# Patient Record
Sex: Female | Born: 1935 | Race: White | Hispanic: No | Smoking: Never smoker
Health system: Southern US, Community
[De-identification: ages and names within clinical notes are randomized; demographics above are authoritative.]

## PROBLEM LIST (undated history)

## (undated) DIAGNOSIS — H3530 Unspecified macular degeneration: Secondary | ICD-10-CM

## (undated) DIAGNOSIS — C4492 Squamous cell carcinoma of skin, unspecified: Secondary | ICD-10-CM

## (undated) DIAGNOSIS — M810 Age-related osteoporosis without current pathological fracture: Secondary | ICD-10-CM

## (undated) DIAGNOSIS — E785 Hyperlipidemia, unspecified: Secondary | ICD-10-CM

## (undated) DIAGNOSIS — Z9849 Cataract extraction status, unspecified eye: Secondary | ICD-10-CM

## (undated) DIAGNOSIS — G4733 Obstructive sleep apnea (adult) (pediatric): Secondary | ICD-10-CM

## (undated) DIAGNOSIS — K219 Gastro-esophageal reflux disease without esophagitis: Secondary | ICD-10-CM

## (undated) DIAGNOSIS — I10 Essential (primary) hypertension: Secondary | ICD-10-CM

## (undated) DIAGNOSIS — M545 Low back pain: Secondary | ICD-10-CM

## (undated) DIAGNOSIS — R739 Hyperglycemia, unspecified: Secondary | ICD-10-CM

## (undated) DIAGNOSIS — N3281 Overactive bladder: Secondary | ICD-10-CM

## (undated) HISTORY — PX: TONSILLECTOMY: SHX5217

## (undated) HISTORY — DX: Gastro-esophageal reflux disease without esophagitis: K21.9

## (undated) HISTORY — DX: Age-related osteoporosis without current pathological fracture: M81.0

## (undated) HISTORY — DX: Essential (primary) hypertension: I10

## (undated) HISTORY — DX: Obstructive sleep apnea (adult) (pediatric): G47.33

## (undated) HISTORY — DX: Squamous cell carcinoma of skin, unspecified: C44.92

## (undated) HISTORY — DX: Hyperlipidemia, unspecified: E78.5

## (undated) HISTORY — DX: Overactive bladder: N32.81

## (undated) HISTORY — DX: Unspecified macular degeneration: H35.30

## (undated) HISTORY — DX: Low back pain: M54.5

## (undated) HISTORY — DX: Cataract extraction status, unspecified eye: Z98.49

## (undated) HISTORY — DX: Hyperglycemia, unspecified: R73.9

---

## 1959-08-30 HISTORY — PX: APPENDECTOMY: SHX54

## 1959-08-30 HISTORY — PX: ABDOMINAL HYSTERECTOMY: SHX81

## 1998-12-29 HISTORY — PX: MOLE REMOVAL: SHX2046

## 2015-01-29 DIAGNOSIS — C4492 Squamous cell carcinoma of skin, unspecified: Secondary | ICD-10-CM

## 2015-01-29 HISTORY — DX: Squamous cell carcinoma of skin, unspecified: C44.92

## 2015-11-01 DIAGNOSIS — N3941 Urge incontinence: Secondary | ICD-10-CM | POA: Diagnosis not present

## 2015-11-01 DIAGNOSIS — R1013 Epigastric pain: Secondary | ICD-10-CM | POA: Diagnosis not present

## 2015-11-01 DIAGNOSIS — N8111 Cystocele, midline: Secondary | ICD-10-CM | POA: Diagnosis not present

## 2015-11-29 ENCOUNTER — Encounter: Payer: Self-pay | Admitting: Nurse Practitioner

## 2015-11-29 ENCOUNTER — Non-Acute Institutional Stay: Payer: Medicare Other | Admitting: Nurse Practitioner

## 2015-11-29 VITALS — BP 138/68 | HR 79 | Temp 98.1°F | Ht 65.0 in | Wt 173.0 lb

## 2015-11-29 DIAGNOSIS — F5105 Insomnia due to other mental disorder: Secondary | ICD-10-CM | POA: Diagnosis not present

## 2015-11-29 DIAGNOSIS — K219 Gastro-esophageal reflux disease without esophagitis: Secondary | ICD-10-CM | POA: Diagnosis not present

## 2015-11-29 DIAGNOSIS — N3281 Overactive bladder: Secondary | ICD-10-CM

## 2015-11-29 DIAGNOSIS — R269 Unspecified abnormalities of gait and mobility: Secondary | ICD-10-CM

## 2015-11-29 DIAGNOSIS — F419 Anxiety disorder, unspecified: Secondary | ICD-10-CM

## 2015-11-29 DIAGNOSIS — I10 Essential (primary) hypertension: Secondary | ICD-10-CM | POA: Diagnosis not present

## 2015-12-04 DIAGNOSIS — F5105 Insomnia due to other mental disorder: Secondary | ICD-10-CM

## 2015-12-04 DIAGNOSIS — F419 Anxiety disorder, unspecified: Secondary | ICD-10-CM | POA: Insufficient documentation

## 2015-12-04 DIAGNOSIS — I10 Essential (primary) hypertension: Secondary | ICD-10-CM | POA: Insufficient documentation

## 2015-12-04 DIAGNOSIS — K219 Gastro-esophageal reflux disease without esophagitis: Secondary | ICD-10-CM | POA: Insufficient documentation

## 2015-12-04 DIAGNOSIS — N3281 Overactive bladder: Secondary | ICD-10-CM | POA: Insufficient documentation

## 2015-12-04 DIAGNOSIS — R269 Unspecified abnormalities of gait and mobility: Secondary | ICD-10-CM | POA: Insufficient documentation

## 2015-12-04 NOTE — Assessment & Plan Note (Signed)
Blood pressure is controlled, taking Valsartan HCT 160/12.5mg  daily, taking Mega 3 and ASA 325mg  for CV risk reduction, update lipid panel, CMP, Hgb a1c, TSH

## 2015-12-04 NOTE — Assessment & Plan Note (Signed)
Discourage Tylenol pm use, PT to eval and tx

## 2015-12-04 NOTE — Assessment & Plan Note (Signed)
Occasionally difficulty of falling and maintaining asleep, past Tylenol PM use associated with dizziness and wobbling gait/balance issue, recommend Lorazepam prn instead of Tylenol PM, observe the patient.

## 2015-12-04 NOTE — Assessment & Plan Note (Signed)
Urinary frequency, wearing pessary, ? Cystocele, Urology referral, continue Oxybutynin.

## 2015-12-04 NOTE — Progress Notes (Signed)
Patient ID: Jean [name], female   DOB: 1924/05/23, 81 y.o.   MRN: [id number]  Location:  clinic [place] Provider:  Marlana Latus NP  Code Status:  DNR Goals of care: Advanced Directive information Does patient have an advance directive?: Yes, Type of Advance Directive: [place];Living will  Chief Complaint  Patient presents with  . Medical Management of Chronic Issues    New Patient to get est with [facility]     HPI: Patient is a 81 y.o. female seen in the clinic at [facility] today for evaluation of gait, stated she feels off balance, wobbly, unsteady in her feet at times. Moved into [facility] [place] due to lack of self sufficiency managing living in her house independently. Hx of hysterectomy, pessary for ? Cystocele, the patient needs referral for Urology.   Review of Systems  Constitutional: Negative for fever, weight loss, malaise/fatigue and diaphoresis.  HENT: Positive for hearing loss. Negative for congestion, ear discharge, ear pain, nosebleeds, sore throat and tinnitus.        Mild  Eyes: Negative for blurred vision, double vision, photophobia, pain, discharge and redness.  Respiratory: Negative for cough, hemoptysis, sputum production, shortness of breath, wheezing and stridor.   Cardiovascular: Negative for chest pain, palpitations, orthopnea, claudication, leg swelling and PND.  Gastrointestinal: Positive for heartburn and constipation. Negative for nausea, vomiting, abdominal pain, diarrhea, blood in stool and melena.  Genitourinary: Positive for frequency. Negative for dysuria, urgency, hematuria and flank pain.       Urinary frequency, taking Oxybutynin, using pessary.   Musculoskeletal: Positive for back pain. Negative for myalgias, joint pain, falls and neck pain.  Skin: Negative for itching and rash.  Neurological: Positive for dizziness. Negative for tingling, tremors, sensory change, speech change, focal weakness, seizures, loss of consciousness, weakness  and headaches.       May associated with Tylenol PM use  Endo/Heme/Allergies: Negative for environmental allergies and polydipsia. Bruises/bleeds easily.       Taking ASA 325mg  daily.   Psychiatric/Behavioral: Negative for depression, suicidal ideas, hallucinations, memory loss and substance abuse. The patient is nervous/anxious and has insomnia.        Recommended to take prn Lorazepam instead of Tylenol PM in setting of c/o dizziness and only occasionally needed sleeping aid.     Past Medical History  Diagnosis Date  . Hypertension   . GERD (gastroesophageal reflux disease)   . Hyperlipidemia   . Overactive bladder   . Obstructive sleep apnea   . Macular degeneration     ? which eye(s)  . Osteoporosis, senile   . Squamous cell skin cancer 2/16    left lower leg  . Hx of cataract surgery     both eyes  . Hyperglycemia     Patient Active Problem List   Diagnosis Date Noted  . HTN (hypertension) 12/04/2015  . 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 GERD (gastroesophageal reflux disease) 12/04/2015  . Overactive bladder 12/04/2015  . Abnormality of gait 12/04/2015    Allergies  Allergen Reactions  . Nitrofuran Derivatives     Medications: Patient's Medications  New Prescriptions   No medications on file  Previous Medications   ASPIRIN EC 325 MG TABLET    Take 325 mg by mouth daily.   CALCIUM CARBONATE (TUMS - DOSED IN MG ELEMENTAL CALCIUM) 500 MG CHEWABLE TABLET    Chew 1 tablet by mouth. 2 tablets as needed for acid indigestion   CARBOXYMETHYLCELL-HYPROMELLOSE (GENTEAL) 0.25-0.3 % GEL  Apply to eye. Apply to eyes at bedtime for dry eyes   LORAZEPAM (ATIVAN) 0.5 MG TABLET    Take 0.5 mg by mouth. Take one tablet daily as needed for anxiety   MULTIPLE VITAMINS-MINERALS (ICAPS LUTEIN & ZEAXANTHIN PO)    Take by mouth. Take one tablet daily   NON FORMULARY    Biospec specific biological formulas supplement take 2 tablets once daily with meal   OMEGA-3 1400 MG CAPS     Take by mouth. Take three tablets daily   OMEPRAZOLE (PRILOSEC) 40 MG CAPSULE    Take 40 mg by mouth. Take one capsule 1/2 hour before breakfast for stomach acid   OXYBUTYNIN (DITROPAN) 5 MG TABLET    Take 5 mg by mouth. Take one tablet at bedtime for bladder   POLYETHYL GLYCOL-PROPYL GLYCOL (SYSTANE) 0.4-0.3 % SOLN    Apply to eye. One drop in each eye for dry eyes   RANITIDINE (ZANTAC) 150 MG TABLET    Take 150 mg by mouth. Take one tablet every 12 hours as needed for stomach   VALSARTAN-HYDROCHLOROTHIAZIDE (DIOVAN-HCT) 160-12.5 MG TABLET    Take 1 tablet by mouth daily. For blood pressure  Modified Medications   No medications on file  Discontinued Medications   No medications on file    Physical Exam: Filed Vitals:   11/29/15 1609  BP: 138/68  Pulse: 79  Temp: 98.1 F (36.7 C)  TempSrc: Oral  Height: 5\' 5"  (1.651 m)  Weight: 173 lb (78.472 kg)  SpO2: 96%   Body mass index is 28.79 kg/(m^2).  Physical Exam  Constitutional: She is oriented to person, place, and time. She appears well-developed and well-nourished. No distress.  HENT:  Head: Normocephalic and atraumatic.  Right Ear: External ear normal.  Left Ear: External ear normal.  Nose: Nose normal.  Mouth/Throat: Oropharynx is clear and moist.  Eyes: EOM are normal. Pupils are equal, round, and reactive to light. Right eye exhibits no discharge. Left eye exhibits no discharge. No scleral icterus.  Neck: Normal range of motion. Neck supple. No JVD present. No tracheal deviation present. No thyromegaly present.  Cardiovascular: Normal rate, regular rhythm, normal heart sounds and intact distal pulses.  Exam reveals no gallop and no friction rub.   No murmur heard. Pulmonary/Chest: No stridor. No respiratory distress. She has no wheezes. She has no rales. She exhibits no tenderness.  Abdominal: She exhibits no distension and no mass. There is no tenderness. There is no rebound and no guarding. No hernia.  Genitourinary:    Hx of hysterectomy Chronic pessary use  Musculoskeletal: She exhibits no edema or tenderness.  Lymphadenopathy:    She has no cervical adenopathy.  Neurological: She is alert and oriented to person, place, and time. She has normal reflexes. She displays normal reflexes. No cranial nerve deficit. She exhibits normal muscle tone. Coordination normal.  Skin: Skin is warm and dry. No rash noted. She is not diaphoretic. No erythema. No pallor.  Psychiatric: She has a normal mood and affect. Her behavior is normal. Judgment and thought content normal.  Occasional difficulty falling and maintaining asleep    Labs reviewed: Basic Metabolic Panel: No results for input(s): NA, K, CL, CO2, GLUCOSE, BUN, CREATININE, CALCIUM, MG, PHOS in the last 8760 hours.  Liver Function Tests: No results for input(s): AST, ALT, ALKPHOS, BILITOT, PROT, ALBUMIN in the last 8760 hours.  CBC: No results for input(s): WBC, NEUTROABS, HGB, HCT, MCV, PLT in the last 8760 hours.  No results  found for: TSH No results found for: HGBA1C No results found for: CHOL, HDL, LDLCALC, LDLDIRECT, TRIG, CHOLHDL  Significant Diagnostic Results since last visit: none  Patient Care Team: Estill Dooms, MD as PCP - General (Internal Medicine) Karelyn Brisby X, NP as Nurse Practitioner (Internal Medicine)  Assessment/Plan Problem List Items Addressed This Visit    HTN (hypertension) - Primary    Blood pressure is controlled, taking Valsartan HCT 160/12.5mg  daily, taking Mega 3 and ASA 325mg  for CV risk reduction, update lipid panel, CMP, Hgb a1c, TSH      Relevant Medications   valsartan-hydrochlorothiazide (DIOVAN-HCT) 160-12.5 MG tablet   aspirin EC 325 MG tablet   Insomnia secondary to anxiety    Occasionally difficulty of falling and maintaining asleep, past Tylenol PM use associated with dizziness and wobbling gait/balance issue, recommend Lorazepam prn instead of Tylenol PM, observe the patient.       Relevant  Medications   LORazepam (ATIVAN) 0.5 MG tablet   GERD (gastroesophageal reflux disease)    Asymptomatic, continue Omeprazole and prn Ranitidine.       Relevant Medications   omeprazole (PRILOSEC) 40 MG capsule   ranitidine (ZANTAC) 150 MG tablet   calcium carbonate (TUMS - DOSED IN MG ELEMENTAL CALCIUM) 500 MG chewable tablet   Overactive bladder    Urinary frequency, wearing pessary, ? Cystocele, Urology referral, continue Oxybutynin.       Abnormality of gait    Discourage Tylenol pm use, PT to eval and tx           Family/ staff Communication: PT to eval and tx for balance, gait, strengthening, therapeutic exercise.  Urology referral.   Labs/tests ordered: CBC, CMP, lipid panel, Hgb A1c, TSH  ManXie Jashua Knaak NP [facility] [address], [place] [address] On Call:  [phone] & follow prompts after 5pm & weekends Office Phone:  [phone] Office Fax:  [phone]

## 2015-12-04 NOTE — Assessment & Plan Note (Signed)
Asymptomatic, continue Omeprazole and prn Ranitidine.

## 2015-12-10 ENCOUNTER — Other Ambulatory Visit: Payer: Self-pay | Admitting: *Deleted

## 2015-12-27 ENCOUNTER — Other Ambulatory Visit: Payer: Self-pay | Admitting: *Deleted

## 2015-12-27 MED ORDER — OMEPRAZOLE 40 MG PO CPDR: DELAYED_RELEASE_CAPSULE | ORAL | Status: DC

## 2015-12-27 NOTE — Telephone Encounter (Signed)
Patient Requested to be faxed to pharmacy. 

## 2016-01-07 DIAGNOSIS — M545 Low back pain: Secondary | ICD-10-CM | POA: Diagnosis not present

## 2016-01-07 DIAGNOSIS — R2681 Unsteadiness on feet: Secondary | ICD-10-CM | POA: Diagnosis not present

## 2016-01-07 DIAGNOSIS — M6281 Muscle weakness (generalized): Secondary | ICD-10-CM | POA: Diagnosis not present

## 2016-01-15 ENCOUNTER — Telehealth: Payer: Self-pay | Admitting: *Deleted

## 2016-01-15 MED ORDER — OMEPRAZOLE 40 MG PO CPDR: DELAYED_RELEASE_CAPSULE | ORAL | Status: DC

## 2016-01-15 NOTE — Telephone Encounter (Signed)
I have not seen this patient. Schedule appointment for her to see me. Forward these questions to [facility].

## 2016-01-15 NOTE — Telephone Encounter (Signed)
Patient called requesting medication refills. Returned patient's call and she stated that she will have to call me back. Awaiting call.

## 2016-01-15 NOTE — Telephone Encounter (Signed)
Appointment scheduled with Dr Nyoka Cowden 03/27/16. Forwarded to [facility]

## 2016-01-15 NOTE — Telephone Encounter (Signed)
Patient called and wanted Omeprazole faxed to [facility]. Faxed. Also, Patient is out of the OTC vitamins that another Dr. Recommended with CoQ10 in it and the Bio Calcium and she had purchased these at his office. Since she is out of them and no longer goes to that doctor she wants to know what you would recommend OTC. Please Advise.

## 2016-01-30 DIAGNOSIS — M6281 Muscle weakness (generalized): Secondary | ICD-10-CM | POA: Diagnosis not present

## 2016-01-30 DIAGNOSIS — M545 Low back pain: Secondary | ICD-10-CM | POA: Diagnosis not present

## 2016-01-30 DIAGNOSIS — R2681 Unsteadiness on feet: Secondary | ICD-10-CM | POA: Diagnosis not present

## 2016-02-01 DIAGNOSIS — G4733 Obstructive sleep apnea (adult) (pediatric): Secondary | ICD-10-CM | POA: Diagnosis not present

## 2016-02-21 ENCOUNTER — Telehealth: Payer: Self-pay

## 2016-02-21 DIAGNOSIS — R350 Frequency of micturition: Secondary | ICD-10-CM | POA: Diagnosis not present

## 2016-02-21 DIAGNOSIS — N8110 Cystocele, unspecified: Secondary | ICD-10-CM | POA: Diagnosis not present

## 2016-02-21 MED ORDER — OXYBUTYNIN CHLORIDE 5 MG PO TABS: ORAL_TABLET | ORAL | Status: DC

## 2016-02-21 MED ORDER — VALSARTAN-HYDROCHLOROTHIAZIDE 160-12.5 MG PO TABS: 1.0000 | ORAL_TABLET | Freq: Every day | ORAL | Status: DC

## 2016-02-21 NOTE — Telephone Encounter (Signed)
Received phone call from [facility] clinic nurse, patient needs refills on Oxybutynin and Valsartan to [facility].

## 2016-02-28 DIAGNOSIS — M6281 Muscle weakness (generalized): Secondary | ICD-10-CM | POA: Diagnosis not present

## 2016-02-28 DIAGNOSIS — M545 Low back pain: Secondary | ICD-10-CM | POA: Diagnosis not present

## 2016-02-28 DIAGNOSIS — R2681 Unsteadiness on feet: Secondary | ICD-10-CM | POA: Diagnosis not present

## 2016-03-18 DIAGNOSIS — E785 Hyperlipidemia, unspecified: Secondary | ICD-10-CM | POA: Diagnosis not present

## 2016-03-18 DIAGNOSIS — R739 Hyperglycemia, unspecified: Secondary | ICD-10-CM | POA: Diagnosis not present

## 2016-03-18 DIAGNOSIS — I10 Essential (primary) hypertension: Secondary | ICD-10-CM | POA: Diagnosis not present

## 2016-03-18 LAB — BASIC METABOLIC PANEL
BUN: 28 mg/dL — AB (ref 4–21)
CREATININE: 0.8 mg/dL (ref 0.5–1.1)
Glucose: 98 mg/dL
Potassium: 4 mmol/L (ref 3.4–5.3)
SODIUM: 141 mmol/L (ref 137–147)

## 2016-03-18 LAB — HEPATIC FUNCTION PANEL
ALK PHOS: 65 U/L (ref 25–125)
ALT: 19 U/L (ref 7–35)
AST: 26 U/L (ref 13–35)
Bilirubin, Total: 0.6 mg/dL

## 2016-03-18 LAB — CBC AND DIFFERENTIAL
HEMATOCRIT: 35 % — AB (ref 36–46)
Hemoglobin: 12.1 g/dL (ref 12.0–16.0)
PLATELETS: 202 10*3/uL (ref 150–399)
WBC: 6.7 10^3/mL

## 2016-03-18 LAB — LIPID PANEL
Cholesterol: 187 mg/dL (ref 0–200)
HDL: 53 mg/dL (ref 35–70)
LDL CALC: 121 mg/dL
TRIGLYCERIDES: 66 mg/dL (ref 40–160)

## 2016-03-18 LAB — HEMOGLOBIN A1C: HEMOGLOBIN A1C: 5.7

## 2016-03-27 ENCOUNTER — Encounter: Payer: Self-pay | Admitting: Internal Medicine

## 2016-03-27 ENCOUNTER — Non-Acute Institutional Stay: Payer: PPO | Admitting: Internal Medicine

## 2016-03-27 VITALS — BP 152/74 | Ht 65.0 in | Wt 178.0 lb

## 2016-03-27 DIAGNOSIS — N3281 Overactive bladder: Secondary | ICD-10-CM | POA: Diagnosis not present

## 2016-03-27 DIAGNOSIS — I10 Essential (primary) hypertension: Secondary | ICD-10-CM

## 2016-03-27 DIAGNOSIS — E785 Hyperlipidemia, unspecified: Secondary | ICD-10-CM | POA: Diagnosis not present

## 2016-03-27 DIAGNOSIS — M545 Low back pain: Secondary | ICD-10-CM

## 2016-03-27 DIAGNOSIS — G4733 Obstructive sleep apnea (adult) (pediatric): Secondary | ICD-10-CM | POA: Diagnosis not present

## 2016-03-27 DIAGNOSIS — F419 Anxiety disorder, unspecified: Secondary | ICD-10-CM

## 2016-03-27 DIAGNOSIS — G8929 Other chronic pain: Secondary | ICD-10-CM

## 2016-03-27 DIAGNOSIS — F5105 Insomnia due to other mental disorder: Secondary | ICD-10-CM | POA: Diagnosis not present

## 2016-03-27 NOTE — Progress Notes (Signed)
Patient ID: Jean [name], female   DOB: 15-Jun-1924, 82 y.o.   MRN: [id number]  Provider:  Jeanmarie Hubert, M.D. Location:  [place] of Service:  Clinic (12)  PCP: Estill Dooms, MD Patient Care Team: Estill Dooms, MD as PCP - General (Internal Medicine) Man Mast Rhunette Croft, NP as Nurse Practitioner (Internal Medicine)  Extended Emergency Contact Information Primary Emergency Contact: [facility] Address: [address]          [place], [place] [address] Johnnette Litter of [place] Phone: [phone] Mobile Phone: [phone] Relation: Son  Code Status: full Goals of Care: Advanced Directive information Advanced Directives 03/27/2016  Does patient have an advance directive? Yes  Type of Advance Directive Healthcare Power of Attorney  Copy of advanced directive(s) in chart? Yes      Chief Complaint  Patient presents with  . Medical Management of Chronic Issues    medication  management, blood pressure, insomnia, anxiety, gait. New patient follow-up, saw [facility] 11/29/15  . Nasal Congestion    allergies, what should she take  . Back Problem    should she continue with PT or do exercise class?    HPI: Patient is a 82 y.o. female seen today for first visit with me.Patient was seen by Marlana Latus, NP on 11/29/2015. Problems considered at that time included hypertension, insomnia, GERD, overactive bladder, and abnormal gait.  Lower back pains. Had PT sessions. Would like to be considered for more PT. She is not doing any exercises on her own. Was taught Nustep. Has been trying to get her life in order otherwise. Pains come and go. Needs cart to lean on when shopping. Using 4 wheel walker since at lest Jan 2016. Had xrays from a chiropracter.   Moved to [place] from [place]. Has a son in [place].  Hx of skin cancer of cheek and left leg in the past. Got cellulitis requiring hospital stay after the removal from the leg.  Has OSA and uses CPAP since July  2015.  Past Medical History  Diagnosis Date  . Hypertension   . GERD (gastroesophageal reflux disease)   . Hyperlipidemia   . Overactive bladder   . Obstructive sleep apnea   . Macular degeneration     ? which eye(s)  . Osteoporosis, senile   . Squamous cell skin cancer 2/16    left lower leg  . Hx of cataract surgery     both eyes  . Hyperglycemia    Past Surgical History  Procedure Laterality Date  . Abdominal hysterectomy  1960's  . Appendectomy  1960's  . Mole removal  2000    facial  . Tonsillectomy      reports that she has never smoked. She has never used smokeless tobacco. She reports that she drinks alcohol. She reports that she does not use illicit drugs. Social History   Social History  . Marital Status: Widowed    Spouse Name: N/A  . Number of Children: N/A  . Years of Education: N/A   Occupational History  . 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 Smoking status: Never Smoker   . Smokeless tobacco: Never Used  . Alcohol Use: Yes     Comment: occassionally 1/2 glass of wine 1-2 x month  . Drug Use: No  . Sexual Activity: No   Other Topics Concern  . Not on file   Social History Narrative   Lives at [facility] since 11/12/15  Widow   Never smoked   Alcohol occasionally 1/2 glass of wine   Exercise none   POA, Living Will   Walks with walker        Family History  Problem Relation Age of Onset  . Heart disease Mother   . Heart disease Father     Health Maintenance  Topic Date Due  . ZOSTAVAX  01/23/1986  . DEXA SCAN  01/23/1991  . INFLUENZA VACCINE  07/29/2016  . TETANUS/TDAP  12/14/2022  . PNA vac Low Risk Adult  Completed    Allergies  Allergen Reactions  . Adhesive [Tape]   . Nitrofuran Derivatives       Medication List       This list is accurate as of: 03/27/16  3:38 PM.  Always use your most recent med list.               aspirin 325 MG tablet  Take 325 mg by mouth daily.      CALCIUM 600+D3 600-800 MG-UNIT Tabs  Generic drug:  Calcium Carb-Cholecalciferol  Take by mouth. Take one daily     CoQ10 100 MG Caps  Take by mouth. Take one tablet daily     Fish Oil 1200 MG Caps  Take by mouth. Take one daily     ICAPS Caps  Take by mouth. Take one I-Cap daily for eyes     LORazepam 0.5 MG tablet  Commonly known as:  ATIVAN  Take 0.5 mg by mouth. Take one tablet daily as needed for anxiety     multivitamin tablet  Take 1 tablet by mouth daily.     omeprazole 40 MG capsule  Commonly known as:  PRILOSEC  Take one capsule 1/2 hour before breakfast for stomach acid     oxybutynin 5 MG tablet  Commonly known as:  DITROPAN  Take one tablet at bedtime for bladder     SYSTANE 0.4-0.3 % Soln  Generic drug:  Polyethyl Glycol-Propyl Glycol  Apply to eye. One drop both eyes three times daily     SYSTANE 0.4-0.3 % Gel ophthalmic gel  Generic drug:  Polyethyl Glycol-Propyl Glycol  Place 1 application into both eyes. Apply to eye lids at bedtime     valsartan-hydrochlorothiazide 160-12.5 MG tablet  Commonly known as:  DIOVAN-HCT  Take 1 tablet by mouth daily. For blood pressure        Review of Systems  Constitutional: Negative for fever, chills, diaphoresis, activity change, appetite change, fatigue and unexpected weight change.  HENT: Negative for congestion, ear discharge, ear pain, hearing loss, postnasal drip, rhinorrhea, sore throat, tinnitus, trouble swallowing and voice change.   Eyes: Negative for pain, redness, itching and visual disturbance.       Macular degeneration  Respiratory: Negative for cough, choking, shortness of breath and wheezing.        History of obstructive sleep apnea  Cardiovascular: Negative for chest pain, palpitations and leg swelling.  Gastrointestinal: Negative for nausea, abdominal pain, diarrhea, constipation and abdominal distention.       History GERD and reflux  Endocrine: Negative for cold intolerance, heat intolerance,  polydipsia, polyphagia and polyuria.  Genitourinary: Negative for dysuria, urgency, frequency, hematuria, flank pain, vaginal discharge, difficulty urinating and pelvic pain.       Incontinent of urine. Has pessary.  Musculoskeletal: Positive for gait problem. Negative for myalgias, back pain, arthralgias, neck pain and neck stiffness.  Skin: Negative for color change, pallor and rash.  Allergic/Immunologic: Negative.  Neurological: Negative for dizziness, tremors, seizures, syncope, weakness, numbness and headaches.  Hematological: Negative for adenopathy. Does not bruise/bleed easily.  Psychiatric/Behavioral: Positive for sleep disturbance (insomnia). Negative for suicidal ideas, hallucinations, behavioral problems, confusion, dysphoric mood and agitation. The patient is nervous/anxious. The patient is not hyperactive.     Filed Vitals:   03/27/16 1527  BP: 152/74  Height: 5\' 5"  (1.651 m)  Weight: 178 lb (80.74 kg)   Body mass index is 29.62 kg/(m^2). Physical Exam  Constitutional: She is oriented to person, place, and time. She appears well-developed and well-nourished. No distress.  HENT:  Right Ear: External ear normal.  Left Ear: External ear normal.  Nose: Nose normal.  Mouth/Throat: Oropharynx is clear and moist. No oropharyngeal exudate.  Eyes: Conjunctivae and EOM are normal. Pupils are equal, round, and reactive to light. No scleral icterus.  Neck: No JVD present. No tracheal deviation present. No thyromegaly present.  Cardiovascular: Normal rate, regular rhythm, normal heart sounds and intact distal pulses.  Exam reveals no gallop and no friction rub.   No murmur heard. Pulmonary/Chest: Effort normal. No respiratory distress. She has no wheezes. She has no rales. She exhibits no tenderness.  Abdominal: She exhibits no distension and no mass. There is no tenderness.  Musculoskeletal: Normal range of motion. She exhibits no edema or tenderness.  Tender in lower  back. Unstable gait  Lymphadenopathy:    She has no cervical adenopathy.  Neurological: She is alert and oriented to person, place, and time. No cranial nerve deficit. Coordination normal.  Skin: No rash noted. She is not diaphoretic. No erythema. No pallor.  Psychiatric: She has a normal mood and affect. Her behavior is normal. Judgment and thought content normal.    Labs reviewed: Basic Metabolic Panel:  Recent Labs  03/18/16  NA 141  K 4.0  BUN 28*  CREATININE 0.8   Liver Function Tests:  Recent Labs  03/18/16  AST 26  ALT 19  ALKPHOS 65   No results for input(s): LIPASE, AMYLASE in the last 8760 hours. No results for input(s): AMMONIA in the last 8760 hours. CBC:  Recent Labs  03/18/16  WBC 6.7  HGB 12.1  HCT 35*  PLT 202   Cardiac Enzymes: No results for input(s): CKTOTAL, CKMB, CKMBINDEX, TROPONINI in the last 8760 hours. BNP: Invalid input(s): POCBNP Lab Results  Component Value Date   HGBA1C 5.7 03/18/2016     Assessment/Plan 1. Chronic midline low back pain without sciatica I recommended continuation of physical therapy.  2. Obstructive sleep apnea Continue CPAP  3. Hyperlipidemia -Lipid panel, future  4. Overactive bladder Has appointment with [facility]  5. Insomnia secondary to anxiety Try melatonin 3 mg at bedtime  6. Essential hypertension Although there is a modest elevation in systolic blood pressure today, I elected not to change medications. This may be attributable to see me on the first time basis today re-evaluate next visit.

## 2016-04-02 DIAGNOSIS — M545 Low back pain: Secondary | ICD-10-CM | POA: Diagnosis not present

## 2016-04-02 DIAGNOSIS — M6281 Muscle weakness (generalized): Secondary | ICD-10-CM | POA: Diagnosis not present

## 2016-04-02 DIAGNOSIS — R2681 Unsteadiness on feet: Secondary | ICD-10-CM | POA: Diagnosis not present

## 2016-04-02 DIAGNOSIS — N8110 Cystocele, unspecified: Secondary | ICD-10-CM | POA: Diagnosis not present

## 2016-04-05 ENCOUNTER — Encounter: Payer: Self-pay | Admitting: Internal Medicine

## 2016-04-05 DIAGNOSIS — M545 Low back pain, unspecified: Secondary | ICD-10-CM | POA: Insufficient documentation

## 2016-04-05 DIAGNOSIS — E785 Hyperlipidemia, unspecified: Secondary | ICD-10-CM | POA: Insufficient documentation

## 2016-04-05 DIAGNOSIS — G4733 Obstructive sleep apnea (adult) (pediatric): Secondary | ICD-10-CM | POA: Insufficient documentation

## 2016-04-05 HISTORY — DX: Low back pain, unspecified: M54.50

## 2016-04-09 NOTE — Addendum Note (Signed)
Addended by: Estill Dooms on: 04/09/2016 05:06 PM   Modules accepted: Level of Service

## 2016-04-17 ENCOUNTER — Encounter: Payer: Self-pay | Admitting: Nurse Practitioner

## 2016-04-17 ENCOUNTER — Non-Acute Institutional Stay (SKILLED_NURSING_FACILITY): Payer: PPO | Admitting: Nurse Practitioner

## 2016-04-17 VITALS — BP 124/72 | HR 94 | Temp 98.0°F | Ht 65.0 in | Wt 176.8 lb

## 2016-04-17 DIAGNOSIS — F419 Anxiety disorder, unspecified: Secondary | ICD-10-CM

## 2016-04-17 DIAGNOSIS — N3281 Overactive bladder: Secondary | ICD-10-CM | POA: Diagnosis not present

## 2016-04-17 DIAGNOSIS — I10 Essential (primary) hypertension: Secondary | ICD-10-CM

## 2016-04-17 DIAGNOSIS — F5105 Insomnia due to other mental disorder: Secondary | ICD-10-CM

## 2016-04-17 DIAGNOSIS — R531 Weakness: Secondary | ICD-10-CM | POA: Insufficient documentation

## 2016-04-17 DIAGNOSIS — R5383 Other fatigue: Secondary | ICD-10-CM | POA: Diagnosis not present

## 2016-04-17 DIAGNOSIS — K219 Gastro-esophageal reflux disease without esophagitis: Secondary | ICD-10-CM | POA: Diagnosis not present

## 2016-04-17 NOTE — Assessment & Plan Note (Signed)
Stable, continue Omeprazole 40mg daily.  

## 2016-04-17 NOTE — Progress Notes (Signed)
Patient ID: Jean [name], female   DOB: Feb 19, 1924, 82 y.o.   MRN: [id number]   Location:  [facility] (12) Provider: Marlana Latus NP  Code Status: DNR Goals of Care:  Advanced Directives 04/17/2016  Does patient have an advance directive? Yes  Type of Advance Directive Healthcare Power of Attorney  Copy of advanced directive(s) in chart? Yes     Chief Complaint  Patient presents with  . Acute Visit    patient states she feel sick and weak  also sweating alot    HPI: Patient is a 82 y.o. female seen today for an acute visit for quick onset fatigue, nausea, weakness, sweating, anxiety, recent changed pessary at Urology, afebrile, denied abd pain or dysuria, admitted urinary frequency. Hx of overactive bladder, taking Ditropan 5mg  daily. Blood pressure is controlled on Diovan-Hct 160/12.5mg . GERD stable on Omeprazole 40mg  daily.   Past Medical History  Diagnosis Date  . Hypertension   . GERD (gastroesophageal reflux disease)   . Hyperlipidemia   . Overactive bladder   . Obstructive sleep apnea   . Macular degeneration     ? which eye(s)  . Osteoporosis, senile   . Squamous cell skin cancer 2/16    left lower leg  . Hx of cataract surgery     both eyes  . Hyperglycemia   . Lumbago 04/05/2016    Past Surgical History  Procedure Laterality Date  . Abdominal hysterectomy  1960's  . Appendectomy  1960's  . Mole removal  2000    facial  . Tonsillectomy      Allergies  Allergen Reactions  . Adhesive [Tape]   . Nitrofuran Derivatives       Medication List       This list is accurate as of: 04/17/16  3:27 PM.  Always use your most recent med list.               aspirin 325 MG tablet  Take 325 mg by mouth daily.     CALCIUM 600+D3 600-800 MG-UNIT Tabs  Generic drug:  Calcium Carb-Cholecalciferol  Take by mouth. Take one daily     CoQ10 100 MG Caps  Take by mouth. Take one tablet daily     Fish Oil 1200 MG Caps  Take by  mouth. Take one daily     ICAPS Caps  Take by mouth. Take one I-Cap daily for eyes     LORazepam 0.5 MG tablet  Commonly known as:  ATIVAN  Take 0.5 mg by mouth. Take one tablet daily as needed for anxiety     multivitamin tablet  Take 1 tablet by mouth daily.     omeprazole 40 MG capsule  Commonly known as:  PRILOSEC  Take one capsule 1/2 hour before breakfast for stomach acid     oxybutynin 5 MG tablet  Commonly known as:  DITROPAN  Take one tablet at bedtime for bladder     SYSTANE 0.4-0.3 % Soln  Generic drug:  Polyethyl Glycol-Propyl Glycol  Apply to eye. One drop both eyes three times daily     SYSTANE 0.4-0.3 % Gel ophthalmic gel  Generic drug:  Polyethyl Glycol-Propyl Glycol  Place 1 application into both eyes. Apply to eye lids at bedtime     valsartan-hydrochlorothiazide 160-12.5 MG tablet  Commonly known as:  DIOVAN-HCT  Take 1 tablet by mouth daily. For blood pressure        Review of Systems:  Review  of Systems  Constitutional: Negative for fever, chills, diaphoresis, activity change, appetite change, fatigue and unexpected weight change.       Fatigue, nausea, weakness, sweating, anxiety  HENT: Negative for congestion, ear discharge, ear pain, hearing loss, postnasal drip, rhinorrhea, sore throat, tinnitus, trouble swallowing and voice change.   Eyes: Negative for pain, redness, itching and visual disturbance.       Macular degeneration  Respiratory: Negative for cough, choking, shortness of breath and wheezing.        History of obstructive sleep apnea  Cardiovascular: Negative for chest pain, palpitations and leg swelling.  Gastrointestinal: Negative for nausea, abdominal pain, diarrhea, constipation and abdominal distention.       History GERD and reflux  Endocrine: Negative for cold intolerance, heat intolerance, polydipsia, polyphagia and polyuria.  Genitourinary: Negative for dysuria, urgency, frequency, hematuria, flank pain, vaginal discharge,  difficulty urinating and pelvic pain.       Incontinent of urine. Has pessary.  Musculoskeletal: Positive for gait problem. Negative for myalgias, back pain, arthralgias, neck pain and neck stiffness.  Skin: Negative for color change, pallor and rash.  Allergic/Immunologic: Negative.   Neurological: Negative for dizziness, tremors, seizures, syncope, weakness, numbness and headaches.  Hematological: Negative for adenopathy. Does not bruise/bleed easily.  Psychiatric/Behavioral: Positive for sleep disturbance (insomnia). Negative for suicidal ideas, hallucinations, behavioral problems, confusion, dysphoric mood and agitation. The patient is nervous/anxious. The patient is not hyperactive.     Health Maintenance  Topic Date Due  . ZOSTAVAX  01/23/1986  . DEXA SCAN  01/23/1991  . INFLUENZA VACCINE  07/29/2016  . TETANUS/TDAP  12/14/2022  . PNA vac Low Risk Adult  Completed    Physical Exam: Filed Vitals:   04/17/16 1458  BP: 124/72  Pulse: 94  Temp: 98 F (36.7 C)  TempSrc: Oral  Height: 5\' 5"  (1.651 m)  Weight: 176 lb 12.8 oz (80.196 kg)  SpO2: 96%   Body mass index is 29.42 kg/(m^2). Physical Exam  Constitutional: She is oriented to person, place, and time. She appears well-developed and well-nourished. No distress.  HENT:  Right Ear: External ear normal.  Left Ear: External ear normal.  Nose: Nose normal.  Mouth/Throat: Oropharynx is clear and moist. No oropharyngeal exudate.  Eyes: Conjunctivae and EOM are normal. Pupils are equal, round, and reactive to light. No scleral icterus.  Neck: No JVD present. No tracheal deviation present. No thyromegaly present.  Cardiovascular: Normal rate, regular rhythm, normal heart sounds and intact distal pulses.  Exam reveals no gallop and no friction rub.   No murmur heard. Pulmonary/Chest: Effort normal. No respiratory distress. She has no wheezes. She has no rales. She exhibits no tenderness.  Abdominal: She exhibits no distension  and no mass. There is no tenderness.  Musculoskeletal: Normal range of motion. She exhibits no edema or tenderness.  Tender in lower back. Unstable gait  Lymphadenopathy:    She has no cervical adenopathy.  Neurological: She is alert and oriented to person, place, and time. No cranial nerve deficit. Coordination normal.  Skin: No rash noted. She is not diaphoretic. No erythema. No pallor.  Psychiatric: She has a normal mood and affect. Her behavior is normal. Judgment and thought content normal.    Labs reviewed: Basic Metabolic Panel:  Recent Labs  03/18/16  NA 141  K 4.0  BUN 28*  CREATININE 0.8   Liver Function Tests:  Recent Labs  03/18/16  AST 26  ALT 19  ALKPHOS 65   No results for  input(s): LIPASE, AMYLASE in the last 8760 hours. No results for input(s): AMMONIA in the last 8760 hours. CBC:  Recent Labs  03/18/16  WBC 6.7  HGB 12.1  HCT 35*  PLT 202   Lipid Panel:  Recent Labs  03/18/16  CHOL 187  HDL 53  LDLCALC 121  TRIG 66   Lab Results  Component Value Date   HGBA1C 5.7 03/18/2016    Procedures since last visit: No results found.  Assessment/Plan GERD (gastroesophageal reflux disease) Stable, continue Omeprazole 40mg  daily  Overactive bladder Urinary frequency, continue Pessary and Ditropan, obtain UA C/S in setting of sudden onset of fatigue, sweating, nausea, anxiety.   Insomnia secondary to anxiety Worsened since onset of fatigue, sweating, nausea. Update CBC, CMP, TSH, continue Lorazepam 0.5mg  prn daily   HTN (hypertension) Blood pressure is controlled, taking Valsartan HCT 160/12.5mg  daily, taking Mega 3 and ASA 325mg  for CV risk reduction, CMP, TSH   Fatigue Sudden onset of fatigue, nausea, sweating, worsened anxiety in setting of recent pessary change and chronic urinary frequency, obtain UA c/s to r/o UTI. Update CBC, CMP, TSH     Labs/tests ordered:  @ORDERS @ CBC, CMP, TSH, UA C/S Next appt:  06/19/2016

## 2016-04-17 NOTE — Assessment & Plan Note (Signed)
Sudden onset of fatigue, nausea, sweating, worsened anxiety in setting of recent pessary change and chronic urinary frequency, obtain UA c/s to r/o UTI. Update CBC, CMP, TSH

## 2016-04-17 NOTE — Assessment & Plan Note (Signed)
Blood pressure is controlled, taking Valsartan HCT 160/12.5mg  daily, taking Mega 3 and ASA 325mg  for CV risk reduction, CMP, TSH

## 2016-04-17 NOTE — Assessment & Plan Note (Signed)
Worsened since onset of fatigue, sweating, nausea. Update CBC, CMP, TSH, continue Lorazepam 0.5mg  prn daily

## 2016-04-17 NOTE — Assessment & Plan Note (Signed)
Urinary frequency, continue Pessary and Ditropan, obtain UA C/S in setting of sudden onset of fatigue, sweating, nausea, anxiety.

## 2016-04-18 DIAGNOSIS — R509 Fever, unspecified: Secondary | ICD-10-CM | POA: Diagnosis not present

## 2016-04-18 DIAGNOSIS — I10 Essential (primary) hypertension: Secondary | ICD-10-CM | POA: Diagnosis not present

## 2016-04-22 ENCOUNTER — Telehealth: Payer: Self-pay

## 2016-04-22 ENCOUNTER — Telehealth: Payer: Self-pay | Admitting: *Deleted

## 2016-04-22 DIAGNOSIS — G4733 Obstructive sleep apnea (adult) (pediatric): Secondary | ICD-10-CM | POA: Diagnosis not present

## 2016-04-22 MED ORDER — CIPROFLOXACIN HCL 500 MG PO TABS: ORAL_TABLET | ORAL | Status: DC

## 2016-04-22 NOTE — Telephone Encounter (Signed)
Received fax from [facility] of patient's Urine Culture Results. Given to Dr. Nyoka Cowden to review and sign.

## 2016-04-22 NOTE — Telephone Encounter (Signed)
Urine culture 100,00 colonies Klebsiella pneumoniae, called home and cell phone, no answer, called [facility] spoke with AL nurse, she will go to patient's apartment and let her know we are faxing Rx Cipro 500mg  twice daily #20 for infection and will leave Apartment Nurse message. Fax to [place] and fax urine culture to [facility] to clinic nurse

## 2016-04-25 DIAGNOSIS — L814 Other melanin hyperpigmentation: Secondary | ICD-10-CM | POA: Diagnosis not present

## 2016-04-25 DIAGNOSIS — L570 Actinic keratosis: Secondary | ICD-10-CM | POA: Diagnosis not present

## 2016-04-25 DIAGNOSIS — D225 Melanocytic nevi of trunk: Secondary | ICD-10-CM | POA: Diagnosis not present

## 2016-04-25 DIAGNOSIS — L918 Other hypertrophic disorders of the skin: Secondary | ICD-10-CM | POA: Diagnosis not present

## 2016-04-25 DIAGNOSIS — L821 Other seborrheic keratosis: Secondary | ICD-10-CM | POA: Diagnosis not present

## 2016-04-28 ENCOUNTER — Telehealth: Payer: Self-pay | Admitting: *Deleted

## 2016-04-28 DIAGNOSIS — M6281 Muscle weakness (generalized): Secondary | ICD-10-CM | POA: Diagnosis not present

## 2016-04-28 DIAGNOSIS — M545 Low back pain: Secondary | ICD-10-CM | POA: Diagnosis not present

## 2016-04-28 DIAGNOSIS — R2681 Unsteadiness on feet: Secondary | ICD-10-CM | POA: Diagnosis not present

## 2016-04-28 NOTE — Telephone Encounter (Signed)
Patient had a positive urine for Klebsiella pneumonia. Cipro was ordered to treat this. 3 days maximum and left to treat it. If she is symptom-free that I think she should not be taking another antibiotic at this time. I'm unable to find the report on the urine in the [facility] flank record. Can we obtain a copy of this?

## 2016-04-28 NOTE — Telephone Encounter (Signed)
Called [facility] and LM on the [place] Nurse's voicemail [phone] that we needed a copy of the urine culture for patient.  Patient notified and awaiting results.

## 2016-04-28 NOTE — Telephone Encounter (Signed)
Patient called and stated that she is having a reaction to Cipro. Stated that she has taken it for 3 days and it is causing her to feel Nausea and a heavy feeling on her chest. The nurses at [facility] instructed her to stop the medication and call the office. Please Advise.  (can leave message on voicemail)

## 2016-04-29 NOTE — Telephone Encounter (Signed)
Fax received with report that we faxed to [facility]. Faxed placed in urgent/time sensitive folder for review

## 2016-04-30 ENCOUNTER — Encounter: Payer: Self-pay | Admitting: Internal Medicine

## 2016-05-01 ENCOUNTER — Encounter: Payer: Self-pay | Admitting: Internal Medicine

## 2016-05-20 DIAGNOSIS — R5383 Other fatigue: Secondary | ICD-10-CM | POA: Diagnosis not present

## 2016-05-20 DIAGNOSIS — I10 Essential (primary) hypertension: Secondary | ICD-10-CM | POA: Diagnosis not present

## 2016-05-20 LAB — BASIC METABOLIC PANEL
BUN: 21 mg/dL (ref 4–21)
Creatinine: 0.8 mg/dL (ref 0.5–1.1)
Glucose: 87 mg/dL
Potassium: 4.2 mmol/L (ref 3.4–5.3)
Sodium: 139 mmol/L (ref 137–147)

## 2016-05-20 LAB — CBC AND DIFFERENTIAL
HEMATOCRIT: 38 % (ref 36–46)
Hemoglobin: 12.2 g/dL (ref 12.0–16.0)
PLATELETS: 194 10*3/uL (ref 150–399)
WBC: 5.7 10*3/mL

## 2016-05-20 LAB — HEPATIC FUNCTION PANEL
ALT: 19 U/L (ref 7–35)
AST: 24 U/L (ref 13–35)
Alkaline Phosphatase: 60 U/L (ref 25–125)
BILIRUBIN, TOTAL: 0.5 mg/dL

## 2016-05-20 LAB — TSH: TSH: 4.3 u[IU]/mL (ref 0.41–5.90)

## 2016-05-21 ENCOUNTER — Encounter: Payer: Self-pay | Admitting: *Deleted

## 2016-05-29 ENCOUNTER — Encounter: Payer: Self-pay | Admitting: Nurse Practitioner

## 2016-05-29 ENCOUNTER — Non-Acute Institutional Stay: Payer: PPO | Admitting: Nurse Practitioner

## 2016-05-29 VITALS — BP 142/72 | HR 83 | Temp 98.7°F | Resp 20 | Ht 65.0 in | Wt 182.4 lb

## 2016-05-29 DIAGNOSIS — F419 Anxiety disorder, unspecified: Secondary | ICD-10-CM

## 2016-05-29 DIAGNOSIS — K219 Gastro-esophageal reflux disease without esophagitis: Secondary | ICD-10-CM

## 2016-05-29 DIAGNOSIS — R5382 Chronic fatigue, unspecified: Secondary | ICD-10-CM | POA: Diagnosis not present

## 2016-05-29 DIAGNOSIS — F5105 Insomnia due to other mental disorder: Secondary | ICD-10-CM

## 2016-05-29 DIAGNOSIS — N3281 Overactive bladder: Secondary | ICD-10-CM | POA: Diagnosis not present

## 2016-05-29 DIAGNOSIS — R319 Hematuria, unspecified: Secondary | ICD-10-CM | POA: Diagnosis not present

## 2016-05-29 DIAGNOSIS — N390 Urinary tract infection, site not specified: Secondary | ICD-10-CM | POA: Diagnosis not present

## 2016-05-29 DIAGNOSIS — I10 Essential (primary) hypertension: Secondary | ICD-10-CM

## 2016-05-29 NOTE — Progress Notes (Signed)
Patient ID: Jean [name], female   DOB: Mar 15, 1924, 82 y.o.   MRN: [id number]   Location:   clinic [place]   Place of Service:   clinic [place]  Provider: Marlana Latus NP  Code Status: DNR Goals of Care:  Advanced Directives 05/29/2016  Does patient have an advance directive? Yes  Type of Advance Directive [place]  Does patient want to make changes to advanced directive? No - Patient declined  Copy of advanced directive(s) in chart? Yes     Chief Complaint  Patient presents with  . Medical Management of Chronic Issues    Discuss lab results.    HPI: Patient is a 82 y.o. female seen today for an acute visit for quick onset fatigue, nausea, weakness, sweating, anxiety, recent changed pessary at Urology, afebrile, denied abd pain or dysuria, admitted urinary frequency. Hx of overactive bladder, taking Ditropan 5mg  daily. Blood pressure is controlled on Diovan-Hct 160/12.5mg . GERD stable on Omeprazole 40mg  daily.   04/21/16 urine culture K. Pneumoniae, completed 3/10 day course of Cipro, asymptomatic today.   Past Medical History  Diagnosis Date  . Hypertension   . GERD (gastroesophageal reflux disease)   . Hyperlipidemia   . Overactive bladder   . Obstructive sleep apnea   . Macular degeneration     ? which eye(s)  . Osteoporosis, senile   . Squamous cell skin cancer 2/16    left lower leg  . Hx of cataract surgery     both eyes  . Hyperglycemia   . Lumbago 04/05/2016    Past Surgical History  Procedure Laterality Date  . Abdominal hysterectomy  1960's  . Appendectomy  1960's  . Mole removal  2000    facial  . Tonsillectomy      Allergies  Allergen Reactions  . Adhesive [Tape]   . Ciprofloxacin Other (See Comments)  . Nitrofuran Derivatives       Medication List       This list is accurate as of: 05/29/16  2:00 PM.  Always use your most recent med list.               aspirin 325 MG tablet  Take 325 mg by mouth daily.     CALCIUM 600+D3  600-800 MG-UNIT Tabs  Generic drug:  Calcium Carb-Cholecalciferol  Take by mouth. Take one daily     CoQ10 100 MG Caps  Take by mouth. Take one tablet daily     Fish Oil 1200 MG Caps  Take by mouth. Take one daily     ICAPS Caps  Take by mouth. Take one I-Cap daily for eyes     LORazepam 0.5 MG tablet  Commonly known as:  ATIVAN  Take 0.5 mg by mouth. Take one tablet daily as needed for anxiety     multivitamin tablet  Take 1 tablet by mouth daily.     omeprazole 40 MG capsule  Commonly known as:  PRILOSEC  Take one capsule 1/2 hour before breakfast for stomach acid     oxybutynin 5 MG tablet  Commonly known as:  DITROPAN  Take one tablet at bedtime for bladder     SYSTANE 0.4-0.3 % Soln  Generic drug:  Polyethyl Glycol-Propyl Glycol  Apply to eye. One drop both eyes three times daily     SYSTANE 0.4-0.3 % Gel ophthalmic gel  Generic drug:  Polyethyl Glycol-Propyl Glycol  Place 1 application into both eyes. Apply to eye lids at bedtime  valsartan-hydrochlorothiazide 160-12.5 MG tablet  Commonly known as:  DIOVAN-HCT  Take 1 tablet by mouth daily. For blood pressure        Review of Systems:  Review of Systems  Constitutional: Negative for fever, chills, diaphoresis, activity change, appetite change, fatigue and unexpected weight change.       Fatigue, nausea, weakness, sweating, anxiety  HENT: Negative for congestion, ear discharge, ear pain, hearing loss, postnasal drip, rhinorrhea, sore throat, tinnitus, trouble swallowing and voice change.   Eyes: Negative for pain, redness, itching and visual disturbance.       Macular degeneration  Respiratory: Negative for cough, choking, shortness of breath and wheezing.        History of obstructive sleep apnea  Cardiovascular: Negative for chest pain, palpitations and leg swelling.  Gastrointestinal: Negative for nausea, abdominal pain, diarrhea, constipation and abdominal distention.       History GERD and reflux    Endocrine: Negative for cold intolerance, heat intolerance, polydipsia, polyphagia and polyuria.  Genitourinary: Negative for dysuria, urgency, frequency, hematuria, flank pain, vaginal discharge, difficulty urinating and pelvic pain.       Incontinent of urine. Has pessary.  Musculoskeletal: Positive for gait problem. Negative for myalgias, back pain, arthralgias, neck pain and neck stiffness.  Skin: Negative for color change, pallor and rash.  Allergic/Immunologic: Negative.   Neurological: Negative for dizziness, tremors, seizures, syncope, weakness, numbness and headaches.  Hematological: Negative for adenopathy. Does not bruise/bleed easily.  Psychiatric/Behavioral: Positive for sleep disturbance (insomnia). Negative for suicidal ideas, hallucinations, behavioral problems, confusion, dysphoric mood and agitation. The patient is nervous/anxious. The patient is not hyperactive.     Health Maintenance  Topic Date Due  . ZOSTAVAX  01/23/1986  . DEXA SCAN  01/23/1991  . INFLUENZA VACCINE  07/29/2016  . TETANUS/TDAP  12/14/2022  . PNA vac Low Risk Adult  Completed    Physical Exam: Filed Vitals:   05/29/16 1329  BP: 142/72  Pulse: 83  Temp: 98.7 F (37.1 C)  TempSrc: Oral  Resp: 20  Height: 5\' 5"  (1.651 m)  Weight: 182 lb 6.4 oz (82.736 kg)  SpO2: 98%   Body mass index is 30.35 kg/(m^2). Physical Exam  Constitutional: She is oriented to person, place, and time. She appears well-developed and well-nourished. No distress.  HENT:  Right Ear: External ear normal.  Left Ear: External ear normal.  Nose: Nose normal.  Mouth/Throat: Oropharynx is clear and moist. No oropharyngeal exudate.  Eyes: Conjunctivae and EOM are normal. Pupils are equal, round, and reactive to light. No scleral icterus.  Neck: No JVD present. No tracheal deviation present. No thyromegaly present.  Cardiovascular: Normal rate, regular rhythm, normal heart sounds and intact distal pulses.  Exam reveals no  gallop and no friction rub.   No murmur heard. Pulmonary/Chest: Effort normal. No respiratory distress. She has no wheezes. She has no rales. She exhibits no tenderness.  Abdominal: She exhibits no distension and no mass. There is no tenderness.  Musculoskeletal: Normal range of motion. She exhibits no edema or tenderness.  Tender in lower back. Unstable gait  Lymphadenopathy:    She has no cervical adenopathy.  Neurological: She is alert and oriented to person, place, and time. No cranial nerve deficit. Coordination normal.  Skin: No rash noted. She is not diaphoretic. No erythema. No pallor.  Psychiatric: She has a normal mood and affect. Her behavior is normal. Judgment and thought content normal.    Labs reviewed: Basic Metabolic Panel:  Recent Labs  03/18/16  05/20/16  NA 141 139  K 4.0 4.2  BUN 28* 21  CREATININE 0.8 0.8  TSH  --  4.30   Liver Function Tests:  Recent Labs  03/18/16 05/20/16  AST 26 24  ALT 19 19  ALKPHOS 65 60   No results for input(s): LIPASE, AMYLASE in the last 8760 hours. No results for input(s): AMMONIA in the last 8760 hours. CBC:  Recent Labs  03/18/16 05/20/16  WBC 6.7 5.7  HGB 12.1 12.2  HCT 35* 38  PLT 202 194   Lipid Panel:  Recent Labs  03/18/16  CHOL 187  HDL 53  LDLCALC 121  TRIG 66   Lab Results  Component Value Date   HGBA1C 5.7 03/18/2016    Procedures since last visit: No results found.  Assessment/Plan HTN (hypertension) Blood pressure is controlled, taking Valsartan HCT 160/12.5mg  daily, taking Mega 3 and ASA 325mg  for CV risk reduction. 05/20/16 Na 139, K 4.2, Bun 21, creat 0/78  GERD (gastroesophageal reflux disease) Stable, continue Omeprazole 40mg  daily, prn Mylanta  Overactive bladder 1-2/night, continue Ditropan 5mg  qhs  Insomnia secondary to anxiety  continue Lorazepam 0.5mg  prn daily   Fatigue Chronic, TSH 4.3 05/20/16     Labs/tests ordered:  @ORDERS @ CBC, CMP, TSH done 05/20/16  Next  appt:  06/19/2016

## 2016-05-29 NOTE — Assessment & Plan Note (Signed)
Blood pressure is controlled, taking Valsartan HCT 160/12.5mg  daily, taking Mega 3 and ASA 325mg  for CV risk reduction. 05/20/16 Na 139, K 4.2, Bun 21, creat 0/78

## 2016-05-29 NOTE — Assessment & Plan Note (Signed)
continue Lorazepam 0.5mg  prn daily

## 2016-05-29 NOTE — Assessment & Plan Note (Signed)
Chronic, TSH 4.3 05/20/16

## 2016-05-29 NOTE — Assessment & Plan Note (Signed)
Stable, continue Omeprazole 40mg daily, prn Mylanta 

## 2016-05-29 NOTE — Assessment & Plan Note (Signed)
1-2/night, continue Ditropan 5mg qhs 

## 2016-05-30 DIAGNOSIS — M6281 Muscle weakness (generalized): Secondary | ICD-10-CM | POA: Diagnosis not present

## 2016-05-30 DIAGNOSIS — G4733 Obstructive sleep apnea (adult) (pediatric): Secondary | ICD-10-CM | POA: Diagnosis not present

## 2016-05-30 DIAGNOSIS — R2681 Unsteadiness on feet: Secondary | ICD-10-CM | POA: Diagnosis not present

## 2016-05-30 DIAGNOSIS — M545 Low back pain: Secondary | ICD-10-CM | POA: Diagnosis not present

## 2016-06-17 DIAGNOSIS — N390 Urinary tract infection, site not specified: Secondary | ICD-10-CM | POA: Diagnosis not present

## 2016-06-19 ENCOUNTER — Encounter: Payer: Self-pay | Admitting: Internal Medicine

## 2016-07-03 ENCOUNTER — Non-Acute Institutional Stay: Payer: PPO | Admitting: Internal Medicine

## 2016-07-03 ENCOUNTER — Encounter: Payer: Self-pay | Admitting: Internal Medicine

## 2016-07-03 VITALS — BP 136/66 | HR 76 | Temp 98.0°F | Ht 65.0 in | Wt 180.0 lb

## 2016-07-03 DIAGNOSIS — F5105 Insomnia due to other mental disorder: Secondary | ICD-10-CM

## 2016-07-03 DIAGNOSIS — G8929 Other chronic pain: Secondary | ICD-10-CM

## 2016-07-03 DIAGNOSIS — I10 Essential (primary) hypertension: Secondary | ICD-10-CM | POA: Diagnosis not present

## 2016-07-03 DIAGNOSIS — H9193 Unspecified hearing loss, bilateral: Secondary | ICD-10-CM

## 2016-07-03 DIAGNOSIS — M545 Low back pain: Secondary | ICD-10-CM

## 2016-07-03 DIAGNOSIS — F419 Anxiety disorder, unspecified: Secondary | ICD-10-CM | POA: Diagnosis not present

## 2016-07-03 DIAGNOSIS — H9190 Unspecified hearing loss, unspecified ear: Secondary | ICD-10-CM | POA: Insufficient documentation

## 2016-07-03 DIAGNOSIS — R5382 Chronic fatigue, unspecified: Secondary | ICD-10-CM | POA: Diagnosis not present

## 2016-07-03 DIAGNOSIS — E785 Hyperlipidemia, unspecified: Secondary | ICD-10-CM

## 2016-07-03 DIAGNOSIS — N3281 Overactive bladder: Secondary | ICD-10-CM | POA: Diagnosis not present

## 2016-07-03 NOTE — Progress Notes (Signed)
Patient ID: Jean [name], female   DOB: May 17, 1924, 82 y.o.   MRN: [id number]    Facility      Place of Service: Clinic (12)     Allergies  Allergen Reactions  . Adhesive [Tape]   . Ampicillin   . Nitrofuran Derivatives   . Sulfur     Chief Complaint  Patient presents with  . Medical Management of Chronic Issues    4 month medication management blood pressure, hyperglycemia, anxiety.  Marland Kitchen MMSE    29/30 passed clock drawing    HPI:  Edmunds and her family suggested referral to audiologist due t episode of laryngitis that occurred recently and was accompanied by a loss of hearing. Ears feel stuffy. [place] nurse told her the TM was red. She says she really does not want to see another specialist at this time.  Essential hypertension - controlled  Hyperlipidemia - follow up next visit.  Chronic midline low back pain without sciatica - started acupuncture today.  Using walker.  Insomnia secondary to anxiety - improved  Bladder is doing Ok.  Fatigue is about the same.  Medications: Patient's Medications  New Prescriptions   No medications on file  Previous Medications   ASPIRIN 325 MG TABLET    Take 325 mg by mouth daily.   CALCIUM CARB-CHOLECALCIFEROL (CALCIUM 600+D3) 600-800 MG-UNIT TABS    Take by mouth. Take one daily   COENZYME Q10 (COQ10) 100 MG CAPS    Take by mouth. Take one tablet daily   LORAZEPAM (ATIVAN) 0.5 MG TABLET    Take 0.5 mg by mouth. Take one tablet daily as needed for anxiety   MULTIPLE VITAMIN (MULTIVITAMIN) TABLET    Take 1 tablet by mouth daily.   MULTIPLE VITAMINS-MINERALS (ICAPS) CAPS    Take by mouth. Take one I-Cap daily for eyes   OMEGA-3 FATTY ACIDS (FISH OIL) 1200 MG CAPS    Take by mouth. Take one daily   OMEPRAZOLE (PRILOSEC) 40 MG CAPSULE    Take one capsule 1/2 hour before breakfast for stomach acid   OXYBUTYNIN (DITROPAN) 5 MG TABLET    Take one tablet at bedtime for bladder   POLYETHYL GLYCOL-PROPYL GLYCOL (SYSTANE) 0.4-0.3 % GEL  OPHTHALMIC GEL    Place 1 application into both eyes. Apply to eye lids at bedtime   POLYETHYL GLYCOL-PROPYL GLYCOL (SYSTANE) 0.4-0.3 % SOLN    Apply to eye. One drop both eyes three times daily   VALSARTAN-HYDROCHLOROTHIAZIDE (DIOVAN-HCT) 160-12.5 MG TABLET    Take 1 tablet by mouth daily. For blood pressure  Modified Medications   No medications on file  Discontinued Medications   No medications on file     Review of Systems  Constitutional: Negative for fever, chills, diaphoresis, activity change, appetite change, fatigue and unexpected weight change.       Fatigue, nausea, weakness, sweating, anxiety  HENT: Negative for congestion, ear discharge, ear pain, hearing loss, postnasal drip, rhinorrhea, sore throat, tinnitus, trouble swallowing and voice change.   Eyes: Negative for pain, redness, itching and visual disturbance.       Macular degeneration  Respiratory: Negative for cough, choking, shortness of breath and wheezing.        History of obstructive sleep apnea  Cardiovascular: Negative for chest pain, palpitations and leg swelling.  Gastrointestinal: Negative for nausea, abdominal pain, diarrhea, constipation and abdominal distention.       History GERD and reflux  Endocrine: Negative for cold intolerance, heat intolerance, polydipsia, polyphagia and polyuria.  Genitourinary: Negative for dysuria, urgency, frequency, hematuria, flank pain, vaginal discharge, difficulty urinating and pelvic pain.       Incontinent of urine. Has pessary.  Musculoskeletal: Positive for gait problem. Negative for myalgias, back pain, arthralgias, neck pain and neck stiffness.  Skin: Negative for color change, pallor and rash.  Allergic/Immunologic: Negative.   Neurological: Negative for dizziness, tremors, seizures, syncope, weakness, numbness and headaches.  Hematological: Negative for adenopathy. Does not bruise/bleed easily.  Psychiatric/Behavioral: Positive for sleep disturbance (insomnia).  Negative for suicidal ideas, hallucinations, behavioral problems, confusion, dysphoric mood and agitation. The patient is nervous/anxious. The patient is not hyperactive.     Filed Vitals:   07/03/16 1516  BP: 136/66  Pulse: 76  Temp: 98 F (36.7 C)  TempSrc: Oral  Height: '5\' 5"'  (1.651 m)  Weight: 180 lb (81.647 kg)  SpO2: 97%   Wt Readings from Last 3 Encounters:  07/03/16 180 lb (81.647 kg)  05/29/16 182 lb 6.4 oz (82.736 kg)  04/17/16 176 lb 12.8 oz (80.196 kg)    Body mass index is 29.95 kg/(m^2).  Physical Exam  Constitutional: She is oriented to person, place, and time. She appears well-developed and well-nourished. No distress.  HENT:  Right Ear: External ear normal.  Left Ear: External ear normal.  Nose: Nose normal.  Mouth/Throat: Oropharynx is clear and moist. No oropharyngeal exudate.  Partial hearing loss. External auditory canal and TM bilaterally seem normal.  Eyes: Conjunctivae and EOM are normal. Pupils are equal, round, and reactive to light. No scleral icterus.  Neck: No JVD present. No tracheal deviation present. No thyromegaly present.  Cardiovascular: Normal rate, regular rhythm, normal heart sounds and intact distal pulses.  Exam reveals no gallop and no friction rub.   No murmur heard. Pulmonary/Chest: Effort normal. No respiratory distress. She has no wheezes. She has no rales. She exhibits no tenderness.  Abdominal: She exhibits no distension and no mass. There is no tenderness.  Musculoskeletal: Normal range of motion. She exhibits no edema or tenderness.  Tender in lower back. Unstable gait  Lymphadenopathy:    She has no cervical adenopathy.  Neurological: She is alert and oriented to person, place, and time. No cranial nerve deficit. Coordination normal.  Skin: No rash noted. She is not diaphoretic. No erythema. No pallor.  Psychiatric: She has a normal mood and affect. Her behavior is normal. Judgment and thought content normal.     Labs  reviewed: Lab Summary Latest Ref Rng 05/20/2016 03/18/2016  Hemoglobin 12.0 - 16.0 g/dL 12.2 12.1  Hematocrit 36 - 46 % 38 35(A)  White count - 5.7 6.7  Platelet count 150 - 399 K/L 194 202  Sodium 137 - 147 mmol/L 139 141  Potassium 3.4 - 5.3 mmol/L 4.2 4.0  Calcium - (None) (None)  Phosphorus - (None) (None)  Creatinine 0.5 - 1.1 mg/dL 0.8 0.8  AST 13 - 35 U/L 24 26  Alk Phos 25 - 125 U/L 60 65  Bilirubin - (None) (None)  Glucose - 87 98  Cholesterol 0 - 200 mg/dL (None) 187  HDL cholesterol 35 - 70 mg/dL (None) 53  Triglycerides 40 - 160 mg/dL (None) 66  LDL Direct - (None) (None)  LDL Calc - (None) 121  Total protein - (None) (None)  Albumin - (None) (None)   Lab Results  Component Value Date   TSH 4.30 05/20/2016   Lab Results  Component Value Date   BUN 21 05/20/2016   BUN 28* 03/18/2016   Lab  Results  Component Value Date   CREATININE 0.8 05/20/2016   CREATININE 0.8 03/18/2016   Lab Results  Component Value Date   HGBA1C 5.7 03/18/2016    Assessment/Plan  1. Essential hypertension Controlled lipid panel, future  2. Hyperlipidemia -Lipid panel, future  3. Chronic midline low back pain Continue current medication  4. Insomnia secondary to anxiety improved  5. Overactive bladder improved  6. Chronic fatigue improved  7. Hearing loss, bilateral Follow for now. She may decide to get audiology consultation later.

## 2016-07-15 ENCOUNTER — Encounter: Payer: Self-pay | Admitting: Nurse Practitioner

## 2016-07-16 DIAGNOSIS — M6281 Muscle weakness (generalized): Secondary | ICD-10-CM | POA: Diagnosis not present

## 2016-07-16 DIAGNOSIS — M545 Low back pain: Secondary | ICD-10-CM | POA: Diagnosis not present

## 2016-07-16 DIAGNOSIS — R2681 Unsteadiness on feet: Secondary | ICD-10-CM | POA: Diagnosis not present

## 2016-07-21 DIAGNOSIS — M25559 Pain in unspecified hip: Secondary | ICD-10-CM | POA: Diagnosis not present

## 2016-07-21 DIAGNOSIS — M533 Sacrococcygeal disorders, not elsewhere classified: Secondary | ICD-10-CM | POA: Diagnosis not present

## 2016-07-21 DIAGNOSIS — M545 Low back pain: Secondary | ICD-10-CM | POA: Diagnosis not present

## 2016-07-22 DIAGNOSIS — N390 Urinary tract infection, site not specified: Secondary | ICD-10-CM | POA: Diagnosis not present

## 2016-07-23 DIAGNOSIS — M4306 Spondylolysis, lumbar region: Secondary | ICD-10-CM | POA: Diagnosis not present

## 2016-07-23 DIAGNOSIS — M4180 Other forms of scoliosis, site unspecified: Secondary | ICD-10-CM | POA: Diagnosis not present

## 2016-07-23 DIAGNOSIS — M1288 Other specific arthropathies, not elsewhere classified, other specified site: Secondary | ICD-10-CM | POA: Diagnosis not present

## 2016-07-23 DIAGNOSIS — M5442 Lumbago with sciatica, left side: Secondary | ICD-10-CM | POA: Diagnosis not present

## 2016-07-24 ENCOUNTER — Encounter: Payer: Self-pay | Admitting: Nurse Practitioner

## 2016-07-24 ENCOUNTER — Non-Acute Institutional Stay: Payer: PPO | Admitting: Nurse Practitioner

## 2016-07-24 DIAGNOSIS — K2100 Gastro-esophageal reflux disease with esophagitis, without bleeding: Secondary | ICD-10-CM

## 2016-07-24 DIAGNOSIS — M5440 Lumbago with sciatica, unspecified side: Secondary | ICD-10-CM

## 2016-07-24 DIAGNOSIS — I10 Essential (primary) hypertension: Secondary | ICD-10-CM

## 2016-07-24 DIAGNOSIS — F5105 Insomnia due to other mental disorder: Secondary | ICD-10-CM | POA: Diagnosis not present

## 2016-07-24 DIAGNOSIS — F419 Anxiety disorder, unspecified: Secondary | ICD-10-CM

## 2016-07-24 DIAGNOSIS — N3281 Overactive bladder: Secondary | ICD-10-CM | POA: Diagnosis not present

## 2016-07-24 NOTE — Assessment & Plan Note (Signed)
continue Lorazepam 0.5mg  prn daily

## 2016-07-24 NOTE — Assessment & Plan Note (Signed)
Blood pressure is controlled, taking Valsartan HCT 160/12.5mg daily, taking Mega 3 and ASA 325mg for CV risk reduction.   

## 2016-07-24 NOTE — Progress Notes (Signed)
Patient ID: Jean [name], female   DOB: 02-15-1924, 82 y.o.   MRN: [id number]   Location:   clinic [facility]   Place of Service:  Clinic (12[facility]  Provider: Marlana Latus NP  Code Status: DNR Goals of Care:  Advanced Directives 07/24/2016  Does patient have an advance directive? Yes  Type of Advance Directive [place]  Does patient want to make changes to advanced directive? No - Patient declined  Copy of advanced directive(s) in chart? Yes     Chief Complaint  Patient presents with  . Acute Visit    Left leg and thigh pain since accuputure therapy    HPI: Patient is a 82 y.o. female seen today for an acute visit for back pain, denied injury, Ortho, prn Tramadol available to her, pain is not well controlled, pain in the left sacrum region travels to the posterior left thigh and knee,  X-ray 07/21/16 lumbar spine, hip, SIJs unremarkable. Hx of overactive bladder, taking Ditropan 5mg  daily. Blood pressure is controlled on Diovan-Hct 160/12.5mg . GERD stable on Omeprazole 40mg  daily.   04/21/16 urine culture K. Pneumoniae, completed 3/10 day course of Cipro, asymptomatic today.   Pending UA C/S Past Medical History:  Diagnosis Date  . GERD (gastroesophageal reflux disease)   . Hx of cataract surgery    both eyes  . Hyperglycemia   . Hyperlipidemia   . Hypertension   . Lumbago 04/05/2016  . Macular degeneration    ? which eye(s)  . Obstructive sleep apnea   . Osteoporosis, senile   . Overactive bladder   . Squamous cell skin cancer 2/16   left lower leg    Past Surgical History:  Procedure Laterality Date  . ABDOMINAL HYSTERECTOMY  1960's  . APPENDECTOMY  1960's  . MOLE REMOVAL  2000   facial  . TONSILLECTOMY      Allergies  Allergen Reactions  . Adhesive [Tape]   . Ampicillin   . Nitrofuran Derivatives   . Sulfur       Medication List       Accurate as of 07/24/16  3:24 PM. Always use your most recent med list.          aspirin 325 MG  tablet Take 325 mg by mouth daily.   CALCIUM 600+D3 600-800 MG-UNIT Tabs Generic drug:  Calcium Carb-Cholecalciferol Take by mouth. Take one daily   CoQ10 100 MG Caps Take by mouth. Take one tablet daily   Fish Oil 1200 MG Caps Take by mouth. Take one daily   ICAPS Caps Take by mouth. Take one I-Cap daily for eyes   LORazepam 0.5 MG tablet Commonly known as:  ATIVAN Take 0.5 mg by mouth. Take one tablet daily as needed for anxiety   multivitamin tablet Take 1 tablet by mouth daily.   omeprazole 40 MG capsule Commonly known as:  PRILOSEC Take one capsule 1/2 hour before breakfast for stomach acid   oxybutynin 5 MG tablet Commonly known as:  DITROPAN Take one tablet at bedtime for bladder   SYSTANE 0.4-0.3 % Soln Generic drug:  Polyethyl Glycol-Propyl Glycol Apply to eye. One drop both eyes three times daily   SYSTANE 0.4-0.3 % Gel ophthalmic gel Generic drug:  Polyethyl Glycol-Propyl Glycol Place 1 application into both eyes. Apply to eye lids at bedtime   valsartan-hydrochlorothiazide 160-12.5 MG tablet Commonly known as:  DIOVAN-HCT Take 1 tablet by mouth daily. For blood pressure       Review of Systems:  Review  of Systems  Constitutional: Negative for activity change, appetite change, chills, diaphoresis, fatigue, fever and unexpected weight change.       Fatigue, nausea, weakness, sweating, anxiety  HENT: Negative for congestion, ear discharge, ear pain, hearing loss, postnasal drip, rhinorrhea, sore throat, tinnitus, trouble swallowing and voice change.   Eyes: Negative for pain, redness, itching and visual disturbance.       Macular degeneration  Respiratory: Negative for cough, choking, shortness of breath and wheezing.        History of obstructive sleep apnea  Cardiovascular: Negative for chest pain, palpitations and leg swelling.  Gastrointestinal: Negative for abdominal distention, abdominal pain, constipation, diarrhea and nausea.       History  GERD and reflux  Endocrine: Negative for cold intolerance, heat intolerance, polydipsia, polyphagia and polyuria.  Genitourinary: Negative for difficulty urinating, dysuria, flank pain, frequency, hematuria, pelvic pain, urgency and vaginal discharge.       Incontinent of urine. Has pessary.  Musculoskeletal: Positive for arthralgias and gait problem. Negative for back pain, myalgias, neck pain and neck stiffness.  Skin: Negative for color change, pallor and rash.  Allergic/Immunologic: Negative.   Neurological: Negative for dizziness, tremors, seizures, syncope, weakness, numbness and headaches.  Hematological: Negative for adenopathy. Does not bruise/bleed easily.  Psychiatric/Behavioral: Positive for sleep disturbance (insomnia). Negative for agitation, behavioral problems, confusion, dysphoric mood, hallucinations and suicidal ideas. The patient is nervous/anxious. The patient is not hyperactive.     Health Maintenance  Topic Date Due  . ZOSTAVAX  01/23/1986  . DEXA SCAN  01/23/1991  . INFLUENZA VACCINE  07/29/2016  . TETANUS/TDAP  12/14/2022  . PNA vac Low Risk Adult  Completed    Physical Exam: Vitals:   07/24/16 1445  BP: 136/74  Pulse: 73  Resp: 20  Temp: 98.4 F (36.9 C)  TempSrc: Oral  SpO2: 95%  Weight: 179 lb 9.6 oz (81.5 kg)  Height: 5\' 5"  (1.651 m)   Body mass index is 29.89 kg/m. Physical Exam  Constitutional: She is oriented to person, place, and time. She appears well-developed and well-nourished. No distress.  HENT:  Right Ear: External ear normal.  Left Ear: External ear normal.  Nose: Nose normal.  Mouth/Throat: Oropharynx is clear and moist. No oropharyngeal exudate.  Eyes: Conjunctivae and EOM are normal. Pupils are equal, round, and reactive to light. No scleral icterus.  Neck: No JVD present. No tracheal deviation present. No thyromegaly present.  Cardiovascular: Normal rate, regular rhythm, normal heart sounds and intact distal pulses.  Exam  reveals no gallop and no friction rub.   No murmur heard. Pulmonary/Chest: Effort normal. No respiratory distress. She has no wheezes. She has no rales. She exhibits no tenderness.  Abdominal: She exhibits no distension and no mass. There is no tenderness.  Musculoskeletal: Normal range of motion. She exhibits no edema or tenderness.  Tender in lower back. Unstable gait  Lymphadenopathy:    She has no cervical adenopathy.  Neurological: She is alert and oriented to person, place, and time. No cranial nerve deficit. Coordination normal.  Skin: No rash noted. She is not diaphoretic. No erythema. No pallor.  Psychiatric: She has a normal mood and affect. Her behavior is normal. Judgment and thought content normal.    Labs reviewed: Basic Metabolic Panel:  Recent Labs  03/18/16 05/20/16  NA 141 139  K 4.0 4.2  BUN 28* 21  CREATININE 0.8 0.8  TSH  --  4.30   Liver Function Tests:  Recent Labs  03/18/16  05/20/16  AST 26 24  ALT 19 19  ALKPHOS 65 60   No results for input(s): LIPASE, AMYLASE in the last 8760 hours. No results for input(s): AMMONIA in the last 8760 hours. CBC:  Recent Labs  03/18/16 05/20/16  WBC 6.7 5.7  HGB 12.1 12.2  HCT 35* 38  PLT 202 194   Lipid Panel:  Recent Labs  03/18/16  CHOL 187  HDL 53  LDLCALC 121  TRIG 66   Lab Results  Component Value Date   HGBA1C 5.7 03/18/2016    Procedures since last visit: No results found.  Assessment/Plan HTN (hypertension) Blood pressure is controlled, taking Valsartan HCT 160/12.5mg  daily, taking Mega 3 and ASA 325mg  for CV risk reduction.   GERD (gastroesophageal reflux disease) Stable, continue Omeprazole 40mg  daily, prn Mylanta   Overactive bladder 1-2/night, continue Ditropan 5mg  qhs Pending UA C/S  Lumbago 07/21/16 UA C/S pending. X-ray L hip, sacrum, coccyx, lumbar spine: no acute osseous abnormality evident.  07/22/16 Ortho referral.  Tramadol prn 07/24/16 not adequate pain control,  left sacral region, travels to the posterior left thigh and knee. Norco tid, PT to eval and tx. Update CBC, CMP, TSH  Insomnia secondary to anxiety continue Lorazepam 0.5mg  prn daily      Labs/tests ordered: X-ray lumbar spine, hip, SIJ done. Update CBC, CMP, TSH  Next appt:  prn

## 2016-07-24 NOTE — Assessment & Plan Note (Signed)
1-2/night, continue Ditropan 5mg  qhs Pending UA C/S

## 2016-07-24 NOTE — Assessment & Plan Note (Signed)
Stable, continue Omeprazole 40mg daily, prn Mylanta 

## 2016-07-24 NOTE — Assessment & Plan Note (Addendum)
07/21/16 UA C/S pending. X-ray L hip, sacrum, coccyx, lumbar spine: no acute osseous abnormality evident.  07/22/16 Ortho referral.  Tramadol prn 07/24/16 not adequate pain control, left sacral region, travels to the posterior left thigh and knee. Norco tid, PT to eval and tx. Update CBC, CMP, TSH

## 2016-07-29 DIAGNOSIS — I10 Essential (primary) hypertension: Secondary | ICD-10-CM | POA: Diagnosis not present

## 2016-07-29 DIAGNOSIS — E039 Hypothyroidism, unspecified: Secondary | ICD-10-CM | POA: Diagnosis not present

## 2016-07-29 LAB — HEPATIC FUNCTION PANEL
ALK PHOS: 59 U/L (ref 25–125)
ALT: 12 U/L (ref 7–35)
AST: 19 U/L (ref 13–35)
BILIRUBIN, TOTAL: 0.4 mg/dL

## 2016-07-29 LAB — CBC AND DIFFERENTIAL
HCT: 37 % (ref 36–46)
Hemoglobin: 12.2 g/dL (ref 12.0–16.0)
PLATELETS: 172 10*3/uL (ref 150–399)
WBC: 6.6 10*3/mL

## 2016-07-29 LAB — BASIC METABOLIC PANEL
BUN: 22 mg/dL — AB (ref 4–21)
CREATININE: 1 mg/dL (ref <=1.1)
GLUCOSE: 97 mg/dL
Potassium: 4.8 mmol/L (ref 3.4–5.3)
Sodium: 142 mmol/L (ref 137–147)

## 2016-07-29 LAB — TSH: TSH: 3.08 u[IU]/mL (ref <=5.90)

## 2016-07-30 ENCOUNTER — Non-Acute Institutional Stay: Payer: PPO | Admitting: Nurse Practitioner

## 2016-07-30 ENCOUNTER — Encounter: Payer: Self-pay | Admitting: Nurse Practitioner

## 2016-07-30 DIAGNOSIS — K5900 Constipation, unspecified: Secondary | ICD-10-CM | POA: Diagnosis not present

## 2016-07-30 DIAGNOSIS — I10 Essential (primary) hypertension: Secondary | ICD-10-CM | POA: Diagnosis not present

## 2016-07-30 DIAGNOSIS — M5440 Lumbago with sciatica, unspecified side: Secondary | ICD-10-CM | POA: Diagnosis not present

## 2016-07-30 DIAGNOSIS — R269 Unspecified abnormalities of gait and mobility: Secondary | ICD-10-CM

## 2016-07-30 DIAGNOSIS — N3281 Overactive bladder: Secondary | ICD-10-CM

## 2016-07-30 DIAGNOSIS — K219 Gastro-esophageal reflux disease without esophagitis: Secondary | ICD-10-CM | POA: Diagnosis not present

## 2016-07-30 NOTE — Assessment & Plan Note (Signed)
Pain in the left SIJ, hip, leg, walking with walker, increase Norco qid, pending spinal inj, s/p ortho.

## 2016-07-30 NOTE — Assessment & Plan Note (Signed)
Stable, continue Omeprazole 40mg daily, prn Mylanta 

## 2016-07-30 NOTE — Assessment & Plan Note (Signed)
Senna S II qhs  

## 2016-07-30 NOTE — Assessment & Plan Note (Signed)
07/21/16 X-ray L hip, sacrum, coccyx, lumbar spine: no acute osseous abnormality evident.  Tramadol prn 07/24/16 pain left sacral region, travels to the posterior left thigh and knee. Norco tid 07/30/16 better pain control during day, worse at night, will increase Norco to with meals and hs. Pending spinal inj.

## 2016-07-30 NOTE — Assessment & Plan Note (Signed)
1-2/night, continue Ditropan 5mg qhs 

## 2016-07-30 NOTE — Progress Notes (Signed)
Location:   [place] Room Number: [place] of Service:  ALF (13) Provider:  Sylvio Weatherall  NP   Patient Care Team: Estill Dooms, MD as PCP - General (Internal Medicine) Nayzeth Altman Otho Darner, NP as Nurse Practitioner (Internal Medicine)  Extended Emergency Contact Information Primary Emergency Contact: [facility] Address: [address]          [place], [place] [address] Johnnette Litter of [place] Phone: [phone] Mobile Phone: [phone] Relation: Son  Code Status:   Goals of care: Advanced Directive information Advanced Directives 07/30/2016  Does patient have an advance directive? Yes  Type of Advance Directive [place]  Does patient want to make changes to advanced directive? No - Patient declined  Copy of advanced directive(s) in chart? Yes     Chief Complaint  Patient presents with  . Acute Visit    Increased pain to spine area.    HPI:  Pt is a 82 y.o. female seen today for an acute visit for persisted back pain, s/p Ortho consult, pending spinal inj. Pain is controlled during day with Norco tid, but c/o pain at night worse. Hx of overactive bladder, taking Ditropan 5mg  daily. Blood pressure is controlled on Diovan-Hct 160/12.5mg . GERD stable on Omeprazole 40mg  daily.    Past Medical History:  Diagnosis Date  . GERD (gastroesophageal reflux disease)   . Hx of cataract surgery    both eyes  . Hyperglycemia   . Hyperlipidemia   . Hypertension   . Lumbago 04/05/2016  . Macular degeneration    ? which eye(s)  . Obstructive sleep apnea   . Osteoporosis, senile   . Overactive bladder   . Squamous cell skin cancer 2/16   left lower leg   Past Surgical History:  Procedure Laterality Date  . ABDOMINAL HYSTERECTOMY  1960's  . APPENDECTOMY  1960's  . MOLE REMOVAL  2000   facial  . TONSILLECTOMY      Allergies  Allergen Reactions  . Adhesive [Tape]   . Ampicillin   . Nitrofuran Derivatives   . Sulfur         Medication List       Accurate as of 07/30/16  3:17 PM. Always use your most recent med list.          aspirin 325 MG tablet Take 325 mg by mouth daily.   CALCIUM 600+D3 600-800 MG-UNIT Tabs Generic drug:  Calcium Carb-Cholecalciferol Take by mouth. Take one daily   CoQ10 100 MG Caps Take by mouth. Take one tablet daily   Fish Oil 1200 MG Caps Take by mouth. Take one daily   HYDROcodone-acetaminophen 5-325 MG tablet Commonly known as:  NORCO/VICODIN Take 1 tablet by mouth 3 (three) times daily.   ICAPS Caps Take by mouth. Take one I-Cap daily for eyes   LORazepam 0.5 MG tablet Commonly known as:  ATIVAN Take 0.5 mg by mouth. Take one tablet daily as needed for anxiety   omeprazole 40 MG capsule Commonly known as:  PRILOSEC Take one capsule 1/2 hour before breakfast for stomach acid   oxybutynin 5 MG tablet Commonly known as:  DITROPAN Take one tablet at bedtime for bladder   SYSTANE 0.4-0.3 % Soln Generic drug:  Polyethyl Glycol-Propyl Glycol Apply to eye. One drop both eyes three times daily   SYSTANE 0.4-0.3 % Gel ophthalmic gel Generic drug:  Polyethyl Glycol-Propyl Glycol Place 1 application into both eyes. Apply to eye lids at bedtime  traMADol 50 MG tablet Commonly known as:  ULTRAM Take 1-2 tablets by mouth 4 (four) times daily as needed.   valsartan-hydrochlorothiazide 160-12.5 MG tablet Commonly known as:  DIOVAN-HCT Take 1 tablet by mouth daily. For blood pressure       Review of Systems  Constitutional: Negative for activity change, appetite change, chills, diaphoresis, fatigue, fever and unexpected weight change.       Fatigue, nausea, weakness, sweating, anxiety  HENT: Negative for congestion, ear discharge, ear pain, hearing loss, postnasal drip, rhinorrhea, sore throat, tinnitus, trouble swallowing and voice change.   Eyes: Negative for pain, redness, itching and visual disturbance.       Macular degeneration  Respiratory: Negative for  cough, choking, shortness of breath and wheezing.        History of obstructive sleep apnea  Cardiovascular: Negative for chest pain, palpitations and leg swelling.  Gastrointestinal: Positive for constipation. Negative for abdominal distention, abdominal pain, diarrhea and nausea.       History GERD and reflux  Endocrine: Negative for cold intolerance, heat intolerance, polydipsia, polyphagia and polyuria.  Genitourinary: Negative for difficulty urinating, dysuria, flank pain, frequency, hematuria, pelvic pain, urgency and vaginal discharge.       Incontinent of urine. Has pessary.  Musculoskeletal: Positive for arthralgias and gait problem. Negative for back pain, myalgias, neck pain and neck stiffness.  Skin: Negative for color change, pallor and rash.  Allergic/Immunologic: Negative.   Neurological: Negative for dizziness, tremors, seizures, syncope, weakness, numbness and headaches.  Hematological: Negative for adenopathy. Does not bruise/bleed easily.  Psychiatric/Behavioral: Positive for sleep disturbance (insomnia). Negative for agitation, behavioral problems, confusion, dysphoric mood, hallucinations and suicidal ideas. The patient is nervous/anxious. The patient is not hyperactive.     Immunization History  Administered Date(s) Administered  . Influenza-Unspecified 09/27/2015  . Pneumococcal Conjugate-13 01/23/2015  . Pneumococcal Polysaccharide-23 10/20/2007  . Td 12/14/2012   Pertinent  Health Maintenance Due  Topic Date Due  . DEXA SCAN  01/23/1991  . INFLUENZA VACCINE  07/29/2016  . PNA vac Low Risk Adult  Completed   Fall Risk  05/29/2016 04/17/2016 03/27/2016 11/29/2015  Falls in the past year? No No No No   Functional Status Survey:    Vitals:   07/30/16 0954  BP: (!) 150/70  Pulse: 62  Resp: 17  Temp: 97.8 F (36.6 C)  Weight: 179 lb 10.8 oz (81.5 kg)  Height: 5\' 5"  (1.651 m)   Body mass index is 29.9 kg/m. Physical Exam  Constitutional: She is oriented  to person, place, and time. She appears well-developed and well-nourished. No distress.  HENT:  Right Ear: External ear normal.  Left Ear: External ear normal.  Nose: Nose normal.  Mouth/Throat: Oropharynx is clear and moist. No oropharyngeal exudate.  Eyes: Conjunctivae and EOM are normal. Pupils are equal, round, and reactive to light. No scleral icterus.  Neck: No JVD present. No tracheal deviation present. No thyromegaly present.  Cardiovascular: Normal rate, regular rhythm, normal heart sounds and intact distal pulses.  Exam reveals no gallop and no friction rub.   No murmur heard. Pulmonary/Chest: Effort normal. No respiratory distress. She has no wheezes. She has no rales. She exhibits no tenderness.  Abdominal: She exhibits no distension and no mass. There is no tenderness.  Musculoskeletal: Normal range of motion. She exhibits no edema or tenderness.  Tender in lower back. Unstable gait  Lymphadenopathy:    She has no cervical adenopathy.  Neurological: She is alert and oriented to person, place,  and time. No cranial nerve deficit. Coordination normal.  Skin: No rash noted. She is not diaphoretic. No erythema. No pallor.  Psychiatric: She has a normal mood and affect. Her behavior is normal. Judgment and thought content normal.    Labs reviewed:  Recent Labs  03/18/16 05/20/16 07/29/16  NA 141 139 142  K 4.0 4.2 4.8  BUN 28* 21 22*  CREATININE 0.8 0.8 1.0    Recent Labs  03/18/16 05/20/16 07/29/16  AST 26 24 19   ALT 19 19 12   ALKPHOS 65 60 59    Recent Labs  03/18/16 05/20/16 07/29/16  WBC 6.7 5.7 6.6  HGB 12.1 12.2 12.2  HCT 35* 38 37  PLT 202 194 172   Lab Results  Component Value Date   TSH 3.08 07/29/2016   Lab Results  Component Value Date   HGBA1C 5.7 03/18/2016   Lab Results  Component Value Date   CHOL 187 03/18/2016   HDL 53 03/18/2016   LDLCALC 121 03/18/2016   TRIG 66 03/18/2016    Significant Diagnostic Results in last 30 days:  No  results found.  Assessment/Plan There are no diagnoses linked to this encounter.GERD (gastroesophageal reflux disease) Stable, continue Omeprazole 40mg  daily, prn Mylanta    Overactive bladder 1-2/night, continue Ditropan 5mg  qhs   Lumbago 07/21/16 X-ray L hip, sacrum, coccyx, lumbar spine: no acute osseous abnormality evident.  Tramadol prn 07/24/16 pain left sacral region, travels to the posterior left thigh and knee. Norco tid 07/30/16 better pain control during day, worse at night, will increase Norco to with meals and hs. Pending spinal inj.   Abnormality of gait Pain in the left SIJ, hip, leg, walking with walker, increase Norco qid, pending spinal inj, s/p ortho.   Constipation Senna S II qhs     Family/ staff Communication: pending spinal inj.   Labs/tests ordered:  07/29/16 CBC, CMP, TSH done

## 2016-08-08 DIAGNOSIS — M545 Low back pain: Secondary | ICD-10-CM | POA: Diagnosis not present

## 2016-08-08 DIAGNOSIS — M5137 Other intervertebral disc degeneration, lumbosacral region: Secondary | ICD-10-CM | POA: Diagnosis not present

## 2016-08-08 DIAGNOSIS — M5136 Other intervertebral disc degeneration, lumbar region: Secondary | ICD-10-CM | POA: Diagnosis not present

## 2016-08-19 DIAGNOSIS — M545 Low back pain: Secondary | ICD-10-CM | POA: Diagnosis not present

## 2016-08-19 DIAGNOSIS — M6281 Muscle weakness (generalized): Secondary | ICD-10-CM | POA: Diagnosis not present

## 2016-08-19 DIAGNOSIS — R2681 Unsteadiness on feet: Secondary | ICD-10-CM | POA: Diagnosis not present

## 2016-08-28 DIAGNOSIS — M1288 Other specific arthropathies, not elsewhere classified, other specified site: Secondary | ICD-10-CM | POA: Diagnosis not present

## 2016-08-28 DIAGNOSIS — M5442 Lumbago with sciatica, left side: Secondary | ICD-10-CM | POA: Diagnosis not present

## 2016-08-28 DIAGNOSIS — M4306 Spondylolysis, lumbar region: Secondary | ICD-10-CM | POA: Diagnosis not present

## 2016-08-29 DIAGNOSIS — M6281 Muscle weakness (generalized): Secondary | ICD-10-CM | POA: Diagnosis not present

## 2016-08-29 DIAGNOSIS — M545 Low back pain: Secondary | ICD-10-CM | POA: Diagnosis not present

## 2016-08-29 DIAGNOSIS — R2681 Unsteadiness on feet: Secondary | ICD-10-CM | POA: Diagnosis not present

## 2016-09-04 ENCOUNTER — Encounter: Payer: Self-pay | Admitting: Nurse Practitioner

## 2016-09-04 ENCOUNTER — Non-Acute Institutional Stay: Payer: PPO | Admitting: Nurse Practitioner

## 2016-09-04 DIAGNOSIS — F5105 Insomnia due to other mental disorder: Secondary | ICD-10-CM | POA: Diagnosis not present

## 2016-09-04 DIAGNOSIS — K5900 Constipation, unspecified: Secondary | ICD-10-CM | POA: Diagnosis not present

## 2016-09-04 DIAGNOSIS — F419 Anxiety disorder, unspecified: Secondary | ICD-10-CM | POA: Diagnosis not present

## 2016-09-04 DIAGNOSIS — N3281 Overactive bladder: Secondary | ICD-10-CM

## 2016-09-04 DIAGNOSIS — K219 Gastro-esophageal reflux disease without esophagitis: Secondary | ICD-10-CM

## 2016-09-04 DIAGNOSIS — M5440 Lumbago with sciatica, unspecified side: Secondary | ICD-10-CM | POA: Diagnosis not present

## 2016-09-04 DIAGNOSIS — I10 Essential (primary) hypertension: Secondary | ICD-10-CM | POA: Diagnosis not present

## 2016-09-04 MED ORDER — DULOXETINE HCL 30 MG PO CPEP: 30.0000 mg | ORAL_CAPSULE | Freq: Every day | ORAL | 1 refills | Status: DC

## 2016-09-04 NOTE — Assessment & Plan Note (Signed)
07/21/16 X-ray L hip, sacrum, coccyx, lumbar spine: no acute osseous abnormality evident.  Tramadol prn 07/24/16 pain left sacral region, travels to the posterior left thigh and knee. Norco tid 07/30/16 better pain control during day, worse at night, will increase Norco to with meals and hs. Pending spinal inj.  09/04/16 better, will taper off Norco, continue Tramadol for pain

## 2016-09-04 NOTE — Assessment & Plan Note (Addendum)
continue Lorazepam 0.5mg  prn daily, adding Cymbalta 30mg  po daily. Observe the patient. BMP 2 weeks.

## 2016-09-04 NOTE — Assessment & Plan Note (Signed)
Stable, continue Senna S II qhs.    

## 2016-09-04 NOTE — Assessment & Plan Note (Signed)
1-2/night, continue Ditropan 5mg qhs 

## 2016-09-04 NOTE — Progress Notes (Signed)
Location:   [facility] (12) Provider:  Luisfelipe Engelstad  NP   Patient Care Team: Estill Dooms, MD as PCP - General (Internal Medicine) Aryka Coonradt Otho Darner, NP as Nurse Practitioner (Internal Medicine)  Extended Emergency Contact Information Primary Emergency Contact: [facility] Address: [address]          [place], [place] [address] Johnnette Litter of [place] Phone: [phone] Mobile Phone: [phone] Relation: Son  Code Status:   Goals of care: Advanced Directive information Advanced Directives 09/04/2016  Does patient have an advance directive? Yes  Type of Advance Directive [place]  Does patient want to make changes to advanced directive? No - Patient declined  Copy of advanced directive(s) in chart? Yes     Chief Complaint  Patient presents with  . Acute Visit    Anxiety    HPI:  Pt is a 82 y.o. female seen today for an acute visit for anxiety,  f/u  persisted back pain, s/p Ortho consult, s/p spinal inj. Pain is controlled with Norco tid, will taper off Norco, continue with Tramadol.   Hx of overactive bladder, taking Ditropan 5mg  daily. Blood pressure is controlled on Diovan-Hct 160/12.5mg . GERD stable on Omeprazole 40mg  daily.   Past Medical History:  Diagnosis Date  . GERD (gastroesophageal reflux disease)   . Hx of cataract surgery    both eyes  . Hyperglycemia   . Hyperlipidemia   . Hypertension   . Lumbago 04/05/2016  . Macular degeneration    ? which eye(s)  . Obstructive sleep apnea   . Osteoporosis, senile   . Overactive bladder   . Squamous cell skin cancer 2/16   left lower leg   Past Surgical History:  Procedure Laterality Date  . ABDOMINAL HYSTERECTOMY  1960's  . APPENDECTOMY  1960's  . MOLE REMOVAL  2000   facial  . TONSILLECTOMY      Allergies  Allergen Reactions  . Adhesive [Tape]   . Ampicillin   . Nitrofuran Derivatives   . Sulfur       Medication List       Accurate  as of 09/04/16  1:59 PM. Always use your most recent med list.          aspirin 325 MG tablet Take 325 mg by mouth daily.   CALCIUM 600+D3 600-800 MG-UNIT Tabs Generic drug:  Calcium Carb-Cholecalciferol Take by mouth. Take one daily   CoQ10 100 MG Caps Take by mouth. Take one tablet daily   Fish Oil 1200 MG Caps Take by mouth. Take one daily   HYDROcodone-acetaminophen 5-325 MG tablet Commonly known as:  NORCO/VICODIN Take 1 tablet by mouth 3 (three) times daily.   ICAPS Caps Take by mouth. Take one I-Cap daily for eyes   LORazepam 0.5 MG tablet Commonly known as:  ATIVAN Take 0.5 mg by mouth. Take one tablet daily as needed for anxiety   omeprazole 40 MG capsule Commonly known as:  PRILOSEC Take one capsule 1/2 hour before breakfast for stomach acid   oxybutynin 5 MG tablet Commonly known as:  DITROPAN Take one tablet at bedtime for bladder   SYSTANE 0.4-0.3 % Soln Generic drug:  Polyethyl Glycol-Propyl Glycol Apply to eye. One drop both eyes three times daily   SYSTANE 0.4-0.3 % Gel ophthalmic gel Generic drug:  Polyethyl Glycol-Propyl Glycol Place 1 application into both eyes. Apply to eye lids at bedtime   traMADol 50 MG tablet  Commonly known as:  ULTRAM Take 1-2 tablets by mouth 4 (four) times daily as needed.   valsartan-hydrochlorothiazide 160-12.5 MG tablet Commonly known as:  DIOVAN-HCT Take 1 tablet by mouth daily. For blood pressure       Review of Systems  Constitutional: Negative for activity change, appetite change, chills, diaphoresis, fatigue, fever and unexpected weight change.       Fatigue, nausea, weakness, sweating, anxiety  HENT: Negative for congestion, ear discharge, ear pain, hearing loss, postnasal drip, rhinorrhea, sore throat, tinnitus, trouble swallowing and voice change.   Eyes: Negative for pain, redness, itching and visual disturbance.       Macular degeneration  Respiratory: Negative for cough, choking, shortness of breath  and wheezing.        History of obstructive sleep apnea  Cardiovascular: Negative for chest pain, palpitations and leg swelling.  Gastrointestinal: Positive for constipation. Negative for abdominal distention, abdominal pain, diarrhea and nausea.       History GERD and reflux  Endocrine: Negative for cold intolerance, heat intolerance, polydipsia, polyphagia and polyuria.  Genitourinary: Negative for difficulty urinating, dysuria, flank pain, frequency, hematuria, pelvic pain, urgency and vaginal discharge.       Incontinent of urine. Has pessary.  Musculoskeletal: Positive for arthralgias and gait problem. Negative for back pain, myalgias, neck pain and neck stiffness.  Skin: Negative for color change, pallor and rash.  Allergic/Immunologic: Negative.   Neurological: Negative for dizziness, tremors, seizures, syncope, weakness, numbness and headaches.  Hematological: Negative for adenopathy. Does not bruise/bleed easily.  Psychiatric/Behavioral: Positive for sleep disturbance (insomnia). Negative for agitation, behavioral problems, confusion, dysphoric mood, hallucinations and suicidal ideas. The patient is nervous/anxious. The patient is not hyperactive.     Immunization History  Administered Date(s) Administered  . Influenza-Unspecified 09/27/2015  . Pneumococcal Conjugate-13 01/23/2015  . Pneumococcal Polysaccharide-23 10/20/2007  . Td 12/14/2012   Pertinent  Health Maintenance Due  Topic Date Due  . DEXA SCAN  01/23/1991  . INFLUENZA VACCINE  07/29/2016  . PNA vac Low Risk Adult  Completed   Fall Risk  05/29/2016 04/17/2016 03/27/2016 11/29/2015  Falls in the past year? No No No No   Functional Status Survey:    Vitals:   09/04/16 1334  BP: 120/78  Pulse: 98  Resp: 20  Temp: 98.6 F (37 C)  TempSrc: Oral  Weight: 173 lb 3.2 oz (78.6 kg)  Height: 5\' 5"  (1.651 m)   Body mass index is 28.82 kg/m. Physical Exam  Constitutional: She is oriented to person, place, and time.  She appears well-developed and well-nourished. No distress.  HENT:  Right Ear: External ear normal.  Left Ear: External ear normal.  Nose: Nose normal.  Mouth/Throat: Oropharynx is clear and moist. No oropharyngeal exudate.  Eyes: Conjunctivae and EOM are normal. Pupils are equal, round, and reactive to light. No scleral icterus.  Neck: No JVD present. No tracheal deviation present. No thyromegaly present.  Cardiovascular: Normal rate, regular rhythm, normal heart sounds and intact distal pulses.  Exam reveals no gallop and no friction rub.   No murmur heard. Pulmonary/Chest: Effort normal. No respiratory distress. She has no wheezes. She has no rales. She exhibits no tenderness.  Abdominal: She exhibits no distension and no mass. There is no tenderness.  Musculoskeletal: Normal range of motion. She exhibits no edema or tenderness.  Tender in lower back. Unstable gait  Lymphadenopathy:    She has no cervical adenopathy.  Neurological: She is alert and oriented to person, place, and time.  No cranial nerve deficit. Coordination normal.  Skin: No rash noted. She is not diaphoretic. No erythema. No pallor.  Psychiatric: She has a normal mood and affect. Her behavior is normal. Judgment and thought content normal.    Labs reviewed:  Recent Labs  03/18/16 05/20/16 07/29/16  NA 141 139 142  K 4.0 4.2 4.8  BUN 28* 21 22*  CREATININE 0.8 0.8 1.0    Recent Labs  03/18/16 05/20/16 07/29/16  AST 26 24 19   ALT 19 19 12   ALKPHOS 65 60 59    Recent Labs  03/18/16 05/20/16 07/29/16  WBC 6.7 5.7 6.6  HGB 12.1 12.2 12.2  HCT 35* 38 37  PLT 202 194 172   Lab Results  Component Value Date   TSH 3.08 07/29/2016   Lab Results  Component Value Date   HGBA1C 5.7 03/18/2016   Lab Results  Component Value Date   CHOL 187 03/18/2016   HDL 53 03/18/2016   LDLCALC 121 03/18/2016   TRIG 66 03/18/2016    Significant Diagnostic Results in last 30 days:  No results  found.  Assessment/Plan There are no diagnoses linked to this encounter.HTN (hypertension) Blood pressure is controlled, taking Valsartan HCT 160/12.5mg  daily, taking Mega 3 and ASA 325mg  for CV risk reduction.    GERD (gastroesophageal reflux disease) Stable, continue Omeprazole 40mg  daily, prn Mylanta  Constipation Stable, continue Senna S II qhs   Overactive bladder 1-2/night, continue Ditropan 5mg  qhs    Lumbago 07/21/16 X-ray L hip, sacrum, coccyx, lumbar spine: no acute osseous abnormality evident.  Tramadol prn 07/24/16 pain left sacral region, travels to the posterior left thigh and knee. Norco tid 07/30/16 better pain control during day, worse at night, will increase Norco to with meals and hs. Pending spinal inj.  09/04/16 better, will taper off Norco, continue Tramadol for pain   Insomnia secondary to anxiety continue Lorazepam 0.5mg  prn daily, adding Cymbalta 30mg  po daily. Observe the patient. BMP 2 weeks.        Family/ staff Communication: continue IL   Labs/tests ordered: BMP 2 weeks

## 2016-09-04 NOTE — Assessment & Plan Note (Signed)
Stable, continue Omeprazole 40mg daily, prn Mylanta 

## 2016-09-04 NOTE — Assessment & Plan Note (Signed)
Blood pressure is controlled, taking Valsartan HCT 160/12.5mg daily, taking Mega 3 and ASA 325mg for CV risk reduction.   

## 2016-09-08 ENCOUNTER — Telehealth: Payer: Self-pay | Admitting: *Deleted

## 2016-09-08 NOTE — Telephone Encounter (Signed)
Return call attempted as requested. No answer. Plan discussion with patient should they return call to Population Health general line of [phone], option 1.

## 2016-09-08 NOTE — Telephone Encounter (Signed)
Patient called and stated that [facility] prescribed Cymbalta and she had a reaction to it. Caused Dizziness. Nurse at [facility] told her to stop it. Patient did and wants to know if something else needs to be prescribed or should she take it at a different time of day.  Patient also stated that she is phasing out the [place] also. Please Advise.   (can LMOM)

## 2016-09-08 NOTE — Telephone Encounter (Signed)
Just stop the Cymbalta. I would not use another medication at this time.

## 2016-09-09 NOTE — Telephone Encounter (Signed)
Patient Notified

## 2016-09-18 ENCOUNTER — Encounter: Payer: Self-pay | Admitting: Internal Medicine

## 2016-09-18 DIAGNOSIS — F419 Anxiety disorder, unspecified: Secondary | ICD-10-CM | POA: Diagnosis not present

## 2016-09-18 LAB — BASIC METABOLIC PANEL
BUN: 16 mg/dL (ref 4–21)
Creatinine: 0.7 mg/dL (ref 0.5–1.1)
GLUCOSE: 94 mg/dL
Potassium: 3.8 mmol/L (ref 3.4–5.3)
Sodium: 141 mmol/L (ref 137–147)

## 2016-09-22 ENCOUNTER — Other Ambulatory Visit: Payer: Self-pay | Admitting: *Deleted

## 2016-09-23 ENCOUNTER — Ambulatory Visit (INDEPENDENT_AMBULATORY_CARE_PROVIDER_SITE_OTHER): Payer: PPO | Admitting: Family Medicine

## 2016-09-23 VITALS — BP 150/81 | HR 74 | Temp 98.5°F | Resp 16 | Wt 164.0 lb

## 2016-09-23 DIAGNOSIS — R682 Dry mouth, unspecified: Secondary | ICD-10-CM | POA: Diagnosis not present

## 2016-09-23 DIAGNOSIS — R42 Dizziness and giddiness: Secondary | ICD-10-CM

## 2016-09-23 DIAGNOSIS — G4733 Obstructive sleep apnea (adult) (pediatric): Secondary | ICD-10-CM | POA: Diagnosis not present

## 2016-09-23 DIAGNOSIS — K143 Hypertrophy of tongue papillae: Secondary | ICD-10-CM | POA: Diagnosis not present

## 2016-09-23 LAB — POCT CBC
Granulocyte percent: 67.2 %G (ref 37–80)
HCT, POC: 36.7 % — AB (ref 37.7–47.9)
Hemoglobin: 12.7 g/dL (ref 12.2–16.2)
Lymph, poc: 2 (ref 0.6–3.4)
MCH: 31.1 pg (ref 27–31.2)
MCHC: 34.7 g/dL (ref 31.8–35.4)
MCV: 89.8 fL (ref 80–97)
MID (CBC): 0.7 (ref 0–0.9)
MPV: 6.6 fL (ref 0–99.8)
PLATELET COUNT, POC: 190 10*3/uL (ref 142–424)
POC Granulocyte: 5.5 (ref 2–6.9)
POC LYMPH %: 24.5 % (ref 10–50)
POC MID %: 8.3 %M (ref 0–12)
RBC: 4.09 M/uL (ref 4.04–5.48)
RDW, POC: 16.7 %
WBC: 8.2 10*3/uL (ref 4.6–10.2)

## 2016-09-23 LAB — GLUCOSE, POCT (MANUAL RESULT ENTRY): POC Glucose: 115 mg/dl — AB (ref 70–99)

## 2016-09-23 LAB — POCT SKIN KOH: Skin KOH, POC: POSITIVE

## 2016-09-23 MED ORDER — CLOTRIMAZOLE 10 MG MT TROC: 10.0000 mg | Freq: Every day | OROMUCOSAL | 0 refills | Status: DC

## 2016-09-23 NOTE — Patient Instructions (Signed)
You appear to have thrush or a yeast infection coating the tongue. It does not appear to be significantly swollen to me here today. Start the Mycelex medication 5 times per day, but if any increased swelling of your tongue, difficulty swallowing, or any worsening of your mouth symptoms, return here or emergency room.  Based on your evaluation today, you may need to drink more fluids. That may help your dry mouth, dizziness, and possibly the headache symptoms. If the headache and dizziness symptoms are not improving within the next day or 2 with increasing your fluid intake, follow-up with your current primary care provider, return here, or emergency room if acute worsening of your symptoms. Please take a copy of your EKG to your next visit with Dr. Nyoka Cowden.   Return to the clinic or go to the nearest emergency room if any of your symptoms worsen or new symptoms occur.  Thrush, Adult Ritta Slot, also called oral candidiasis, is a fungal infection that develops in the mouth and throat and on the tongue. It causes white patches to form on the mouth and tongue. Ritta Slot is most common in older adults, but it can occur at any age.  Many cases of thrush are mild, but this infection can also be more serious. Ritta Slot can be a recurring problem for people who have chronic illnesses or who take medicines that limit the body's ability to fight infection. Because these people have difficulty fighting infections, the fungus that causes thrush can spread throughout the body. This can cause life-threatening blood or organ infections. CAUSES  Ritta Slot is usually caused by a yeast called Candida albicans. This fungus is normally present in small amounts in the mouth and on other mucous membranes. It usually causes no harm. However, when conditions are present that allow the fungus to grow uncontrolled, it invades surrounding tissues and becomes an infection. Less often, other Candida species can also lead to thrush.  RISK  FACTORS Ritta Slot is more likely to develop in the following people:  People with an impaired ability to fight infection (weakened immune system).   Older adults.   People with HIV.   People with diabetes.   People with dry mouth (xerostomia).   Pregnant women.   People with poor dental care, especially those who have false teeth.   People who use antibiotic medicines.  SIGNS AND SYMPTOMS  Ritta Slot can be a mild infection that causes no symptoms. If symptoms develop, they may include:   A burning feeling in the mouth and throat. This can occur at the start of a thrush infection.   White patches that adhere to the mouth and tongue. The tissue around the patches may be red, raw, and painful. If rubbed (during tooth brushing, for example), the patches and the tissue of the mouth may bleed easily.   A bad taste in the mouth or difficulty tasting foods.   Cottony feeling in the mouth.   Pain during eating and swallowing. DIAGNOSIS  Your health care provider can usually diagnose thrush by looking in your mouth and asking you questions about your health.  TREATMENT  Medicines that help prevent the growth of fungi (antifungals) are the standard treatment for thrush. These medicines are either applied directly to the affected area (topical) or swallowed (oral). The treatment will depend on the severity of the condition.  Mild Thrush Mild cases of thrush may clear up with the use of an antifungal mouth rinse or lozenges. Treatment usually lasts about 14 days.  Moderate to  Severe Thrush  More severe thrush infections that have spread to the esophagus are treated with an oral antifungal medicine. A topical antifungal medicine may also be used.   For some severe infections, a treatment period longer than 14 days may be needed.   Oral antifungal medicines are almost never used during pregnancy because the fetus may be harmed. However, if a pregnant woman has a rare, severe thrush  infection that has spread to her blood, oral antifungal medicines may be used. In this case, the risk of harm to the mother and fetus from the severe thrush infection may be greater than the risk posed by the use of antifungal medicines.  Persistent or Recurrent Thrush For cases of thrush that do not go away or keep coming back, treatment may involve the following:   Treatment may be needed twice as long as the symptoms last.   Treatment will include both oral and topical antifungal medicines.   People with weakened immune systems can take an antifungal medicine on a continuous basis to prevent thrush infections.  It is important to treat conditions that make you more likely to get thrush, such as diabetes or HIV.  HOME CARE INSTRUCTIONS   Only take over-the-counter or prescription medicine as directed by your health care provider. Talk to your health care provider about an over-the-counter medicine called gentian violet, which kills bacteria and fungi.   Eat plain, unflavored yogurt as directed by your health care provider. Check the label to make sure the yogurt contains live cultures. This yogurt can help healthy bacteria grow in the mouth that can stop the growth of the fungus that causes thrush.   Try these measures to help reduce the discomfort of thrush:   Drink cold liquids such as water or iced tea.   Try flavored ice treats or frozen juices.   Eat foods that are easy to swallow, such as gelatin, ice cream, or custard.   If the patches in your mouth are painful, try drinking from a straw.   Rinse your mouth several times a day with a warm saltwater rinse. You can make the saltwater mixture with 1 tsp (6 g) of salt in 8 fl oz (0.2 L) of warm water.   If you wear dentures, remove the dentures before going to bed, brush them vigorously, and soak them in a cleaning solution as directed by your health care provider.   Women who are breastfeeding should clean their  nipples with an antifungal medicine as directed by their health care provider. Dry the nipples after breastfeeding. Applying lanolin-containing body lotion may help relieve nipple soreness.  SEEK MEDICAL CARE IF:  Your symptoms are getting worse or are not improving within 7 days of starting treatment.   You have symptoms of spreading infection, such as white patches on the skin outside of the mouth.   You are nursing and you have redness, burning, or pain in the nipples that is not relieved with treatment.  MAKE SURE YOU:  Understand these instructions.  Will watch your condition.  Will get help right away if you are not doing well or get worse.   This information is not intended to replace advice given to you by your health care provider. Make sure you discuss any questions you have with your health care provider.   Document Released: 09/09/2004 Document Revised: 01/05/2015 Document Reviewed: 07/18/2013 Elsevier Interactive Patient Education 2016 [employer]   Dizziness Dizziness is a common problem. It is a feeling of  unsteadiness or light-headedness. You may feel like you are about to faint. Dizziness can lead to injury if you stumble or fall. Anyone can become dizzy, but dizziness is more common in older adults. This condition can be caused by a number of things, including medicines, dehydration, or illness. HOME CARE INSTRUCTIONS Taking these steps may help with your condition: Eating and Drinking  Drink enough fluid to keep your urine clear or pale yellow. This helps to keep you from becoming dehydrated. Try to drink more clear fluids, such as water.  Do not drink alcohol.  Limit your caffeine intake if directed by your health care provider.  Limit your salt intake if directed by your health care provider. Activity  Avoid making quick movements.  Rise slowly from chairs and steady yourself until you feel okay.  In the morning, first sit up on the side of the  bed. When you feel okay, stand slowly while you hold onto something until you know that your balance is fine.  Move your legs often if you need to stand in one place for a long time. Tighten and relax your muscles in your legs while you are standing.  Do not drive or operate heavy machinery if you feel dizzy.  Avoid bending down if you feel dizzy. Place items in your home so that they are easy for you to reach without leaning over. Lifestyle  Do not use any tobacco products, including cigarettes, chewing tobacco, or electronic cigarettes. If you need help quitting, ask your health care provider.  Try to reduce your stress level, such as with yoga or meditation. Talk with your health care provider if you need help. General Instructions  Watch your dizziness for any changes.  Take medicines only as directed by your health care provider. Talk with your health care provider if you think that your dizziness is caused by a medicine that you are taking.  Tell a friend or a family member that you are feeling dizzy. If he or she notices any changes in your behavior, have this person call your health care provider.  Keep all follow-up visits as directed by your health care provider. This is important. SEEK MEDICAL CARE IF:  Your dizziness does not go away.  Your dizziness or light-headedness gets worse.  You feel nauseous.  You have reduced hearing.  You have new symptoms.  You are unsteady on your feet or you feel like the room is spinning. SEEK IMMEDIATE MEDICAL CARE IF:  You vomit or have diarrhea and are unable to eat or drink anything.  You have problems talking, walking, swallowing, or using your arms, hands, or legs.  You feel generally weak.  You are not thinking clearly or you have trouble forming sentences. It may take a friend or family member to notice this.  You have chest pain, abdominal pain, shortness of breath, or sweating.  Your vision changes.  You notice any  bleeding.  You have a headache.  You have neck pain or a stiff neck.  You have a fever.   This information is not intended to replace advice given to you by your health care provider. Make sure you discuss any questions you have with your health care provider.   Document Released: 06/10/2001 Document Revised: 05/01/2015 Document Reviewed: 12/11/2014 Elsevier Interactive Patient Education 2016 [employer].   IF you received an x-ray today, you will receive an invoice from [facility]. Please contact [facility] at [phone] with questions or concerns regarding your invoice.  IF you received labwork today, you will receive an invoice from [employer]. Please contact [facility] at [phone] with questions or concerns regarding your invoice.   Our billing staff will not be able to assist you with questions regarding bills from these companies.  You will be contacted with the lab results as soon as they are available. The fastest way to get your results is to activate your My Chart account. Instructions are located on the last page of this paperwork. If you have not heard from [place] regarding the results in 2 weeks, please contact this office.

## 2016-09-23 NOTE — Progress Notes (Signed)
Subjective:  By signing my name below, I, Moises Blood, attest that this documentation has been prepared under the direction and in the presence of Merri Ray, MD. Electronically Signed: Moises Blood, [place]. 09/23/2016 , 11:41 AM .  Patient was seen in Room 7 .   Patient ID: Jean [name], female    DOB: [birth year], 82 y.o.   MRN: [id number] Chief Complaint  Patient presents with  . Dizziness     x a few days "feels like tongue is coated and thick" able to eat and drink   HPI Jean [name] is a 82 y.o. female Here with tongue coating and complains of dizziness. H/o HTN, HLD, OSA, and GERD; PCP is Jeanmarie Hubert, MD; normal BMP 5 days ago. Normal TSH in August and normal hemoglobin in August. Phone note reviewed from sept 11th regarding cymbalta causing dizziness. She was advised to stop medication based on phone note that day.   Patient saw and felt coating on her tongue this morning. She feels like it's swollen, and sometimes causing her to have slurred speech. She's been having similar problems over the past few days. She is able to eat and drink. She denies facial droop, weakness, or other stroke-like symptoms. She denies any recent antibiotics.   She's been having continued sporadic "sloshy" dizziness. She stopped taking her cymbalta and her dizziness has improved. She had a headache this morning located in the back of her head. She denies heart problems in the past, or history of a stroke. She denies palpitations, or chest pain. She urinated 2~3 times today, but did not check the urine color.   Patient Active Problem List   Diagnosis Date Noted  . Constipation 07/30/2016  . Hearing loss 07/03/2016  . UTI (urinary tract infection) 05/29/2016  . Fatigue 04/17/2016  . Lumbago 04/05/2016  . Obstructive sleep apnea 04/05/2016  . Hyperlipidemia   . HTN (hypertension) 12/04/2015  . 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 GERD (gastroesophageal reflux disease) 12/04/2015  .  Overactive bladder 12/04/2015  . Abnormality of gait 12/04/2015   Past Medical History:  Diagnosis Date  . GERD (gastroesophageal reflux disease)   . Hx of cataract surgery    both eyes  . Hyperglycemia   . Hyperlipidemia   . Hypertension   . Lumbago 04/05/2016  . Macular degeneration    ? which eye(s)  . Obstructive sleep apnea   . Osteoporosis, senile   . Overactive bladder   . Squamous cell skin cancer 2/16   left lower leg   Past Surgical History:  Procedure Laterality Date  . ABDOMINAL HYSTERECTOMY  1960's  . APPENDECTOMY  1960's  . MOLE REMOVAL  2000   facial  . TONSILLECTOMY     Allergies  Allergen Reactions  . Adhesive [Tape]   . Ampicillin   . Nitrofuran Derivatives   . Sulfur    Prior to Admission medications   Medication Sig Start Date End Date Taking? Authorizing Provider  aspirin 325 MG tablet Take 325 mg by mouth daily.   Yes Historical Provider, MD  Calcium Carb-Cholecalciferol (CALCIUM 600+D3) 600-800 MG-UNIT TABS Take by mouth. Take one daily   Yes Historical Provider, MD  Coenzyme Q10 (COQ10) 100 MG CAPS Take by mouth. Take one tablet daily   Yes Historical Provider, MD  HYDROcodone-acetaminophen (NORCO/VICODIN) 5-325 MG tablet Take 1 tablet by mouth 3 (three) times daily.   Yes Historical Provider, MD  LORazepam (ATIVAN) 0.5 MG tablet Take 0.5 mg by mouth. Take one tablet  daily as needed for anxiety   Yes Historical Provider, MD  Multiple Vitamins-Minerals (ICAPS) CAPS Take by mouth. Take one I-Cap daily for eyes   Yes Historical Provider, MD  Omega-3 Fatty Acids (FISH OIL) 1200 MG CAPS Take by mouth. Take one daily   Yes Historical Provider, MD  omeprazole (PRILOSEC) 40 MG capsule Take one capsule 1/2 hour before breakfast for stomach acid 01/15/16  Yes Estill Dooms, MD  oxybutynin (DITROPAN) 5 MG tablet Take one tablet at bedtime for bladder 02/21/16  Yes Estill Dooms, MD  Polyethyl Glycol-Propyl Glycol (SYSTANE) 0.4-0.3 % GEL ophthalmic gel Place 1  application into both eyes. Apply to eye lids at bedtime   Yes Historical Provider, MD  Polyethyl Glycol-Propyl Glycol (SYSTANE) 0.4-0.3 % SOLN Apply to eye. One drop both eyes three times daily   Yes Historical Provider, MD  traMADol (ULTRAM) 50 MG tablet Take 1-2 tablets by mouth 4 (four) times daily as needed. 07/21/16  Yes Historical Provider, MD  valsartan-hydrochlorothiazide (DIOVAN-HCT) 160-12.5 MG tablet Take 1 tablet by mouth daily. For blood pressure 02/21/16  Yes Estill Dooms, MD  DULoxetine (CYMBALTA) 30 MG capsule Take 1 capsule (30 mg total) by mouth daily. 09/04/16   Man X Mast, NP   Social History   Social History  . Marital status: Widowed    Spouse name: N/A  . Number of children: N/A  . 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 Smoking status: Never Smoker  . Smokeless tobacco: Never Used  . Alcohol use Yes     Comment: occassionally 1/2 glass of wine 1-2 x month  . Drug use: No  . Sexual activity: No   Other Topics Concern  . Not on file   Social History Narrative   L[facility] at [facility] since 11/12/15   Widow   Never smoked   Alcohol occasionally 1/2 glass of wine   Exercise none   POA, Living Will   Walks with walker      Review of Systems  Constitutional: Negative for chills, fatigue and fever.  HENT: Negative for trouble swallowing.        Coating over tongue  Cardiovascular: Negative for chest pain and palpitations.  Gastrointestinal: Negative for diarrhea, nausea and vomiting.  Neurological: Positive for dizziness. Negative for syncope, facial asymmetry, speech difficulty, weakness, light-headedness and numbness.       Objective:   Physical Exam  Constitutional: She is oriented to person, place, and time. She appears well-developed and well-nourished. No distress.  HENT:  Head: Normocephalic and atraumatic.  Mouth/Throat: Mucous membranes are dry.  Slight white  coating across dorsum of her tongue; no coating over her oropharynx or buccal mucosa, no focal swelling in her mouth; tongue is somewhat prominent but equal on left and right side  Eyes: Pupils are equal, round, and reactive to light.  1 beat of horizontal nystagmus, did reproduce some dizziness with EOM testing  Neck: Neck supple. No thyromegaly present.  Cardiovascular: Normal rate.   Pulmonary/Chest: Effort normal. No stridor. No respiratory distress.  Musculoskeletal: Normal range of motion.  Lymphadenopathy:    She has no cervical adenopathy.  Neurological: She is alert and oriented to person, place, and time.  Non focal exam, no pronator drift  Skin: Skin is warm and dry.  Psychiatric: She has a normal mood and affect. Her behavior is normal.  Nursing note and vitals reviewed.  Vitals:   09/23/16 1043  BP: (!) 150/81  Pulse: 74  Resp: 16  Temp: 98.5 F (36.9 C)  TempSrc: Oral  SpO2: 97%  Weight: 164 lb (74.4 kg)   Orthostatic VS for the past 24 hrs (Last 3 readings):  BP- Lying Pulse- Lying BP- Sitting Pulse- Sitting BP- Standing at 0 minutes Pulse- Standing at 0 minutes  09/23/16 1146 163/81 69 151/79 72 150/70 89   EKG: incomplete RBBB and LAFB, no previous EKG for comparison  Results for orders placed or performed in visit on 09/23/16  POCT CBC  Result Value Ref Range   WBC 8.2 4.6 - 10.2 K/uL   Lymph, poc 2.0 0.6 - 3.4   POC LYMPH PERCENT 24.5 10 - 50 %L   MID (cbc) 0.7 0 - 0.9   POC MID % 8.3 0 - 12 %M   POC Granulocyte 5.5 2 - 6.9   Granulocyte percent 67.2 37 - 80 %G   RBC 4.09 4.04 - 5.48 M/uL   Hemoglobin 12.7 12.2 - 16.2 g/dL   HCT, POC 36.7 (A) 37.7 - 47.9 %   MCV 89.8 80 - 97 fL   MCH, POC 31.1 27 - 31.2 pg   MCHC 34.7 31.8 - 35.4 g/dL   RDW, POC 16.7 %   Platelet Count, POC 190 142 - 424 K/uL   MPV 6.6 0 - 99.8 fL  POCT glucose (manual entry)  Result Value Ref Range   POC Glucose 115 (A) 70 - 99 mg/dl  POCT Skin KOH  Result Value Ref Range     Skin KOH, POC Positive       Assessment & Plan:    Jean [name] is a 82 y.o. female Dizziness - Plan: POCT CBC, POCT glucose (manual entry), POCT Skin KOH, EKG 12-Lead, Orthostatic vital signs  - Borderline orthostatic, otherwise reassuring CBC, glucose, EKG with out any acute findings. Did advise to follow-up with PCP regarding right bundle branch block, but no concerning findings in office. Recommended increasing fluids, then if persistent symptoms, return here or PCP for further eval. ER precautions if worse.  Tongue coating - Plan: POCT Skin KOH Dry mouth - Plan: POCT glucose (manual entry)  -Dry mouth likely due to above symptoms. Does appear to have thrush, start Mycelex troches, RTC precautions if persistent or recurrent.  Meds ordered this encounter  Medications  . clotrimazole (MYCELEX) 10 MG troche    Sig: Take 1 tablet (10 mg total) by mouth 5 (five) times daily.    Dispense:  70 tablet    Refill:  0   Patient Instructions    You appear to have thrush or a yeast infection coating the tongue. It does not appear to be significantly swollen to me here today. Start the Mycelex medication 5 times per day, but if any increased swelling of your tongue, difficulty swallowing, or any worsening of your mouth symptoms, return here or emergency room.  Based on your evaluation today, you may need to drink more fluids. That may help your dry mouth, dizziness, and possibly the headache symptoms. If the headache and dizziness symptoms are not improving within the next day or 2 with increasing your fluid intake, follow-up with your current primary care provider, return here, or emergency room if acute worsening of your symptoms. Please take a copy of your EKG to your next visit with Dr. Nyoka Cowden.   Return to the clinic or go to the nearest emergency room if any of your symptoms worsen  or new symptoms occur.  Thrush, Adult Ritta Slot, also called oral candidiasis, is a fungal infection that  develops in the mouth and throat and on the tongue. It causes white patches to form on the mouth and tongue. Ritta Slot is most common in older adults, but it can occur at any age.  Many cases of thrush are mild, but this infection can also be more serious. Ritta Slot can be a recurring problem for people who have chronic illnesses or who take medicines that limit the body's ability to fight infection. Because these people have difficulty fighting infections, the fungus that causes thrush can spread throughout the body. This can cause life-threatening blood or organ infections. CAUSES  Ritta Slot is usually caused by a yeast called Candida albicans. This fungus is normally present in small amounts in the mouth and on other mucous membranes. It usually causes no harm. However, when conditions are present that allow the fungus to grow uncontrolled, it invades surrounding tissues and becomes an infection. Less often, other Candida species can also lead to thrush.  RISK FACTORS Ritta Slot is more likely to develop in the following people:  People with an impaired ability to fight infection (weakened immune system).   Older adults.   People with HIV.   People with diabetes.   People with dry mouth (xerostomia).   Pregnant women.   People with poor dental care, especially those who have false teeth.   People who use antibiotic medicines.  SIGNS AND SYMPTOMS  Ritta Slot can be a mild infection that causes no symptoms. If symptoms develop, they may include:   A burning feeling in the mouth and throat. This can occur at the start of a thrush infection.   White patches that adhere to the mouth and tongue. The tissue around the patches may be red, raw, and painful. If rubbed (during tooth brushing, for example), the patches and the tissue of the mouth may bleed easily.   A bad taste in the mouth or difficulty tasting foods.   Cottony feeling in the mouth.   Pain during eating and swallowing. DIAGNOSIS   Your health care provider can usually diagnose thrush by looking in your mouth and asking you questions about your health.  TREATMENT  Medicines that help prevent the growth of fungi (antifungals) are the standard treatment for thrush. These medicines are either applied directly to the affected area (topical) or swallowed (oral). The treatment will depend on the severity of the condition.  Mild Thrush Mild cases of thrush may clear up with the use of an antifungal mouth rinse or lozenges. Treatment usually lasts about 14 days.  Moderate to Severe Thrush  More severe thrush infections that have spread to the esophagus are treated with an oral antifungal medicine. A topical antifungal medicine may also be used.   For some severe infections, a treatment period longer than 14 days may be needed.   Oral antifungal medicines are almost never used during pregnancy because the fetus may be harmed. However, if a pregnant woman has a rare, severe thrush infection that has spread to her blood, oral antifungal medicines may be used. In this case, the risk of harm to the mother and fetus from the severe thrush infection may be greater than the risk posed by the use of antifungal medicines.  Persistent or Recurrent Thrush For cases of thrush that do not go away or keep coming back, treatment may involve the following:   Treatment may be needed twice as long as the symptoms  last.   Treatment will include both oral and topical antifungal medicines.   People with weakened immune systems can take an antifungal medicine on a continuous basis to prevent thrush infections.  It is important to treat conditions that make you more likely to get thrush, such as diabetes or HIV.  HOME CARE INSTRUCTIONS   Only take over-the-counter or prescription medicine as directed by your health care provider. Talk to your health care provider about an over-the-counter medicine called gentian violet, which kills bacteria and  fungi.   Eat plain, unflavored yogurt as directed by your health care provider. Check the label to make sure the yogurt contains live cultures. This yogurt can help healthy bacteria grow in the mouth that can stop the growth of the fungus that causes thrush.   Try these measures to help reduce the discomfort of thrush:   Drink cold liquids such as water or iced tea.   Try flavored ice treats or frozen juices.   Eat foods that are easy to swallow, such as gelatin, ice cream, or custard.   If the patches in your mouth are painful, try drinking from a straw.   Rinse your mouth several times a day with a warm saltwater rinse. You can make the saltwater mixture with 1 tsp (6 g) of salt in 8 fl oz (0.2 L) of warm water.   If you wear dentures, remove the dentures before going to bed, brush them vigorously, and soak them in a cleaning solution as directed by your health care provider.   Women who are breastfeeding should clean their nipples with an antifungal medicine as directed by their health care provider. Dry the nipples after breastfeeding. Applying lanolin-containing body lotion may help relieve nipple soreness.  SEEK MEDICAL CARE IF:  Your symptoms are getting worse or are not improving within 7 days of starting treatment.   You have symptoms of spreading infection, such as white patches on the skin outside of the mouth.   You are nursing and you have redness, burning, or pain in the nipples that is not relieved with treatment.  MAKE SURE YOU:  Understand these instructions.  Will watch your condition.  Will get help right away if you are not doing well or get worse.   This information is not intended to replace advice given to you by your health care provider. Make sure you discuss any questions you have with your health care provider.   Document Released: 09/09/2004 Document Revised: 01/05/2015 Document Reviewed: 07/18/2013 Elsevier Interactive Patient Education  2016 Elsevier Inc.   Dizziness Dizziness is a common problem. It is a feeling of unsteadiness or light-headedness. You may feel like you are about to faint. Dizziness can lead to injury if you stumble or fall. Anyone can become dizzy, but dizziness is more common in older adults. This condition can be caused by a number of things, including medicines, dehydration, or illness. HOME CARE INSTRUCTIONS Taking these steps may help with your condition: Eating and Drinking  Drink enough fluid to keep your urine clear or pale yellow. This helps to keep you from becoming dehydrated. Try to drink more clear fluids, such as water.  Do not drink alcohol.  Limit your caffeine intake if directed by your health care provider.  Limit your salt intake if directed by your health care provider. Activity  Avoid making quick movements.  Rise slowly from chairs and steady yourself until you feel okay.  In the morning, first sit up on the side  of the bed. When you feel okay, stand slowly while you hold onto something until you know that your balance is fine.  Move your legs often if you need to stand in one place for a long time. Tighten and relax your muscles in your legs while you are standing.  Do not drive or operate heavy machinery if you feel dizzy.  Avoid bending down if you feel dizzy. Place items in your home so that they are easy for you to reach without leaning over. Lifestyle  Do not use any tobacco products, including cigarettes, chewing tobacco, or electronic cigarettes. If you need help quitting, ask your health care provider.  Try to reduce your stress level, such as with yoga or meditation. Talk with your health care provider if you need help. General Instructions  Watch your dizziness for any changes.  Take medicines only as directed by your health care provider. Talk with your health care provider if you think that your dizziness is caused by a medicine that you are taking.  Tell  a friend or a family member that you are feeling dizzy. If he or she notices any changes in your behavior, have this person call your health care provider.  Keep all follow-up visits as directed by your health care provider. This is important. SEEK MEDICAL CARE IF:  Your dizziness does not go away.  Your dizziness or light-headedness gets worse.  You feel nauseous.  You have reduced hearing.  You have new symptoms.  You are unsteady on your feet or you feel like the room is spinning. SEEK IMMEDIATE MEDICAL CARE IF:  You vomit or have diarrhea and are unable to eat or drink anything.  You have problems talking, walking, swallowing, or using your arms, hands, or legs.  You feel generally weak.  You are not thinking clearly or you have trouble forming sentences. It may take a friend or family member to notice this.  You have chest pain, abdominal pain, shortness of breath, or sweating.  Your vision changes.  You notice any bleeding.  You have a headache.  You have neck pain or a stiff neck.  You have a fever.   This information is not intended to replace advice given to you by your health care provider. Make sure you discuss any questions you have with your health care provider.   Document Released: 06/10/2001 Document Revised: 05/01/2015 Document Reviewed: 12/11/2014 Elsevier Interactive Patient Education 2016 [employer].   IF you received an x-ray today, you will receive an invoice from [facility]. Please contact [facility] at [phone] with questions or concerns regarding your invoice.   IF you received labwork today, you will receive an invoice from [employer]. Please contact [facility] at [phone] with questions or concerns regarding your invoice.   Our billing staff will not be able to assist you with questions regarding bills from these companies.  You will be contacted with the lab results as soon as they  are available. The fastest way to get your results is to activate your My Chart account. Instructions are located on the last page of this paperwork. If you have not heard from [place] regarding the results in 2 weeks, please contact this office.        I personally performed the services described in this documentation, which was scribed in my presence. The recorded information has been reviewed and considered, and addended by me as needed.   Signed,   Merri Ray, MD [facility].  09/24/16 3:19 PM

## 2016-10-08 DIAGNOSIS — N8110 Cystocele, unspecified: Secondary | ICD-10-CM | POA: Diagnosis not present

## 2016-10-16 DIAGNOSIS — L821 Other seborrheic keratosis: Secondary | ICD-10-CM | POA: Diagnosis not present

## 2016-10-16 DIAGNOSIS — L814 Other melanin hyperpigmentation: Secondary | ICD-10-CM | POA: Diagnosis not present

## 2016-10-16 DIAGNOSIS — D3611 Benign neoplasm of peripheral nerves and autonomic nervous system of face, head, and neck: Secondary | ICD-10-CM | POA: Diagnosis not present

## 2016-10-16 DIAGNOSIS — L820 Inflamed seborrheic keratosis: Secondary | ICD-10-CM | POA: Diagnosis not present

## 2016-11-12 ENCOUNTER — Other Ambulatory Visit: Payer: Self-pay

## 2016-11-12 DIAGNOSIS — R29898 Other symptoms and signs involving the musculoskeletal system: Secondary | ICD-10-CM | POA: Diagnosis not present

## 2016-11-12 DIAGNOSIS — M6281 Muscle weakness (generalized): Secondary | ICD-10-CM | POA: Diagnosis not present

## 2016-11-12 DIAGNOSIS — R32 Unspecified urinary incontinence: Secondary | ICD-10-CM | POA: Diagnosis not present

## 2016-11-12 MED ORDER — OXYBUTYNIN CHLORIDE 5 MG PO TABS: ORAL_TABLET | ORAL | 3 refills | Status: DC

## 2016-11-17 ENCOUNTER — Other Ambulatory Visit: Payer: Self-pay

## 2016-11-17 ENCOUNTER — Other Ambulatory Visit: Payer: Self-pay | Admitting: Internal Medicine

## 2016-11-17 MED ORDER — TRAMADOL HCL 50 MG PO TABS: ORAL_TABLET | ORAL | 0 refills | Status: DC

## 2016-11-17 NOTE — Telephone Encounter (Signed)
[facility]  

## 2016-11-19 ENCOUNTER — Other Ambulatory Visit: Payer: Self-pay | Admitting: *Deleted

## 2016-11-19 DIAGNOSIS — E785 Hyperlipidemia, unspecified: Secondary | ICD-10-CM

## 2016-11-19 DIAGNOSIS — I10 Essential (primary) hypertension: Secondary | ICD-10-CM

## 2016-12-01 DIAGNOSIS — R29898 Other symptoms and signs involving the musculoskeletal system: Secondary | ICD-10-CM | POA: Diagnosis not present

## 2016-12-01 DIAGNOSIS — M6281 Muscle weakness (generalized): Secondary | ICD-10-CM | POA: Diagnosis not present

## 2016-12-01 DIAGNOSIS — R32 Unspecified urinary incontinence: Secondary | ICD-10-CM | POA: Diagnosis not present

## 2016-12-03 ENCOUNTER — Other Ambulatory Visit: Payer: Self-pay | Admitting: *Deleted

## 2016-12-03 MED ORDER — VALSARTAN-HYDROCHLOROTHIAZIDE 160-12.5 MG PO TABS: 1.0000 | ORAL_TABLET | Freq: Every day | ORAL | 3 refills | Status: DC

## 2016-12-03 NOTE — Telephone Encounter (Signed)
[facility]  

## 2016-12-11 ENCOUNTER — Encounter: Payer: Self-pay | Admitting: Nurse Practitioner

## 2016-12-11 ENCOUNTER — Non-Acute Institutional Stay: Payer: PPO | Admitting: Nurse Practitioner

## 2016-12-11 DIAGNOSIS — N3281 Overactive bladder: Secondary | ICD-10-CM

## 2016-12-11 DIAGNOSIS — I10 Essential (primary) hypertension: Secondary | ICD-10-CM | POA: Diagnosis not present

## 2016-12-11 DIAGNOSIS — R609 Edema, unspecified: Secondary | ICD-10-CM

## 2016-12-11 DIAGNOSIS — K5900 Constipation, unspecified: Secondary | ICD-10-CM | POA: Diagnosis not present

## 2016-12-11 DIAGNOSIS — K219 Gastro-esophageal reflux disease without esophagitis: Secondary | ICD-10-CM | POA: Diagnosis not present

## 2016-12-11 DIAGNOSIS — M5440 Lumbago with sciatica, unspecified side: Secondary | ICD-10-CM

## 2016-12-11 DIAGNOSIS — F419 Anxiety disorder, unspecified: Secondary | ICD-10-CM | POA: Diagnosis not present

## 2016-12-11 DIAGNOSIS — F5105 Insomnia due to other mental disorder: Secondary | ICD-10-CM | POA: Diagnosis not present

## 2016-12-11 NOTE — Assessment & Plan Note (Signed)
Sleeps well at night, occasionally use Lorazepam.  

## 2016-12-11 NOTE — Assessment & Plan Note (Signed)
continue Tramadol for pain  

## 2016-12-11 NOTE — Progress Notes (Signed)
Location:   [facility] (12) Provider:  Ansar Skoda  NP   Patient Care Team: Estill Dooms, MD as PCP - General (Internal Medicine) Kashis Penley Otho Darner, NP as Nurse Practitioner (Internal Medicine)  Extended Emergency Contact Information Primary Emergency Contact: [facility] Address: [address]          [place], [place] [address] Johnnette Litter of [place] Phone: [phone] Mobile Phone: [phone] Relation: Son  Code Status:   Goals of care: Advanced Directive information Advanced Directives 12/11/2016  Does Patient Have a Medical Advance Directive? Yes  Type of Paramedic of [place];Living will  Does patient want to make changes to medical advance directive? No - Patient declined  Copy of [place] in Chart? Yes     Chief Complaint  Patient presents with  . Medical Management of Chronic Issues    3 mo f/u , right leg and foot swelling    HPI:  Pt is a 82 y.o. female seen today for an acute visit for right leg and foot swelling. Denied pain, still able to ambulates with walker.   Hx of  anxiety, Lorazepam prn, improved,  back pain, s/p Ortho consult, s/p spinal inj. Pain is controlled with Tramadol prn  Hx of overactive bladder, taking Ditropan 5mg  daily. Blood pressure is controlled on Diovan-Hct 160/12.5mg . GERD stable on Omeprazole 40mg  daily.   Past Medical History:  Diagnosis Date  . GERD (gastroesophageal reflux disease)   . Hx of cataract surgery    both eyes  . Hyperglycemia   . Hyperlipidemia   . Hypertension   . Lumbago 04/05/2016  . Macular degeneration    ? which eye(s)  . Obstructive sleep apnea   . Osteoporosis, senile   . Overactive bladder   . Squamous cell skin cancer 2/16   left lower leg   Past Surgical History:  Procedure Laterality Date  . ABDOMINAL HYSTERECTOMY  1960's  . APPENDECTOMY  1960's  . MOLE REMOVAL  2000   facial  . TONSILLECTOMY       Allergies  Allergen Reactions  . Adhesive [Tape]   . Ampicillin   . Nitrofuran Derivatives   . Sulfur     Allergies as of 12/11/2016      Reactions   Adhesive [tape]    Ampicillin    Nitrofuran Derivatives    Sulfur       Medication List       Accurate as of 12/11/16  3:36 PM. Always use your most recent med list.          aspirin 325 MG tablet Take 325 mg by mouth daily.   CALCIUM 600+D3 600-800 MG-UNIT Tabs Generic drug:  Calcium Carb-Cholecalciferol Take by mouth. Take one daily   clotrimazole 10 MG troche Commonly known as:  MYCELEX Take 1 tablet (10 mg total) by mouth 5 (five) times daily.   Fish Oil 1200 MG Caps Take by mouth. Take one daily   ICAPS Caps Take by mouth. Take one I-Cap daily for eyes   LORazepam 0.5 MG tablet Commonly known as:  ATIVAN Take 0.5 mg by mouth. Take one tablet daily as needed for anxiety   omeprazole 40 MG capsule Commonly known as:  PRILOSEC Take one capsule 1/2 hour before breakfast for stomach acid   oxybutynin 5 MG tablet Commonly known as:  DITROPAN Take one tablet at bedtime for bladder   SYSTANE 0.4-0.3 % Soln Generic  drug:  Polyethyl Glycol-Propyl Glycol Apply to eye. One drop both eyes three times daily   SYSTANE 0.4-0.3 % Gel ophthalmic gel Generic drug:  Polyethyl Glycol-Propyl Glycol Place 1 application into both eyes. Apply to eye lids at bedtime   traMADol 50 MG tablet Commonly known as:  ULTRAM TAKE 1 OR 2 TABLETS EVERY FOUR HOURS AS NEEDED.   valsartan-hydrochlorothiazide 160-12.5 MG tablet Commonly known as:  DIOVAN-HCT Take 1 tablet by mouth daily. For blood pressure       Review of Systems  Constitutional: Negative for activity change, appetite change, chills, diaphoresis, fatigue, fever and unexpected weight change.       Fatigue, nausea, weakness, sweating, anxiety  HENT: Negative for congestion, ear discharge, ear pain, hearing loss, postnasal drip, rhinorrhea, sore throat,  tinnitus, trouble swallowing and voice change.   Eyes: Negative for pain, redness, itching and visual disturbance.       Macular degeneration  Respiratory: Negative for cough, choking, shortness of breath and wheezing.        History of obstructive sleep apnea  Cardiovascular: Negative for chest pain, palpitations and leg swelling.  Gastrointestinal: Positive for constipation. Negative for abdominal distention, abdominal pain, diarrhea and nausea.       History GERD and reflux  Endocrine: Negative for cold intolerance, heat intolerance, polydipsia, polyphagia and polyuria.  Genitourinary: Negative for difficulty urinating, dysuria, flank pain, frequency, hematuria, pelvic pain, urgency and vaginal discharge.       Incontinent of urine. Has pessary.  Musculoskeletal: Positive for arthralgias and gait problem. Negative for back pain, myalgias, neck pain and neck stiffness.  Skin: Negative for color change, pallor and rash.  Allergic/Immunologic: Negative.   Neurological: Negative for dizziness, tremors, seizures, syncope, weakness, numbness and headaches.  Hematological: Negative for adenopathy. Does not bruise/bleed easily.  Psychiatric/Behavioral: Positive for sleep disturbance (insomnia). Negative for agitation, behavioral problems, confusion, dysphoric mood, hallucinations and suicidal ideas. The patient is nervous/anxious. The patient is not hyperactive.     Immunization History  Administered Date(s) Administered  . Influenza-Unspecified 09/27/2015  . Pneumococcal Conjugate-13 01/23/2015  . Pneumococcal Polysaccharide-23 10/20/2007  . Td 12/14/2012   Pertinent  Health Maintenance Due  Topic Date Due  . DEXA SCAN  01/23/1991  . INFLUENZA VACCINE  07/29/2016  . PNA vac Low Risk Adult  Completed   Fall Risk  09/23/2016 05/29/2016 04/17/2016 03/27/2016 11/29/2015  Falls in the past year? No No No No No   Functional Status Survey:    Vitals:   12/11/16 1453  BP: 102/66  Pulse: 85   Resp: 20  Temp: 98.4 F (36.9 C)  Weight: 177 lb (80.3 kg)  Height: 5\' 5"  (1.651 m)   Body mass index is 29.45 kg/m. Physical Exam  Constitutional: She is oriented to person, place, and time. She appears well-developed and well-nourished. No distress.  HENT:  Right Ear: External ear normal.  Left Ear: External ear normal.  Nose: Nose normal.  Mouth/Throat: Oropharynx is clear and moist. No oropharyngeal exudate.  Eyes: Conjunctivae and EOM are normal. Pupils are equal, round, and reactive to light. No scleral icterus.  Neck: No JVD present. No tracheal deviation present. No thyromegaly present.  Cardiovascular: Normal rate, regular rhythm, normal heart sounds and intact distal pulses.  Exam reveals no gallop and no friction rub.   No murmur heard. Pulmonary/Chest: Effort normal. No respiratory distress. She has no wheezes. She has no rales. She exhibits no tenderness.  Abdominal: She exhibits no distension and no mass. There is  no tenderness.  Musculoskeletal: Normal range of motion. She exhibits no edema or tenderness.  Tender in lower back. Unstable gait  Lymphadenopathy:    She has no cervical adenopathy.  Neurological: She is alert and oriented to person, place, and time. No cranial nerve deficit. Coordination normal.  Skin: No rash noted. She is not diaphoretic. No erythema. No pallor.  Psychiatric: She has a normal mood and affect. Her behavior is normal. Judgment and thought content normal.    Labs reviewed:  Recent Labs  05/20/16 07/29/16 09/18/16  NA 139 142 141  K 4.2 4.8 3.8  BUN 21 22* 16  CREATININE 0.8 1.0 0.7    Recent Labs  03/18/16 05/20/16 07/29/16  AST 26 24 19   ALT 19 19 12   ALKPHOS 65 60 59    Recent Labs  03/18/16 05/20/16 07/29/16 09/23/16 1206  WBC 6.7 5.7 6.6 8.2  HGB 12.1 12.2 12.2 12.7  HCT 35* 38 37 36.7*  MCV  --   --   --  89.8  PLT 202 194 172  --    Lab Results  Component Value Date   TSH 3.08 07/29/2016   Lab Results   Component Value Date   HGBA1C 5.7 03/18/2016   Lab Results  Component Value Date   CHOL 187 03/18/2016   HDL 53 03/18/2016   LDLCALC 121 03/18/2016   TRIG 66 03/18/2016    Significant Diagnostic Results in last 30 days:  No results found.  Assessment/Plan HTN (hypertension) Blood pressure is controlled, taking Valsartan HCT 160/12.5mg  daily, taking Mega 3 and ASA 325mg  for CV risk reduction.   GERD (gastroesophageal reflux disease) Stable, continue Omeprazole 40mg  daily, prn Mylanta   Constipation Stable, diet controlled.    Overactive bladder 1-2/night, continue Ditropan 5mg  qhs    Lumbago continue Tramadol for pain   Insomnia secondary to anxiety Sleeps well at night, occasionally use Lorazepam.   Edema Right leg, mild, new, denied SOB, nocturnal paroxysmal dyspnea, palpitation, chest pressure or discomfort. Denied change of bowel habit, weight loss about # 10Ibs in the past year, will update  CBC CMP TSH BNP, may consider abd CT to r/o intraabdominal process.  Family/ staff Communication: continue [place]   Labs/tests ordered: CBC CMP TSH BNP

## 2016-12-11 NOTE — Assessment & Plan Note (Signed)
Stable, continue Omeprazole 40mg daily, prn Mylanta 

## 2016-12-11 NOTE — Assessment & Plan Note (Addendum)
Right leg, mild, new, denied SOB, nocturnal paroxysmal dyspnea, palpitation, chest pressure or discomfort. Denied change of bowel habit, weight loss about # 10Ibs in the past year, will update  CBC CMP TSH BNP, may consider abd CT to r/o intraabdominal process.

## 2016-12-11 NOTE — Assessment & Plan Note (Signed)
Blood pressure is controlled, taking Valsartan HCT 160/12.5mg daily, taking Mega 3 and ASA 325mg for CV risk reduction.   

## 2016-12-11 NOTE — Assessment & Plan Note (Signed)
1-2/night, continue Ditropan 5mg qhs 

## 2016-12-11 NOTE — Assessment & Plan Note (Signed)
Stable, diet controlled  

## 2016-12-24 ENCOUNTER — Other Ambulatory Visit: Payer: Self-pay

## 2016-12-24 ENCOUNTER — Other Ambulatory Visit: Payer: Self-pay | Admitting: *Deleted

## 2016-12-24 DIAGNOSIS — E785 Hyperlipidemia, unspecified: Secondary | ICD-10-CM

## 2016-12-24 DIAGNOSIS — I10 Essential (primary) hypertension: Secondary | ICD-10-CM

## 2016-12-24 DIAGNOSIS — E038 Other specified hypothyroidism: Secondary | ICD-10-CM

## 2017-01-01 ENCOUNTER — Encounter: Payer: Self-pay | Admitting: Internal Medicine

## 2017-01-07 ENCOUNTER — Other Ambulatory Visit: Payer: Self-pay

## 2017-01-07 DIAGNOSIS — I10 Essential (primary) hypertension: Secondary | ICD-10-CM | POA: Diagnosis not present

## 2017-01-07 DIAGNOSIS — E038 Other specified hypothyroidism: Secondary | ICD-10-CM | POA: Diagnosis not present

## 2017-01-07 DIAGNOSIS — E785 Hyperlipidemia, unspecified: Secondary | ICD-10-CM

## 2017-01-08 DIAGNOSIS — R29898 Other symptoms and signs involving the musculoskeletal system: Secondary | ICD-10-CM | POA: Diagnosis not present

## 2017-01-08 DIAGNOSIS — M6281 Muscle weakness (generalized): Secondary | ICD-10-CM | POA: Diagnosis not present

## 2017-01-08 DIAGNOSIS — M545 Low back pain: Secondary | ICD-10-CM | POA: Diagnosis not present

## 2017-01-08 DIAGNOSIS — R32 Unspecified urinary incontinence: Secondary | ICD-10-CM | POA: Diagnosis not present

## 2017-01-08 LAB — COMPREHENSIVE METABOLIC PANEL
ALBUMIN: 3.7 g/dL (ref 3.6–5.1)
ALT: 15 U/L (ref 6–29)
AST: 23 U/L (ref 10–35)
Alkaline Phosphatase: 71 U/L (ref 33–130)
BILIRUBIN TOTAL: 0.4 mg/dL (ref 0.2–1.2)
BUN: 26 mg/dL — ABNORMAL HIGH (ref 7–25)
CHLORIDE: 104 mmol/L (ref 98–110)
CO2: 25 mmol/L (ref 20–31)
CREATININE: 0.81 mg/dL (ref 0.60–0.88)
Calcium: 9.4 mg/dL (ref 8.6–10.4)
Glucose, Bld: 99 mg/dL (ref 65–99)
Potassium: 3.9 mmol/L (ref 3.5–5.3)
SODIUM: 140 mmol/L (ref 135–146)
TOTAL PROTEIN: 6.1 g/dL (ref 6.1–8.1)

## 2017-01-08 LAB — LIPID PANEL
CHOL/HDL RATIO: 3.3 ratio (ref <5.0)
Cholesterol: 189 mg/dL (ref <200)
HDL: 57 mg/dL (ref >50)
LDL CALC: 120 mg/dL — AB (ref <100)
Triglycerides: 58 mg/dL (ref <150)
VLDL: 12 mg/dL (ref <30)

## 2017-01-08 LAB — CBC
HCT: 36.6 % (ref 35.0–45.0)
Hemoglobin: 11.9 g/dL (ref 11.7–15.5)
MCH: 29.8 pg (ref 27.0–33.0)
MCHC: 32.5 g/dL (ref 32.0–36.0)
MCV: 91.5 fL (ref 80.0–100.0)
MPV: 8.9 fL (ref 7.5–12.5)
PLATELETS: 229 10*3/uL (ref 140–400)
RBC: 4 MIL/uL (ref 3.80–5.10)
RDW: 14.6 % (ref 11.0–15.0)
WBC: 6.6 10*3/uL (ref 3.8–10.8)

## 2017-01-08 LAB — TSH: TSH: 3.4 m[IU]/L

## 2017-01-08 LAB — BRAIN NATRIURETIC PEPTIDE: BRAIN NATRIURETIC PEPTIDE: 25.6 pg/mL (ref <100)

## 2017-01-15 ENCOUNTER — Encounter: Payer: Self-pay | Admitting: Internal Medicine

## 2017-01-20 DIAGNOSIS — M6281 Muscle weakness (generalized): Secondary | ICD-10-CM | POA: Diagnosis not present

## 2017-01-20 DIAGNOSIS — G4733 Obstructive sleep apnea (adult) (pediatric): Secondary | ICD-10-CM | POA: Diagnosis not present

## 2017-01-20 DIAGNOSIS — R2681 Unsteadiness on feet: Secondary | ICD-10-CM | POA: Diagnosis not present

## 2017-01-20 DIAGNOSIS — M545 Low back pain: Secondary | ICD-10-CM | POA: Diagnosis not present

## 2017-01-20 DIAGNOSIS — M5106 Intervertebral disc disorders with myelopathy, lumbar region: Secondary | ICD-10-CM | POA: Diagnosis not present

## 2017-01-22 ENCOUNTER — Encounter: Payer: Self-pay | Admitting: Nurse Practitioner

## 2017-01-30 DIAGNOSIS — M545 Low back pain: Secondary | ICD-10-CM | POA: Diagnosis not present

## 2017-01-30 DIAGNOSIS — M6281 Muscle weakness (generalized): Secondary | ICD-10-CM | POA: Diagnosis not present

## 2017-01-30 DIAGNOSIS — R32 Unspecified urinary incontinence: Secondary | ICD-10-CM | POA: Diagnosis not present

## 2017-01-30 DIAGNOSIS — R29898 Other symptoms and signs involving the musculoskeletal system: Secondary | ICD-10-CM | POA: Diagnosis not present

## 2017-02-05 ENCOUNTER — Encounter: Payer: Self-pay | Admitting: Nurse Practitioner

## 2017-02-05 ENCOUNTER — Other Ambulatory Visit: Payer: Self-pay | Admitting: *Deleted

## 2017-02-05 ENCOUNTER — Non-Acute Institutional Stay: Payer: PPO | Admitting: Nurse Practitioner

## 2017-02-05 VITALS — BP 130/76 | HR 82 | Temp 98.6°F | Resp 18 | Ht 65.0 in | Wt 179.8 lb

## 2017-02-05 DIAGNOSIS — H9190 Unspecified hearing loss, unspecified ear: Secondary | ICD-10-CM

## 2017-02-05 DIAGNOSIS — R1030 Lower abdominal pain, unspecified: Secondary | ICD-10-CM

## 2017-02-05 NOTE — Assessment & Plan Note (Signed)
Stable, continue Omeprazole 40mg daily, prn Mylanta 

## 2017-02-05 NOTE — Assessment & Plan Note (Signed)
Right leg, mild, denied SOB, nocturnal paroxysmal dyspnea, palpitation, chest pressure or discomfort. Denied change of bowel habit, weight loss about # 10Ibs in the past year, , may consider abd CT to r/o intraabdominal process.                                                                                                                                                                    01/07/17 Hgb 11.9, Na 140, K 3.9, Bun 26, creat 0.81, TSH 3.4, BNP 25.6

## 2017-02-05 NOTE — Progress Notes (Signed)
Location:   [facility] (12)   Provider:  Kashten Gowin  NP   Patient Care Team: Estill Dooms, MD as PCP - General (Internal Medicine) Balthazar Dooly Otho Darner, NP as Nurse Practitioner (Internal Medicine)  Extended Emergency Contact Information Primary Emergency Contact: [facility] Address: [address]          [place], [place] [address] Johnnette Litter of [place] Phone: [phone] Mobile Phone: [phone] Relation: Son  Code Status:   Goals of care: Advanced Directive information Advanced Directives 02/05/2017  Does Patient Have a Medical Advance Directive? Yes  Type of Paramedic of [place];Living will  Does patient want to make changes to medical advance directive? No - Patient declined  Copy of [place] in Chart? Yes     Chief Complaint  Patient presents with  . Medical Management of Chronic Issues    Ear referral, right leg swelling, ? UTI    HPI:  Pt is a 83 y.o. female seen today for [facility], desires Audiology referral. C/o urinary frequency, denied dysuria, suprapubic or CVA tenderness, denied N/V/D, she is afebrile.   Hx of right leg and foot swelling. Denied pain, still able to ambulates with walker.   01/07/17 Hgb 11.9, Na 140, K 3.9, Bun 26, creat 0.81, TSH 3.4, BNP 25.6  Hx of  anxiety, Lorazepam prn, improved,  back pain, s/p Ortho consult, s/p spinal inj. Pain is controlled with Tramadol prn  Hx of overactive bladder, taking Ditropan 5mg  daily. Blood pressure is controlled on Diovan-Hct 160/12.5mg . GERD stable on Omeprazole 40mg  daily.   Past Medical History:  Diagnosis Date  . GERD (gastroesophageal reflux disease)   . Hx of cataract surgery    both eyes  . Hyperglycemia   . Hyperlipidemia   . Hypertension   . Lumbago 04/05/2016  . Macular degeneration    ? which eye(s)  . Obstructive sleep apnea   . Osteoporosis, senile   . Overactive bladder   . Squamous cell skin cancer 2/16   left lower leg   Past Surgical History:  Procedure Laterality Date  . ABDOMINAL HYSTERECTOMY  1960's  . APPENDECTOMY  1960's  . MOLE REMOVAL  2000   facial  . TONSILLECTOMY      Allergies  Allergen Reactions  . Adhesive [Tape]   . Ampicillin   . Nitrofuran Derivatives   . Sulfur     Allergies as of 02/05/2017      Reactions   Adhesive [tape]    Ampicillin    Nitrofuran Derivatives    Sulfur       Medication List       Accurate as of 02/05/17  1:56 PM. Always use your most recent med list.          aspirin 325 MG tablet Take 325 mg by mouth daily.   CALCIUM 600+D3 600-800 MG-UNIT Tabs Generic drug:  Calcium Carb-Cholecalciferol Take by mouth. Take one daily   Fish Oil 1200 MG Caps Take by mouth. Take one daily   ICAPS Caps Take by mouth. Take one I-Cap daily for eyes   LORazepam 0.5 MG tablet Commonly known as:  ATIVAN Take 0.5 mg by mouth. Take one tablet daily as needed for anxiety   omeprazole 40 MG capsule Commonly known as:  PRILOSEC Take one capsule 1/2 hour before breakfast for stomach acid   oxybutynin 5 MG tablet Commonly known as:  DITROPAN Take one tablet at  bedtime for bladder   SYSTANE 0.4-0.3 % Soln Generic drug:  Polyethyl Glycol-Propyl Glycol Apply to eye. One drop both eyes three times daily   SYSTANE 0.4-0.3 % Gel ophthalmic gel Generic drug:  Polyethyl Glycol-Propyl Glycol Place 1 application into both eyes. Apply to eye lids at bedtime   traMADol 50 MG tablet Commonly known as:  ULTRAM TAKE 1 OR 2 TABLETS EVERY FOUR HOURS AS NEEDED.   valsartan-hydrochlorothiazide 160-12.5 MG tablet Commonly known as:  DIOVAN-HCT Take 1 tablet by mouth daily. For blood pressure       Review of Systems  Constitutional: Negative for activity change, appetite change, chills, diaphoresis, fatigue, fever and unexpected weight change.       Fatigue, nausea, weakness, sweating, anxiety  HENT: Negative for congestion, ear discharge, ear pain,  hearing loss, postnasal drip, rhinorrhea, sore throat, tinnitus, trouble swallowing and voice change.   Eyes: Negative for pain, redness, itching and visual disturbance.       Macular degeneration  Respiratory: Negative for cough, choking, shortness of breath and wheezing.        History of obstructive sleep apnea  Cardiovascular: Negative for chest pain, palpitations and leg swelling.  Gastrointestinal: Positive for constipation. Negative for abdominal distention, abdominal pain, diarrhea and nausea.       History GERD and reflux  Endocrine: Negative for cold intolerance, heat intolerance, polydipsia, polyphagia and polyuria.  Genitourinary: Positive for frequency. Negative for difficulty urinating, dysuria, flank pain, hematuria, pelvic pain, urgency and vaginal discharge.       Incontinent of urine. Has pessary.  Musculoskeletal: Positive for arthralgias and gait problem. Negative for back pain, myalgias, neck pain and neck stiffness.  Skin: Negative for color change, pallor and rash.  Allergic/Immunologic: Negative.   Neurological: Negative for dizziness, tremors, seizures, syncope, weakness, numbness and headaches.  Hematological: Negative for adenopathy. Does not bruise/bleed easily.  Psychiatric/Behavioral: Positive for sleep disturbance (insomnia). Negative for agitation, behavioral problems, confusion, dysphoric mood, hallucinations and suicidal ideas. The patient is nervous/anxious. The patient is not hyperactive.     Immunization History  Administered Date(s) Administered  . Influenza-Unspecified 09/27/2015, 10/09/2016  . Pneumococcal Conjugate-13 01/23/2015  . Pneumococcal Polysaccharide-23 10/20/2007  . Td 12/14/2012   Pertinent  Health Maintenance Due  Topic Date Due  . DEXA SCAN  01/23/1991  . INFLUENZA VACCINE  Completed  . PNA vac Low Risk Adult  Completed   Fall Risk  09/23/2016 05/29/2016 04/17/2016 03/27/2016 11/29/2015  Falls in the past year? No No No No No    Functional Status Survey:    Vitals:   02/05/17 1329  BP: 130/76  Pulse: 82  Resp: 18  Temp: 98.6 F (37 C)  SpO2: 97%  Weight: 179 lb 12.8 oz (81.6 kg)  Height: 5\' 5"  (1.651 m)   Body mass index is 29.92 kg/m. Physical Exam  Constitutional: She is oriented to person, place, and time. She appears well-developed and well-nourished. No distress.  HENT:  Right Ear: External ear normal.  Left Ear: External ear normal.  Nose: Nose normal.  Mouth/Throat: Oropharynx is clear and moist. No oropharyngeal exudate.  Eyes: Conjunctivae and EOM are normal. Pupils are equal, round, and reactive to light. No scleral icterus.  Neck: No JVD present. No tracheal deviation present. No thyromegaly present.  Cardiovascular: Normal rate, regular rhythm, normal heart sounds and intact distal pulses.  Exam reveals no gallop and no friction rub.   No murmur heard. Pulmonary/Chest: Effort normal. No respiratory distress. She has no wheezes. She  has no rales. She exhibits no tenderness.  Abdominal: She exhibits no distension and no mass. There is no tenderness.  Musculoskeletal: Normal range of motion. She exhibits no edema or tenderness.  Tender in lower back. Unstable gait  Lymphadenopathy:    She has no cervical adenopathy.  Neurological: She is alert and oriented to person, place, and time. No cranial nerve deficit. Coordination normal.  Skin: No rash noted. She is not diaphoretic. No erythema. No pallor.  Psychiatric: She has a normal mood and affect. Her behavior is normal. Judgment and thought content normal.    Labs reviewed:  Recent Labs  07/29/16 09/18/16 01/07/17 0001  NA 142 141 140  K 4.8 3.8 3.9  CL  --   --  104  CO2  --   --  25  GLUCOSE  --   --  99  BUN 22* 16 26*  CREATININE 1.0 0.7 0.81  CALCIUM  --   --  9.4    Recent Labs  05/20/16 07/29/16 01/07/17 0001  AST 24 19 23   ALT 19 12 15   ALKPHOS 60 59 71  BILITOT  --   --  0.4  PROT  --   --  6.1  ALBUMIN  --    --  3.7    Recent Labs  05/20/16 07/29/16 09/23/16 1206 01/07/17 0001  WBC 5.7 6.6 8.2 6.6  HGB 12.2 12.2 12.7 11.9  HCT 38 37 36.7* 36.6  MCV  --   --  89.8 91.5  PLT 194 172  --  229   Lab Results  Component Value Date   TSH 3.40 01/07/2017   Lab Results  Component Value Date   HGBA1C 5.7 03/18/2016   Lab Results  Component Value Date   CHOL 189 01/07/2017   HDL 57 01/07/2017   LDLCALC 120 (H) 01/07/2017   TRIG 58 01/07/2017   CHOLHDL 3.3 01/07/2017    Significant Diagnostic Results in last 30 days:  No results found.  Assessment/Plan Edema Right leg, mild, denied SOB, nocturnal paroxysmal dyspnea, palpitation, chest pressure or discomfort. Denied change of bowel habit, weight loss about # 10Ibs in the past year, , may consider abd CT to r/o intraabdominal process.                                                                                                                                                                    01/07/17 Hgb 11.9, Na 140, K 3.9, Bun 26, creat 0.81, TSH 3.4, BNP 25.6  Overactive bladder 1-2/night, continue Ditropan 5mg  qhs  HOH (hard of hearing) Audiology referral.        Family/ staff Communication: continue [place]  Audiology referral.   Labs/tests ordered: UA C/S, CT abd w/o CMP

## 2017-02-05 NOTE — Assessment & Plan Note (Signed)
Audiology referral.

## 2017-02-05 NOTE — Assessment & Plan Note (Signed)
1-2/night, continue Ditropan 5mg qhs 

## 2017-02-05 NOTE — Assessment & Plan Note (Signed)
Stable, diet controlled  

## 2017-02-05 NOTE — Assessment & Plan Note (Signed)
Sleeps well at night, occasionally use Lorazepam.  

## 2017-02-05 NOTE — Assessment & Plan Note (Signed)
Blood pressure is controlled, taking Valsartan HCT 160/12.5mg daily, taking Mega 3 and ASA 325mg for CV risk reduction.   

## 2017-02-11 ENCOUNTER — Telehealth: Payer: Self-pay | Admitting: *Deleted

## 2017-02-11 NOTE — Telephone Encounter (Signed)
Patient request a referral for an audiologist, I spoke with Dr. Nyoka Cowden as she requested he gave me [facility]. [phone]. I left a message on the patient's house phone with this information. There has been a referral placed in the patient's chart.

## 2017-02-20 DIAGNOSIS — G4733 Obstructive sleep apnea (adult) (pediatric): Secondary | ICD-10-CM | POA: Diagnosis not present

## 2017-02-20 DIAGNOSIS — R2681 Unsteadiness on feet: Secondary | ICD-10-CM | POA: Diagnosis not present

## 2017-02-20 DIAGNOSIS — M6281 Muscle weakness (generalized): Secondary | ICD-10-CM | POA: Diagnosis not present

## 2017-02-20 DIAGNOSIS — M545 Low back pain: Secondary | ICD-10-CM | POA: Diagnosis not present

## 2017-02-20 DIAGNOSIS — M5106 Intervertebral disc disorders with myelopathy, lumbar region: Secondary | ICD-10-CM | POA: Diagnosis not present

## 2017-02-23 ENCOUNTER — Ambulatory Visit
Admission: RE | Admit: 2017-02-23 | Discharge: 2017-02-23 | Disposition: A | Discharge status: Home / Self Care | Payer: PPO | Source: Ambulatory Visit | Attending: Nurse Practitioner | Admitting: Nurse Practitioner

## 2017-02-23 DIAGNOSIS — K5730 Diverticulosis of large intestine without perforation or abscess without bleeding: Secondary | ICD-10-CM | POA: Diagnosis not present

## 2017-02-23 DIAGNOSIS — R1030 Lower abdominal pain, unspecified: Secondary | ICD-10-CM

## 2017-02-26 DIAGNOSIS — M545 Low back pain: Secondary | ICD-10-CM | POA: Diagnosis not present

## 2017-02-26 DIAGNOSIS — R32 Unspecified urinary incontinence: Secondary | ICD-10-CM | POA: Diagnosis not present

## 2017-02-26 DIAGNOSIS — M6281 Muscle weakness (generalized): Secondary | ICD-10-CM | POA: Diagnosis not present

## 2017-02-26 DIAGNOSIS — H903 Sensorineural hearing loss, bilateral: Secondary | ICD-10-CM | POA: Diagnosis not present

## 2017-02-26 DIAGNOSIS — R29898 Other symptoms and signs involving the musculoskeletal system: Secondary | ICD-10-CM | POA: Diagnosis not present

## 2017-03-02 ENCOUNTER — Telehealth: Payer: Self-pay | Admitting: *Deleted

## 2017-03-02 ENCOUNTER — Other Ambulatory Visit: Payer: Self-pay | Admitting: Internal Medicine

## 2017-03-02 NOTE — Telephone Encounter (Signed)
Called patient to inform her that her CT results were normal, she stated that she understood and that hopefully Manxie will go more into depth with her regarding this CT. She also informed me that her insurance did not take the audiologist that Dr. Nyoka Cowden referred her to, and she guessed that she would have to start all over again.

## 2017-03-05 ENCOUNTER — Encounter: Payer: Self-pay | Admitting: Nurse Practitioner

## 2017-03-05 ENCOUNTER — Non-Acute Institutional Stay: Payer: PPO | Admitting: Nurse Practitioner

## 2017-03-05 DIAGNOSIS — R319 Hematuria, unspecified: Secondary | ICD-10-CM

## 2017-03-05 DIAGNOSIS — R609 Edema, unspecified: Secondary | ICD-10-CM

## 2017-03-05 DIAGNOSIS — N3281 Overactive bladder: Secondary | ICD-10-CM | POA: Diagnosis not present

## 2017-03-05 DIAGNOSIS — F5105 Insomnia due to other mental disorder: Secondary | ICD-10-CM

## 2017-03-05 DIAGNOSIS — I10 Essential (primary) hypertension: Secondary | ICD-10-CM

## 2017-03-05 DIAGNOSIS — F419 Anxiety disorder, unspecified: Secondary | ICD-10-CM

## 2017-03-05 DIAGNOSIS — N390 Urinary tract infection, site not specified: Secondary | ICD-10-CM | POA: Diagnosis not present

## 2017-03-05 DIAGNOSIS — M5440 Lumbago with sciatica, unspecified side: Secondary | ICD-10-CM | POA: Diagnosis not present

## 2017-03-05 DIAGNOSIS — K5900 Constipation, unspecified: Secondary | ICD-10-CM | POA: Diagnosis not present

## 2017-03-05 DIAGNOSIS — K219 Gastro-esophageal reflux disease without esophagitis: Secondary | ICD-10-CM | POA: Diagnosis not present

## 2017-03-05 MED ORDER — LORAZEPAM 0.5 MG PO TABS: 0.5000 mg | ORAL_TABLET | ORAL | 0 refills | Status: DC | PRN

## 2017-03-05 NOTE — Assessment & Plan Note (Signed)
1-2/night, continue Ditropan 5mg  qhs

## 2017-03-05 NOTE — Assessment & Plan Note (Signed)
Stable, continue Omeprazole 40mg  daily, prn Mylanta

## 2017-03-05 NOTE — Progress Notes (Signed)
Location:   [facility] (12)   Provider:  Anglia Blakley  NP   Patient Care Team: Estill Dooms, MD as PCP - General (Internal Medicine) Tyce Delcid Otho Darner, NP as Nurse Practitioner (Internal Medicine)  Extended Emergency Contact Information Primary Emergency Contact: [facility] Address: [address]          [place], [place] [address] Johnnette Litter of [place] Phone: [phone] Mobile Phone: [phone] Relation: Son  Code Status:   Goals of care: Advanced Directive information Advanced Directives 03/05/2017  Does Patient Have a Medical Advance Directive? Yes  Type of Paramedic of [place];Living will  Does patient want to make changes to medical advance directive? No - Patient declined  Copy of [place] in Chart? Yes     Chief Complaint  Patient presents with  . Acute Visit    pressure in her chest, check for UTI    HPI:  Pt is a 83 y.o. female seen today for C/o urinary frequency, denied dysuria, suprapubic or CVA tenderness, denied N/V/D, she is afebrile. Also c/o bile taste in her mouth x1, denied nausea, vomiting, abd pain associated with the event.   Hx of right leg and foot swelling. Denied pain, still able to ambulates with walker.   Hx of  anxiety, Lorazepam prn, improved,  back pain, s/p Ortho consult, s/p spinal inj. Pain is controlled with Tramadol prn  Hx of overactive bladder, taking Ditropan 5mg  daily. Blood pressure is controlled on Diovan-Hct 160/12.5mg , ASA 325mg  daily.  GERD stable on Omeprazole 40mg  daily.   Past Medical History:  Diagnosis Date  . GERD (gastroesophageal reflux disease)   . Hx of cataract surgery    both eyes  . Hyperglycemia   . Hyperlipidemia   . Hypertension   . Lumbago 04/05/2016  . Macular degeneration    ? which eye(s)  . Obstructive sleep apnea   . Osteoporosis, senile   . Overactive bladder   . Squamous cell skin cancer 2/16   left lower leg    Past Surgical History:  Procedure Laterality Date  . ABDOMINAL HYSTERECTOMY  1960's  . APPENDECTOMY  1960's  . MOLE REMOVAL  2000   facial  . TONSILLECTOMY      Allergies  Allergen Reactions  . Adhesive [Tape]   . Ampicillin   . Nitrofuran Derivatives   . Sulfur     Allergies as of 03/05/2017      Reactions   Adhesive [tape]    Ampicillin    Nitrofuran Derivatives    Sulfur       Medication List       Accurate as of 03/05/17  3:28 PM. Always use your most recent med list.          aspirin 325 MG tablet Take 325 mg by mouth daily.   CALCIUM 600+D3 600-800 MG-UNIT Tabs Generic drug:  Calcium Carb-Cholecalciferol Take by mouth. Take one daily   Fish Oil 1200 MG Caps Take by mouth. Take one daily   ICAPS Caps Take by mouth. Take one I-Cap daily for eyes   LORazepam 0.5 MG tablet Commonly known as:  ATIVAN Take 0.5 mg by mouth. Take one tablet daily as needed for anxiety   omeprazole 40 MG capsule Commonly known as:  PRILOSEC TAKE 1 CAPSULE DAILY 1/2 HOUR BEFORE BREAKFAST FOR STOMACH ACID.   oxybutynin 5 MG tablet Commonly known as:  DITROPAN Take one tablet at  bedtime for bladder   SYSTANE 0.4-0.3 % Soln Generic drug:  Polyethyl Glycol-Propyl Glycol Apply to eye. One drop both eyes three times daily   SYSTANE 0.4-0.3 % Gel ophthalmic gel Generic drug:  Polyethyl Glycol-Propyl Glycol Place 1 application into both eyes. Apply to eye lids at bedtime   traMADol 50 MG tablet Commonly known as:  ULTRAM TAKE 1 OR 2 TABLETS EVERY FOUR HOURS AS NEEDED.   valsartan-hydrochlorothiazide 160-12.5 MG tablet Commonly known as:  DIOVAN-HCT Take 1 tablet by mouth daily. For blood pressure       Review of Systems  Constitutional: Negative for activity change, appetite change, chills, diaphoresis, fatigue, fever and unexpected weight change.       Fatigue, nausea, weakness, sweating, anxiety  HENT: Negative for congestion, ear discharge, ear pain, hearing  loss, postnasal drip, rhinorrhea, sore throat, tinnitus, trouble swallowing and voice change.   Eyes: Negative for pain, redness, itching and visual disturbance.       Macular degeneration  Respiratory: Negative for cough, choking, shortness of breath and wheezing.        History of obstructive sleep apnea  Cardiovascular: Negative for chest pain, palpitations and leg swelling.  Gastrointestinal: Positive for constipation. Negative for abdominal distention, abdominal pain, diarrhea and nausea.       History GERD and reflux  Endocrine: Negative for cold intolerance, heat intolerance, polydipsia, polyphagia and polyuria.  Genitourinary: Positive for frequency. Negative for difficulty urinating, dysuria, flank pain, hematuria, pelvic pain, urgency and vaginal discharge.       Incontinent of urine. Has pessary.  Musculoskeletal: Positive for arthralgias and gait problem. Negative for back pain, myalgias, neck pain and neck stiffness.  Skin: Negative for color change, pallor and rash.  Allergic/Immunologic: Negative.   Neurological: Negative for dizziness, tremors, seizures, syncope, weakness, numbness and headaches.  Hematological: Negative for adenopathy. Does not bruise/bleed easily.  Psychiatric/Behavioral: Positive for sleep disturbance (insomnia). Negative for agitation, behavioral problems, confusion, dysphoric mood, hallucinations and suicidal ideas. The patient is nervous/anxious. The patient is not hyperactive.     Immunization History  Administered Date(s) Administered  . Influenza-Unspecified 09/27/2015, 10/09/2016  . Pneumococcal Conjugate-13 01/23/2015  . Pneumococcal Polysaccharide-23 10/20/2007  . Td 12/14/2012   Pertinent  Health Maintenance Due  Topic Date Due  . DEXA SCAN  01/23/1991  . INFLUENZA VACCINE  Completed  . PNA vac Low Risk Adult  Completed   Fall Risk  09/23/2016 05/29/2016 04/17/2016 03/27/2016 11/29/2015  Falls in the past year? No No No No No   Functional  Status Survey:    Vitals:   03/05/17 1501  BP: 122/70  Pulse: 95  Resp: (!) 22  Temp: 98.6 F (37 C)  SpO2: 93%  Weight: 179 lb 12.8 oz (81.6 kg)  Height: 5\' 5"  (1.651 m)   Body mass index is 29.92 kg/m. Physical Exam  Constitutional: She is oriented to person, place, and time. She appears well-developed and well-nourished. No distress.  HENT:  Right Ear: External ear normal.  Left Ear: External ear normal.  Nose: Nose normal.  Mouth/Throat: Oropharynx is clear and moist. No oropharyngeal exudate.  Eyes: Conjunctivae and EOM are normal. Pupils are equal, round, and reactive to light. No scleral icterus.  Neck: No JVD present. No tracheal deviation present. No thyromegaly present.  Cardiovascular: Normal rate, regular rhythm, normal heart sounds and intact distal pulses.  Exam reveals no gallop and no friction rub.   No murmur heard. Pulmonary/Chest: Effort normal. No respiratory distress. She has no wheezes.  She has no rales. She exhibits no tenderness.  Abdominal: She exhibits no distension and no mass. There is no tenderness.  Musculoskeletal: Normal range of motion. She exhibits no edema or tenderness.  Tender in lower back. Unstable gait  Lymphadenopathy:    She has no cervical adenopathy.  Neurological: She is alert and oriented to person, place, and time. No cranial nerve deficit. Coordination normal.  Skin: No rash noted. She is not diaphoretic. No erythema. No pallor.  Psychiatric: She has a normal mood and affect. Her behavior is normal. Judgment and thought content normal.    Labs reviewed:  Recent Labs  07/29/16 09/18/16 01/07/17 0001  NA 142 141 140  K 4.8 3.8 3.9  CL  --   --  104  CO2  --   --  25  GLUCOSE  --   --  99  BUN 22* 16 26*  CREATININE 1.0 0.7 0.81  CALCIUM  --   --  9.4    Recent Labs  05/20/16 07/29/16 01/07/17 0001  AST 24 19 23   ALT 19 12 15   ALKPHOS 60 59 71  BILITOT  --   --  0.4  PROT  --   --  6.1  ALBUMIN  --   --  3.7     Recent Labs  05/20/16 07/29/16 09/23/16 1206 01/07/17 0001  WBC 5.7 6.6 8.2 6.6  HGB 12.2 12.2 12.7 11.9  HCT 38 37 36.7* 36.6  MCV  --   --  89.8 91.5  PLT 194 172  --  229   Lab Results  Component Value Date   TSH 3.40 01/07/2017   Lab Results  Component Value Date   HGBA1C 5.7 03/18/2016   Lab Results  Component Value Date   CHOL 189 01/07/2017   HDL 57 01/07/2017   LDLCALC 120 (H) 01/07/2017   TRIG 58 01/07/2017   CHOLHDL 3.3 01/07/2017    Significant Diagnostic Results in last 30 days:  Ct Abdomen Pelvis Wo Contrast  Result Date: 02/23/2017 CLINICAL DATA:  Low back pain, pelvic pain. EXAM: CT ABDOMEN AND PELVIS WITHOUT CONTRAST TECHNIQUE: Multidetector CT imaging of the abdomen and pelvis was performed following the standard protocol without IV contrast. COMPARISON:  None. FINDINGS: Lower chest: Large hiatal hernia. Heart is normal size. Dense mitral valve calcifications. Lung bases clear. No effusions. Hepatobiliary: No focal hepatic abnormality. Gallbladder unremarkable. Pancreas: Mild pancreatic atrophy. No focal abnormality or pancreatic duct dilatation. Spleen: No focal abnormality.  Normal size. Adrenals/Urinary Tract: Bilateral renal cysts. No hydronephrosis. Adrenal glands and urinary bladder are unremarkable. Stomach/Bowel: Sigmoid diverticulosis. No active diverticulitis. Moderate stool throughout the colon. Stomach and small bowel decompressed. Vascular/Lymphatic: Aortic and iliac calcifications. No aneurysm or adenopathy. Reproductive: Prior hysterectomy. No adnexal masses. Pessary noted in place. Other: No free fluid or free air. Musculoskeletal: Degenerative changes in the lumbar spine. No acute bony abnormality. IMPRESSION: No acute findings. Sigmoid diverticulosis.  No active diverticulitis. Large hiatal hernia. Aortoiliac atherosclerosis. Electronically Signed   By: Rolm Baptise M.D.   On: 02/23/2017 13:19    Assessment/Plan UTI (urinary tract  infection) Update UA C/S CBC CMP, obtain US liver, pancrease, gallbladder Amylase, Lipase in setting of bile taste in mouth. She  Is afebrile, denied nausea, vomiting, diarrhea, or abd pain.   HTN (hypertension) Blood pressure is controlled, taking Valsartan HCT 160/12.5mg  daily, taking Mega 3 and ASA 325mg  for CV risk reduction.    GERD (gastroesophageal reflux disease) Stable, continue Omeprazole 40mg  daily, prn  Mylanta  Constipation Stable, diet controlled.   Overactive bladder 1-2/night, continue Ditropan 5mg  qhs  Lumbago continue Tramadol for pain   Insomnia secondary to anxiety Sleeps well at night, occasionally use Lorazepam.   Edema 01/07/17 Hgb 11.9, Na 140, K 3.9, Bun 26, creat 0.81, TSH 3.4, BNP 25.6  02/23/17 CT abd pelvis w/o CM IMPRESSION: No acute findings. Sigmoid diverticulosis.  No active diverticulitis. Large hiatal hernia. Aortoiliac atherosclerosis. Mild R leg.        Family/ staff Communication: continue IL   Labs/tests ordered: UA C/S, CBC, CMP, Amylase, Lipase, US liver, gallbladder, pancrease.

## 2017-03-05 NOTE — Assessment & Plan Note (Signed)
01/07/17 Hgb 11.9, Na 140, K 3.9, Bun 26, creat 0.81, TSH 3.4, BNP 25.6  02/23/17 CT abd pelvis w/o CM IMPRESSION: No acute findings. Sigmoid diverticulosis.  No active diverticulitis. Large hiatal hernia. Aortoiliac atherosclerosis. Mild R leg.

## 2017-03-05 NOTE — Assessment & Plan Note (Signed)
continue Tramadol for pain

## 2017-03-05 NOTE — Assessment & Plan Note (Signed)
Stable, diet controlled  

## 2017-03-05 NOTE — Assessment & Plan Note (Signed)
Sleeps well at night, occasionally use Lorazepam.

## 2017-03-05 NOTE — Assessment & Plan Note (Signed)
Blood pressure is controlled, taking Valsartan HCT 160/12.5mg  daily, taking Mega 3 and ASA 325mg  for CV risk reduction.

## 2017-03-05 NOTE — Assessment & Plan Note (Signed)
Update UA C/S CBC CMP, obtain US liver, pancrease, gallbladder Amylase, Lipase in setting of bile taste in mouth. She  Is afebrile, denied nausea, vomiting, diarrhea, or abd pain.

## 2017-03-09 ENCOUNTER — Other Ambulatory Visit: Payer: Self-pay | Admitting: Nurse Practitioner

## 2017-03-09 ENCOUNTER — Other Ambulatory Visit: Payer: Self-pay

## 2017-03-09 DIAGNOSIS — R319 Hematuria, unspecified: Secondary | ICD-10-CM

## 2017-03-09 DIAGNOSIS — R1030 Lower abdominal pain, unspecified: Secondary | ICD-10-CM

## 2017-03-09 DIAGNOSIS — N3281 Overactive bladder: Secondary | ICD-10-CM

## 2017-03-09 DIAGNOSIS — R609 Edema, unspecified: Secondary | ICD-10-CM | POA: Diagnosis not present

## 2017-03-09 DIAGNOSIS — R109 Unspecified abdominal pain: Secondary | ICD-10-CM | POA: Diagnosis not present

## 2017-03-09 DIAGNOSIS — N390 Urinary tract infection, site not specified: Secondary | ICD-10-CM

## 2017-03-10 DIAGNOSIS — N390 Urinary tract infection, site not specified: Secondary | ICD-10-CM | POA: Diagnosis not present

## 2017-03-10 DIAGNOSIS — N3281 Overactive bladder: Secondary | ICD-10-CM | POA: Diagnosis not present

## 2017-03-10 DIAGNOSIS — R319 Hematuria, unspecified: Secondary | ICD-10-CM | POA: Diagnosis not present

## 2017-03-10 LAB — COMPREHENSIVE METABOLIC PANEL
ALT: 19 U/L (ref 6–29)
AST: 26 U/L (ref 10–35)
Albumin: 4.1 g/dL (ref 3.6–5.1)
Alkaline Phosphatase: 72 U/L (ref 33–130)
BILIRUBIN TOTAL: 0.4 mg/dL (ref 0.2–1.2)
BUN: 26 mg/dL — AB (ref 7–25)
CHLORIDE: 104 mmol/L (ref 98–110)
CO2: 26 mmol/L (ref 20–31)
CREATININE: 0.82 mg/dL (ref 0.60–0.88)
Calcium: 10 mg/dL (ref 8.6–10.4)
GLUCOSE: 105 mg/dL — AB (ref 65–99)
Potassium: 3.8 mmol/L (ref 3.5–5.3)
SODIUM: 140 mmol/L (ref 135–146)
Total Protein: 6.1 g/dL (ref 6.1–8.1)

## 2017-03-10 LAB — URINALYSIS
BILIRUBIN URINE: NEGATIVE
Glucose, UA: NEGATIVE
Hgb urine dipstick: NEGATIVE
KETONES UR: NEGATIVE
Leukocytes, UA: NEGATIVE
Nitrite: NEGATIVE
PROTEIN: NEGATIVE
Specific Gravity, Urine: 1.025 (ref 1.001–1.035)
pH: 5.5 (ref 5.0–8.0)

## 2017-03-10 LAB — BILIRUBIN, DIRECT: Bilirubin, Direct: 0.1 mg/dL (ref <=0.2)

## 2017-03-10 LAB — AMYLASE: AMYLASE: 41 U/L (ref 0–105)

## 2017-03-11 ENCOUNTER — Telehealth: Payer: Self-pay | Admitting: *Deleted

## 2017-03-11 DIAGNOSIS — N390 Urinary tract infection, site not specified: Secondary | ICD-10-CM

## 2017-03-11 LAB — URINE CULTURE

## 2017-03-11 MED ORDER — ERYTHROMYCIN BASE 500 MG PO TABS: 500.0000 mg | ORAL_TABLET | Freq: Four times a day (QID) | ORAL | 0 refills | Status: AC

## 2017-03-11 MED ORDER — SACCHAROMYCES BOULARDII 250 MG PO CAPS: 250.0000 mg | ORAL_CAPSULE | Freq: Two times a day (BID) | ORAL | 1 refills | Status: DC

## 2017-03-11 NOTE — Telephone Encounter (Signed)
Called patient regarding labs, informed her that she had a UTI and medication has been sent to the pharmacy. I explained to her that a probiotic was ordered and she should take the medication with food. She stated that she understood and would start the medication as soon as she receives it.

## 2017-03-13 LAB — LIPASE: LIPASE: 7 U/L (ref 7–60)

## 2017-03-24 ENCOUNTER — Other Ambulatory Visit: Payer: PPO

## 2017-03-26 ENCOUNTER — Encounter: Payer: Self-pay | Admitting: Nurse Practitioner

## 2017-03-26 ENCOUNTER — Ambulatory Visit (INDEPENDENT_AMBULATORY_CARE_PROVIDER_SITE_OTHER): Payer: PPO | Admitting: Nurse Practitioner

## 2017-03-26 ENCOUNTER — Ambulatory Visit
Admission: RE | Admit: 2017-03-26 | Discharge: 2017-03-26 | Disposition: A | Discharge status: Home / Self Care | Payer: PPO | Source: Ambulatory Visit | Attending: Nurse Practitioner | Admitting: Nurse Practitioner

## 2017-03-26 VITALS — BP 142/84 | HR 74 | Temp 98.2°F | Resp 17 | Ht 65.0 in | Wt 178.8 lb

## 2017-03-26 DIAGNOSIS — M5440 Lumbago with sciatica, unspecified side: Secondary | ICD-10-CM | POA: Diagnosis not present

## 2017-03-26 DIAGNOSIS — R432 Parageusia: Secondary | ICD-10-CM | POA: Diagnosis not present

## 2017-03-26 DIAGNOSIS — F5105 Insomnia due to other mental disorder: Secondary | ICD-10-CM | POA: Diagnosis not present

## 2017-03-26 DIAGNOSIS — F419 Anxiety disorder, unspecified: Secondary | ICD-10-CM

## 2017-03-26 DIAGNOSIS — N3281 Overactive bladder: Secondary | ICD-10-CM

## 2017-03-26 DIAGNOSIS — N390 Urinary tract infection, site not specified: Secondary | ICD-10-CM

## 2017-03-26 DIAGNOSIS — R319 Hematuria, unspecified: Principal | ICD-10-CM

## 2017-03-26 MED ORDER — LORAZEPAM 0.5 MG PO TABS: 0.2500 mg | ORAL_TABLET | Freq: Every day | ORAL | 0 refills | Status: DC | PRN

## 2017-03-26 MED ORDER — SERTRALINE HCL 25 MG PO TABS: 25.0000 mg | ORAL_TABLET | Freq: Every day | ORAL | 1 refills | Status: DC

## 2017-03-26 NOTE — Patient Instructions (Addendum)
CUT ativan tablet in 1/2 and take 1/2 tablet daily START ZOLOFT 25 mg by mouth daily for anxiety   Can use tylenol 325 mg tablets 2 tablets every 6 hours as needed for pain.

## 2017-03-26 NOTE — Progress Notes (Signed)
Careteam: Patient Care Team: Estill Dooms, MD as PCP - General (Internal Medicine) Man Otho Darner, NP as Nurse Practitioner (Internal Medicine)  Advanced Directive information Does Patient Have a Medical Advance Directive?: Yes, Type of Advance Directive: [place];Living will  Allergies  Allergen Reactions  . Adhesive [Tape]   . Ampicillin   . Nitrofuran Derivatives   . Sulfur     Chief Complaint  Patient presents with  . Acute Visit    Pt states that she became very anxious before driving yesterday. Pt states that this type of anxiety has happened before.      HPI: Patient is a 83 y.o. female seen in the office today due to having a nervous spell. Does not feel like she should drive her car until she figures out what is going on. Wonders if she just built it up in her head until she had an anxiety attack. Feels like it is unnecessary to expend energy this way. 2nd time this has happened since she has been here- moved in nov 2016.  [facility]. Still takes care of her expenses, runs errands, balances check book, etc.  Near one of her sons now. Her hopes would be that he would check in maybe once a week and see her once a month but this has not been a reality.  Feels like she is anxious because they never call or visit.  Told them once about this and how she has felt and things got better but now its back to not hearing from them. Does not have many friends. Does not wish to be a burden on her children. She calls them but they do not call her.  [facility] in [facility] to this.  Taking her ativan every day (thought she was supposed to take it daily)   Reports dry mouth and constipation.  Still bothering by frequent urination.   Review of Systems:  Review of Systems  Constitutional: Negative for activity change, appetite change, chills, fatigue, fever and unexpected weight change.  HENT:       Dry mouth   Eyes: Negative.   Respiratory: Negative for cough and shortness of breath.   Cardiovascular: Negative for chest pain, palpitations and leg swelling.  Gastrointestinal: Negative for abdominal pain, constipation and diarrhea.  Genitourinary: Positive for frequency. Negative for difficulty urinating, dysuria and urgency. Genital sores: OAB.  Musculoskeletal: Positive for back pain. Negative for arthralgias and myalgias.  Skin: Negative.  Negative for color change and wound.  Neurological: Negative for dizziness, weakness and headaches.  Psychiatric/Behavioral: Positive for dysphoric mood. Negative for agitation, behavioral problems and confusion. The patient is nervous/anxious.     Past Medical History:  Diagnosis Date  . GERD (gastroesophageal reflux disease)   . Hx of cataract surgery    both eyes  . Hyperglycemia   . Hyperlipidemia   . Hypertension   . Lumbago 04/05/2016  . Macular degeneration    ? which eye(s)  . Obstructive sleep apnea   . Osteoporosis, senile   . Overactive bladder   . Squamous cell skin cancer 2/16   left lower leg   Past Surgical History:  Procedure Laterality Date  . ABDOMINAL HYSTERECTOMY  1960's  . APPENDECTOMY  1960's  . MOLE REMOVAL  2000   facial  . TONSILLECTOMY     Social History:   reports that she has never smoked. She has never used smokeless tobacco. She  reports that she drinks alcohol. She reports that she does not use drugs.  Family History  Problem Relation Age of Onset  . Heart disease Mother   . Heart disease Father     Medications: Patient's Medications  New Prescriptions   No medications on file  Previous Medications   ASPIRIN 325 MG TABLET    Take 325 mg by mouth daily.   CALCIUM CARB-CHOLECALCIFEROL (CALCIUM 600+D3) 600-800 MG-UNIT TABS    Take by mouth. Take one daily   LORAZEPAM (ATIVAN) 0.5 MG TABLET    Take 1 tablet (0.5 mg total) by mouth as needed for anxiety. Take one tablet daily as needed for anxiety   MULTIPLE  VITAMINS-MINERALS (ICAPS) CAPS    Take by mouth. Take one I-Cap daily for eyes   OMEGA-3 FATTY ACIDS (FISH OIL) 1200 MG CAPS    Take by mouth. Take one daily   OMEPRAZOLE (PRILOSEC) 40 MG CAPSULE    TAKE 1 CAPSULE DAILY 1/2 HOUR BEFORE BREAKFAST FOR STOMACH ACID.   OXYBUTYNIN (DITROPAN) 5 MG TABLET    Take one tablet at bedtime for bladder   POLYETHYL GLYCOL-PROPYL GLYCOL (SYSTANE) 0.4-0.3 % GEL OPHTHALMIC GEL    Place 1 application into both eyes. Apply to eye lids at bedtime   POLYETHYL GLYCOL-PROPYL GLYCOL (SYSTANE) 0.4-0.3 % SOLN    Apply to eye. One drop both eyes three times daily   TRAMADOL (ULTRAM) 50 MG TABLET    TAKE 1 OR 2 TABLETS EVERY FOUR HOURS AS NEEDED.   VALSARTAN-HYDROCHLOROTHIAZIDE (DIOVAN-HCT) 160-12.5 MG TABLET    Take 1 tablet by mouth daily. For blood pressure  Modified Medications   No medications on file  Discontinued Medications   SACCHAROMYCES BOULARDII (FLORASTOR) 250 MG CAPSULE    Take 1 capsule (250 mg total) by mouth 2 (two) times daily.     Physical Exam:  Vitals:   03/26/17 0931  BP: (!) 142/84  Pulse: 74  Resp: 17  Temp: 98.2 F (36.8 C)  TempSrc: Oral  SpO2: 98%  Weight: 178 lb 12.8 oz (81.1 kg)  Height: 5\' 5"  (1.651 m)   Body mass index is 29.75 kg/m.  Physical Exam  Constitutional: She is oriented to person, place, and time. She appears well-developed and well-nourished. No distress.  HENT:  Right Ear: External ear normal.  Left Ear: External ear normal.  Cardiovascular: Normal rate, regular rhythm and normal heart sounds.   Pulmonary/Chest: Effort normal and breath sounds normal.  Abdominal: Soft. Bowel sounds are normal.  Musculoskeletal: She exhibits no edema.  Tender in lower back. Unstable gait  Neurological: She is alert and oriented to person, place, and time.  Skin: Skin is warm and dry. She is not diaphoretic.  Psychiatric: She has a normal mood and affect. Her behavior is normal. Judgment and thought content normal.     Labs reviewed: Basic Metabolic Panel:  Recent Labs  05/20/16 07/29/16 09/18/16 01/07/17 0001 03/09/17 0001  NA 139 142 141 140 140  K 4.2 4.8 3.8 3.9 3.8  CL  --   --   --  104 104  CO2  --   --   --  25 26  GLUCOSE  --   --   --  99 105*  BUN 21 22* 16 26* 26*  CREATININE 0.8 1.0 0.7 0.81 0.82  CALCIUM  --   --   --  9.4 10.0  TSH 4.30 3.08  --  3.40  --    Liver Function Tests:  Recent Labs  07/29/16 01/07/17 0001 03/09/17 0001  AST 19 23 26   ALT 12 15 19   ALKPHOS 59 71 72  BILITOT  --  0.4 0.4  PROT  --  6.1 6.1  ALBUMIN  --  3.7 4.1    Recent Labs  03/09/17 0001  LIPASE 7  AMYLASE 41   No results for input(s): AMMONIA in the last 8760 hours. CBC:  Recent Labs  05/20/16 07/29/16 09/23/16 1206 01/07/17 0001  WBC 5.7 6.6 8.2 6.6  HGB 12.2 12.2 12.7 11.9  HCT 38 37 36.7* 36.6  MCV  --   --  89.8 91.5  PLT 194 172  --  229   Lipid Panel:  Recent Labs  01/07/17 0001  CHOL 189  HDL 57  LDLCALC 120*  TRIG 58  CHOLHDL 3.3   TSH:  Recent Labs  05/20/16 07/29/16 01/07/17 0001  TSH 4.30 3.08 3.40   A1C: Lab Results  Component Value Date   HGBA1C 5.7 03/18/2016     Assessment/Plan 1. Anxiety -pt has been bothering by increase in anxiety, [facility]. Trying to make new friends, be involved in activities. Very bothered by family not visiting and calling.  -taking her ativan 0.5 mg daily in the morning not as needed and has been doing this for years. Will decrease to 1/2 tablet at this time in attempts to wean medication.  Will also start zoloft 25 mg daily for maintenance   - sertraline (ZOLOFT) 25 MG tablet; Take 1 tablet (25 mg total) by mouth daily.  Dispense: 30 tablet; Refill: 1  2. Overactive bladder Pt has been on oxybutynin for OAB but still is bothered by these symptoms. Has side effects noted with medication. Will consider stopping oxybutynin and start myrbetiq at follow up.   3. Bilateral low back  pain with sciatica, sciatica laterality unspecified, unspecified chronicity Worse after acupuncture, trying not to take tramadol but still needs something occasionally.  May use heat as needed or tylenol 325 mg 2 tablets every 6 hours as needed.   Follow up in 4 weeks to follow up anxiety/mood, OAB Blessed Girdner K. Harle Battiest  [facility] [phone] 8 am - 5 pm) [phone] (after hours)

## 2017-03-27 DIAGNOSIS — G4733 Obstructive sleep apnea (adult) (pediatric): Secondary | ICD-10-CM | POA: Diagnosis not present

## 2017-04-01 ENCOUNTER — Telehealth: Payer: Self-pay | Admitting: *Deleted

## 2017-04-01 MED ORDER — LORATADINE 10 MG PO TABS: 10.0000 mg | ORAL_TABLET | Freq: Every day | ORAL | 11 refills | Status: DC

## 2017-04-01 NOTE — Telephone Encounter (Signed)
May use OTC loratadine 10 mg daily

## 2017-04-01 NOTE — Telephone Encounter (Signed)
LM on patient's machine with Jessica's response. Medication list updated.

## 2017-04-01 NOTE — Telephone Encounter (Signed)
Patient called and stated that she was seen last week and forgot to ask for a medication for her allergies. Has a runny nose all the time now from Pollen. Patient would like an allergy medication called to [facility]. Please Advise.

## 2017-04-02 ENCOUNTER — Telehealth: Payer: Self-pay

## 2017-04-02 NOTE — Telephone Encounter (Signed)
Okay to stop medication but would prefer her to cont the 1/2 tablet of lorazepam There are other medications we can try if zoloft had too many side effects

## 2017-04-02 NOTE — Telephone Encounter (Signed)
Patient would like to d/c zoloft, patient started medication Saturday and presents with dizziness, faint feeling, nausea and nightmares. Patient did not take medication today, patient had to appointments and did not wish to have these symptoms while out and about.   Patient is asking to take a whole tablet of the lorazepam as needed instead.  Please advise

## 2017-04-02 NOTE — Addendum Note (Signed)
Addended by: Logan Bores on: 04/02/2017 03:54 PM   Modules accepted: Orders

## 2017-04-02 NOTE — Telephone Encounter (Signed)
Dicussed with patient, patient verbalized understanding. Patient will take lorazepam as directed and call if she feels the need for something additional

## 2017-04-22 ENCOUNTER — Telehealth: Payer: Self-pay | Admitting: *Deleted

## 2017-04-22 DIAGNOSIS — N8110 Cystocele, unspecified: Secondary | ICD-10-CM | POA: Diagnosis not present

## 2017-04-22 DIAGNOSIS — F5105 Insomnia due to other mental disorder: Principal | ICD-10-CM

## 2017-04-22 DIAGNOSIS — F419 Anxiety disorder, unspecified: Secondary | ICD-10-CM

## 2017-04-22 MED ORDER — LORAZEPAM 0.5 MG PO TABS: ORAL_TABLET | ORAL | 0 refills | Status: DC

## 2017-04-22 NOTE — Telephone Encounter (Signed)
Medication list updated. Patient notified. Rx faxed to pharmacy.

## 2017-04-22 NOTE — Telephone Encounter (Signed)
Change prescription to read: Take one tablet daily if needed for anxiety. Dispense 30 tablets of lorazepam 1 mg.

## 2017-04-22 NOTE — Telephone Encounter (Signed)
Patient called and stated that she needed a Rx faxed to pharmacy for her Lorazepam 0.5 One tablet daily.   Jessica's note dated 04/02/2017 states that patient is only suppose to be taking 1/2 tablet daily but patient is taking a whole tablet and about to run out of her medication and wants enough called in to her pharmacy to get her through to her appointment on 05/04/17 with Janett Billow. Please Advise.

## 2017-04-23 ENCOUNTER — Telehealth: Payer: Self-pay

## 2017-04-23 NOTE — Telephone Encounter (Signed)
Patient called requesting results of CT completed on 02/23/17 and U/S on 03/26/17  Please advise

## 2017-04-24 NOTE — Telephone Encounter (Signed)
-----   Message from Man Otho Darner, NP sent at 04/23/2017  5:14 PM EDT ----- Let the patient know her CT abd 02/23/17 and [place] RUQ 03/26/17 showed no acute changes. Thank you

## 2017-04-24 NOTE — Telephone Encounter (Signed)
Spoke with patient regarding her CT and [place] , I informed her that there was no acute changes. She stated that the tech that did the test informed her that she had a large hernia. She wants to know what should be done about this hernia (what will keep it from becoming bigger). She is still having episodes of burping up a bile tasting sputum. I reminded her that she has omeprazole that she should be taking every morning at breakfast and Mylanta that she can take as needed per [facility] NP.Marland Kitchen She stated that she did not have any Mylanta in her home, but will get some to see if that will help.

## 2017-04-24 NOTE — Telephone Encounter (Signed)
Spoke with patient regarding her question about the hernia seen on the [place]. I informed her that [facility] stated the  things that would help it would be: lift her head when sleeping, avoid acidic foods(orange juice, tomato based), no eating or drinking before bed, reduce wt.,and avoid constipation. She stated that she understood and would work on these things.

## 2017-04-30 DIAGNOSIS — D0472 Carcinoma in situ of skin of left lower limb, including hip: Secondary | ICD-10-CM | POA: Diagnosis not present

## 2017-04-30 DIAGNOSIS — L814 Other melanin hyperpigmentation: Secondary | ICD-10-CM | POA: Diagnosis not present

## 2017-04-30 DIAGNOSIS — D485 Neoplasm of uncertain behavior of skin: Secondary | ICD-10-CM | POA: Diagnosis not present

## 2017-04-30 DIAGNOSIS — L821 Other seborrheic keratosis: Secondary | ICD-10-CM | POA: Diagnosis not present

## 2017-04-30 DIAGNOSIS — D225 Melanocytic nevi of trunk: Secondary | ICD-10-CM | POA: Diagnosis not present

## 2017-04-30 DIAGNOSIS — D2272 Melanocytic nevi of left lower limb, including hip: Secondary | ICD-10-CM | POA: Diagnosis not present

## 2017-05-04 ENCOUNTER — Encounter: Payer: Self-pay | Admitting: Nurse Practitioner

## 2017-05-04 ENCOUNTER — Ambulatory Visit (INDEPENDENT_AMBULATORY_CARE_PROVIDER_SITE_OTHER): Payer: PPO | Admitting: Nurse Practitioner

## 2017-05-04 VITALS — BP 136/78 | HR 94 | Temp 98.2°F | Resp 18 | Ht 65.0 in | Wt 183.6 lb

## 2017-05-04 DIAGNOSIS — N3281 Overactive bladder: Secondary | ICD-10-CM | POA: Diagnosis not present

## 2017-05-04 DIAGNOSIS — M5440 Lumbago with sciatica, unspecified side: Secondary | ICD-10-CM

## 2017-05-04 DIAGNOSIS — F419 Anxiety disorder, unspecified: Secondary | ICD-10-CM | POA: Diagnosis not present

## 2017-05-04 MED ORDER — DULOXETINE HCL 20 MG PO CPEP: 20.0000 mg | ORAL_CAPSULE | Freq: Every day | ORAL | 0 refills | Status: DC

## 2017-05-04 MED ORDER — LORATADINE 10 MG PO TABS: 10.0000 mg | ORAL_TABLET | Freq: Every day | ORAL | 3 refills | Status: DC

## 2017-05-04 NOTE — Patient Instructions (Signed)
Will start cymbalta 20 mg daily for mood  Follow up in 4 weeks on anxiety

## 2017-05-04 NOTE — Progress Notes (Signed)
Careteam: Patient Care Team: Estill Dooms, MD as PCP - General (Internal Medicine) Mast, Man X, NP as Nurse Practitioner (Internal Medicine)  Advanced Directive information Does Patient Have a Medical Advance Directive?: Yes, Type of Advance Directive: [place];Living will  Allergies  Allergen Reactions  . Adhesive [Tape]   . Ampicillin   . Nitrofuran Derivatives   . Sulfur   . Zoloft [Sertraline Hcl] Other (See Comments)    Dizziness, nausea, faint feeling, and nightmares      Chief Complaint  Patient presents with  . Medical Management of Chronic Issues    Pt is being seen for a 4 week follow up on mood, pain, and overactive bladder.      HPI: Patient is a 83 y.o. female seen in the office today due to anxiety follow up.  Pt was started on zoloft but due to sides effects were unable to tolerate medication. Sides effects included dizziness, nausea, nightmares.  Pt also taking lorazepam 1 tablet daily, was unable to decrease to 1/2 tablet.  conts to have occasional anxiety attacks around once weekly. Uses relaxation techniques which helps her get through it.   Pt with overactive bladder- last visit she reported she felt like symptoms were not controlled on oxybutynin however today she states medication controls this very well. Does not get up to the bathroom at night.   No constipation at this time.    conts to have back pain- taking tylenol and exercises with vibration.  Review of Systems:  Review of Systems  Constitutional: Negative for activity change, appetite change, chills, fatigue, fever and unexpected weight change.  HENT:       Dry mouth  Respiratory: Negative for cough and shortness of breath.   Cardiovascular: Negative for chest pain, palpitations and leg swelling.  Gastrointestinal: Negative for abdominal pain, constipation and diarrhea.  Genitourinary: Negative for difficulty urinating, dysuria, frequency and urgency.    Musculoskeletal: Negative for arthralgias and myalgias.  Skin: Negative.  Negative for color change and wound.  Neurological: Negative for dizziness, weakness and headaches.  Psychiatric/Behavioral: Positive for dysphoric mood. Negative for agitation, behavioral problems and confusion. The patient is nervous/anxious.     Past Medical History:  Diagnosis Date  . GERD (gastroesophageal reflux disease)   . Hx of cataract surgery    both eyes  . Hyperglycemia   . Hyperlipidemia   . Hypertension   . Lumbago 04/05/2016  . Macular degeneration    ? which eye(s)  . Obstructive sleep apnea   . Osteoporosis, senile   . Overactive bladder   . Squamous cell skin cancer 2/16   left lower leg   Past Surgical History:  Procedure Laterality Date  . ABDOMINAL HYSTERECTOMY  1960's  . APPENDECTOMY  1960's  . MOLE REMOVAL  2000   facial  . TONSILLECTOMY     Social History:   reports that she has never smoked. She has never used smokeless tobacco. She reports that she drinks alcohol. She reports that she does not use drugs.  Family History  Problem Relation Age of Onset  . Heart disease Mother   . Heart disease Father     Medications: Patient's Medications  New Prescriptions   No medications on file  Previous Medications   ACETAMINOPHEN (TYLENOL) 500 MG TABLET    Take 1,000 mg by mouth every 6 (six) hours as needed for mild pain or moderate pain.   ASPIRIN 325 MG TABLET    Take 325  mg by mouth daily.   CALCIUM CARB-CHOLECALCIFEROL (CALCIUM 600+D3) 600-800 MG-UNIT TABS    Take by mouth. Take one daily   LORATADINE (CLARITIN) 10 MG TABLET    Take 1 tablet (10 mg total) by mouth daily.   LORAZEPAM (ATIVAN) 0.5 MG TABLET    Take one tablet daily as needed for anxiety   MULTIPLE VITAMINS-MINERALS (ICAPS) CAPS    Take by mouth. Take one I-Cap daily for eyes   OMEGA-3 FATTY ACIDS (FISH OIL) 1200 MG CAPS    Take by mouth. Take one daily   OMEPRAZOLE (PRILOSEC) 40 MG CAPSULE    TAKE 1 CAPSULE  DAILY 1/2 HOUR BEFORE BREAKFAST FOR STOMACH ACID.   OXYBUTYNIN (DITROPAN) 5 MG TABLET    Take one tablet at bedtime for bladder   POLYETHYL GLYCOL-PROPYL GLYCOL (SYSTANE) 0.4-0.3 % GEL OPHTHALMIC GEL    Place 1 application into both eyes. Apply to eye lids at bedtime   POLYETHYL GLYCOL-PROPYL GLYCOL (SYSTANE) 0.4-0.3 % SOLN    Apply to eye. One drop both eyes three times daily   TRAMADOL (ULTRAM) 50 MG TABLET    TAKE 1 OR 2 TABLETS EVERY FOUR HOURS AS NEEDED.   VALSARTAN-HYDROCHLOROTHIAZIDE (DIOVAN-HCT) 160-12.5 MG TABLET    Take 1 tablet by mouth daily. For blood pressure  Modified Medications   No medications on file  Discontinued Medications   No medications on file     Physical Exam:  Vitals:   05/04/17 1431  BP: 136/78  Pulse: 94  Resp: 18  Temp: 98.2 F (36.8 C)  TempSrc: Oral  SpO2: 96%  Weight: 183 lb 9.6 oz (83.3 kg)  Height: 5\' 5"  (1.651 m)   Body mass index is 30.55 kg/m.  Physical Exam  Constitutional: She is oriented to person, place, and time. She appears well-developed and well-nourished. No distress.  HENT:  Right Ear: External ear normal.  Left Ear: External ear normal.  Cardiovascular: Normal rate, regular rhythm and normal heart sounds.   Pulmonary/Chest: Effort normal and breath sounds normal.  Abdominal: Soft. Bowel sounds are normal.  Musculoskeletal: She exhibits no edema.  Neurological: She is alert and oriented to person, place, and time.  Skin: Skin is warm and dry. She is not diaphoretic.  Psychiatric: She has a normal mood and affect. Her behavior is normal. Judgment and thought content normal.    Labs reviewed: Basic Metabolic Panel:  Recent Labs  05/20/16 07/29/16 09/18/16 01/07/17 0001 03/09/17 0001  NA 139 142 141 140 140  K 4.2 4.8 3.8 3.9 3.8  CL  --   --   --  104 104  CO2  --   --   --  25 26  GLUCOSE  --   --   --  99 105*  BUN 21 22* 16 26* 26*  CREATININE 0.8 1.0 0.7 0.81 0.82  CALCIUM  --   --   --  9.4 10.0  TSH 4.30  3.08  --  3.40  --    Liver Function Tests:  Recent Labs  07/29/16 01/07/17 0001 03/09/17 0001  AST 19 23 26   ALT 12 15 19   ALKPHOS 59 71 72  BILITOT  --  0.4 0.4  PROT  --  6.1 6.1  ALBUMIN  --  3.7 4.1    Recent Labs  03/09/17 0001  LIPASE 7  AMYLASE 41   No results for input(s): AMMONIA in the last 8760 hours. CBC:  Recent Labs  05/20/16 07/29/16 09/23/16 1206 01/07/17 0001  WBC 5.7 6.6 8.2 6.6  HGB 12.2 12.2 12.7 11.9  HCT 38 37 36.7* 36.6  MCV  --   --  89.8 91.5  PLT 194 172  --  229   Lipid Panel:  Recent Labs  01/07/17 0001  CHOL 189  HDL 57  LDLCALC 120*  TRIG 58  CHOLHDL 3.3   TSH:  Recent Labs  05/20/16 07/29/16 01/07/17 0001  TSH 4.30 3.08 3.40   A1C: Lab Results  Component Value Date   HGBA1C 5.7 03/18/2016     Assessment/Plan .1. Anxiety Did not tolerate zoloft, still having anxiety with panic attacks occasionally - DULoxetine (CYMBALTA) 20 MG capsule; Take 1 capsule (20 mg total) by mouth daily.  Dispense: 30 capsule; Refill: 0  2. Overactive bladder -conts on oxybutynin, feels like OAB is well controlled. Will cont at this time.   3. Bilateral low back pain with sciatica, sciatica laterality unspecified, unspecified chronicity Stable, not using tramadol- removed from medication list.   Follow up in  4 weeks  River Ambrosio K. Harle Battiest  [facility] [phone] 8 am - 5 pm) [phone] (after hours)

## 2017-05-11 ENCOUNTER — Telehealth: Payer: Self-pay | Admitting: *Deleted

## 2017-05-11 NOTE — Telephone Encounter (Signed)
Patient called and left message on Clinical Intake line stated that she had concerns regarding the Duloxetine she is taking.   Tried calling and number just rang. No Voicemail. Will try again.

## 2017-05-12 NOTE — Telephone Encounter (Signed)
Did she abruptly stop the lorazepam? She was supposed to cont both and then we were going to wean lorazepam. The abrupt stopping of lorazepam could have caused dizziness

## 2017-05-12 NOTE — Telephone Encounter (Signed)
I spoke with patient and she has decided that she does not need to take the duloxetine because it makes her dizzy. She would like to continue taking the lorazepam daily as needed.   Jean [name] stated that patient could stop duloxetine and continue lorazepam.

## 2017-05-12 NOTE — Telephone Encounter (Signed)
Patient stated that Janett Billow placed patient on Duloxetine. She started taking it last Wednesday and she was good with it. Fine no problem at all. Friday morning she woke up and was a little dizzy. She got up slowly and gradually felt ok. Saturday she just couldn't take the dizziness anymore. She took a Lorazepam and did not take the Duloxetine. Sunday she took another Lorazepam and was fine. Monday through today she has taken nothing. Wonders if she can just go back to just taking the Lorazepam once daily as needed and discontinue the Duloxetine due to dizziness. Please Advise.

## 2017-05-13 NOTE — Telephone Encounter (Signed)
Patient's medication list has been updated.  

## 2017-05-14 NOTE — Telephone Encounter (Signed)
Jean [name] notified and verbalized agreement. Informed pt to call the office if she has any concerns before her upcoming appointment at [facility].

## 2017-05-14 NOTE — Telephone Encounter (Signed)
It is okay to make this appt with [facility], to cont to use lorazepam as needed

## 2017-05-14 NOTE — Telephone Encounter (Signed)
I spoke with patient to clarify a whether or not she wanted Janett Billow to become pcp. She stated that she planned to stay with the providers at [facility].   I also asked about her use of lorazepam to see how often she was needing to take it. Pt stated that her instructions for use were to take 1 tablet daily as needed. Pt states that she has not taken any anxiety medication since last Saturday (5 days ago).   Pt would like to know if she needs to come to office to see Janett Billow for a follow up on anxiety and lorazepam usage.   Please advise.

## 2017-05-28 ENCOUNTER — Encounter: Payer: Self-pay | Admitting: Nurse Practitioner

## 2017-05-28 ENCOUNTER — Non-Acute Institutional Stay: Payer: PPO | Admitting: Nurse Practitioner

## 2017-05-28 DIAGNOSIS — K219 Gastro-esophageal reflux disease without esophagitis: Secondary | ICD-10-CM

## 2017-05-28 DIAGNOSIS — F5105 Insomnia due to other mental disorder: Secondary | ICD-10-CM

## 2017-05-28 DIAGNOSIS — F419 Anxiety disorder, unspecified: Secondary | ICD-10-CM | POA: Diagnosis not present

## 2017-05-28 DIAGNOSIS — N3281 Overactive bladder: Secondary | ICD-10-CM | POA: Diagnosis not present

## 2017-05-28 DIAGNOSIS — I10 Essential (primary) hypertension: Secondary | ICD-10-CM

## 2017-05-28 DIAGNOSIS — R609 Edema, unspecified: Secondary | ICD-10-CM | POA: Diagnosis not present

## 2017-05-28 DIAGNOSIS — M5440 Lumbago with sciatica, unspecified side: Secondary | ICD-10-CM

## 2017-05-28 DIAGNOSIS — K5900 Constipation, unspecified: Secondary | ICD-10-CM | POA: Diagnosis not present

## 2017-05-28 NOTE — Assessment & Plan Note (Signed)
1-2/night, continue Ditropan 5mg  qhs

## 2017-05-28 NOTE — Assessment & Plan Note (Signed)
Sleeps well at night, occasionally use Lorazepam.

## 2017-05-28 NOTE — Assessment & Plan Note (Signed)
Pain is in leg, Tylenol 650mg  qam, qd prn

## 2017-05-28 NOTE — Assessment & Plan Note (Signed)
Stable, diet controlled  

## 2017-05-28 NOTE — Assessment & Plan Note (Signed)
Stable, continue Omeprazole 40mg  daily, prn Mylanta

## 2017-05-28 NOTE — Assessment & Plan Note (Addendum)
c/o dizziness, generalized weakness for a few days, Tums relieved "chest pain", elevated Bp at home even if 138/80 upon my examination today, denied palpitation, SOB, dysuria, change of vision, or focal weakness.   taking Valsartan HCT 160/12.5mg  daily, taking Mega 3 and ASA 325mg  for CV risk reduction.  Obtain CT head w/o CM, EKG, CBC, CMP UA C/S, carotid US R+L Observe the patient.

## 2017-05-28 NOTE — Progress Notes (Signed)
Location:   [facility] (12)   Provider:  Levonne Carreras  NP   Patient Care Team: Estill Dooms, MD as PCP - General (Internal Medicine) Victoriya Pol X, NP as Nurse Practitioner (Internal Medicine)  Extended Emergency Contact Information Primary Emergency Contact: [facility] Address: [address]          [place], [place] [address] Johnnette Litter of [place] Phone: [phone] Mobile Phone: [phone] Relation: Son  Code Status:   Goals of care: Advanced Directive information Advanced Directives 05/28/2017  Does Patient Have a Medical Advance Directive? Yes  Type of Paramedic of [place];Living will  Does patient want to make changes to medical advance directive? No - Patient declined  Copy of [place] in Chart? Yes     Chief Complaint  Patient presents with  . Acute Visit    Elevated BP, SOB, dizzy    HPI:  Pt is a 83 y.o. female seen today for c/o dizziiness, generalized weakness for a few days,  Tums relieved "chest pain", elevated Bp at hoc/o dime even if 138/80 upon my examination today, denied palpitation, SOB, dysuria, or focal weakness. She is afebrile today.     Hx of  anxiety, Lorazepam prn, improved,  back pain, s/p Ortho consult, s/p spinal inj. Pain is controlled with Tylenol 650mg  qam, qd prn.   Hx of overactive bladder, taking Ditropan 5mg  daily. Blood pressure, taking Diovan-Hct 160/12.5mg . GERD stable on Omeprazole 40mg  daily.   Past Medical History:  Diagnosis Date  . GERD (gastroesophageal reflux disease)   . Hx of cataract surgery    both eyes  . Hyperglycemia   . Hyperlipidemia   . Hypertension   . Lumbago 04/05/2016  . Macular degeneration    ? which eye(s)  . Obstructive sleep apnea   . Osteoporosis, senile   . Overactive bladder   . Squamous cell skin cancer 2/16   left lower leg   Past Surgical History:  Procedure Laterality Date  . ABDOMINAL HYSTERECTOMY   1960's  . APPENDECTOMY  1960's  . MOLE REMOVAL  2000   facial  . TONSILLECTOMY      Allergies  Allergen Reactions  . Adhesive [Tape]   . Ampicillin   . Nitrofuran Derivatives   . Sulfur   . Zoloft [Sertraline Hcl] Other (See Comments)    Dizziness, nausea, faint feeling, and nightmares      Allergies as of 05/28/2017      Reactions   Adhesive [tape]    Ampicillin    Nitrofuran Derivatives    Sulfur    Zoloft [sertraline Hcl] Other (See Comments)   Dizziness, nausea, faint feeling, and nightmares        Medication List       Accurate as of 05/28/17  3:11 PM. Always use your most recent med list.          acetaminophen 325 MG tablet Commonly known as:  TYLENOL Take 650 mg by mouth every 6 (six) hours as needed.   aspirin 325 MG tablet Take 325 mg by mouth daily.   CALCIUM 600+D3 600-800 MG-UNIT Tabs Generic drug:  Calcium Carb-Cholecalciferol Take by mouth. Take one daily   Fish Oil 1200 MG Caps Take by mouth. Take one daily   ICAPS Caps Take by mouth. Take one I-Cap daily for eyes   loratadine 10 MG tablet Commonly known as:  CLARITIN Take 1 tablet (10 mg total)  by mouth daily.   LORazepam 0.5 MG tablet Commonly known as:  ATIVAN Take one tablet daily as needed for anxiety   omeprazole 40 MG capsule Commonly known as:  PRILOSEC TAKE 1 CAPSULE DAILY 1/2 HOUR BEFORE BREAKFAST FOR STOMACH ACID.   oxybutynin 5 MG tablet Commonly known as:  DITROPAN Take one tablet at bedtime for bladder   SYSTANE 0.4-0.3 % Soln Generic drug:  Polyethyl Glycol-Propyl Glycol Apply to eye. One drop both eyes three times daily   SYSTANE 0.4-0.3 % Gel ophthalmic gel Generic drug:  Polyethyl Glycol-Propyl Glycol Place 1 application into both eyes. Apply to eye lids at bedtime   valsartan-hydrochlorothiazide 160-12.5 MG tablet Commonly known as:  DIOVAN-HCT Take 1 tablet by mouth daily. For blood pressure       Review of Systems  Constitutional: Positive for  fatigue. Negative for activity change, appetite change, chills, diaphoresis, fever and unexpected weight change.       Fatigue, nausea, weakness, sweating, anxiety  HENT: Negative for congestion, ear discharge, ear pain, hearing loss, postnasal drip, rhinorrhea, sore throat, tinnitus, trouble swallowing and voice change.   Eyes: Negative for pain, redness, itching and visual disturbance.       Macular degeneration  Respiratory: Negative for cough, choking, shortness of breath and wheezing.        History of obstructive sleep apnea  Cardiovascular: Positive for leg swelling. Negative for chest pain and palpitations.       R ankle.  Gastrointestinal: Positive for constipation. Negative for abdominal distention, abdominal pain, diarrhea and nausea.       History GERD and reflux  Endocrine: Negative for cold intolerance, heat intolerance, polydipsia, polyphagia and polyuria.  Genitourinary: Positive for frequency. Negative for difficulty urinating, dysuria, flank pain, hematuria, pelvic pain, urgency and vaginal discharge.       Incontinent of urine. Has pessary.  Musculoskeletal: Positive for arthralgias and gait problem. Negative for back pain, myalgias, neck pain and neck stiffness.       Leg pain  Skin: Negative for color change, pallor and rash.  Allergic/Immunologic: Negative.   Neurological: Positive for dizziness. Negative for tremors, seizures, syncope, weakness, numbness and headaches.  Hematological: Negative for adenopathy. Does not bruise/bleed easily.  Psychiatric/Behavioral: Positive for sleep disturbance (insomnia). Negative for agitation, behavioral problems, confusion, dysphoric mood, hallucinations and suicidal ideas. The patient is nervous/anxious. The patient is not hyperactive.     Immunization History  Administered Date(s) Administered  . Influenza-Unspecified 09/27/2015, 10/09/2016  . Pneumococcal Conjugate-13 01/23/2015  . Pneumococcal Polysaccharide-23 10/20/2007  .  Td 12/14/2012   Pertinent  Health Maintenance Due  Topic Date Due  . DEXA SCAN  01/23/1991  . INFLUENZA VACCINE  07/29/2017  . PNA vac Low Risk Adult  Completed   Fall Risk  05/04/2017 03/26/2017 09/23/2016 05/29/2016 04/17/2016  Falls in the past year? No No No No No   Functional Status Survey:    Vitals:   05/28/17 1359  BP: 138/80  Pulse: 97  Resp: 20  Temp: 98.6 F (37 C)  SpO2: 94%  Weight: 185 lb 12.8 oz (84.3 kg)  Height: 5\' 5"  (1.651 m)   Body mass index is 30.92 kg/m. Physical Exam  Constitutional: She is oriented to person, place, and time. She appears well-developed and well-nourished. No distress.  HENT:  Right Ear: External ear normal.  Left Ear: External ear normal.  Nose: Nose normal.  Mouth/Throat: Oropharynx is clear and moist. No oropharyngeal exudate.  Eyes: Conjunctivae and EOM are normal. Pupils  are equal, round, and reactive to light. No scleral icterus.  Neck: No JVD present. No tracheal deviation present. No thyromegaly present.  Cardiovascular: Normal rate, regular rhythm, normal heart sounds and intact distal pulses.  Exam reveals no gallop and no friction rub.   No murmur heard. Pulmonary/Chest: Effort normal. No respiratory distress. She has no wheezes. She has no rales. She exhibits no tenderness.  Abdominal: She exhibits no distension and no mass. There is no tenderness.  Musculoskeletal: Normal range of motion. She exhibits edema. She exhibits no tenderness.  Tender in lower back. Unstable gait Trace edema R ankle.  Lymphadenopathy:    She has no cervical adenopathy.  Neurological: She is alert and oriented to person, place, and time. No cranial nerve deficit. Coordination normal.  Skin: No rash noted. She is not diaphoretic. No erythema. No pallor.  Psychiatric: She has a normal mood and affect. Her behavior is normal. Judgment and thought content normal.    Labs reviewed:  Recent Labs  09/18/16 01/07/17 0001 03/09/17 0001  NA 141 140  140  K 3.8 3.9 3.8  CL  --  104 104  CO2  --  25 26  GLUCOSE  --  99 105*  BUN 16 26* 26*  CREATININE 0.7 0.81 0.82  CALCIUM  --  9.4 10.0    Recent Labs  07/29/16 01/07/17 0001 03/09/17 0001  AST 19 23 26   ALT 12 15 19   ALKPHOS 59 71 72  BILITOT  --  0.4 0.4  PROT  --  6.1 6.1  ALBUMIN  --  3.7 4.1    Recent Labs  07/29/16 09/23/16 1206 01/07/17 0001  WBC 6.6 8.2 6.6  HGB 12.2 12.7 11.9  HCT 37 36.7* 36.6  MCV  --  89.8 91.5  PLT 172  --  229   Lab Results  Component Value Date   TSH 3.40 01/07/2017   Lab Results  Component Value Date   HGBA1C 5.7 03/18/2016   Lab Results  Component Value Date   CHOL 189 01/07/2017   HDL 57 01/07/2017   LDLCALC 120 (H) 01/07/2017   TRIG 58 01/07/2017   CHOLHDL 3.3 01/07/2017    Significant Diagnostic Results in last 30 days:  No results found.  Assessment/Plan HTN (hypertension) c/o dizziness, generalized weakness for a few days, Tums relieved "chest pain", elevated Bp at home even if 138/80 upon my examination today, denied palpitation, SOB, dysuria, change of vision, or focal weakness.   taking Valsartan HCT 160/12.5mg  daily, taking Mega 3 and ASA 325mg  for CV risk reduction.  Obtain CT head w/o CM, EKG, CBC, CMP UA C/S, carotid US R+L Observe the patient.   GERD (gastroesophageal reflux disease) Stable, continue Omeprazole 40mg  daily, prn Mylanta  Constipation Stable, diet controlled.   Overactive bladder 1-2/night, continue Ditropan 5mg  qhs  Lumbago Pain is in leg, Tylenol 650mg  qam, qd prn  Insomnia secondary to anxiety Sleeps well at night, occasionally use Lorazepam.   Edema 02/23/17 CT abd pelvis w/o CM IMPRESSION: No acute findings. Sigmoid diverticulosis.  No active diverticulitis. Large hiatal hernia. Aortoiliac atherosclerosis. Mild R leg.        Family/ staff Communication: continue IL  Labs/tests ordered: CT head w/o CM, CBC CMP UA C/S, Carotid artery [place] R+L

## 2017-05-28 NOTE — Assessment & Plan Note (Signed)
02/23/17 CT abd pelvis w/o CM IMPRESSION: No acute findings. Sigmoid diverticulosis.  No active diverticulitis. Large hiatal hernia. Aortoiliac atherosclerosis. Mild R leg.

## 2017-06-01 ENCOUNTER — Ambulatory Visit: Payer: PPO | Admitting: Nurse Practitioner

## 2017-06-09 ENCOUNTER — Telehealth: Payer: Self-pay | Admitting: *Deleted

## 2017-06-09 NOTE — Telephone Encounter (Signed)
Spoke with patient regarding her CT/US appointments, asked her if she had been informed of these dates and if she was happy with them. She stated that she was happy with them and she would be there at 10:15 on Friday.

## 2017-06-12 ENCOUNTER — Telehealth: Payer: Self-pay | Admitting: *Deleted

## 2017-06-12 ENCOUNTER — Other Ambulatory Visit: Payer: PPO

## 2017-06-12 NOTE — Telephone Encounter (Signed)
Patient called and stated that she was having chest pains x 3 days and when she would take her "acid" pill it would go away but today she is having them with no relief with medication. The apartment nurse took blood pressure and it was 160/40. Nurse told patient to call her PCP.   I called and spoke with Jean [name] that is covering for [facility] and she stated that she will call and speak with patient. Phone number given.

## 2017-06-12 NOTE — Telephone Encounter (Signed)
Monina called back and stated that she spoke with nurse at [facility] and she will be faxing [place] a form for Monina to fill out and sign to change patient's medication.  Once received in office please call Monina and let her know received so it can be faxed to her to sign and change medication.  Monina#:[phone]

## 2017-06-15 ENCOUNTER — Ambulatory Visit
Admission: RE | Admit: 2017-06-15 | Discharge: 2017-06-15 | Disposition: A | Discharge status: Home / Self Care | Payer: PPO | Source: Ambulatory Visit | Attending: Nurse Practitioner | Admitting: Nurse Practitioner

## 2017-06-15 DIAGNOSIS — I10 Essential (primary) hypertension: Secondary | ICD-10-CM

## 2017-06-15 DIAGNOSIS — I6523 Occlusion and stenosis of bilateral carotid arteries: Secondary | ICD-10-CM | POA: Diagnosis not present

## 2017-06-15 DIAGNOSIS — R51 Headache: Secondary | ICD-10-CM | POA: Diagnosis not present

## 2017-06-18 ENCOUNTER — Non-Acute Institutional Stay: Payer: PPO | Admitting: Nurse Practitioner

## 2017-06-18 ENCOUNTER — Encounter: Payer: Self-pay | Admitting: Nurse Practitioner

## 2017-06-18 DIAGNOSIS — I10 Essential (primary) hypertension: Secondary | ICD-10-CM

## 2017-06-18 DIAGNOSIS — R609 Edema, unspecified: Secondary | ICD-10-CM | POA: Diagnosis not present

## 2017-06-18 DIAGNOSIS — R5382 Chronic fatigue, unspecified: Secondary | ICD-10-CM

## 2017-06-18 DIAGNOSIS — K219 Gastro-esophageal reflux disease without esophagitis: Secondary | ICD-10-CM

## 2017-06-18 DIAGNOSIS — M5440 Lumbago with sciatica, unspecified side: Secondary | ICD-10-CM

## 2017-06-18 DIAGNOSIS — F5105 Insomnia due to other mental disorder: Secondary | ICD-10-CM | POA: Diagnosis not present

## 2017-06-18 DIAGNOSIS — F419 Anxiety disorder, unspecified: Secondary | ICD-10-CM

## 2017-06-18 DIAGNOSIS — N3281 Overactive bladder: Secondary | ICD-10-CM

## 2017-06-18 NOTE — Assessment & Plan Note (Addendum)
At her baseline. Update CBC BMP UA, TSH 3.4 01/07/17

## 2017-06-18 NOTE — Assessment & Plan Note (Signed)
Sleeps well at night, occasionally use Lorazepam.

## 2017-06-18 NOTE — Assessment & Plan Note (Addendum)
On and off, c/o "chest pain" which acid reflux in nature, continue protonix 40mg  daily, prn Mylanta. Change ASA from 325mg  to 81mg  daily.

## 2017-06-18 NOTE — Assessment & Plan Note (Signed)
1-2/night, continue Ditropan 5mg  qhs, Pessary, Urology q12m

## 2017-06-18 NOTE — Assessment & Plan Note (Signed)
taking Valsartan HCT 160/12.5mg  daily, taking Mega 3 and ASA 81mg  for CV risk reduction.

## 2017-06-18 NOTE — Progress Notes (Signed)
Location:   [facility] (12)   Provider:  Delta Pichon  NP   Patient Care Team: Estill Dooms, MD as PCP - General (Internal Medicine) Sadat Sliwa X, NP as Nurse Practitioner (Internal Medicine)  Extended Emergency Contact Information Primary Emergency Contact: [employer] Address: [address]          [place], [place] [address] Johnnette Litter of [place] Phone: [phone] Mobile Phone: [phone] Relation: Son  Code Status:   Goals of care: Advanced Directive information Advanced Directives 06/18/2017  Does Patient Have a Medical Advance Directive? Yes  Type of Paramedic of [place];Living will  Does patient want to make changes to medical advance directive? No - Patient declined  Copy of [place] in Chart? Yes     Chief Complaint  Patient presents with  . Medical Management of Chronic Issues    HPI:  Pt is a 83 y.o. female seen today for chronic medical conditions.     Hx of  anxiety, Lorazepam prn, improved,  back pain, s/p Ortho consult, s/p spinal inj. Pain is controlled with Tylenol 650mg  qam, qd prn.   Hx of overactive bladder, taking Ditropan 5mg  daily. Blood pressure, taking Diovan-Hct 160/12.5mg . GERD stable on Protonix 40mg  daily.   Past Medical History:  Diagnosis Date  . GERD (gastroesophageal reflux disease)   . Hx of cataract surgery    both eyes  . Hyperglycemia   . Hyperlipidemia   . Hypertension   . Lumbago 04/05/2016  . Macular degeneration    ? which eye(s)  . Obstructive sleep apnea   . Osteoporosis, senile   . Overactive bladder   . Squamous cell skin cancer 2/16   left lower leg   Past Surgical History:  Procedure Laterality Date  . ABDOMINAL HYSTERECTOMY  1960's  . APPENDECTOMY  1960's  . MOLE REMOVAL  2000   facial  . TONSILLECTOMY      Allergies  Allergen Reactions  . Adhesive [Tape]   . Ampicillin   . Nitrofuran Derivatives   . Sulfur     . Zoloft [Sertraline Hcl] Other (See Comments)    Dizziness, nausea, faint feeling, and nightmares      Allergies as of 06/18/2017      Reactions   Adhesive [tape]    Ampicillin    Nitrofuran Derivatives    Sulfur    Zoloft [sertraline Hcl] Other (See Comments)   Dizziness, nausea, faint feeling, and nightmares        Medication List       Accurate as of 06/18/17  3:13 PM. Always use your most recent med list.          acetaminophen 325 MG tablet Commonly known as:  TYLENOL Take 650 mg by mouth every 6 (six) hours as needed.   aspirin 325 MG tablet Take 325 mg by mouth daily.   CALCIUM 600+D3 600-800 MG-UNIT Tabs Generic drug:  Calcium Carb-Cholecalciferol Take by mouth. Take one daily   Fish Oil 1200 MG Caps Take by mouth. Take one daily   ICAPS Caps Take by mouth. Take one I-Cap daily for eyes   loratadine 10 MG tablet Commonly known as:  CLARITIN Take 1 tablet (10 mg total) by mouth daily.   LORazepam 0.5 MG tablet Commonly known as:  ATIVAN Take one tablet daily as needed for anxiety   omeprazole 40 MG capsule Commonly known as:  PRILOSEC TAKE 1 CAPSULE  DAILY 1/2 HOUR BEFORE BREAKFAST FOR STOMACH ACID.   oxybutynin 5 MG tablet Commonly known as:  DITROPAN Take one tablet at bedtime for bladder   pantoprazole 40 MG tablet Commonly known as:  PROTONIX Take 40 mg by mouth daily.   SYSTANE 0.4-0.3 % Soln Generic drug:  Polyethyl Glycol-Propyl Glycol Apply to eye. One drop both eyes three times daily   SYSTANE 0.4-0.3 % Gel ophthalmic gel Generic drug:  Polyethyl Glycol-Propyl Glycol Place 1 application into both eyes. Apply to eye lids at bedtime   valsartan-hydrochlorothiazide 160-12.5 MG tablet Commonly known as:  DIOVAN-HCT Take 1 tablet by mouth daily. For blood pressure       Review of Systems  Constitutional: Positive for fatigue. Negative for activity change, appetite change, chills, diaphoresis, fever and unexpected weight change.        Fatigue, nausea, weakness, sweating, anxiety  HENT: Negative for congestion, ear discharge, ear pain, hearing loss, postnasal drip, rhinorrhea, sore throat, tinnitus, trouble swallowing and voice change.   Eyes: Negative for pain, redness, itching and visual disturbance.       Macular degeneration  Respiratory: Negative for cough, choking, shortness of breath and wheezing.        History of obstructive sleep apnea  Cardiovascular: Positive for leg swelling. Negative for chest pain and palpitations.       R ankle.  Gastrointestinal: Positive for constipation. Negative for abdominal distention, abdominal pain, diarrhea and nausea.       History GERD and reflux  Endocrine: Negative for cold intolerance, heat intolerance, polydipsia, polyphagia and polyuria.  Genitourinary: Positive for frequency. Negative for difficulty urinating, dysuria, flank pain, hematuria, pelvic pain, urgency and vaginal discharge.       Incontinent of urine. Has pessary.  Musculoskeletal: Positive for arthralgias and gait problem. Negative for back pain, myalgias, neck pain and neck stiffness.       Leg pain  Skin: Negative for color change, pallor and rash.       Lots of moles, AKs, SKs.   Allergic/Immunologic: Negative.   Neurological: Negative for dizziness, tremors, seizures, syncope, weakness, numbness and headaches.  Hematological: Negative for adenopathy. Does not bruise/bleed easily.  Psychiatric/Behavioral: Positive for sleep disturbance (insomnia). Negative for agitation, behavioral problems, confusion, dysphoric mood, hallucinations and suicidal ideas. The patient is nervous/anxious. The patient is not hyperactive.     Immunization History  Administered Date(s) Administered  . Influenza-Unspecified 09/27/2015, 10/09/2016  . Pneumococcal Conjugate-13 01/23/2015  . Pneumococcal Polysaccharide-23 10/20/2007  . Td 12/14/2012   Pertinent  Health Maintenance Due  Topic Date Due  . DEXA SCAN  01/23/1991   . INFLUENZA VACCINE  07/29/2017  . PNA vac Low Risk Adult  Completed   Fall Risk  05/04/2017 03/26/2017 09/23/2016 05/29/2016 04/17/2016  Falls in the past year? No No No No No   Functional Status Survey:    Vitals:   06/18/17 1404  BP: 130/72  Pulse: 88  Resp: (!) 22  Temp: 99 F (37.2 C)  Weight: 184 lb 12.8 oz (83.8 kg)  Height: 5\' 5"  (1.651 m)   Body mass index is 30.75 kg/m. Physical Exam  Constitutional: She is oriented to person, place, and time. She appears well-developed and well-nourished. No distress.  HENT:  Right Ear: External ear normal.  Left Ear: External ear normal.  Nose: Nose normal.  Mouth/Throat: Oropharynx is clear and moist. No oropharyngeal exudate.  Eyes: Conjunctivae and EOM are normal. Pupils are equal, round, and reactive to light. No scleral icterus.  Neck: No JVD present. No tracheal deviation present. No thyromegaly present.  Cardiovascular: Normal rate, regular rhythm, normal heart sounds and intact distal pulses.  Exam reveals no gallop and no friction rub.   No murmur heard. Pulmonary/Chest: Effort normal. No respiratory distress. She has no wheezes. She has no rales. She exhibits no tenderness.  Abdominal: She exhibits no distension and no mass. There is no tenderness.  Musculoskeletal: Normal range of motion. She exhibits edema. She exhibits no tenderness.  Tender in lower back. Unstable gait Trace edema R ankle.  Lymphadenopathy:    She has no cervical adenopathy.  Neurological: She is alert and oriented to person, place, and time. No cranial nerve deficit. Coordination normal.  Skin: No rash noted. She is not diaphoretic. No erythema. No pallor.  Lots of moles, AKs, SKs.   Psychiatric: She has a normal mood and affect. Her behavior is normal. Judgment and thought content normal.    Labs reviewed:  Recent Labs  09/18/16 01/07/17 0001 03/09/17 0001  NA 141 140 140  K 3.8 3.9 3.8  CL  --  104 104  CO2  --  25 26  GLUCOSE  --  99  105*  BUN 16 26* 26*  CREATININE 0.7 0.81 0.82  CALCIUM  --  9.4 10.0    Recent Labs  07/29/16 01/07/17 0001 03/09/17 0001  AST 19 23 26   ALT 12 15 19   ALKPHOS 59 71 72  BILITOT  --  0.4 0.4  PROT  --  6.1 6.1  ALBUMIN  --  3.7 4.1    Recent Labs  07/29/16 09/23/16 1206 01/07/17 0001  WBC 6.6 8.2 6.6  HGB 12.2 12.7 11.9  HCT 37 36.7* 36.6  MCV  --  89.8 91.5  PLT 172  --  229   Lab Results  Component Value Date   TSH 3.40 01/07/2017   Lab Results  Component Value Date   HGBA1C 5.7 03/18/2016   Lab Results  Component Value Date   CHOL 189 01/07/2017   HDL 57 01/07/2017   LDLCALC 120 (H) 01/07/2017   TRIG 58 01/07/2017   CHOLHDL 3.3 01/07/2017    Significant Diagnostic Results in last 30 days:  Ct Head Wo Contrast  Result Date: 06/15/2017 CLINICAL DATA:  Headaches for months EXAM: CT HEAD WITHOUT CONTRAST TECHNIQUE: Contiguous axial images were obtained from the base of the skull through the vertex without intravenous contrast. COMPARISON:  None. FINDINGS: Brain: Scattered decreased attenuation is noted throughout the deep white matter consistent with chronic ischemic change. No significant atrophic changes are noted. No findings to suggest acute hemorrhage, acute infarction or space-occupying mass lesion are noted. Old lacunar infarct is noted the basal ganglia on the right. Vascular: No hyperdense vessel or unexpected calcification. Skull: Normal. Negative for fracture or focal lesion. Sinuses/Orbits: No acute abnormality noted. Other: None. IMPRESSION: Chronic changes without acute abnormality. Electronically Signed   By: Inez Catalina M.D.   On: 06/15/2017 11:12   US Carotid Duplex Bilateral  Result Date: 06/15/2017 CLINICAL DATA:  83 year old female with essential hypertension and presyncope/dizziness EXAM: BILATERAL CAROTID DUPLEX ULTRASOUND TECHNIQUE: Pearline Cables scale imaging, color Doppler and duplex ultrasound were performed of bilateral carotid and vertebral  arteries in the neck. COMPARISON:  None FINDINGS: Criteria: Quantification of carotid stenosis is based on velocity parameters that correlate the residual internal carotid diameter with NASCET-based stenosis levels, using the diameter of the distal internal carotid lumen as the denominator for stenosis measurement. The following velocity measurements  were obtained: RIGHT ICA:  69/21 cm/sec CCA:  185/63 cm/sec SYSTOLIC ICA/CCA RATIO:  0.6 DIASTOLIC ICA/CCA RATIO:  1.4 ECA:  109 cm/sec LEFT ICA:  109/28 cm/sec CCA:  149/70 cm/sec SYSTOLIC ICA/CCA RATIO:  0.9 DIASTOLIC ICA/CCA RATIO:  2.3 ECA:  75 cm/sec RIGHT CAROTID ARTERY: Minimal atherosclerotic plaque in the common carotid artery. No significant atherosclerotic plaque or evidence of stenosis in the internal carotid artery. RIGHT VERTEBRAL ARTERY:  Patent with normal antegrade flow. LEFT CAROTID ARTERY: Minimal atherosclerotic plaque in the common carotid artery. No significant atherosclerotic plaque or evidence of stenosis in the internal carotid artery. LEFT VERTEBRAL ARTERY:  Patent with normal antegrade flow. IMPRESSION: 1. Minimal atherosclerotic plaque in the bilateral common carotid arteries without evidence of stenosis. 2. No significant atherosclerotic plaque or evidence of stenosis in the internal carotid arteries. 3. Vertebral arteries are patent with normal antegrade flow. Signed, Criselda Peaches, MD [facility] Radiology Electronically Signed   By: Jacqulynn Cadet M.D.   On: 06/15/2017 11:57    Assessment/Plan Lumbago Pain is in the right leg, some numbness, Tylenol 650mg  qam, qd prn. Delayed Ortho or narcotic, PT evaluate and treat as indicated.   Overactive bladder 1-2/night, continue Ditropan 5mg  qhs, Pessary, Urology q68m  Edema 02/23/17 CT abd pelvis w/o CM IMPRESSION: No acute findings. Sigmoid diverticulosis.  No active diverticulitis. Large hiatal hernia. Aortoiliac  atherosclerosis. Trace R leg. Continue HCT. Denied DOE, paroxysmal nocturnal orthopnea, cough, or sputum production. observe  GERD (gastroesophageal reflux disease) On and off, c/o "chest pain" which acid reflux in nature, continue protonix 40mg  daily, prn Mylanta. Change ASA from 325mg  to 81mg  daily.   HTN (hypertension) taking Valsartan HCT 160/12.5mg  daily, taking Mega 3 and ASA 81mg  for CV risk reduction.   Insomnia secondary to anxiety Sleeps well at night, occasionally use Lorazepam.   Fatigue At her baseline. Update CBC BMP UA, TSH 3.4 01/07/17       Family/ staff Communication: continue IL  Labs/tests ordered: PT, scheduled CBC BMP UA

## 2017-06-18 NOTE — Assessment & Plan Note (Signed)
Pain is in the right leg, some numbness, Tylenol 650mg  qam, qd prn. Delayed Ortho or narcotic, PT evaluate and treat as indicated.

## 2017-06-18 NOTE — Assessment & Plan Note (Addendum)
02/23/17 CT abd pelvis w/o CM IMPRESSION: No acute findings. Sigmoid diverticulosis.  No active diverticulitis. Large hiatal hernia. Aortoiliac atherosclerosis. Trace R leg. Continue HCT. Denied DOE, paroxysmal nocturnal orthopnea, cough, or sputum production. observe

## 2017-06-26 DIAGNOSIS — G4733 Obstructive sleep apnea (adult) (pediatric): Secondary | ICD-10-CM | POA: Diagnosis not present

## 2017-07-02 DIAGNOSIS — G4733 Obstructive sleep apnea (adult) (pediatric): Secondary | ICD-10-CM | POA: Diagnosis not present

## 2017-07-06 DIAGNOSIS — N3281 Overactive bladder: Secondary | ICD-10-CM | POA: Diagnosis not present

## 2017-07-06 DIAGNOSIS — I10 Essential (primary) hypertension: Secondary | ICD-10-CM | POA: Diagnosis not present

## 2017-07-06 DIAGNOSIS — K219 Gastro-esophageal reflux disease without esophagitis: Secondary | ICD-10-CM | POA: Diagnosis not present

## 2017-07-07 ENCOUNTER — Non-Acute Institutional Stay: Payer: PPO | Admitting: Internal Medicine

## 2017-07-07 ENCOUNTER — Encounter: Payer: Self-pay | Admitting: Internal Medicine

## 2017-07-07 VITALS — BP 124/68 | HR 75 | Temp 98.5°F | Resp 18 | Ht 65.0 in | Wt 178.2 lb

## 2017-07-07 DIAGNOSIS — N3281 Overactive bladder: Secondary | ICD-10-CM | POA: Diagnosis not present

## 2017-07-07 DIAGNOSIS — I878 Other specified disorders of veins: Secondary | ICD-10-CM | POA: Diagnosis not present

## 2017-07-07 DIAGNOSIS — K219 Gastro-esophageal reflux disease without esophagitis: Secondary | ICD-10-CM | POA: Diagnosis not present

## 2017-07-07 DIAGNOSIS — G8929 Other chronic pain: Secondary | ICD-10-CM | POA: Diagnosis not present

## 2017-07-07 DIAGNOSIS — I10 Essential (primary) hypertension: Secondary | ICD-10-CM | POA: Diagnosis not present

## 2017-07-07 DIAGNOSIS — M5440 Lumbago with sciatica, unspecified side: Secondary | ICD-10-CM | POA: Diagnosis not present

## 2017-07-07 DIAGNOSIS — K5901 Slow transit constipation: Secondary | ICD-10-CM | POA: Diagnosis not present

## 2017-07-07 DIAGNOSIS — E782 Mixed hyperlipidemia: Secondary | ICD-10-CM | POA: Diagnosis not present

## 2017-07-07 DIAGNOSIS — D649 Anemia, unspecified: Secondary | ICD-10-CM

## 2017-07-07 DIAGNOSIS — J301 Allergic rhinitis due to pollen: Secondary | ICD-10-CM | POA: Diagnosis not present

## 2017-07-07 LAB — COMPREHENSIVE METABOLIC PANEL
ALK PHOS: 59 U/L (ref 33–130)
ALT: 13 U/L (ref 6–29)
AST: 20 U/L (ref 10–35)
Albumin: 3.8 g/dL (ref 3.6–5.1)
BUN: 23 mg/dL (ref 7–25)
CALCIUM: 9.4 mg/dL (ref 8.6–10.4)
CHLORIDE: 104 mmol/L (ref 98–110)
CO2: 27 mmol/L (ref 20–31)
Creat: 0.86 mg/dL (ref 0.60–0.88)
GLUCOSE: 99 mg/dL (ref 65–99)
POTASSIUM: 3.6 mmol/L (ref 3.5–5.3)
Sodium: 140 mmol/L (ref 135–146)
Total Bilirubin: 0.5 mg/dL (ref 0.2–1.2)
Total Protein: 5.7 g/dL — ABNORMAL LOW (ref 6.1–8.1)

## 2017-07-07 LAB — CBC
HEMATOCRIT: 31.8 % — AB (ref 35.0–45.0)
Hemoglobin: 10.9 g/dL — ABNORMAL LOW (ref 11.7–15.5)
MCH: 28.7 pg (ref 27.0–33.0)
MCHC: 34.3 g/dL (ref 32.0–36.0)
MCV: 83.7 fL (ref 80.0–100.0)
MPV: 8.9 fL (ref 7.5–12.5)
PLATELETS: 205 10*3/uL (ref 140–400)
RBC: 3.8 MIL/uL (ref 3.80–5.10)
RDW: 16.8 % — AB (ref 11.0–15.0)
WBC: 7.3 10*3/uL (ref 3.8–10.8)

## 2017-07-07 LAB — URINALYSIS

## 2017-07-07 MED ORDER — TRAMADOL HCL 50 MG PO TABS: 50.0000 mg | ORAL_TABLET | Freq: Four times a day (QID) | ORAL | 0 refills | Status: DC | PRN

## 2017-07-07 MED ORDER — LORATADINE 10 MG PO TABS: 10.0000 mg | ORAL_TABLET | Freq: Every day | ORAL | 3 refills | Status: DC | PRN

## 2017-07-07 MED ORDER — ASPIRIN EC 81 MG PO TBEC: 81.0000 mg | DELAYED_RELEASE_TABLET | Freq: Every day | ORAL | 3 refills | Status: DC

## 2017-07-07 NOTE — Progress Notes (Signed)
[facility]  Provider: Blanchie Serve [facility]   Location:  [place] of Service:  Clinic (12)  PCP: Blanchie [name], Jean [name], Jean [name], Jean [name], Jean [name], Jean [name] of [place] Phone: [phone] Mobile Phone: [phone] Relation: Son   Goals of Care: Advanced Directive information Advanced Directives 06/18/2017  Does Patient Have a Medical Advance Directive? Yes  Type of Paramedic of [place];Living will  Does patient want to make changes to medical advance directive? No - Patient declined  Copy of [place] in Chart? Yes      Chief Complaint  Patient presents with  . Medical Management of Chronic Issues    Patient wants to talk about switching from protonix back to prilosec due to the protonix making her vomit and have chest pains which has no resolved.   . Medication Management    has some questions about switching doses of aspirin     HPI: Patient is a 83 y.o. female seen today for routine visit.   Heartburn- 1 month ago she was switched from omeprazole to pantoprazole because of increased heartburn. Pantoprazole made her vomit and feel sick in her stomach. She thus stopped it 4 days back and is now taking omeprazole. She is avoiding eating to prevent vomiting. She denies any nausea and vomiting [name] 2 days. No known history of GI bleed or gastric ulcers. Never had EGD done in the past.   OAB- ongoing for several years. Denies any dysuria or hematuria. Wakes up once at night to urinate. Uses pads.   HTN- stable BP reading, taking her meds and tolerating it well  Constipation- prunes help, keeps herself hydrated  Recurrent UTI- currently symptom  free  Lumbago- on prn tramadol, has not required any recently, takes only tylenol if needed. Wants to keep her tramadol for now with upcoming trip to [place].   Anxiety- stable on current dose of benzodiazepine  Past Medical History:  Diagnosis Date  . GERD (gastroesophageal reflux disease)   . Hx of cataract surgery    both eyes  . Hyperglycemia   . Hyperlipidemia   . Hypertension   . Lumbago 04/05/2016  . Macular degeneration    ? which eye(s)  . Obstructive sleep apnea   . Osteoporosis, senile   . Overactive bladder   . Squamous cell skin cancer 2/16   left lower leg   Past Surgical History:  Procedure Laterality Date  . ABDOMINAL HYSTERECTOMY  1960's  . APPENDECTOMY  1960's  . MOLE REMOVAL  2000   facial  . TONSILLECTOMY      reports that she has never smoked. She has never used smokeless tobacco. She reports that she drinks alcohol. She reports that she does not use drugs. Social History   Social History  . Marital status: Widowed    Spouse name: N/A  . Number of children: N/A  . 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 Smoking status: Never Smoker  . Smokeless tobacco: Never Used  . Alcohol use Yes     Comment: occassionally 1/2 glass of wine 1-2 [name] month  . Drug use: No  .  Sexual activity: No   Other Topics Concern  . Not on file   Social History Narrative   Lives at [facility] since 11/12[facility]15   Widow   Never smoked   Alcohol occasionally 1/2 glass of wine   Exercise none   POA, Living Will   Walks with walker       Functional Status Survey:    Family History  Problem Relation Age of Onset  . Heart disease Mother   . Heart disease Father     Health Maintenance  Topic Date Due  . DEXA SCAN  01/23/1991  . INFLUENZA VACCINE  07/29/2017  . TETANUS/TDAP  12/14/2022  . PNA vac Low Risk Adult  Completed    Allergies  Allergen Reactions  . Adhesive [Tape]   .  Ampicillin   . Nitrofuran Derivatives   . Sulfur   . Zoloft [Sertraline Hcl] Other (See Comments)    Dizziness, nausea, faint feeling, and nightmares      Outpatient Encounter Prescriptions as of 07/07/2017  Medication Sig  . acetaminophen (TYLENOL) 325 MG tablet Take 650 mg by mouth daily. She also takes 650 mg by mouth as needed when she has pain  . aspirin EC 81 MG tablet Take 81 mg by mouth daily.  . Calcium Carb-Cholecalciferol (CALCIUM 600+D3) 600-800 MG-UNIT TABS Take by mouth. Take one daily  . loratadine (CLARITIN) 10 MG tablet Take 1 tablet (10 mg total) by mouth daily.  Marland Kitchen LORazepam (ATIVAN) 0.5 MG tablet Take 0.5 mg by mouth daily.  . Menthol, Topical Analgesic, (BIOFREEZE EX) Apply 1 application topically as needed.  . Multiple Vitamins-Minerals (ICAPS) CAPS Take by mouth. Take one I-Cap daily for eyes  . Omega-3 Fatty Acids (FISH OIL) 1200 MG CAPS Take by mouth. Take one daily  . omeprazole (PRILOSEC) 40 MG capsule TAKE 1 CAPSULE DAILY 1/2 HOUR BEFORE BREAKFAST FOR STOMACH ACID.  Marland Kitchen oxybutynin (DITROPAN) 5 MG tablet Take one tablet at bedtime for bladder  . Polyethyl Glycol-Propyl Glycol (SYSTANE) 0.4-0.3 % GEL ophthalmic gel Place 1 application into both eyes. Apply to eye lids at bedtime  . valsartan-hydrochlorothiazide (DIOVAN-HCT) 160-12.5 MG tablet Take 1 tablet by mouth daily. For blood pressure  . [DISCONTINUED] aspirin 325 MG tablet Take 325 mg by mouth daily.  . [DISCONTINUED] pantoprazole (PROTONIX) 40 MG tablet Take 40 mg by mouth daily.   . [DISCONTINUED] LORazepam (ATIVAN) 0.5 MG tablet Take one tablet daily as needed for anxiety  . [DISCONTINUED] Polyethyl Glycol-Propyl Glycol (SYSTANE) 0.4-0.3 % SOLN Apply to eye. One drop both eyes three times daily   No facility-administered encounter medications on file as of 07/07/2017.     Review of Systems  Constitutional: Negative for appetite change, chills, diaphoresis and fever.       Has occasional hot flashes. Has  intentional weight loss.   HENT: Negative for congestion, hearing loss, mouth sores, postnasal drip, sore throat and trouble swallowing.   Eyes: Positive for visual disturbance.       Corrective glasses. Has had cataracts removed. Has early stages of macular degeneration and follows with ophthalmology.   Respiratory: Positive for shortness of breath. Negative for cough.        With exertion.   Cardiovascular: Positive for leg swelling. Negative for chest pain and palpitations.       Increased swelling towards end of the day  Gastrointestinal: Positive for constipation. Negative for abdominal distention, abdominal pain, blood in stool, diarrhea, nausea and vomiting.  Prunes help  Genitourinary: Positive for frequency. Negative for dysuria, hematuria and pelvic pain.  Musculoskeletal: Positive for back pain.       Chronic back pain radiating to her legs, uses a rolling walker with seat  Skin: Negative for rash.  Neurological: Positive for weakness and numbness. Negative for dizziness and seizures.       Numbness and tingling to her legs has been chronic  Psychiatric/Behavioral: Negative for behavioral problems and dysphoric mood. The patient is not nervous/anxious.     Vitals:   07/07/17 0846  BP: 124/68  Pulse: 75  Resp: 18  Temp: 98.5 F (36.9 C)  TempSrc: Oral  SpO2: 95%  Weight: 178 lb 3.2 oz (80.8 kg)  Height: 5\' 5"  (1.651 m)   Body mass index is 29.65 kg/m.   Wt Readings from Last 3 Encounters:  07/07/17 178 lb 3.2 oz (80.8 kg)  06/18/17 184 lb 12.8 oz (83.8 kg)  05/28/17 185 lb 12.8 oz (84.3 kg)   Physical Exam  Constitutional: She is oriented to person, place, and time. No distress.  Overweight, elderly  HENT:  Head: Normocephalic and atraumatic.  Mouth/Throat: Oropharynx is clear and moist.  Eyes: Conjunctivae and EOM are normal. Pupils are equal, round, and reactive to light.  Has glasses  Neck: Neck supple.  Cardiovascular: Normal rate and regular rhythm.    Pulmonary/Chest: Effort normal.  Abdominal: Soft. Bowel sounds are normal.  Musculoskeletal: Normal range of motion. She exhibits edema.  Can move all 4 extremities, uses walker with seat, trace leg edema  Lymphadenopathy:    She has no cervical adenopathy.  Neurological: She is alert and oriented to person, place, and time.  Skin: Skin is warm and dry. She is not diaphoretic.  Psychiatric: She has a normal mood and affect. Her behavior is normal.    Labs reviewed: Basic Metabolic Panel:  Recent Labs  01/07/17 0001 03/09/17 0001 07/06/17 1542  NA 140 140 140  K 3.9 3.8 3.6  CL 104 104 104  CO2 25 26 27   GLUCOSE 99 105* 99  BUN 26* 26* 23  CREATININE 0.81 0.82 0.86  CALCIUM 9.4 10.0 9.4   Liver Function Tests:  Recent Labs  01/07/17 0001 03/09/17 0001 07/06/17 1542  AST 23 26 20   ALT 15 19 13   ALKPHOS 71 72 59  BILITOT 0.4 0.4 0.5  PROT 6.1 6.1 5.7*  ALBUMIN 3.7 4.1 3.8    Recent Labs  03/09/17 0001  LIPASE 7  AMYLASE 41   No results for input(s): AMMONIA in the last 8760 hours. CBC:  Recent Labs  07/29/16 09/23/16 1206 01/07/17 0001 07/06/17 1542  WBC 6.6 8.2 6.6 7.3  HGB 12.2 12.7 11.9 10.9*  HCT 37 36.7* 36.6 31.8*  MCV  --  89.8 91.5 83.7  PLT 172  --  229 205   Cardiac Enzymes: No results for input(s): CKTOTAL, CKMB, CKMBINDEX, TROPONINI in the last 8760 hours. BNP: Invalid input(s): POCBNP Lab Results  Component Value Date   HGBA1C 5.7 03/18/2016   Lab Results  Component Value Date   TSH 3.40 01/07/2017   No results found for: VITAMINB12 No results found for: FOLATE No results found for: IRON, TIBC, FERRITIN  Lipid Panel:  Recent Labs  01/07/17 0001  CHOL 189  HDL 57  LDLCALC 120*  TRIG 58  CHOLHDL 3.3   Lab Results  Component Value Date   HGBA1C 5.7 03/18/2016    Procedures since last visit: Ct Head Wo Contrast  Result Date: 06/15/2017 CLINICAL DATA:  Headaches for months EXAM: CT HEAD WITHOUT CONTRAST  TECHNIQUE: Contiguous axial images were obtained from the base of the skull through the vertex without intravenous contrast. COMPARISON:  None. FINDINGS: Brain: Scattered decreased attenuation is noted throughout the deep white matter consistent with chronic ischemic change. No significant atrophic changes are noted. No findings to suggest acute hemorrhage, acute infarction or space-occupying mass lesion are noted. Old lacunar infarct is noted the basal ganglia on the right. Vascular: No hyperdense vessel or unexpected calcification. Skull: Normal. Negative for fracture or focal lesion. Sinuses/Orbits: No acute abnormality noted. Other: None. IMPRESSION: Chronic changes without acute abnormality. Electronically Signed   By: Inez Catalina M.D.   On: 06/15/2017 11:12   US Carotid Duplex Bilateral  Result Date: 06/15/2017 CLINICAL DATA:  83 year old female with essential hypertension and presyncope/dizziness EXAM: BILATERAL CAROTID DUPLEX ULTRASOUND TECHNIQUE: Pearline Cables scale imaging, color Doppler and duplex ultrasound were performed of bilateral carotid and vertebral arteries in the neck. COMPARISON:  None FINDINGS: Criteria: Quantification of carotid stenosis is based on velocity parameters that correlate the residual internal carotid diameter with NASCET-based stenosis levels, using the diameter of the distal internal carotid lumen as the denominator for stenosis measurement. The following velocity measurements were obtained: RIGHT ICA:  69/21 cm/sec CCA:  397/67 cm/sec SYSTOLIC ICA/CCA RATIO:  0.6 DIASTOLIC ICA/CCA RATIO:  1.4 ECA:  109 cm/sec LEFT ICA:  109/28 cm/sec CCA:  341/93 cm/sec SYSTOLIC ICA/CCA RATIO:  0.9 DIASTOLIC ICA/CCA RATIO:  2.3 ECA:  75 cm/sec RIGHT CAROTID ARTERY: Minimal atherosclerotic plaque in the common carotid artery. No significant atherosclerotic plaque or evidence of stenosis in the internal carotid artery. RIGHT VERTEBRAL ARTERY:  Patent with normal antegrade flow. LEFT CAROTID ARTERY:  Minimal atherosclerotic plaque in the common carotid artery. No significant atherosclerotic plaque or evidence of stenosis in the internal carotid artery. LEFT VERTEBRAL ARTERY:  Patent with normal antegrade flow. IMPRESSION: 1. Minimal atherosclerotic plaque in the bilateral common carotid arteries without evidence of stenosis. 2. No significant atherosclerotic plaque or evidence of stenosis in the internal carotid arteries. 3. Vertebral arteries are patent with normal antegrade flow. Signed, Criselda Peaches, [facility] Electronically Signed   By: Jacqulynn Cadet M.D.   On: 06/15/2017 11:57    Assessment/Plan  1. Gastroesophageal reflux disease without esophagitis D/c pantoprazole. Pt tolerating omeprazole well and denies any symptom this visit. Monitor clinically. Decrease aspirin to 81 mg daily and place her on EC given her drop in Hb - CBC (no diff)  2. Essential hypertension Continue valsartan-hctz, monitor bmp - CMP  3. Overactive bladder Stable, on oxybutynin, perineal hygiene to be maintained - Urinalysis - Culture, Urine  4. Mixed hyperlipidemia Stable lipid panel in jan, check it once a year, not on statin. Continue fish oil  5. Venous stasis Keep legs elevated at rest, continue skin care and foot care, continue aspirin  6. Slow transit constipation Continue hydration and prune  7. Chronic bilateral low back pain with sciatica, sciatica laterality unspecified Overall stable, continue tylenol as needed for now and tramadol only for severe pain. Continue ca-vit d  8. Anemia, unspecified type Drop in Hb noted, has complaints of nausea and vomiting with increased heartburn last month that has now resolved. Concern for gastritis with drop in Hb. aslo on aspirin 325 mg daily that can increase risk for gi bleed. Denies any melena, blood in stool and her nausea/ vomiting has resolved. Check cbc with  ferritin, Q42  and folic acid level. Decrease aspirin to 81 mg daily EC for now. Reassess in 4 weeks  9. Allergic rhinitis Stable, change claritin to daily as needed only and monitor   Labs/tests ordered:  Cbc, ferritin, J03, folic acid in 2 weeks  Next appointment: 4 weeks or earlier if needed  Communication: reviewed care plan with pt    Blanchie Serve, [facility] [place], [place] [address] Cell Phone (Monday-Friday 8 am - 5 pm): [phone] On Call: [phone] and follow prompts after 5 pm and on weekends Office Phone: [phone] Office Fax: [phone]

## 2017-07-10 ENCOUNTER — Other Ambulatory Visit: Payer: Self-pay

## 2017-07-10 DIAGNOSIS — D649 Anemia, unspecified: Secondary | ICD-10-CM

## 2017-07-10 DIAGNOSIS — D619 Aplastic anemia, unspecified: Secondary | ICD-10-CM

## 2017-07-10 DIAGNOSIS — K219 Gastro-esophageal reflux disease without esophagitis: Secondary | ICD-10-CM

## 2017-07-17 ENCOUNTER — Telehealth: Payer: Self-pay | Admitting: *Deleted

## 2017-07-17 NOTE — Telephone Encounter (Signed)
Discontinue valsartan-HCTZ order. Start losartan-HCTZ 100-12.5 mg po daily.

## 2017-07-17 NOTE — Telephone Encounter (Signed)
Received fax from [facility] [phone] stating need replacement medication for Valsartan due to it being recalled. Please Advise.

## 2017-07-20 ENCOUNTER — Other Ambulatory Visit: Payer: Self-pay

## 2017-07-20 DIAGNOSIS — D649 Anemia, unspecified: Secondary | ICD-10-CM | POA: Diagnosis not present

## 2017-07-20 DIAGNOSIS — K219 Gastro-esophageal reflux disease without esophagitis: Secondary | ICD-10-CM | POA: Diagnosis not present

## 2017-07-20 MED ORDER — LOSARTAN POTASSIUM-HCTZ 100-12.5 MG PO TABS: 1.0000 | ORAL_TABLET | Freq: Every day | ORAL | 3 refills | Status: DC

## 2017-07-20 NOTE — Telephone Encounter (Signed)
New script was sent to [facility]

## 2017-07-21 LAB — CBC
HEMATOCRIT: 33.7 % — AB (ref 35.0–45.0)
Hemoglobin: 10.9 g/dL — ABNORMAL LOW (ref 11.7–15.5)
MCH: 28.5 pg (ref 27.0–33.0)
MCHC: 32.3 g/dL (ref 32.0–36.0)
MCV: 88 fL (ref 80.0–100.0)
MPV: 8.5 fL (ref 7.5–12.5)
Platelets: 219 10*3/uL (ref 140–400)
RBC: 3.83 MIL/uL (ref 3.80–5.10)
RDW: 16.7 % — AB (ref 11.0–15.0)
WBC: 6.5 10*3/uL (ref 3.8–10.8)

## 2017-07-22 ENCOUNTER — Other Ambulatory Visit: Payer: Self-pay

## 2017-07-22 DIAGNOSIS — K219 Gastro-esophageal reflux disease without esophagitis: Secondary | ICD-10-CM

## 2017-07-22 LAB — FOLATE: FOLATE: 13.5 ng/mL (ref >5.4)

## 2017-07-22 LAB — VITAMIN B12: Vitamin B-12: 420 pg/mL (ref 200–1100)

## 2017-07-22 LAB — FERRITIN: Ferritin: 10 ng/mL — ABNORMAL LOW (ref 20–288)

## 2017-07-22 MED ORDER — FERROUS SULFATE 325 (65 FE) MG PO TABS: 325.0000 mg | ORAL_TABLET | Freq: Two times a day (BID) | ORAL | 3 refills | Status: DC

## 2017-07-24 ENCOUNTER — Telehealth: Payer: Self-pay

## 2017-07-24 NOTE — Telephone Encounter (Signed)
A fax was received from [facility] stating that the diagnosis codes used for the folate and vitamin B12 tests done on 07/20/17 are not covered.   The Dx codes used were: D64.9 and K21.9.   Please advise on any additional codes that can be used for these tests.

## 2017-07-27 NOTE — Telephone Encounter (Signed)
D64.9 is the code.

## 2017-07-27 NOTE — Telephone Encounter (Signed)
That code is not covered by insurance for future reference. This office will have to cover the cost for this test for the patient

## 2017-08-04 ENCOUNTER — Non-Acute Institutional Stay: Payer: PPO | Admitting: Internal Medicine

## 2017-08-04 ENCOUNTER — Encounter: Payer: Self-pay | Admitting: Internal Medicine

## 2017-08-04 VITALS — BP 124/68 | HR 74 | Temp 98.9°F | Resp 18 | Ht 65.0 in

## 2017-08-04 DIAGNOSIS — K219 Gastro-esophageal reflux disease without esophagitis: Secondary | ICD-10-CM | POA: Diagnosis not present

## 2017-08-04 DIAGNOSIS — F418 Other specified anxiety disorders: Secondary | ICD-10-CM

## 2017-08-04 DIAGNOSIS — D509 Iron deficiency anemia, unspecified: Secondary | ICD-10-CM

## 2017-08-04 MED ORDER — LORAZEPAM 0.5 MG PO TABS: 0.5000 mg | ORAL_TABLET | Freq: Every day | ORAL | 0 refills | Status: DC | PRN

## 2017-08-04 NOTE — Progress Notes (Signed)
[facility]  Provider: Blanchie Serve MD   Location:  [place] of Service:  Clinic (12)  PCP: Blanchie Serve, MD Patient Care Team: Blanchie Serve, MD as PCP - General (Internal Medicine) Mast, Man X, NP as Nurse Practitioner (Internal Medicine)  Extended Emergency Contact Information Primary Emergency Contact: [facility] Address: [address]          [place], [place] [address] Johnnette Litter of [place] Phone: [phone] Mobile Phone: [phone] Relation: Son   Goals of Care: Advanced Directive information Advanced Directives 06/18/2017  Does Patient Have a Medical Advance Directive? Yes  Type of Paramedic of [place];Living will  Does patient want to make changes to medical advance directive? No - Patient declined  Copy of [place] in Chart? Yes      Chief Complaint  Patient presents with  . Medical Management of Chronic Issues    1 month follow up.   . Medication Management    Patient stated that she's flying to [place] in a couple of weeks and wants to know if you would be ok to for her to take some anxiety meds if needed  . Labs Only    Discuss labs  . Medication Refill    Lorazepam refill (pending orders)    HPI: Patient is a 83 y.o. female seen today for follow up of anemia.   Anemia- has low iron store on lab work and now has been started on iron supplement. Initially has some nausea but no more. Tolerating the medication well. Denies constipation. Has black colored stool but no frank blood.   Anxiety- controlled. Currently on lorazepam 0.5 mg daily as needed for anxiety. She has upcoming trip to [place] and is concerned about needing to use it if she gets anxious.   Heartburn-  Denies any, tolerating omeprazole at present   Past Medical History:  Diagnosis Date  . GERD (gastroesophageal reflux disease)   . Hx of cataract surgery    both eyes  . Hyperglycemia   .  Hyperlipidemia   . Hypertension   . Lumbago 04/05/2016  . Macular degeneration    ? which eye(s)  . Obstructive sleep apnea   . Osteoporosis, senile   . Overactive bladder   . Squamous cell skin cancer 2/16   left lower leg   Past Surgical History:  Procedure Laterality Date  . ABDOMINAL HYSTERECTOMY  1960's  . APPENDECTOMY  1960's  . MOLE REMOVAL  2000   facial  . TONSILLECTOMY      reports that she has never smoked. She has never used smokeless tobacco. She reports that she drinks alcohol. She reports that she does not use drugs. Social History   Social History  . Marital status: Widowed    Spouse name: N/A  . Number of children: N/A  . 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 Smoking status: Never Smoker  . Smokeless tobacco: Never Used  . Alcohol use Yes     Comment: occassionally 1/2 glass of wine 1-2 x month  . Drug use: No  . Sexual activity: No   Other Topics Concern  . Not on file   Social History Narrative   Lives at [facility] since 11/12/15   Widow   Never smoked   Alcohol occasionally 1/2 glass of wine   Exercise none  POA, Living Will   Walks with walker       Functional Status Survey:    Family History  Problem Relation Age of Onset  . Heart disease Mother   . Heart disease Father     Health Maintenance  Topic Date Due  . INFLUENZA VACCINE  09/28/2017 (Originally 07/29/2017)  . DEXA SCAN  08/04/2018 (Originally 01/23/1991)  . TETANUS/TDAP  12/14/2022  . PNA vac Low Risk Adult  Completed    Allergies  Allergen Reactions  . Adhesive [Tape]   . Ampicillin   . Nitrofuran Derivatives   . Sulfur   . Zoloft [Sertraline Hcl] Other (See Comments)    Dizziness, nausea, faint feeling, and nightmares      Outpatient Encounter Prescriptions as of 08/04/2017  Medication Sig  . acetaminophen (TYLENOL) 325 MG tablet Take 650 mg by mouth daily. She also takes 650 mg  by mouth as needed when she has pain  . aspirin EC 81 MG tablet Take 1 tablet (81 mg total) by mouth daily.  . Calcium Carb-Cholecalciferol (CALCIUM 600+D3) 600-800 MG-UNIT TABS Take by mouth. Take one daily  . ferrous sulfate 325 (65 FE) MG tablet Take 1 tablet (325 mg total) by mouth 2 (two) times daily.  Marland Kitchen loratadine (CLARITIN) 10 MG tablet Take 1 tablet (10 mg total) by mouth daily as needed for allergies or rhinitis.  Marland Kitchen LORazepam (ATIVAN) 0.5 MG tablet Take 1 tablet (0.5 mg total) by mouth daily as needed for anxiety.  Marland Kitchen losartan-hydrochlorothiazide (HYZAAR) 100-12.5 MG tablet Take 1 tablet by mouth daily.  . Menthol, Topical Analgesic, (BIOFREEZE EX) Apply 1 application topically as needed.  . Multiple Vitamins-Minerals (ICAPS) CAPS Take by mouth. Take one I-Cap daily for eyes  . Omega-3 Fatty Acids (FISH OIL) 1200 MG CAPS Take by mouth. Take one daily  . omeprazole (PRILOSEC) 40 MG capsule TAKE 1 CAPSULE DAILY 1/2 HOUR BEFORE BREAKFAST FOR STOMACH ACID.  Marland Kitchen oxybutynin (DITROPAN) 5 MG tablet Take one tablet at bedtime for bladder  . Polyethyl Glycol-Propyl Glycol (SYSTANE) 0.4-0.3 % GEL ophthalmic gel Place 1 application into both eyes 3 (three) times daily.   . traMADol (ULTRAM) 50 MG tablet Take 1 tablet (50 mg total) by mouth every 6 (six) hours as needed for severe pain. Pt has this medication at home- no new supply provided.  . [DISCONTINUED] LORazepam (ATIVAN) 0.5 MG tablet Take 0.5 mg by mouth daily as needed for anxiety.   . [DISCONTINUED] aspirin EC 81 MG tablet Take 81 mg by mouth daily.   No facility-administered encounter medications on file as of 08/04/2017.     Review of Systems  Constitutional: Negative for appetite change, chills and fever.       Has occasional hot flashes. Has intentional weight loss.   HENT: Negative for congestion, mouth sores, postnasal drip, sore throat and trouble swallowing.   Eyes: Positive for visual disturbance.       Corrective glasses. Has had  cataracts removed. Has early stages of macular degeneration and follows with ophthalmology.   Respiratory: Positive for shortness of breath. Negative for cough.        With exertion.   Cardiovascular: Positive for leg swelling. Negative for chest pain and palpitations.       Increased swelling towards end of the day  Gastrointestinal: Positive for constipation. Negative for abdominal pain, blood in stool, diarrhea, nausea, rectal pain and vomiting.       Prunes help, not exacerbated by iron  Genitourinary: Positive for  frequency. Negative for dysuria, hematuria and pelvic pain.  Musculoskeletal: Positive for back pain.       Chronic back pain radiating to her legs, uses a rolling walker with seat  Skin: Negative for rash.  Neurological: Positive for numbness. Negative for dizziness and headaches.       Numbness and tingling to her legs has been chronic  Psychiatric/Behavioral: Negative for behavioral problems and sleep disturbance. The patient is nervous/anxious.     Vitals:   08/04/17 0826  BP: 124/68  Pulse: 74  Resp: 18  Temp: 98.9 F (37.2 C)  TempSrc: Oral  SpO2: 94%  Height: 5\' 5"  (1.651 m)   There is no height or weight on file to calculate BMI.   Wt Readings from Last 3 Encounters:  07/07/17 178 lb 3.2 oz (80.8 kg)  06/18/17 184 lb 12.8 oz (83.8 kg)  05/28/17 185 lb 12.8 oz (84.3 kg)   Has not been weighed today as no weighing scale available  Physical Exam  Constitutional: She is oriented to person, place, and time. No distress.  Overweight, elderly  HENT:  Head: Normocephalic and atraumatic.  Mouth/Throat: Oropharynx is clear and moist.  Eyes: Pupils are equal, round, and reactive to light. Conjunctivae and EOM are normal.  Has glasses  Neck: Neck supple.  Cardiovascular: Normal rate and regular rhythm.   Pulmonary/Chest: Effort normal.  Abdominal: Soft. Bowel sounds are normal.  Musculoskeletal: Normal range of motion. She exhibits edema.  Can move all 4  extremities, uses walker with seat, trace leg edema  Lymphadenopathy:    She has no cervical adenopathy.  Neurological: She is alert and oriented to person, place, and time.  Skin: Skin is warm and dry. She is not diaphoretic.  Psychiatric: She has a normal mood and affect. Her behavior is normal.    Labs reviewed: Basic Metabolic Panel:  Recent Labs  01/07/17 0001 03/09/17 0001 07/06/17 1542  NA 140 140 140  K 3.9 3.8 3.6  CL 104 104 104  CO2 25 26 27   GLUCOSE 99 105* 99  BUN 26* 26* 23  CREATININE 0.81 0.82 0.86  CALCIUM 9.4 10.0 9.4   Liver Function Tests:  Recent Labs  01/07/17 0001 03/09/17 0001 07/06/17 1542  AST 23 26 20   ALT 15 19 13   ALKPHOS 71 72 59  BILITOT 0.4 0.4 0.5  PROT 6.1 6.1 5.7*  ALBUMIN 3.7 4.1 3.8    Recent Labs  03/09/17 0001  LIPASE 7  AMYLASE 41   No results for input(s): AMMONIA in the last 8760 hours. CBC:  Recent Labs  01/07/17 0001 07/06/17 1542 07/20/17 0819  WBC 6.6 7.3 6.5  HGB 11.9 10.9* 10.9*  HCT 36.6 31.8* 33.7*  MCV 91.5 83.7 88.0  PLT 229 205 219   Cardiac Enzymes: No results for input(s): CKTOTAL, CKMB, CKMBINDEX, TROPONINI in the last 8760 hours. BNP: Invalid input(s): POCBNP Lab Results  Component Value Date   HGBA1C 5.7 03/18/2016   Lab Results  Component Value Date   TSH 3.40 01/07/2017   Lab Results  Component Value Date   VITAMINB12 420 07/20/2017   Lab Results  Component Value Date   FOLATE 13.5 07/20/2017   Lab Results  Component Value Date   FERRITIN 10 (L) 07/20/2017    Lipid Panel:  Recent Labs  01/07/17 0001  CHOL 189  HDL 57  LDLCALC 120*  TRIG 58  CHOLHDL 3.3   Lab Results  Component Value Date   HGBA1C 5.7 03/18/2016  Procedures since last visit: No results found.  Assessment/Plan  1. Iron deficiency anemia, unspecified iron deficiency anemia type With low ferritin level, started on ferrous sulfate 325 mg bid for now. Continue this. Check cbc and ferritin  level in 10 weeks and assess further.   2. Gastroesophageal reflux disease without esophagitis Continue omeprazole, tolerating it well  3. Other specified anxiety disorders Provide script for lorazepam 0.5 mg daily as needed to help with anxiety during her trip, common side effects explained.   Labs/tests ordered:  Cbc, ferritin in 10 weeks  Next appointment: 3 months  Communication: reviewed care plan with pt    I spent 30 minutes in total face-to-face time with the patient, more than 50% of which was spent in counseling and coordination of care, reviewing test results, reviewing medication and discussing or reviewing the diagnosis with patient.      Blanchie Serve, MD Internal Medicine [facility] [address] [place], [place] [address] Cell Phone (Monday-Friday 8 am - 5 pm): [phone] On Call: [phone] and follow prompts after 5 pm and on weekends Office Phone: [phone] Office Fax: [phone]

## 2017-08-11 ENCOUNTER — Telehealth: Payer: Self-pay

## 2017-08-11 ENCOUNTER — Non-Acute Institutional Stay: Payer: PPO | Admitting: Internal Medicine

## 2017-08-11 VITALS — BP 126/62 | HR 85 | Temp 98.7°F | Resp 18 | Ht 65.5 in | Wt 180.6 lb

## 2017-08-11 DIAGNOSIS — F418 Other specified anxiety disorders: Secondary | ICD-10-CM | POA: Diagnosis not present

## 2017-08-11 DIAGNOSIS — F419 Anxiety disorder, unspecified: Secondary | ICD-10-CM | POA: Insufficient documentation

## 2017-08-11 MED ORDER — LORAZEPAM 0.5 MG PO TABS: 0.5000 mg | ORAL_TABLET | Freq: Every day | ORAL | 0 refills | Status: DC | PRN

## 2017-08-11 NOTE — Telephone Encounter (Signed)
Patient also called the office and stated that she saw Dr. Bubba Camp and [place] in the [place] room and explained that she needs an extra #8 pills of Lorazepam because she is staying alittle longer than expected. Would like this sent to [facility] for Delivery.

## 2017-08-11 NOTE — Telephone Encounter (Signed)
Per [facility] the patient saw her and Ivin Booty in the dining room. Me and Dr. Bubba Camp [facility] at [facility] [place] yesterday. But we are seeing her today in clinic

## 2017-08-11 NOTE — Telephone Encounter (Signed)
Addressed.

## 2017-08-11 NOTE — Telephone Encounter (Signed)
Holly with [facility] called indicating patient seen Dr.Pandey yesterday in the hallway at [facility] and they discussed patient getting a higher dispense number for Lorazepam.   RX was never called in, please follow-up with [facility].

## 2017-08-11 NOTE — Progress Notes (Signed)
[facility]  Provider: Blanchie Serve MD   Location:  [place] of Service:  Clinic (12)  PCP: Blanchie Serve, MD Patient Care Team: Blanchie Serve, MD as PCP - General (Internal Medicine) Mast, Man X, NP as Nurse Practitioner (Internal Medicine)  Extended Emergency Contact Information Primary Emergency Contact: [facility] Address: [address]          [place], [place] [address] Johnnette Litter of [place] Phone: [phone] Mobile Phone: [phone] Relation: Son   Goals of Care: Advanced Directive information Advanced Directives 06/18/2017  Does Patient Have a Medical Advance Directive? Yes  Type of Paramedic of [place];Living will  Does patient want to make changes to medical advance directive? No - Patient declined  Copy of [place] in Chart? Yes      Chief Complaint  Patient presents with  . Acute Visit    Lorazepam refill     HPI: Patient is a 83 y.o. female seen today for her anxiety. She is having an upcoming trip. She feels she will need lorazepam to help with her anxiety during the trip. She has extended her trip from 2 weeks to 3 weeks now and last visit I had provided her with a script for lorazepam 15 pills only. She has not used any of it.    Past Medical History:  Diagnosis Date  . GERD (gastroesophageal reflux disease)   . Hx of cataract surgery    both eyes  . Hyperglycemia   . Hyperlipidemia   . Hypertension   . Lumbago 04/05/2016  . Macular degeneration    ? which eye(s)  . Obstructive sleep apnea   . Osteoporosis, senile   . Overactive bladder   . Squamous cell skin cancer 2/16   left lower leg   Past Surgical History:  Procedure Laterality Date  . ABDOMINAL HYSTERECTOMY  1960's  . APPENDECTOMY  1960's  . MOLE REMOVAL  2000   facial  . TONSILLECTOMY      reports that she has never smoked. She has never used smokeless tobacco. She reports that she  drinks alcohol. She reports that she does not use drugs. Social History   Social History  . Marital status: Widowed    Spouse name: N/A  . Number of children: N/A  . 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 Smoking status: Never Smoker  . Smokeless tobacco: Never Used  . Alcohol use Yes     Comment: occassionally 1/2 glass of wine 1-2 x month  . Drug use: No  . Sexual activity: No   Other Topics Concern  . Not on file   Social History Narrative   Lives at [facility] since 11/12/15   Widow   Never smoked   Alcohol occasionally 1/2 glass of wine   Exercise none   POA, Living Will   Walks with walker       Functional Status Survey:    Family History  Problem Relation Age of Onset  . Heart disease Mother   . Heart disease Father     Health Maintenance  Topic Date Due  . INFLUENZA VACCINE  09/28/2017 (Originally 07/29/2017)  . DEXA SCAN  08/04/2018 (Originally 01/23/1991)  . TETANUS/TDAP  12/14/2022  . PNA vac Low Risk Adult  Completed    Allergies  Allergen Reactions  . Adhesive [Tape]   .  Ampicillin   . Nitrofuran Derivatives   . Sulfur   . Zoloft [Sertraline Hcl] Other (See Comments)    Dizziness, nausea, faint feeling, and nightmares      Outpatient Encounter Prescriptions as of 08/11/2017  Medication Sig  . acetaminophen (TYLENOL) 325 MG tablet Take 650 mg by mouth daily. She also takes 650 mg by mouth as needed when she has pain  . aspirin EC 81 MG tablet Take 1 tablet (81 mg total) by mouth daily.  . Calcium Carb-Cholecalciferol (CALCIUM 600+D3) 600-800 MG-UNIT TABS Take by mouth. Take one daily  . ferrous sulfate 325 (65 FE) MG tablet Take 1 tablet (325 mg total) by mouth 2 (two) times daily.  Marland Kitchen loratadine (CLARITIN) 10 MG tablet Take 1 tablet (10 mg total) by mouth daily as needed for allergies or rhinitis.  Marland Kitchen LORazepam (ATIVAN) 0.5 MG tablet Take 1 tablet (0.5 mg total)  by mouth daily as needed for anxiety.  Marland Kitchen losartan-hydrochlorothiazide (HYZAAR) 100-12.5 MG tablet Take 1 tablet by mouth daily.  . Menthol, Topical Analgesic, (BIOFREEZE EX) Apply 1 application topically as needed.  . Multiple Vitamins-Minerals (ICAPS) CAPS Take by mouth. Take one I-Cap daily for eyes  . Omega-3 Fatty Acids (FISH OIL) 1200 MG CAPS Take by mouth. Take one daily  . omeprazole (PRILOSEC) 40 MG capsule TAKE 1 CAPSULE DAILY 1/2 HOUR BEFORE BREAKFAST FOR STOMACH ACID.  Marland Kitchen oxybutynin (DITROPAN) 5 MG tablet Take one tablet at bedtime for bladder  . Polyethyl Glycol-Propyl Glycol (SYSTANE) 0.4-0.3 % GEL ophthalmic gel Place 1 application into both eyes 3 (three) times daily.   . traMADol (ULTRAM) 50 MG tablet Take 1 tablet (50 mg total) by mouth every 6 (six) hours as needed for severe pain. Pt has this medication at home- no new supply provided.   No facility-administered encounter medications on file as of 08/11/2017.     Review of Systems  Constitutional: Negative for appetite change and fever.  HENT: Negative for congestion, sore throat and trouble swallowing.   Eyes: Positive for visual disturbance.       Corrective glasses. Has had cataracts removed. Has early stages of macular degeneration and follows with ophthalmology.   Respiratory: Positive for shortness of breath. Negative for cough.        With exertion.   Cardiovascular: Positive for leg swelling. Negative for chest pain.       Ted hose has been helping.    Gastrointestinal: Negative for abdominal pain and vomiting.       Prunes help, not exacerbated by iron  Genitourinary: Negative for dysuria.  Musculoskeletal: Positive for back pain.       Chronic back pain radiating to her legs, uses a rolling walker with seat  Neurological: Positive for numbness. Negative for dizziness and headaches.       Numbness and tingling to her legs has been chronic  Psychiatric/Behavioral: Negative for sleep disturbance. The patient is  nervous/anxious.     Vitals:   08/11/17 1054  BP: 126/62  Pulse: 85  Resp: 18  Temp: 98.7 F (37.1 C)  TempSrc: Oral  SpO2: 93%  Weight: 180 lb 9.6 oz (81.9 kg)  Height: 5' 5.5" (1.664 m)   Body mass index is 29.6 kg/m.   Wt Readings from Last 3 Encounters:  08/11/17 180 lb 9.6 oz (81.9 kg)  07/07/17 178 lb 3.2 oz (80.8 kg)  06/18/17 184 lb 12.8 oz (83.8 kg)    Physical Exam  Constitutional: She is oriented to person, place,  and time.  Overweight, elderly  HENT:  Head: Normocephalic and atraumatic.  Mouth/Throat: Oropharynx is clear and moist.  Eyes: Pupils are equal, round, and reactive to light. Conjunctivae and EOM are normal.  Has glasses  Neck: Neck supple.  Cardiovascular: Normal rate and regular rhythm.   Pulmonary/Chest: Effort normal and breath sounds normal.  Musculoskeletal: Normal range of motion. She exhibits edema.  Can move all 4 extremities, uses walker with seat, trace leg edema  Lymphadenopathy:    She has no cervical adenopathy.  Neurological: She is alert and oriented to person, place, and time.  Skin: Skin is warm and dry. She is not diaphoretic.  Psychiatric:  Somewhat anxious this visit    Labs reviewed: Basic Metabolic Panel:  Recent Labs  01/07/17 0001 03/09/17 0001 07/06/17 1542  NA 140 140 140  K 3.9 3.8 3.6  CL 104 104 104  CO2 25 26 27   GLUCOSE 99 105* 99  BUN 26* 26* 23  CREATININE 0.81 0.82 0.86  CALCIUM 9.4 10.0 9.4   Liver Function Tests:  Recent Labs  01/07/17 0001 03/09/17 0001 07/06/17 1542  AST 23 26 20   ALT 15 19 13   ALKPHOS 71 72 59  BILITOT 0.4 0.4 0.5  PROT 6.1 6.1 5.7*  ALBUMIN 3.7 4.1 3.8    Recent Labs  03/09/17 0001  LIPASE 7  AMYLASE 41   No results for input(s): AMMONIA in the last 8760 hours. CBC:  Recent Labs  01/07/17 0001 07/06/17 1542 07/20/17 0819  WBC 6.6 7.3 6.5  HGB 11.9 10.9* 10.9*  HCT 36.6 31.8* 33.7*  MCV 91.5 83.7 88.0  PLT 229 205 219   Cardiac Enzymes: No  results for input(s): CKTOTAL, CKMB, CKMBINDEX, TROPONINI in the last 8760 hours. BNP: Invalid input(s): POCBNP Lab Results  Component Value Date   HGBA1C 5.7 03/18/2016   Lab Results  Component Value Date   TSH 3.40 01/07/2017   Lab Results  Component Value Date   VITAMINB12 420 07/20/2017   Lab Results  Component Value Date   FOLATE 13.5 07/20/2017   Lab Results  Component Value Date   FERRITIN 10 (L) 07/20/2017    Lipid Panel:  Recent Labs  01/07/17 0001  CHOL 189  HDL 57  LDLCALC 120*  TRIG 58  CHOLHDL 3.3   Lab Results  Component Value Date   HGBA1C 5.7 03/18/2016    Procedures since last visit: No results found.  Assessment/Plan  1. Other specified anxiety disorders Provide script for lorazepam 0.5 mg daily as needed to help with anxiety during her trip, common side effects explained. 2 more weeks of supply provided.   I spent 15 minutes in total face-to-face time with the patient, more than 50% of which was spent in counseling and coordination of care, reviewing test results, reviewing medication and discussing or reviewing the diagnosis with patient.      Blanchie Serve, MD Internal Medicine [facility] [address] [place], [place] [address] Cell Phone (Monday-Friday 8 am - 5 pm): [phone] On Call: [phone] and follow prompts after 5 pm and on weekends Office Phone: [phone] Office Fax: [phone]

## 2017-09-03 ENCOUNTER — Telehealth: Payer: Self-pay

## 2017-09-03 NOTE — Telephone Encounter (Signed)
Message left on clinical intake voicemail:   Patient states " I am calling for Jean [name], Dr.Pandey's assistant."  Patient traveled to [place] a few months ago and the airlines lost her CPAP machine, another machine was ordered via phone.   Patient received new CPAP machine and its a different brand than the previous one. Patient needs orders from PCP sent to [facility] (no number provided) for Standard Pressure settings for Alex CPAP machine   Please called patient with a status update when this is complete or if you have additional questions or concerns

## 2017-09-04 NOTE — Telephone Encounter (Signed)
I don't see any sleep study result in Epic. There is a scanned form from [place] under date 01/19/17 with a faxed document from [facility] on 04/16/17 mentioning about CPAP with no specific setting. Please contact [facility] to get further info on settings that patient was on before she lost the device. Once we get that information, we can put in a new order and send it to [facility].

## 2017-09-08 NOTE — Telephone Encounter (Signed)
Spoke with [facility] and they stated that the settings for the cpap machine are 11.6 cmH20. They also stated that they would fax over a copy to the facility for Dr. Bubba Camp to look at.

## 2017-09-08 NOTE — Telephone Encounter (Signed)
09/04/17. Called [facility] and was unable to reach anyone and left a voicemail leaving [facility] office number

## 2017-09-09 NOTE — Telephone Encounter (Signed)
Received the paper that [facility] faxed over. Unfortunately the papers didn't have anything regarding the setting for the cpap machine. Per Dr. Bubba Camp call [facility] to let them know that the information they sent didn't have what we were looking for.   Spoke with [facility] and they were able to give me the name of her doctor that ordered the cpap machine.

## 2017-09-11 NOTE — Telephone Encounter (Signed)
Geanie Logan RN called the clinic to ask if we had the cpap settings. I told her yes and that I was going to fax them to [facility]. Tiffany then stated the Mrs. Wickstrom told her is was [facility].   I called Mrs. Saulnier and she stated that the setting are to be sent to [facility] who is the respiratory therapist at [facility].   I spoke with Ailene Ravel at [facility] and she stated that she need a script for setting to be 11 or 12 because that's what shows on her download from the previous machine. She stated that  Mrs. Escalona told her that she has it set at 4.

## 2017-09-29 DIAGNOSIS — R32 Unspecified urinary incontinence: Secondary | ICD-10-CM | POA: Diagnosis not present

## 2017-09-29 DIAGNOSIS — M545 Low back pain: Secondary | ICD-10-CM | POA: Diagnosis not present

## 2017-09-29 DIAGNOSIS — R29898 Other symptoms and signs involving the musculoskeletal system: Secondary | ICD-10-CM | POA: Diagnosis not present

## 2017-09-29 DIAGNOSIS — M6281 Muscle weakness (generalized): Secondary | ICD-10-CM | POA: Diagnosis not present

## 2017-09-30 ENCOUNTER — Other Ambulatory Visit: Payer: Self-pay | Admitting: Internal Medicine

## 2017-10-13 DIAGNOSIS — D509 Iron deficiency anemia, unspecified: Secondary | ICD-10-CM | POA: Diagnosis not present

## 2017-10-13 LAB — CBC AND DIFFERENTIAL
HEMATOCRIT: 36 (ref 36–46)
HEMOGLOBIN: 12.3 (ref 12.0–16.0)
PLATELETS: 180 (ref 150–399)
WBC: 6.2

## 2017-10-20 ENCOUNTER — Other Ambulatory Visit: Payer: Self-pay | Admitting: *Deleted

## 2017-10-22 DIAGNOSIS — G4733 Obstructive sleep apnea (adult) (pediatric): Secondary | ICD-10-CM | POA: Diagnosis not present

## 2017-10-26 ENCOUNTER — Encounter: Payer: Self-pay | Admitting: Internal Medicine

## 2017-10-26 DIAGNOSIS — L821 Other seborrheic keratosis: Secondary | ICD-10-CM | POA: Diagnosis not present

## 2017-10-26 DIAGNOSIS — L918 Other hypertrophic disorders of the skin: Secondary | ICD-10-CM | POA: Diagnosis not present

## 2017-10-26 DIAGNOSIS — D224 Melanocytic nevi of scalp and neck: Secondary | ICD-10-CM | POA: Diagnosis not present

## 2017-10-26 DIAGNOSIS — D485 Neoplasm of uncertain behavior of skin: Secondary | ICD-10-CM | POA: Diagnosis not present

## 2017-10-28 DIAGNOSIS — N8110 Cystocele, unspecified: Secondary | ICD-10-CM | POA: Diagnosis not present

## 2017-11-03 ENCOUNTER — Encounter: Payer: Self-pay | Admitting: Internal Medicine

## 2017-11-03 ENCOUNTER — Non-Acute Institutional Stay: Payer: PPO | Admitting: Internal Medicine

## 2017-11-03 VITALS — BP 126/70 | HR 78 | Temp 99.1°F | Resp 16 | Ht 66.0 in | Wt 188.0 lb

## 2017-11-03 DIAGNOSIS — N3281 Overactive bladder: Secondary | ICD-10-CM | POA: Diagnosis not present

## 2017-11-03 DIAGNOSIS — G8929 Other chronic pain: Secondary | ICD-10-CM | POA: Diagnosis not present

## 2017-11-03 DIAGNOSIS — M5440 Lumbago with sciatica, unspecified side: Secondary | ICD-10-CM

## 2017-11-03 DIAGNOSIS — I10 Essential (primary) hypertension: Secondary | ICD-10-CM | POA: Diagnosis not present

## 2017-11-03 DIAGNOSIS — E669 Obesity, unspecified: Secondary | ICD-10-CM | POA: Diagnosis not present

## 2017-11-03 DIAGNOSIS — F419 Anxiety disorder, unspecified: Secondary | ICD-10-CM

## 2017-11-03 DIAGNOSIS — D509 Iron deficiency anemia, unspecified: Secondary | ICD-10-CM

## 2017-11-03 DIAGNOSIS — M5442 Lumbago with sciatica, left side: Secondary | ICD-10-CM

## 2017-11-03 DIAGNOSIS — J309 Allergic rhinitis, unspecified: Secondary | ICD-10-CM

## 2017-11-03 DIAGNOSIS — K219 Gastro-esophageal reflux disease without esophagitis: Secondary | ICD-10-CM

## 2017-11-03 MED ORDER — LORATADINE 10 MG PO TABS: 10.0000 mg | ORAL_TABLET | Freq: Every day | ORAL | 3 refills | Status: DC

## 2017-11-03 MED ORDER — FERROUS SULFATE 325 (65 FE) MG PO TABS: 325.0000 mg | ORAL_TABLET | Freq: Every day | ORAL | 3 refills | Status: DC

## 2017-11-03 MED ORDER — OXYBUTYNIN CHLORIDE ER 10 MG PO TB24: 10.0000 mg | ORAL_TABLET | Freq: Every day | ORAL | 3 refills | Status: DC

## 2017-11-03 NOTE — Patient Instructions (Addendum)
Only take baby aspirin 81 mg daily at bedtime. Do not take any other aspirin.  Resume your iron pill but take it once a day only.  Take tylenol 325 mg 2 tablet twice a day as needed for your leg pain.   Your medication for bladder- oxybutynin has been increased to 10 mg daily from 5 mg daily. Take it as prescribed.   Due to your allergies, I want you to take claritin 10 mg daily for 2 weeks and then once a day as needed.    Obesity, Adult Obesity is having too much body fat. If you have a BMI of 30 or more, you are obese. BMI is a number that explains how much body fat you have. Obesity is often caused by taking in (consuming) more calories than your body uses. Obesity can cause serious health problems. Changing your lifestyle can help to treat obesity. Follow these instructions at home: Eating and drinking   Follow advice from your doctor about what to eat and drink. Your doctor may tell you to: ? Cut down on (limit) fast foods, sweets, and processed snack foods. ? Choose low-fat options. For example, choose low-fat milk instead of whole milk. ? Eat 5 or more servings of fruits or vegetables every day. ? Eat at home more often. This gives you more control over what you eat. ? Choose healthy foods when you eat out. ? Learn what a healthy portion size is. A portion size is the amount of a certain food that is healthy for you to eat at one time. This is different for each person. ? Keep low-fat snacks available. ? Avoid sugary drinks. These include soda, fruit juice, iced tea that is sweetened with sugar, and flavored milk. ? Eat a healthy breakfast.  Drink enough water to keep your pee (urine) clear or pale yellow.  Do not go without eating for long periods of time (do not fast).  Do not go on popular or trendy diets (fad diets). Physical Activity  Exercise often, as told by your doctor. Ask your doctor: ? What types of exercise are safe for you. ? How often you should  exercise.  Warm up and stretch before being active.  Do slow stretching after being active (cool down).  Rest between times of being active. Lifestyle  Limit how much time you spend in front of your TV, computer, or video game system (be less sedentary).  Find ways to reward yourself that do not involve food.  Limit alcohol intake to no more than 1 drink a day for nonpregnant women and 2 drinks a day for men. One drink equals 12 oz of beer, 5 oz of wine, or 1 oz of hard liquor. General instructions  Keep a weight loss journal. This can help you keep track of: ? The food that you eat. ? The exercise that you do.  Take over-the-counter and prescription medicines only as told by your doctor.  Take vitamins and supplements only as told by your doctor.  Think about joining a support group. Your doctor may be able to help with this.  Keep all follow-up visits as told by your doctor. This is important. Contact a doctor if:  You cannot meet your weight loss goal after you have changed your diet and lifestyle for 6 weeks. This information is not intended to replace advice given to you by your health care provider. Make sure you discuss any questions you have with your health care provider. Document Released:  03/08/2012 Document Revised: 05/22/2016 Document Reviewed: 10/03/2015 Elsevier Interactive Patient Education  [employer].

## 2017-11-03 NOTE — Progress Notes (Signed)
[facility]  Provider: Blanchie Serve MD   Location:  [place] of Service:  Clinic (12)  PCP: Blanchie Serve, MD Patient Care Team: Blanchie Serve, MD as PCP - General (Internal Medicine) Mast, Man X, NP as Nurse Practitioner (Internal Medicine)  Extended Emergency Contact Information Primary Emergency Contact: [facility] Address: [address]          [place], [place] [address] Johnnette Litter of [place] Phone: [phone] Mobile Phone: [phone] Relation: Son  Goals of Care: Advanced Directive information Advanced Directives 06/18/2017  Does Patient Have a Medical Advance Directive? Yes  Type of Paramedic of [place];Living will  Does patient want to make changes to medical advance directive? No - Patient declined  Copy of [place] in Chart? Yes      Chief Complaint  Patient presents with  . Medical Management of Chronic Issues    3 month follow up. Patient stated that at random she can start to tremble out of nowhere. Patient stated that she has started physical therapy. Patient coughing during questions. Patient stated that the coughing started this morning.    . Medication Refill    No refills needed at this time.     HPI: Patient is a 83 y.o. female seen today for routine visit. She is uptodate with her influenza immunization  Iron def anemia- was started on iron bid in 07/2017. Ran out of medication and has not taken it for few weeks. Energy level is poor and gets tired easily. No blood in stool.   GERD- currently on omeprazole and remains symptom free for most days. Denies nausea or vomiting.   OAB- chronic, persists, currently on oxybutynin 5 mg daily.    HTN- currently on losartan-hctz 100-12.5 mg daily. Occasional dizziness and shakiness. No fall reported. Currently taking baby aspirin and full strength aspirin.   Lumbago- on prn tramadol. Uses back massager for half  an hour a day with some help. Has been taking aspirin 325 mg 1-2 times a day as needed for pain. Rarely requires tramadol.   GAD- has rarely required to use her ativan. Nerves overall calm.   Past Medical History:  Diagnosis Date  . GERD (gastroesophageal reflux disease)   . Hx of cataract surgery    both eyes  . Hyperglycemia   . Hyperlipidemia   . Hypertension   . Lumbago 04/05/2016  . Macular degeneration    ? which eye(s)  . Obstructive sleep apnea   . Osteoporosis, senile   . Overactive bladder   . Squamous cell skin cancer 2/16   left lower leg   Past Surgical History:  Procedure Laterality Date  . ABDOMINAL HYSTERECTOMY  1960's  . APPENDECTOMY  1960's  . MOLE REMOVAL  2000   facial  . TONSILLECTOMY      reports that  has never smoked. she has never used smokeless tobacco. She reports that she drinks alcohol. She reports that she does not use drugs. Social History   Socioeconomic History  . Marital status: Widowed    Spouse name: Not on file  . Number of children: Not on file  . Years of education: Not on file  . Highest education level: Not on file  Social Needs  . Financial resource strain: Not on file  . Food insecurity - worry: Not on file  . Food insecurity - inability: Not on file  . Transportation needs - medical: Not on file  .  Transportation needs - non-medical: Not on file  Occupational History  . 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 Smoking status: Never Smoker  . Smokeless tobacco: Never Used  Substance and Sexual Activity  . Alcohol use: Yes    Comment: occassionally 1/2 glass of wine 1-2 x month  . Drug use: No  . Sexual activity: No  Other Topics Concern  . Not on file  Social History Narrative   Lives at [facility] since 11/12/15   Widow   Never smoked   Alcohol occasionally 1/2 glass of wine   Exercise none   POA, Living Will   Walks with walker    Functional Status Survey:    Family History  Problem  Relation Age of Onset  . Heart disease Mother   . Heart disease Father     Health Maintenance  Topic Date Due  . DEXA SCAN  08/04/2018 (Originally 01/23/1991)  . TETANUS/TDAP  12/14/2022  . INFLUENZA VACCINE  Completed  . PNA vac Low Risk Adult  Completed    Allergies  Allergen Reactions  . Adhesive [Tape]   . Ampicillin   . Nitrofuran Derivatives   . Sulfur   . Zoloft [Sertraline Hcl] Other (See Comments)    Dizziness, nausea, faint feeling, and nightmares      Outpatient Encounter Medications as of 11/03/2017  Medication Sig  . aspirin EC 81 MG tablet Take 1 tablet (81 mg total) by mouth daily.  . Calcium Carb-Cholecalciferol (CALCIUM 600+D3) 600-800 MG-UNIT TABS Take by mouth. Take one daily  . LORazepam (ATIVAN) 0.5 MG tablet Take 1 tablet (0.5 mg total) by mouth daily as needed for anxiety. 30 pills in total- 15 last visit and 15 this visit  . losartan-hydrochlorothiazide (HYZAAR) 100-12.5 MG tablet Take 1 tablet by mouth daily.  . Menthol, Topical Analgesic, (BIOFREEZE EX) Apply 1 application topically as needed.  . Multiple Vitamins-Minerals (ICAPS) CAPS Take by mouth. Take one I-Cap daily for eyes  . Omega-3 Fatty Acids (FISH OIL) 1200 MG CAPS Take by mouth. Take one daily  . omeprazole (PRILOSEC) 40 MG capsule TAKE 1 CAPSULE DAILY 1/2 HOUR BEFORE BREAKFAST FOR STOMACH ACID.  Marland Kitchen Polyethyl Glycol-Propyl Glycol (SYSTANE) 0.4-0.3 % GEL ophthalmic gel Place 1 application into both eyes 3 (three) times daily.   . traMADol (ULTRAM) 50 MG tablet Take 1 tablet (50 mg total) by mouth every 6 (six) hours as needed for severe pain. Pt has this medication at home- no new supply provided.  . [DISCONTINUED] loratadine (CLARITIN) 10 MG tablet Take 1 tablet (10 mg total) by mouth daily as needed for allergies or rhinitis.  . [DISCONTINUED] oxybutynin (DITROPAN) 5 MG tablet Take one tablet at bedtime for bladder  . acetaminophen (TYLENOL) 325 MG tablet Take 650 mg 2 (two) times daily as  needed by mouth.  . ferrous sulfate 325 (65 FE) MG tablet Take 1 tablet (325 mg total) daily with breakfast by mouth.  . loratadine (CLARITIN) 10 MG tablet Take 1 tablet (10 mg total) daily by mouth. For 2 weeks, then daily as needed only for nasal congestion. Allergies.  Marland Kitchen oxybutynin (DITROPAN XL) 10 MG 24 hr tablet Take 1 tablet (10 mg total) at bedtime by mouth.  . [DISCONTINUED] ferrous sulfate 325 (65 FE) MG tablet Take 1 tablet (325 mg total) by mouth 2 (two) times daily. (Patient not taking: Reported on 11/03/2017)   No facility-administered encounter medications on file as of 11/03/2017.     Review of  Systems  Constitutional: Negative for appetite change, chills and fever.  HENT: Positive for congestion and hearing loss. Negative for mouth sores, rhinorrhea, sore throat and trouble swallowing.   Eyes: Positive for visual disturbance.       Wears glasses.   Respiratory: Positive for cough. Negative for shortness of breath.        Some cough started this am with need to clear her throat.   Cardiovascular: Negative for chest pain, palpitations and leg swelling.  Gastrointestinal: Negative for abdominal pain, constipation, diarrhea, nausea and vomiting.       Prune helps  Genitourinary: Negative for dysuria and hematuria.  Musculoskeletal: Positive for arthralgias and gait problem.       Walker, no fall, right leg chronic edema, compression stocking helps  Skin: Negative for rash and wound.  Neurological: Positive for dizziness and numbness. Negative for syncope and weakness.  Psychiatric/Behavioral: Negative for behavioral problems, confusion and sleep disturbance.    Vitals:   11/03/17 1116  BP: 126/70  Pulse: 78  Resp: 16  Temp: 99.1 F (37.3 C)  TempSrc: Oral  SpO2: 95%  Weight: 188 lb (85.3 kg)  Height: '5\' 6"'  (1.676 m)   Body mass index is 30.34 kg/m.   Wt Readings from Last 3 Encounters:  11/03/17 188 lb (85.3 kg)  08/11/17 180 lb 9.6 oz (81.9 kg)  07/07/17 178 lb  3.2 oz (80.8 kg)   Physical Exam  Constitutional: She is oriented to person, place, and time. No distress.  Obese, elderly female  HENT:  Head: Normocephalic and atraumatic.  Mouth/Throat: No oropharyngeal exudate.  Mild oropharyngeal erythema. Swollen nasal turbinates  Eyes: Conjunctivae and EOM are normal. Pupils are equal, round, and reactive to light. Right eye exhibits no discharge. Left eye exhibits no discharge.  Neck: Normal range of motion. Neck supple.  Cardiovascular: Normal rate and regular rhythm.  Pulmonary/Chest: Effort normal and breath sounds normal. She has no wheezes. She has no rales.  Abdominal: Soft. Bowel sounds are normal. She exhibits no distension. There is no guarding.  Musculoskeletal: Normal range of motion.  Can move all 4 extremities, trace leg edema, unsteady gait, arthritis changes to her fingers  Lymphadenopathy:    She has no cervical adenopathy.  Neurological: She is alert and oriented to person, place, and time.  11/03/17 MMSE 29/30, passed clock draw.   Skin: Skin is warm and dry. She is not diaphoretic.  Psychiatric: She has a normal mood and affect.   Depression screen [facility] 2/9 11/03/2017  Decreased Interest 0  Down, Depressed, Hopeless 0  PHQ - 2 Score 0    Labs reviewed: Basic Metabolic Panel: Recent Labs    01/07/17 0001 03/09/17 0001 07/06/17 1542  NA 140 140 140  K 3.9 3.8 3.6  CL 104 104 104  CO2 '25 26 27  ' GLUCOSE 99 105* 99  BUN 26* 26* 23  CREATININE 0.81 0.82 0.86  CALCIUM 9.4 10.0 9.4   Liver Function Tests: Recent Labs    01/07/17 0001 03/09/17 0001 07/06/17 1542  AST '23 26 20  ' ALT '15 19 13  ' ALKPHOS 71 72 59  BILITOT 0.4 0.4 0.5  PROT 6.1 6.1 5.7*  ALBUMIN 3.7 4.1 3.8   Recent Labs    03/09/17 0001  LIPASE 7  AMYLASE 41   No results for input(s): AMMONIA in the last 8760 hours. CBC: Recent Labs    01/07/17 0001 07/06/17 1542 07/20/17 0819 10/13/17  WBC 6.6 7.3 6.5 6.2  HGB 11.9  10.9* 10.9* 12.3    HCT 36.6 31.8* 33.7* 36  MCV 91.5 83.7 88.0  --   PLT 229 205 219 180   Cardiac Enzymes: No results for input(s): CKTOTAL, CKMB, CKMBINDEX, TROPONINI in the last 8760 hours. BNP: Invalid input(s): POCBNP Lab Results  Component Value Date   HGBA1C 5.7 03/18/2016   Lab Results  Component Value Date   TSH 3.40 01/07/2017   Lab Results  Component Value Date   VITAMINB12 420 07/20/2017   Lab Results  Component Value Date   FOLATE 13.5 07/20/2017   Lab Results  Component Value Date   FERRITIN 10 (L) 07/20/2017    Lipid Panel: Recent Labs    01/07/17 0001  CHOL 189  HDL 57  LDLCALC 120*  TRIG 58  CHOLHDL 3.3   Lab Results  Component Value Date   HGBA1C 5.7 03/18/2016    Procedures since last visit: No results found.  Assessment/Plan  1. Iron deficiency anemia, unspecified iron deficiency anemia type - ferrous sulfate 325 (65 FE) MG tablet; Take 1 tablet (325 mg total) daily with breakfast by mouth.  Dispense: 90 tablet; Refill: 3 - CBC (no diff); Future - Ferritin; Future  2. OAB (overactive bladder) Increase oxybutynin from 5 mg daily to 10 mg daily - oxybutynin (DITROPAN XL) 10 MG 24 hr tablet; Take 1 tablet (10 mg total) at bedtime by mouth.  Dispense: 90 tablet; Refill: 3  3. Allergic rhinitis, unspecified seasonality, unspecified trigger Change claritin to daily for now for 2 weeks and then daily as needed - loratadine (CLARITIN) 10 MG tablet; Take 1 tablet (10 mg total) daily by mouth. For 2 weeks, then daily as needed only for nasal congestion. Allergies.  Dispense: 30 tablet; Refill: 3  4. Gastroesophageal reflux disease without esophagitis Continue omeprazole, avoid late night meals, sit upright for 30 min post meal, weight loss encouraged - CMP with eGFR; Future - Lipid Panel; Future  5. Essential hypertension Stable, continue current regimen - CMP with eGFR; Future - TSH; Future  6. Chronic bilateral low back pain with sciatica, sciatica  laterality unspecified Continue prn tramadol and prn tylenol. Advised to stop aspirin 325 OTC.   7. Anxiety On prn ativan, requires it rarely.   8. Obesity (BMI 30-39.9) Weight gained. Encouraged to cut down on sweets and fried food. Check lipid and TSH. advsied on exercising 30 minutes 5 days a week.  - Lipid Panel; Future - TSH; Future   Labs/tests ordered:   Lab Orders     CMP with eGFR     Lipid Panel     CBC (no diff)     Ferritin     TSH  Next appointment: 3 months  Communication: reviewed care plan with patient     Blanchie Serve, MD Internal Medicine [facility] [place], [place] [address] Cell Phone (Monday-Friday 8 am - 5 pm): [phone] On Call: [phone] and follow prompts after 5 pm and on weekends Office Phone: [phone] Office Fax: [phone]

## 2017-11-10 ENCOUNTER — Other Ambulatory Visit: Payer: Self-pay | Admitting: *Deleted

## 2017-11-12 ENCOUNTER — Other Ambulatory Visit: Payer: Self-pay | Admitting: *Deleted

## 2017-11-12 MED ORDER — LORAZEPAM 0.5 MG PO TABS: 0.5000 mg | ORAL_TABLET | Freq: Every day | ORAL | 0 refills | Status: DC | PRN

## 2017-11-12 NOTE — Telephone Encounter (Signed)
Patient requested to fax to pharmacy.  [place] Database Verified. LR 08/12/17

## 2017-11-13 ENCOUNTER — Other Ambulatory Visit: Payer: Self-pay | Admitting: Internal Medicine

## 2017-11-17 ENCOUNTER — Other Ambulatory Visit: Payer: Self-pay

## 2017-11-17 NOTE — Telephone Encounter (Signed)
Left patient a voicemail stating that her script is ready for pick up. I also spoke with Geanie [place] nurse and she stated that she would try to locate Md. Jacolyn Reedy tomorrow while [facility] at [facility] [place]. The script will be on my desk in a locked office.

## 2017-12-23 ENCOUNTER — Other Ambulatory Visit: Payer: Self-pay

## 2017-12-23 NOTE — Patient Outreach (Signed)
[place] [facility]) Care Management  12/23/2017  Santina Trillo September 05, 1924 [id number]   Telephone call to patient for [employer] referral.  No answer.  HIPAA compliant voice message left.  Plan: RN CM will attempt patient within 10 business days.    Jone Baseman, RN, MSN [facility] Care Management Care Management Coordinator Direct Line [phone] Toll Free: [phone]  Fax: [phone]

## 2017-12-30 ENCOUNTER — Other Ambulatory Visit: Payer: Self-pay | Admitting: Internal Medicine

## 2017-12-31 ENCOUNTER — Encounter: Payer: PPO | Admitting: Nurse Practitioner

## 2017-12-31 ENCOUNTER — Encounter: Payer: Self-pay | Admitting: Nurse Practitioner

## 2017-12-31 ENCOUNTER — Other Ambulatory Visit: Payer: Self-pay

## 2017-12-31 NOTE — Patient Outreach (Signed)
[place] [facility]) Care Management  12/31/2017  Jean [name] 04-25-1924 [id number]   Telephone call to patient for [employer] referral follow up.  Spoke with patient and she is able to verify HIPAA.  Patient aware of reason for the call.  She states that right now she does not wish to pursue hearing aids at this time as she has other things to do. Patient working on handicap placard for her car and possible shingles vaccine.  Discussed with patient possible process of achieving those things through her clinic at [facility] and she agrees.  Discussed [facility] Care Management.  Patient declined services at this time.    Plan: RN CM will send letter and brochure and close case. RN CM will notify care management assistant of case status.    Jone Baseman, RN, MSN [facility] Care Management Care Management Coordinator Direct Line [phone] Toll Free: [phone]  Fax: [phone]

## 2018-01-01 ENCOUNTER — Telehealth: Payer: Self-pay | Admitting: *Deleted

## 2018-01-01 NOTE — Telephone Encounter (Signed)
Patient called requesting to speak with [place] regarding DMV paperwork. Stated that [facility] filled out yesterday (note not complete) but it is required to have MD signature and patient wants Dr. Bubba Camp to redo the paperwork. Patient requested an appointment for Tuesday (none available). Would like for [place] to call her back at [phone]

## 2018-01-01 NOTE — Telephone Encounter (Signed)
Spoke with the patient. She brought her paperwork down to the clinic [facility] [place] Dr. Bubba Camp took care of the paperwork.

## 2018-01-04 NOTE — Progress Notes (Signed)
This encounter was created in error - please disregard.

## 2018-01-14 ENCOUNTER — Other Ambulatory Visit: Payer: Self-pay

## 2018-01-25 DIAGNOSIS — I10 Essential (primary) hypertension: Secondary | ICD-10-CM | POA: Diagnosis not present

## 2018-01-25 DIAGNOSIS — E669 Obesity, unspecified: Secondary | ICD-10-CM | POA: Diagnosis not present

## 2018-01-25 DIAGNOSIS — K219 Gastro-esophageal reflux disease without esophagitis: Secondary | ICD-10-CM | POA: Diagnosis not present

## 2018-01-25 DIAGNOSIS — D509 Iron deficiency anemia, unspecified: Secondary | ICD-10-CM | POA: Diagnosis not present

## 2018-01-26 LAB — LIPID PANEL
CHOLESTEROL: 190 mg/dL (ref <200)
HDL: 51 mg/dL (ref >50)
LDL Cholesterol (Calc): 118 mg/dL (calc) — ABNORMAL HIGH
NON-HDL CHOLESTEROL (CALC): 139 mg/dL — AB (ref <130)
TRIGLYCERIDES: 107 mg/dL (ref <150)
Total CHOL/HDL Ratio: 3.7 (calc) (ref <5.0)

## 2018-01-26 LAB — COMPLETE METABOLIC PANEL WITH GFR
AG RATIO: 2 (calc) (ref 1.0–2.5)
ALT: 26 U/L (ref 6–29)
AST: 32 U/L (ref 10–35)
Albumin: 4.2 g/dL (ref 3.6–5.1)
Alkaline phosphatase (APISO): 72 U/L (ref 33–130)
BILIRUBIN TOTAL: 0.6 mg/dL (ref 0.2–1.2)
BUN: 22 mg/dL (ref 7–25)
CHLORIDE: 104 mmol/L (ref 98–110)
CO2: 29 mmol/L (ref 20–32)
Calcium: 10 mg/dL (ref 8.6–10.4)
Creat: 0.78 mg/dL (ref 0.60–0.88)
GFR, EST NON AFRICAN AMERICAN: 66 mL/min/{1.73_m2} (ref >=60)
GFR, Est African American: 76 mL/min/{1.73_m2} (ref >=60)
GLOBULIN: 2.1 g/dL (ref 1.9–3.7)
Glucose, Bld: 119 mg/dL — ABNORMAL HIGH (ref 65–99)
POTASSIUM: 4.3 mmol/L (ref 3.5–5.3)
SODIUM: 141 mmol/L (ref 135–146)
Total Protein: 6.3 g/dL (ref 6.1–8.1)

## 2018-01-26 LAB — TSH: TSH: 4.27 mIU/L (ref 0.40–4.50)

## 2018-01-26 LAB — FERRITIN: FERRITIN: 34 ng/mL (ref 20–288)

## 2018-01-26 LAB — CBC
HEMATOCRIT: 39.2 % (ref 35.0–45.0)
HEMOGLOBIN: 13.1 g/dL (ref 11.7–15.5)
MCH: 30.6 pg (ref 27.0–33.0)
MCHC: 33.4 g/dL (ref 32.0–36.0)
MCV: 91.6 fL (ref 80.0–100.0)
MPV: 9.8 fL (ref 7.5–12.5)
Platelets: 194 10*3/uL (ref 140–400)
RBC: 4.28 10*6/uL (ref 3.80–5.10)
RDW: 13.9 % (ref 11.0–15.0)
WBC: 7.1 10*3/uL (ref 3.8–10.8)

## 2018-02-02 ENCOUNTER — Non-Acute Institutional Stay: Payer: PPO | Admitting: Internal Medicine

## 2018-02-02 ENCOUNTER — Encounter: Payer: Self-pay | Admitting: Internal Medicine

## 2018-02-02 VITALS — BP 134/70 | HR 80 | Temp 98.3°F | Resp 16 | Ht 66.0 in | Wt 191.6 lb

## 2018-02-02 DIAGNOSIS — G8929 Other chronic pain: Secondary | ICD-10-CM

## 2018-02-02 DIAGNOSIS — N3281 Overactive bladder: Secondary | ICD-10-CM | POA: Diagnosis not present

## 2018-02-02 DIAGNOSIS — J309 Allergic rhinitis, unspecified: Secondary | ICD-10-CM | POA: Diagnosis not present

## 2018-02-02 DIAGNOSIS — E782 Mixed hyperlipidemia: Secondary | ICD-10-CM

## 2018-02-02 DIAGNOSIS — I10 Essential (primary) hypertension: Secondary | ICD-10-CM

## 2018-02-02 DIAGNOSIS — R739 Hyperglycemia, unspecified: Secondary | ICD-10-CM

## 2018-02-02 DIAGNOSIS — E669 Obesity, unspecified: Secondary | ICD-10-CM | POA: Diagnosis not present

## 2018-02-02 DIAGNOSIS — M5440 Lumbago with sciatica, unspecified side: Secondary | ICD-10-CM | POA: Diagnosis not present

## 2018-02-02 DIAGNOSIS — K219 Gastro-esophageal reflux disease without esophagitis: Secondary | ICD-10-CM | POA: Diagnosis not present

## 2018-02-02 DIAGNOSIS — G4733 Obstructive sleep apnea (adult) (pediatric): Secondary | ICD-10-CM | POA: Diagnosis not present

## 2018-02-02 DIAGNOSIS — D509 Iron deficiency anemia, unspecified: Secondary | ICD-10-CM

## 2018-02-02 MED ORDER — OXYBUTYNIN CHLORIDE ER 10 MG PO TB24: ORAL_TABLET | ORAL | 0 refills | Status: DC

## 2018-02-02 MED ORDER — MIRABEGRON ER 25 MG PO TB24: 25.0000 mg | ORAL_TABLET | Freq: Every day | ORAL | 3 refills | Status: DC

## 2018-02-02 NOTE — Progress Notes (Signed)
[facility]  Provider: Blanchie Serve MD   Location:  [place] of Service:  Clinic (12)  PCP: Blanchie Serve, MD Patient Care Team: Blanchie Serve, MD as PCP - General (Internal Medicine) Mast, Man X, NP as Nurse Practitioner (Internal Medicine)  Extended Emergency Contact Information Primary Emergency Contact: [facility] Address: [address]          [place], [place] [address] Johnnette Litter of [place] Phone: [phone] Mobile Phone: [phone] Relation: Son   Goals of Care: Advanced Directive information Advanced Directives 06/18/2017  Does Patient Have a Medical Advance Directive? Yes  Type of Paramedic of [place];Living will  Does patient want to make changes to medical advance directive? No - Patient declined  Copy of [place] in Chart? Yes      Chief Complaint  Patient presents with  . Medical Management of Chronic Issues    3 month follow up. Patient stated that she finished the Ferrous Sulfate, patient wants to know if she should get another refill.   . Medication Refill    No refills needed at this time.   Marland Kitchen Results    Discuss labs    HPI: Patient is a 84 y.o. female seen today for routine visit.   OAB- oxybutynin is not helping, currently on 10 mg daily, she has had accidents with episodes of incontinence. Complaints of dry mouth and dry eyes.   gerd- denies any heartburn or indigestion on regular basis, occasional episode with her meals and antacid helps, taking omeprazole 40 mg daily  Dry eyes- currently taking systane eye drops, this is helpful  Iron def anemia- currently on feso4 325 mg daily, improved hemoglobin on lab review today, denies constipation  Hypertension- currently on hyzaar 100-12.5 mg daily and baby aspirin. Denies headache, dizziness, chest pain and dyspnea  Allergic rhinitis- failed stopping claritin, takes it daily for now and has helped with  her symptom of stuffiness and runny nose    Past Medical History:  Diagnosis Date  . GERD (gastroesophageal reflux disease)   . Hx of cataract surgery    both eyes  . Hyperglycemia   . Hyperlipidemia   . Hypertension   . Lumbago 04/05/2016  . Macular degeneration    ? which eye(s)  . Obstructive sleep apnea   . Osteoporosis, senile   . Overactive bladder   . Squamous cell skin cancer 2/16   left lower leg   Past Surgical History:  Procedure Laterality Date  . ABDOMINAL HYSTERECTOMY  1960's  . APPENDECTOMY  1960's  . MOLE REMOVAL  2000   facial  . TONSILLECTOMY      reports that  has never smoked. she has never used smokeless tobacco. She reports that she drinks alcohol. She reports that she does not use drugs. Social History   Socioeconomic History  . Marital status: Widowed    Spouse name: Not on file  . Number of children: Not on file  . Years of education: Not on file  . Highest education level: Not on file  Social Needs  . Financial resource strain: Not on file  . Food insecurity - worry: Not on file  . Food insecurity - inability: Not on file  . Transportation needs - medical: Not on file  . Transportation needs - non-medical: Not on file  Occupational History  . 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 Smoking status: Never Smoker  .  Smokeless tobacco: Never Used  Substance and Sexual Activity  . Alcohol use: Yes    Comment: occassionally 1/2 glass of wine 1-2 x month  . Drug use: No  . Sexual activity: No  Other Topics Concern  . Not on file  Social History Narrative   [facility] since [facility]/12/15   Widow   Never smoked   Alcohol occasionally 1/2 glass of wine   Exercise none   POA, Living Will   Walks with walker    Functional Status Survey:    Family History  Problem Relation Age of Onset  . Heart disease Mother   . Heart disease Father     Health Maintenance  Topic Date Due  . DEXA SCAN  08/04/2018  (Originally 01/23/1991)  . TETANUS/TDAP  12/14/2022  . INFLUENZA VACCINE  Completed  . PNA vac Low Risk Adult  Completed    Allergies  Allergen Reactions  . Adhesive [Tape]   . Ampicillin   . Nitrofuran Derivatives   . Sulfur   . Zoloft [Sertraline Hcl] Other (See Comments)    Dizziness, nausea, faint feeling, and nightmares      Outpatient Encounter Medications as of 02/02/2018  Medication Sig  . acetaminophen (TYLENOL) 325 MG tablet Take 650 mg 2 (two) times daily as needed by mouth.  Marland Kitchen aspirin EC 81 MG tablet Take 1 tablet (81 mg total) by mouth daily.  . Calcium Carb-Cholecalciferol (CALCIUM 600+D3) 600-800 MG-UNIT TABS Take by mouth. Take one daily  . ferrous sulfate 325 (65 FE) MG tablet Take 1 tablet (325 mg total) daily with breakfast by mouth.  . loratadine (CLARITIN) 10 MG tablet Take 10 mg by mouth daily.  Marland Kitchen LORazepam (ATIVAN) 0.5 MG tablet TAKE ONE TABLET ONCE DAILY AS NEEDED FOR ANXIETY.  Marland Kitchen losartan-hydrochlorothiazide (HYZAAR) 100-12.5 MG tablet Take 1 tablet by mouth daily.  . Menthol, Topical Analgesic, (BIOFREEZE EX) Apply 1 application topically as needed.  . Multiple Vitamins-Minerals (ICAPS) CAPS Take by mouth. Take one I-Cap daily for eyes  . Omega-3 Fatty Acids (FISH OIL) 1200 MG CAPS Take by mouth. Take one daily  . omeprazole (PRILOSEC) 40 MG capsule TAKE 1 CAPSULE DAILY 1/2 HOUR BEFORE BREAKFAST FOR STOMACH ACID.  Marland Kitchen oxybutynin (DITROPAN XL) 10 MG 24 hr tablet Take 1 tablet (10 mg total) at bedtime by mouth.  Vladimir Faster Glycol-Propyl Glycol (SYSTANE) 0.4-0.3 % GEL ophthalmic gel Place 1 application into both eyes 3 (three) times daily.   Marland Kitchen Zoster Vac Recomb Adjuvanted (SHINGRIX IM) Inject 0.5 mLs into the muscle once. 2 doses to be provided, give the 1st dose followed by 2nd dose at least 2 months.  . traMADol (ULTRAM) 50 MG tablet Take 1 tablet (50 mg total) by mouth every 6 (six) hours as needed for severe pain. Pt has this medication at home- no new supply  provided. (Patient not taking: Reported on 02/02/2018)  . [DISCONTINUED] loratadine (CLARITIN) 10 MG tablet Take 1 tablet (10 mg total) daily by mouth. For 2 weeks, then daily as needed only for nasal congestion. Allergies.   No facility-administered encounter medications on file as of 02/02/2018.     Review of Systems  Constitutional: Negative for appetite change, chills and fever.  HENT: Positive for congestion and rhinorrhea. Negative for mouth sores, sinus pressure, sinus pain and trouble swallowing.   Eyes: Positive for visual disturbance.  Respiratory: Negative for cough and shortness of breath.        Uses CPAP at night.  Cardiovascular: Positive for leg swelling. Negative for chest pain and palpitations.  Gastrointestinal: Negative for abdominal pain, constipation, diarrhea, nausea and vomiting.  Genitourinary: Positive for frequency. Negative for dysuria, pelvic pain and vaginal discharge.       UI  Musculoskeletal: Positive for arthralgias, back pain and gait problem.       No fall reported, uses walker for ambulation and uses cane at times  Skin: Negative for rash.  Neurological: Negative for dizziness and headaches.  Psychiatric/Behavioral: Negative for behavioral problems and confusion.    Vitals:   02/02/18 1130  BP: 134/70  Pulse: 80  Resp: 16  Temp: 98.3 F (36.8 C)  TempSrc: Oral  SpO2: 98%  Weight: 191 lb 9.6 oz (86.9 kg)  Height: 5\' 6"  (1.676 m)   Body mass index is 30.93 kg/m.   Wt Readings from Last 3 Encounters:  02/02/18 191 lb 9.6 oz (86.9 kg)  12/31/17 189 lb 9.6 oz (86 kg)  11/03/17 188 lb (85.3 kg)   Physical Exam  Constitutional: She is oriented to person, place, and time.  Obese elderly female in NAD  HENT:  Head: Normocephalic and atraumatic.  Mouth/Throat: Oropharynx is clear and moist.  Eyes: Conjunctivae and EOM are normal. Right eye exhibits no discharge. Left eye exhibits no discharge.  Neck: Normal range of motion. Neck supple.    Cardiovascular: Normal rate and regular rhythm.  Pulmonary/Chest: Effort normal and breath sounds normal. She has no wheezes. She has no rales.  Abdominal: Soft. Bowel sounds are normal. There is no tenderness.  Musculoskeletal: She exhibits edema and deformity.  Unsteady gait, uses walker, edema present  Lymphadenopathy:    She has no cervical adenopathy.  Neurological: She is alert and oriented to person, place, and time.  Skin: Skin is warm and dry. She is not diaphoretic.  Psychiatric: She has a normal mood and affect.    Labs reviewed: Basic Metabolic Panel: Recent Labs    03/09/17 0001 07/06/17 1542 01/25/18 0000  NA 140 140 141  K 3.8 3.6 4.3  CL 104 104 104  CO2 26 27 29   GLUCOSE 105* 99 119*  BUN 26* 23 22  CREATININE 0.82 0.86 0.78  CALCIUM 10.0 9.4 10.0   Liver Function Tests: Recent Labs    03/09/17 0001 07/06/17 1542 01/25/18 0000  AST 26 20 32  ALT 19 13 26   ALKPHOS 72 59  --   BILITOT 0.4 0.5 0.6  PROT 6.1 5.7* 6.3  ALBUMIN 4.1 3.8  --    Recent Labs    03/09/17 0001  LIPASE 7  AMYLASE 41   No results for input(s): AMMONIA in the last 8760 hours. CBC: Recent Labs    07/06/17 1542 07/20/17 0819 10/13/17 01/25/18 0000  WBC 7.3 6.5 6.2 7.1  HGB 10.9* 10.9* 12.3 13.1  HCT 31.8* 33.7* 36 39.2  MCV 83.7 88.0  --  91.6  PLT 205 219 180 194   Cardiac Enzymes: No results for input(s): CKTOTAL, CKMB, CKMBINDEX, TROPONINI in the last 8760 hours. BNP: Invalid input(s): POCBNP Lab Results  Component Value Date   HGBA1C 5.7 03/18/2016   Lab Results  Component Value Date   TSH 4.27 01/25/2018   Lab Results  Component Value Date   VITAMINB12 420 07/20/2017   Lab Results  Component Value Date   FOLATE 13.5 07/20/2017   Lab Results  Component Value Date   FERRITIN 34 01/25/2018    Lipid Panel: Recent Labs    01/25/18 0000  CHOL 190  HDL 51  TRIG 107  CHOLHDL 3.7   Lab Results  Component Value Date   HGBA1C 5.7 03/18/2016     Procedures since last visit: No results found.  Assessment/Plan  1. OAB (overactive bladder) Wean off oxybutynin in a week. Start myrbetriq 25 mg daily, script [facility]. Perineal care - oxybutynin (DITROPAN XL) 10 MG 24 hr tablet; Take one tablet every other day for 1 week and stop it.  Dispense: 7 tablet; Refill: 0  2. Essential hypertension Continue hyzaar, reviewed BMP - CBC (no diff); Future - Basic Metabolic Panel; Future  3. Obstructive sleep apnea Continue CPAP, weight loss encouraged  4. Gastroesophageal reflux disease without esophagitis Continue omeprazole  5. Allergic rhinitis, unspecified seasonality, unspecified trigger Continue loratadine  6. Chronic bilateral low back pain with sciatica, sciatica laterality unspecified C/w tylenol 650 mg daily and menthol gel as needed. Has not required tramadol. Use assistive device and back precautions  7. Mixed hyperlipidemia Elevated LDL. Pt refuses statin for now and with her age agree to defer statin for now but with her risk factor, encouraged weight loss and dietary counseling - Hemoglobin A1c; Future  8. Iron deficiency anemia, unspecified iron deficiency anemia type Improved ferritin and Hb level. Continue feso4 325 mg daily for now.   9. Obesity (BMI 30-39.9) Gaining weight. Has OSA. Elevated LDL and glucose level. Limited mobility with hr lumbago and OA. Activities as tolerated. Dietary counseling provided.   10. Hyperglycemia Counseled on cutting back on sweets and desserts, pt agrees.  - Hemoglobin A1c; Future    Labs/tests ordered:   Lab Orders     CBC (no diff)     Basic Metabolic Panel     Hemoglobin A1c  Next appointment: reviewed care plan with patient    Communication: reviewed care plan with patient    Blanchie Serve, MD Internal Medicine [place], [place] [address] Cell Phone (Monday-Friday 8 am - 5 pm):  [phone] On Call: [phone] and follow prompts after 5 pm and on weekends Office Phone: [phone] Office Fax: [phone]

## 2018-02-02 NOTE — Patient Instructions (Signed)
Take oxybutynin/ ditropan every other day for a week and stop it. New medication for bladder problem has been sent to your pharmacy, take it once a day. Reading material on this medicine is provided to you. Start taking your iron once a day. Cut down on sweets and fried food. You need to try and lose weight and eat healthy.     Mirabegron extended-release tablets What is this medicine? MIRABEGRON (MIR a BEG ron) is used to treat overactive bladder. This medicine reduces the amount of bathroom visits. It may also help to control wetting accidents. This medicine may be used for other purposes; ask your health care provider or pharmacist if you have questions. COMMON BRAND NAME(S): Myrbetriq What should I tell my health care provider before I take this medicine? They need to know if you have any of these conditions: -difficulty passing urine -high blood pressure -kidney disease -liver disease -an unusual or allergic reaction to mirabegron, other medicines, foods, dyes, or preservatives -pregnant or trying to get pregnant -breast-feeding How should I use this medicine? Take this medicine by mouth with a glass of water. Follow the directions on the prescription label. Do not cut, crush or chew this medicine. You can take it with or without food. If it upsets your stomach, take it with food. Take your medicine at regular intervals. Do not take it more often than directed. Do not stop taking except on your doctor's advice. Talk to your pediatrician regarding the use of this medicine in children. Special care may be needed. Overdosage: If you think you have taken too much of this medicine contact a poison control center or emergency room at once. NOTE: This medicine is only for you. Do not share this medicine with others. What if I miss a dose? If you miss a dose, take it as soon as you can. If it is almost time for your next dose, take only that dose. Do not take double or extra doses. What may  interact with this medicine? -certain medicines for bladder problems like fesoterodine, oxybutynin, solifenacin, tolterodine -desipramine -digoxin -flecainide -ketoconazole -MAOIs like Carbex, Eldepryl, Marplan, Nardil, and Parnate -metoprolol -propafenone -thioridazine -warfarin This list may not describe all possible interactions. Give your health care provider a list of all the medicines, herbs, non-prescription drugs, or dietary supplements you use. Also tell them if you smoke, drink alcohol, or use illegal drugs. Some items may interact with your medicine. What should I watch for while using this medicine? It may take 8 weeks to notice the full benefit from this medicine. You may need to limit your intake tea, coffee, caffeinated sodas, and alcohol. These drinks may make your symptoms worse. Visit your doctor or health care professional for regular checks on your progress. Check your blood pressure as directed. Ask your doctor or health care professional what your blood pressure should be and when you should contact him or her. What side effects may I notice from receiving this medicine? Side effects that you should report to your doctor or health care professional as soon as possible: -allergic reactions like skin rash, itching or hives, swelling of the face, lips, or tongue -chest pain or palpitations -severe or sudden headache -high blood pressure -fast, irregular heartbeat -redness, blistering, peeling or loosening of the skin, including inside the mouth -signs of infection like fever or chills; cough; sore throat; pain or difficulty passing urine -trouble passing urine or change in the amount of urine Side effects that usually do not require medical  attention (report to your doctor or health care professional if they continue or are bothersome): -constipation -diarrhea -dizziness -dry eyes -joint pain -mild headache -nausea -runny nose This list may not describe all  possible side effects. Call your doctor for medical advice about side effects. You may report side effects to FDA at 1-800-FDA-1088. Where should I keep my medicine? Keep out of the reach of children. Store at room temperature between 15 and 30 degrees C (59 and 86 degrees F). Throw away any unused medicine after the expiration date. NOTE: This sheet is a summary. It may not cover all possible information. If you have questions about this medicine, talk to your doctor, pharmacist, or health care provider.  2018 Elsevier/Gold Standard (2016-01-17 12:14:30)

## 2018-02-05 DIAGNOSIS — Z85828 Personal history of other malignant neoplasm of skin: Secondary | ICD-10-CM | POA: Diagnosis not present

## 2018-02-05 DIAGNOSIS — D2262 Melanocytic nevi of left upper limb, including shoulder: Secondary | ICD-10-CM | POA: Diagnosis not present

## 2018-02-05 DIAGNOSIS — D1801 Hemangioma of skin and subcutaneous tissue: Secondary | ICD-10-CM | POA: Diagnosis not present

## 2018-02-05 DIAGNOSIS — D225 Melanocytic nevi of trunk: Secondary | ICD-10-CM | POA: Diagnosis not present

## 2018-02-05 DIAGNOSIS — D224 Melanocytic nevi of scalp and neck: Secondary | ICD-10-CM | POA: Diagnosis not present

## 2018-02-05 DIAGNOSIS — L814 Other melanin hyperpigmentation: Secondary | ICD-10-CM | POA: Diagnosis not present

## 2018-02-05 DIAGNOSIS — L821 Other seborrheic keratosis: Secondary | ICD-10-CM | POA: Diagnosis not present

## 2018-02-09 ENCOUNTER — Telehealth: Payer: Self-pay

## 2018-02-09 NOTE — Telephone Encounter (Signed)
Jean [name] called the clinic to say that she would start taking her oxybutynin tomorrow and just wanted to talk you about it to recomfirm what was talked about at her last clinic appointment.

## 2018-02-10 NOTE — Telephone Encounter (Signed)
I spoke with Jean [name] she stated that she has stopped taking the oxybutynin but during the process of stopping she started to experience headaches and feeling discombobulated. She wants to know should she start the myrbetriq as planned?

## 2018-02-10 NOTE — Telephone Encounter (Signed)
  I want her to take oxybutynin every other day for 1 week from 02/02/18 and stop it. She needs to start taking myrbetriq 25 mg daily. Has she started it?

## 2018-02-10 NOTE — Telephone Encounter (Signed)
Noted, have her start taking myrbetriq.

## 2018-02-12 ENCOUNTER — Encounter: Payer: Self-pay | Admitting: Internal Medicine

## 2018-02-15 ENCOUNTER — Telehealth: Payer: Self-pay | Admitting: *Deleted

## 2018-02-15 NOTE — Telephone Encounter (Signed)
Patient called wanting to speak with [place]. Left message on Clinical Intake stating that she is still having what feels like a heavy brick on her head with some dizziness. Wants to know if she should still stay on the Myrebetriq. Stated that it has slowed down the urine flow but she doesn't like the feelings she is having. Stated that she went down to the clinic this morning and the nurse checked her vitals and they were normal. Patient would like for [place] to return her call.

## 2018-02-15 NOTE — Telephone Encounter (Signed)
Spoke with Jean [name] and she stated that she has been having some dizziness and nausea at time. She mentioned that while knitting over the weekend her eyes became watery to the point where she couldn't focus on her knitting and stopped. Patient is schedule to see Dr. Bubba Camp 02-16-18 at 9 am to discuss her concerns with Dr. Bubba Camp. Patient is aware of appointment and time.

## 2018-02-16 ENCOUNTER — Non-Acute Institutional Stay: Payer: PPO | Admitting: Internal Medicine

## 2018-02-16 ENCOUNTER — Encounter: Payer: Self-pay | Admitting: Internal Medicine

## 2018-02-16 VITALS — BP 124/78 | HR 85 | Temp 98.0°F | Resp 16 | Ht 66.0 in | Wt 182.8 lb

## 2018-02-16 DIAGNOSIS — R634 Abnormal weight loss: Secondary | ICD-10-CM | POA: Diagnosis not present

## 2018-02-16 DIAGNOSIS — R51 Headache: Secondary | ICD-10-CM

## 2018-02-16 DIAGNOSIS — N3281 Overactive bladder: Secondary | ICD-10-CM

## 2018-02-16 DIAGNOSIS — J309 Allergic rhinitis, unspecified: Secondary | ICD-10-CM

## 2018-02-16 DIAGNOSIS — I10 Essential (primary) hypertension: Secondary | ICD-10-CM

## 2018-02-16 DIAGNOSIS — G4733 Obstructive sleep apnea (adult) (pediatric): Secondary | ICD-10-CM | POA: Diagnosis not present

## 2018-02-16 DIAGNOSIS — R519 Headache, unspecified: Secondary | ICD-10-CM

## 2018-02-16 NOTE — Progress Notes (Signed)
[facility]  Provider: Blanchie Serve MD   Location:  [place] of Service:  Clinic (12)  PCP: Blanchie Serve, MD Patient Care Team: Blanchie Serve, MD as PCP - General (Internal Medicine) Mast, Man X, NP as Nurse Practitioner (Internal Medicine)  Extended Emergency Contact Information Primary Emergency Contact: [facility] Address: [address]          [place], [place] [address] Johnnette Litter of [place] Phone: [phone] Mobile Phone: [phone] Relation: Son  Goals of Care: Advanced Directive information Advanced Directives 06/18/2017  Does Patient Have a Medical Advance Directive? Yes  Type of Paramedic of [place];Living will  Does patient want to make changes to medical advance directive? No - Patient declined  Copy of [place] in Chart? Yes      Chief Complaint  Patient presents with  . Acute Visit    Concerns about Myrbetriq. Patient stated that hasn't had any dizziness this morning at present.   . Medication Refill    No refills needed at this time.    HPI: Patient is a 84 y.o. female seen today for acute visit for OAB. She was tapered off oxybutynin last visit and started on myrbetriq 25 mg daily. She mentions that this has helped decrease her urinary frequency and decreased her need to change her pads. She has to wake up only once at night to urinate compared to twice before. She is happy with the effect of medication. She complaints of headache from second day of taking myrbetriq. She felt like brick sitting on your head, teary eyes with occasional nausea and dizziness. No fall reported.   Past Medical History:  Diagnosis Date  . GERD (gastroesophageal reflux disease)   . Hx of cataract surgery    both eyes  . Hyperglycemia   . Hyperlipidemia   . Hypertension   . Lumbago 04/05/2016  . Macular degeneration    ? which eye(s)  . Obstructive sleep apnea   .  Osteoporosis, senile   . Overactive bladder   . Squamous cell skin cancer 2/16   left lower leg   Past Surgical History:  Procedure Laterality Date  . ABDOMINAL HYSTERECTOMY  1960's  . APPENDECTOMY  1960's  . MOLE REMOVAL  2000   facial  . TONSILLECTOMY      reports that  has never smoked. she has never used smokeless tobacco. She reports that she drinks alcohol. She reports that she does not use drugs. Social History   Socioeconomic History  . Marital status: Widowed    Spouse name: Not on file  . Number of children: Not on file  . Years of education: Not on file  . Highest education level: Not on file  Social Needs  . Financial resource strain: Not on file  . Food insecurity - worry: Not on file  . Food insecurity - inability: Not on file  . Transportation needs - medical: Not on file  . Transportation needs - non-medical: Not on file  Occupational History  . 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 Smoking status: Never Smoker  . Smokeless tobacco: Never Used  Substance and Sexual Activity  . Alcohol use: Yes    Comment: occassionally 1/2 glass of wine 1-2 x month  . Drug use: No  . Sexual activity: No  Other Topics Concern  . Not on file  Social History Narrative   Lives at [facility]  since 11/12/15   Widow   Never smoked   Alcohol occasionally 1/2 glass of wine   Exercise none   POA, Living Will   Walks with walker     Family History  Problem Relation Age of Onset  . Heart disease Mother   . Heart disease Father     Health Maintenance  Topic Date Due  . DEXA SCAN  08/04/2018 (Originally 01/23/1991)  . TETANUS/TDAP  12/14/2022  . INFLUENZA VACCINE  Completed  . PNA vac Low Risk Adult  Completed    Allergies  Allergen Reactions  . Adhesive [Tape]   . Ampicillin   . Nitrofuran Derivatives   . Sulfur   . Zoloft [Sertraline Hcl] Other (See Comments)    Dizziness, nausea, faint feeling, and nightmares      Outpatient  Encounter Medications as of 02/16/2018  Medication Sig  . acetaminophen (TYLENOL) 325 MG tablet Take 650 mg by mouth daily.  Marland Kitchen aspirin EC 81 MG tablet Take 1 tablet (81 mg total) by mouth daily.  . Calcium Carb-Cholecalciferol (CALCIUM 600+D3) 600-800 MG-UNIT TABS Take by mouth. Take one daily  . ferrous sulfate 325 (65 FE) MG tablet Take 1 tablet (325 mg total) daily with breakfast by mouth.  . loratadine (CLARITIN) 10 MG tablet Take 10 mg by mouth daily.  Marland Kitchen LORazepam (ATIVAN) 0.5 MG tablet TAKE ONE TABLET ONCE DAILY AS NEEDED FOR ANXIETY.  Marland Kitchen losartan-hydrochlorothiazide (HYZAAR) 100-12.5 MG tablet Take 1 tablet by mouth daily.  . Menthol, Topical Analgesic, (BIOFREEZE EX) Apply 1 application topically as needed.  . mirabegron ER (MYRBETRIQ) 25 MG TB24 tablet Take 1 tablet (25 mg total) by mouth daily.  . Multiple Vitamins-Minerals (ICAPS) CAPS Take by mouth. Take one I-Cap daily for eyes  . Omega-3 Fatty Acids (FISH OIL) 1200 MG CAPS Take by mouth. Take one daily  . omeprazole (PRILOSEC) 40 MG capsule TAKE 1 CAPSULE DAILY 1/2 HOUR BEFORE BREAKFAST FOR STOMACH ACID.  Marland Kitchen Polyethyl Glycol-Propyl Glycol (SYSTANE) 0.4-0.3 % GEL ophthalmic gel Place 1 application into both eyes 3 (three) times daily.   . traMADol (ULTRAM) 50 MG tablet Take 1 tablet (50 mg total) by mouth every 6 (six) hours as needed for severe pain. Pt has this medication at home- no new supply provided.  Marland Kitchen oxybutynin (DITROPAN XL) 10 MG 24 hr tablet Take one tablet every other day for 1 week and stop it. (Patient not taking: Reported on 02/16/2018)  . Zoster Vac Recomb Adjuvanted (SHINGRIX IM) Inject 0.5 mLs into the muscle once. 2 doses to be provided, give the 1st dose followed by 2nd dose at least 2 months.   No facility-administered encounter medications on file as of 02/16/2018.     Review of Systems  Constitutional: Negative for appetite change, chills, fatigue and fever.  HENT: Positive for congestion and rhinorrhea.  Negative for postnasal drip, sinus pressure, sinus pain, sore throat and trouble swallowing.   Eyes: Positive for visual disturbance.  Respiratory: Negative for shortness of breath.   Cardiovascular: Negative for chest pain.  Genitourinary: Negative for dysuria and hematuria.  Skin: Negative for rash.  Neurological: Positive for dizziness and headaches.    Vitals:   02/16/18 0900  BP: 124/78  Pulse: 85  Resp: 16  Temp: 98 F (36.7 C)  TempSrc: Oral  SpO2: 96%  Weight: 182 lb 12.8 oz (82.9 kg)  Height: 5\' 6"  (1.676 m)   Body mass index is 29.5 kg/m.   Wt Readings from Last 3 Encounters:  02/16/18 182 lb 12.8 oz (82.9 kg)  02/02/18 191 lb 9.6 oz (86.9 kg)  12/31/17 189 lb 9.6 oz (86 kg)   Physical Exam  Constitutional: She is oriented to person, place, and time. She appears well-developed. No distress.  overweight  HENT:  Head: Normocephalic and atraumatic.  Mouth/Throat: Oropharynx is clear and moist.  No temporal tenderness  Eyes: Conjunctivae and EOM are normal. Right eye exhibits no discharge. Left eye exhibits no discharge.  Neck: Neck supple.  Cardiovascular: Normal rate and regular rhythm.  Pulmonary/Chest: Effort normal and breath sounds normal.  Abdominal: Soft. Bowel sounds are normal. She exhibits no distension. There is no tenderness.  Lymphadenopathy:    She has no cervical adenopathy.  Neurological: She is alert and oriented to person, place, and time.  Skin: Skin is warm and dry. She is not diaphoretic.    Labs reviewed: Basic Metabolic Panel: Recent Labs    03/09/17 0001 07/06/17 1542 01/25/18 0000  NA 140 140 141  K 3.8 3.6 4.3  CL 104 104 104  CO2 26 27 29   GLUCOSE 105* 99 119*  BUN 26* 23 22  CREATININE 0.82 0.86 0.78  CALCIUM 10.0 9.4 10.0   Liver Function Tests: Recent Labs    03/09/17 0001 07/06/17 1542 01/25/18 0000  AST 26 20 32  ALT 19 13 26   ALKPHOS 72 59  --   BILITOT 0.4 0.5 0.6  PROT 6.1 5.7* 6.3  ALBUMIN 4.1 3.8  --     Recent Labs    03/09/17 0001  LIPASE 7  AMYLASE 41   No results for input(s): AMMONIA in the last 8760 hours. CBC: Recent Labs    07/06/17 1542 07/20/17 0819 10/13/17 01/25/18 0000  WBC 7.3 6.5 6.2 7.1  HGB 10.9* 10.9* 12.3 13.1  HCT 31.8* 33.7* 36 39.2  MCV 83.7 88.0  --  91.6  PLT 205 219 180 194   Cardiac Enzymes: No results for input(s): CKTOTAL, CKMB, CKMBINDEX, TROPONINI in the last 8760 hours. BNP: Invalid input(s): POCBNP Lab Results  Component Value Date   HGBA1C 5.7 03/18/2016   Lab Results  Component Value Date   TSH 4.27 01/25/2018   Lab Results  Component Value Date   VITAMINB12 420 07/20/2017   Lab Results  Component Value Date   FOLATE 13.5 07/20/2017   Lab Results  Component Value Date   FERRITIN 34 01/25/2018    Lipid Panel: Recent Labs    01/25/18 0000  CHOL 190  HDL 51  TRIG 107  CHOLHDL 3.7   Lab Results  Component Value Date   HGBA1C 5.7 03/18/2016    Procedures since last visit: No results found.  Assessment/Plan  1. OAB (overactive bladder) Continue myrbetriq 25 mg daily. Explained to pt that her headache and dizziness could be side effect from myrbetriq given the symptom started with her taking the medication. But medication has helped with her bladder symptom and the side effects has subsided with days. Thus, continue this medication for now. Advised to take tylenol if needed for headache. If symptoms persists or worsens, will take her off myrbetriq.   2. Allergic rhinitis, unspecified seasonality, unspecified trigger Symptoms improved, continue loratadine  3. Headache, unspecified headache type With onset of symptom, concern for side effect from myrbetriq, improved from time of onset 2 weeks back. Monitor for now. Advised tylenol as needed for now.   4. Essential hypertension orthostasis ruled out. BP stable. Continue current regimen  5. Weight loss Intentional, dietary modification  done by patient,  congratulated her. Monitor weight.     Labs/tests ordered:  None  Next appointment: has appointment for physical in June 2019. Earlier if needed.   Communication: reviewed care plan with patient     Blanchie Serve, MD Internal Medicine [place], [place] [address] Cell Phone (Monday-Friday 8 am - 5 pm): [phone] On Call: [phone] and follow prompts after 5 pm and on weekends Office Phone: [phone] Office Fax: [phone]

## 2018-02-16 NOTE — Patient Instructions (Signed)
Continue to take your myrbetriq for now. Drink plenty of fluids.

## 2018-02-25 DIAGNOSIS — H524 Presbyopia: Secondary | ICD-10-CM | POA: Diagnosis not present

## 2018-02-25 DIAGNOSIS — Z961 Presence of intraocular lens: Secondary | ICD-10-CM | POA: Diagnosis not present

## 2018-02-25 DIAGNOSIS — H52223 Regular astigmatism, bilateral: Secondary | ICD-10-CM | POA: Diagnosis not present

## 2018-02-25 DIAGNOSIS — H5202 Hypermetropia, left eye: Secondary | ICD-10-CM | POA: Diagnosis not present

## 2018-04-05 ENCOUNTER — Telehealth: Payer: Self-pay | Admitting: *Deleted

## 2018-04-05 NOTE — Telephone Encounter (Signed)
Received a call from Ms Pare regarding her medication Loratadine, she stated that she has used all of this medication but doesn't think that it helped much. She stated that she spoke with the pharmacist at [facility] and that she recommended Fexofenadine hydrochloride (180 mg QD) and fluticasone propionate 50 mcg. The directions on the box are as stated:for the nose spray (Week 1- use 2 sprays in each nostril once daily. Week 2 through 6 mos - use 1-2 sprays in each nostril once daily, as needed to treat your symptoms. After 6 mos, check with your doctor if you should still use.)   Arthur Holms  [phone]

## 2018-04-06 NOTE — Telephone Encounter (Signed)
Inform patient to stop loratadine. Start fexofenadine 180 mg daily and flonase 1 spray to each nostril daily for 2 weeks. Notify if she sees improvement in symptom or not.

## 2018-04-06 NOTE — Telephone Encounter (Signed)
Spoke with patient regarding her medications , she stated that she would let you know if symptoms persist. She stated that she is unable to afford the Mybetriq ($100.00) and wants to know if there is anything else that you would like for her to use or would you prefer she go back to Ditropan XL.

## 2018-04-07 DIAGNOSIS — N8110 Cystocele, unspecified: Secondary | ICD-10-CM | POA: Diagnosis not present

## 2018-04-07 NOTE — Telephone Encounter (Signed)
Spoke with patient she stated that she would call insurance company about the prior authorization and get back with me. She also stated that she would continue to take the medication that she has left (Myrbetriq).

## 2018-04-07 NOTE — Telephone Encounter (Signed)
From last clinic visit, myrbetriq was helping with her symptom of overactive bladder. She has been on ditropan/ oxybutynin in the past without help. Thus I dont want to go back to oxybutynin. Can she check with her insurance if prior authorization can help reduce cost for the medication? If yes, I can do PA.

## 2018-04-12 NOTE — Telephone Encounter (Signed)
Received Prior Authorization form for patient's Myrbetriz. Faxed to [facility] for Dr. Bubba Camp to review and sign. Placed original in Dr. Jackolyn Confer box.

## 2018-04-14 NOTE — Telephone Encounter (Signed)
Received fax from [facility]  [phone] Myrbetriq prior authorization was DENIED. Stated it cannot be approved because it is already at the lowest possible tier.

## 2018-04-15 ENCOUNTER — Telehealth: Payer: Self-pay

## 2018-04-15 MED ORDER — OXYBUTYNIN CHLORIDE ER 10 MG PO TB24: 10.0000 mg | ORAL_TABLET | Freq: Every day | ORAL | 5 refills | Status: DC

## 2018-04-15 NOTE — Telephone Encounter (Signed)
Per Dr. Bubba Camp patient is to stop taking myrbetriq. Start taking Oxybutynin 10 mg 24hr daily at bedtime. Script has been sent to the pharmacy. Patient is aware and notified.

## 2018-04-15 NOTE — Telephone Encounter (Signed)
Received a denial fax for Ms. Jean [name]. I explained to her that it looks like what she is currently paying for the Myrbetriq is the lowest that they will go. She mentioned that she would like to go back to taking Oxybutynin 10 mg. She stated that she has 20 pills left of the Myrbetriq and she wanted to know should keep taking them or go back to taking the Oxybutynin? Please advise

## 2018-05-04 DIAGNOSIS — I10 Essential (primary) hypertension: Secondary | ICD-10-CM | POA: Diagnosis not present

## 2018-05-04 DIAGNOSIS — E782 Mixed hyperlipidemia: Secondary | ICD-10-CM

## 2018-05-04 DIAGNOSIS — R739 Hyperglycemia, unspecified: Secondary | ICD-10-CM | POA: Diagnosis not present

## 2018-05-05 LAB — CBC
HCT: 38.9 % (ref 35.0–45.0)
HEMOGLOBIN: 13.3 g/dL (ref 11.7–15.5)
MCH: 31.2 pg (ref 27.0–33.0)
MCHC: 34.2 g/dL (ref 32.0–36.0)
MCV: 91.3 fL (ref 80.0–100.0)
MPV: 9.6 fL (ref 7.5–12.5)
Platelets: 194 10*3/uL (ref 140–400)
RBC: 4.26 10*6/uL (ref 3.80–5.10)
RDW: 13.9 % (ref 11.0–15.0)
WBC: 6.6 10*3/uL (ref 3.8–10.8)

## 2018-05-05 LAB — BASIC METABOLIC PANEL
BUN/Creatinine Ratio: 23 (calc) — ABNORMAL HIGH (ref 6–22)
BUN: 21 mg/dL (ref 7–25)
CALCIUM: 9.9 mg/dL (ref 8.6–10.4)
CO2: 29 mmol/L (ref 20–32)
Chloride: 104 mmol/L (ref 98–110)
Creat: 0.9 mg/dL — ABNORMAL HIGH (ref 0.60–0.88)
GLUCOSE: 101 mg/dL — AB (ref 65–99)
POTASSIUM: 3.8 mmol/L (ref 3.5–5.3)
Sodium: 139 mmol/L (ref 135–146)

## 2018-05-05 LAB — HEMOGLOBIN A1C
HEMOGLOBIN A1C: 5.4 %{Hb} (ref <5.7)
MEAN PLASMA GLUCOSE: 108 (calc)
eAG (mmol/L): 6 (calc)

## 2018-05-06 DIAGNOSIS — Z85828 Personal history of other malignant neoplasm of skin: Secondary | ICD-10-CM | POA: Diagnosis not present

## 2018-05-06 DIAGNOSIS — D225 Melanocytic nevi of trunk: Secondary | ICD-10-CM | POA: Diagnosis not present

## 2018-05-06 DIAGNOSIS — L821 Other seborrheic keratosis: Secondary | ICD-10-CM | POA: Diagnosis not present

## 2018-05-11 ENCOUNTER — Encounter: Payer: Self-pay | Admitting: Internal Medicine

## 2018-05-18 ENCOUNTER — Encounter: Payer: Self-pay | Admitting: Internal Medicine

## 2018-05-18 DIAGNOSIS — R2681 Unsteadiness on feet: Secondary | ICD-10-CM | POA: Diagnosis not present

## 2018-05-18 DIAGNOSIS — N3281 Overactive bladder: Secondary | ICD-10-CM | POA: Diagnosis not present

## 2018-05-18 DIAGNOSIS — M6281 Muscle weakness (generalized): Secondary | ICD-10-CM | POA: Diagnosis not present

## 2018-05-18 DIAGNOSIS — M545 Low back pain: Secondary | ICD-10-CM | POA: Diagnosis not present

## 2018-05-18 DIAGNOSIS — R42 Dizziness and giddiness: Secondary | ICD-10-CM | POA: Diagnosis not present

## 2018-05-18 DIAGNOSIS — D049 Carcinoma in situ of skin, unspecified: Secondary | ICD-10-CM | POA: Diagnosis not present

## 2018-05-31 DIAGNOSIS — R2681 Unsteadiness on feet: Secondary | ICD-10-CM | POA: Diagnosis not present

## 2018-05-31 DIAGNOSIS — N3281 Overactive bladder: Secondary | ICD-10-CM | POA: Diagnosis not present

## 2018-05-31 DIAGNOSIS — M6281 Muscle weakness (generalized): Secondary | ICD-10-CM | POA: Diagnosis not present

## 2018-05-31 DIAGNOSIS — G4733 Obstructive sleep apnea (adult) (pediatric): Secondary | ICD-10-CM | POA: Diagnosis not present

## 2018-05-31 DIAGNOSIS — R42 Dizziness and giddiness: Secondary | ICD-10-CM | POA: Diagnosis not present

## 2018-05-31 DIAGNOSIS — D049 Carcinoma in situ of skin, unspecified: Secondary | ICD-10-CM | POA: Diagnosis not present

## 2018-05-31 DIAGNOSIS — M545 Low back pain: Secondary | ICD-10-CM | POA: Diagnosis not present

## 2018-06-01 ENCOUNTER — Encounter: Payer: Self-pay | Admitting: Internal Medicine

## 2018-06-03 ENCOUNTER — Encounter: Payer: Self-pay | Admitting: Nurse Practitioner

## 2018-06-03 ENCOUNTER — Non-Acute Institutional Stay: Payer: PPO | Admitting: Nurse Practitioner

## 2018-06-03 DIAGNOSIS — R609 Edema, unspecified: Secondary | ICD-10-CM

## 2018-06-03 DIAGNOSIS — M7751 Other enthesopathy of right foot: Secondary | ICD-10-CM | POA: Insufficient documentation

## 2018-06-03 DIAGNOSIS — F5105 Insomnia due to other mental disorder: Secondary | ICD-10-CM | POA: Diagnosis not present

## 2018-06-03 DIAGNOSIS — F419 Anxiety disorder, unspecified: Secondary | ICD-10-CM

## 2018-06-03 NOTE — Assessment & Plan Note (Addendum)
Clinically presumed. Top of the midfoot on the right, no pain, no ulceration, avoid pressure, better fit shoes to avoid skin breakdown. Watchful waiting.

## 2018-06-03 NOTE — Assessment & Plan Note (Signed)
Prn lorazepam 0.5mg  bid available to her, benefit, risk, alternative discussed with the patient, she expressed her understanding of the medication.

## 2018-06-03 NOTE — Progress Notes (Addendum)
Location:   clinic [place]   Place of Service:  Clinic (12) Provider: Marlana Latus NP  Code Status: DNR Goals of Care: [place] Advanced Directives 06/18/2017  Does Patient Have a Medical Advance Directive? Yes  Type of Paramedic of [place];Living will  Does patient want to make changes to medical advance directive? No - Patient declined  Copy of [place] in Chart? Yes     Chief Complaint  Patient presents with  . Acute Visit    (R) foot conerns    HPI: Patient is a 84 y.o. female seen today for an acute visit for concern of the top of the right foot bump, no pain, no ulceration of the skin. Duration is uncertain. The patient stated she is here because of PT told her to do so. She feels her anxiety is better, only needs occasion Lorazepam, its effective.   Past Medical History:  Diagnosis Date  . GERD (gastroesophageal reflux disease)   . Hx of cataract surgery    both eyes  . Hyperglycemia   . Hyperlipidemia   . Hypertension   . Lumbago 04/05/2016  . Macular degeneration    ? which eye(s)  . Obstructive sleep apnea   . Osteoporosis, senile   . Overactive bladder   . Squamous cell skin cancer 2/16   left lower leg    Past Surgical History:  Procedure Laterality Date  . ABDOMINAL HYSTERECTOMY  1960's  . APPENDECTOMY  1960's  . MOLE REMOVAL  2000   facial  . TONSILLECTOMY      Allergies  Allergen Reactions  . Adhesive [Tape]   . Ampicillin   . Nitrofuran Derivatives   . Sulfur   . Zoloft [Sertraline Hcl] Other (See Comments)    Dizziness, nausea, faint feeling, and nightmares      Allergies as of 06/03/2018      Reactions   Adhesive [tape]    Ampicillin    Nitrofuran Derivatives    Sulfur    Zoloft [sertraline Hcl] Other (See Comments)   Dizziness, nausea, faint feeling, and nightmares        Medication List        Accurate as of 06/03/18 11:59 PM. Always use your most recent med list.          acetaminophen  325 MG tablet Commonly known as:  TYLENOL Take 650 mg by mouth daily.   aspirin EC 81 MG tablet Take 1 tablet (81 mg total) by mouth daily.   BIOFREEZE EX Apply 1 application topically as needed.   CALCIUM 600+D3 600-800 MG-UNIT Tabs Generic drug:  Calcium Carb-Cholecalciferol Take by mouth. Take one daily   ferrous sulfate 325 (65 FE) MG tablet Take 1 tablet (325 mg total) daily with breakfast by mouth.   Fish Oil 1200 MG Caps Take by mouth. Take one daily   ICAPS Caps Take by mouth. Take one I-Cap daily for eyes   loratadine 10 MG tablet Commonly known as:  CLARITIN Take 10 mg by mouth daily.   LORazepam 0.5 MG tablet Commonly known as:  ATIVAN TAKE ONE TABLET ONCE DAILY AS NEEDED FOR ANXIETY.   losartan-hydrochlorothiazide 100-12.5 MG tablet Commonly known as:  HYZAAR Take 1 tablet by mouth daily.   omeprazole 40 MG capsule Commonly known as:  PRILOSEC TAKE 1 CAPSULE DAILY 1/2 HOUR BEFORE BREAKFAST FOR STOMACH ACID.   oxybutynin 10 MG 24 hr tablet Commonly known as:  DITROPAN XL Take 1 tablet (10 mg  total) by mouth at bedtime.   SHINGRIX IM Inject 0.5 mLs into the muscle once. 2 doses to be provided, give the 1st dose followed by 2nd dose at least 2 months.   SYSTANE 0.4-0.3 % Gel ophthalmic gel Generic drug:  Polyethyl Glycol-Propyl Glycol Place 1 application into both eyes 3 (three) times daily.   traMADol 50 MG tablet Commonly known as:  ULTRAM Take 1 tablet (50 mg total) by mouth every 6 (six) hours as needed for severe pain. Pt has this medication at home- no new supply provided.       Review of Systems:  Review of Systems  Constitutional: Negative for activity change, appetite change, chills, diaphoresis and fatigue.  HENT: Positive for hearing loss. Negative for congestion, trouble swallowing and voice change.   Respiratory: Negative for cough, shortness of breath and wheezing.   Cardiovascular: Positive for leg swelling. Negative for chest pain  and palpitations.  Gastrointestinal: Negative for abdominal distention and abdominal pain.  Genitourinary: Negative for difficulty urinating, dysuria and urgency.  Musculoskeletal: Positive for gait problem.  Skin: Negative for color change and pallor.       Brownish pigmented BLE, lots of moles.   Neurological: Positive for numbness. Negative for speech difficulty, weakness and headaches.       Memory lapses. Transit numbness BLE sometimes.   Psychiatric/Behavioral: Negative for agitation, behavioral problems, confusion, hallucinations and sleep disturbance. The patient is nervous/anxious.     Health Maintenance  Topic Date Due  . DEXA SCAN  08/04/2018 (Originally 01/23/1991)  . INFLUENZA VACCINE  07/29/2018  . TETANUS/TDAP  12/14/2022  . PNA vac Low Risk Adult  Completed    Physical Exam: Vitals:   06/03/18 1406  BP: 122/80  Pulse: 82  Resp: 20  Temp: 98.7 F (37.1 C)  SpO2: 96%  Weight: 191 lb 12.8 oz (87 kg)  Height: 5\' 6"  (1.676 m)   Body mass index is 30.96 kg/m. Physical Exam  Constitutional: She appears well-developed and well-nourished.  HENT:  Head: Normocephalic and atraumatic.  Eyes: Pupils are equal, round, and reactive to light. EOM are normal.  Neck: Normal range of motion. Neck supple. No JVD present. No thyromegaly present.  Cardiovascular: Normal rate and regular rhythm.  No murmur heard. Diminished DP pulses R+L.   Pulmonary/Chest: Effort normal and breath sounds normal. She has no wheezes. She has no rales.  Musculoskeletal: She exhibits edema.  Trace edema BLE, ambulates with walker. The top of the right foot spur, no pain.   Neurological: She is alert. No cranial nerve deficit. She exhibits normal muscle tone. Coordination normal.  Oriented to person and place.   Skin: Skin is warm and dry. No erythema. No pallor.  Hemosiderin deposits R+L shins.    Psychiatric: She has a normal mood and affect. Her behavior is normal. Judgment and thought content  normal.    Labs reviewed: Basic Metabolic Panel: Recent Labs    07/06/17 1542 01/25/18 0000 05/04/18 0655  NA 140 141 139  K 3.6 4.3 3.8  CL 104 104 104  CO2 27 29 29   GLUCOSE 99 119* 101*  BUN 23 22 21   CREATININE 0.86 0.78 0.90*  CALCIUM 9.4 10.0 9.9  TSH  --  4.27  --    Liver Function Tests: Recent Labs    07/06/17 1542 01/25/18 0000  AST 20 32  ALT 13 26  ALKPHOS 59  --   BILITOT 0.5 0.6  PROT 5.7* 6.3  ALBUMIN 3.8  --  No results for input(s): LIPASE, AMYLASE in the last 8760 hours. No results for input(s): AMMONIA in the last 8760 hours. CBC: Recent Labs    07/20/17 0819 10/13/17 01/25/18 0000 05/04/18 0655  WBC 6.5 6.2 7.1 6.6  HGB 10.9* 12.3 13.1 13.3  HCT 33.7* 36 39.2 38.9  MCV 88.0  --  91.6 91.3  PLT 219 180 194 194   Lipid Panel: Recent Labs    01/25/18 0000  CHOL 190  HDL 51  LDLCALC 118*  TRIG 107  CHOLHDL 3.7   Lab Results  Component Value Date   HGBA1C 5.4 05/04/2018    Procedures since last visit: No results found.  Assessment/Plan Bone spur of right foot Clinically presumed. Top of the midfoot on the right, no pain, no ulceration, avoid pressure, better fit shoes to avoid skin breakdown. Watchful waiting.   Edema Trace, continue compression hosiery daily.   Insomnia secondary to anxiety Prn lorazepam 0.5mg  bid available to her, benefit, risk, alternative discussed with the patient, she expressed her understanding of the medication.     Labs/tests ordered:  None  Next appt:  07/20/2018  Time spend 25 minutes

## 2018-06-03 NOTE — Patient Instructions (Signed)
Watchful waiting for the top of right foot bone spur, may consider Podiatry consultation if pain develops.

## 2018-06-03 NOTE — Assessment & Plan Note (Signed)
Trace, continue compression hosiery daily.

## 2018-06-04 ENCOUNTER — Encounter: Payer: Self-pay | Admitting: Nurse Practitioner

## 2018-06-04 MED ORDER — LORAZEPAM 0.5 MG PO TABS: ORAL_TABLET | ORAL | 0 refills | Status: DC

## 2018-06-28 DIAGNOSIS — M545 Low back pain: Secondary | ICD-10-CM | POA: Diagnosis not present

## 2018-06-28 DIAGNOSIS — D049 Carcinoma in situ of skin, unspecified: Secondary | ICD-10-CM | POA: Diagnosis not present

## 2018-06-28 DIAGNOSIS — N3281 Overactive bladder: Secondary | ICD-10-CM | POA: Diagnosis not present

## 2018-06-28 DIAGNOSIS — R42 Dizziness and giddiness: Secondary | ICD-10-CM | POA: Diagnosis not present

## 2018-06-28 DIAGNOSIS — R2681 Unsteadiness on feet: Secondary | ICD-10-CM | POA: Diagnosis not present

## 2018-06-28 DIAGNOSIS — M6281 Muscle weakness (generalized): Secondary | ICD-10-CM | POA: Diagnosis not present

## 2018-07-02 ENCOUNTER — Other Ambulatory Visit: Payer: Self-pay | Admitting: Internal Medicine

## 2018-07-05 ENCOUNTER — Telehealth: Payer: Self-pay

## 2018-07-05 NOTE — Telephone Encounter (Signed)
Left a message for Mrs. Brazeau stating that if her forms needed to be updated that she can take it down to the clinic for Ivin Booty to fax to our office. I also asked what does she mean by "messy". I advised Mrs. Skalsky that all her other concerns can be addressed by Delanna Notice, RN [place] because she has access to the information that she is requesting.   Spoke with Elijah Birk, RMA to let her know about Mrs. Livingood coming down with any type of paperwork that needs to be faxed over to the office.

## 2018-07-05 NOTE — Telephone Encounter (Signed)
Jean [name] called stating that she has an appointment on 07/14/18 for a ENT doctor that her son has scheduled but she wanted to know to could you refer her to someone who works with her insurance which is [employer]. Please advise.

## 2018-07-05 NOTE — Telephone Encounter (Signed)
Spoke with Jean [name] and she stated that she would call her son and have him help her with finding a ENT doctor that accepts her insurance.

## 2018-07-05 NOTE — Telephone Encounter (Signed)
I do not know which ENT provided would accept her insurance plan. It would be best to contact her insurance company and they can provide a list of providers who would accept her insurance. Please inform patient.

## 2018-07-05 NOTE — Telephone Encounter (Signed)
Patient called clinical intake requesting to speak with [place]. I informed patient that I will take message and send to [place] and the patient then asked for a number to contact [place] directly. After several attempts of reassuring patient that I will make sure [place] gets her message patient stated she needs some of her emergency forms updated.  Patient states she has an emergency envelope on the inside of her door per facility policy and she needs some of her forms updated for they appear "messy": Patient states she needs a new medication list and a new [employer] manager.   Patient states she has to go to PT today and it is ok for [place] to leave her a detailed message on voicemail  Forwarded to [place] Duff/RMA

## 2018-07-14 DIAGNOSIS — H903 Sensorineural hearing loss, bilateral: Secondary | ICD-10-CM | POA: Diagnosis not present

## 2018-07-19 ENCOUNTER — Other Ambulatory Visit: Payer: Self-pay | Admitting: Internal Medicine

## 2018-07-20 ENCOUNTER — Encounter: Payer: Self-pay | Admitting: Internal Medicine

## 2018-07-20 ENCOUNTER — Non-Acute Institutional Stay: Payer: PPO | Admitting: Internal Medicine

## 2018-07-20 VITALS — BP 130/60 | HR 71 | Temp 98.4°F | Resp 18 | Ht 66.0 in | Wt 183.0 lb

## 2018-07-20 DIAGNOSIS — N3281 Overactive bladder: Secondary | ICD-10-CM

## 2018-07-20 DIAGNOSIS — Z0000 Encounter for general adult medical examination without abnormal findings: Secondary | ICD-10-CM | POA: Diagnosis not present

## 2018-07-20 DIAGNOSIS — G4733 Obstructive sleep apnea (adult) (pediatric): Secondary | ICD-10-CM

## 2018-07-20 DIAGNOSIS — E663 Overweight: Secondary | ICD-10-CM | POA: Diagnosis not present

## 2018-07-20 DIAGNOSIS — E2839 Other primary ovarian failure: Secondary | ICD-10-CM

## 2018-07-20 DIAGNOSIS — E782 Mixed hyperlipidemia: Secondary | ICD-10-CM

## 2018-07-20 DIAGNOSIS — M5440 Lumbago with sciatica, unspecified side: Secondary | ICD-10-CM | POA: Diagnosis not present

## 2018-07-20 DIAGNOSIS — I10 Essential (primary) hypertension: Secondary | ICD-10-CM | POA: Diagnosis not present

## 2018-07-20 DIAGNOSIS — D509 Iron deficiency anemia, unspecified: Secondary | ICD-10-CM

## 2018-07-20 DIAGNOSIS — H04123 Dry eye syndrome of bilateral lacrimal glands: Secondary | ICD-10-CM | POA: Diagnosis not present

## 2018-07-20 DIAGNOSIS — G8929 Other chronic pain: Secondary | ICD-10-CM

## 2018-07-20 DIAGNOSIS — K219 Gastro-esophageal reflux disease without esophagitis: Secondary | ICD-10-CM

## 2018-07-20 DIAGNOSIS — J309 Allergic rhinitis, unspecified: Secondary | ICD-10-CM

## 2018-07-20 DIAGNOSIS — Z7189 Other specified counseling: Secondary | ICD-10-CM | POA: Diagnosis not present

## 2018-07-20 NOTE — Progress Notes (Signed)
[facility]  Provider: Blanchie Serve MD   Location:  [place] of Service:  Clinic (12)  PCP: Blanchie Serve, MD Patient Care Team: Blanchie Serve, MD as PCP - General (Internal Medicine) Mast, Man X, NP as Nurse Practitioner (Internal Medicine)  Extended Emergency Contact Information Primary Emergency Contact: [facility] Address: [address]          [place], [place] [address] Johnnette Litter of [place] Phone: [phone] Mobile Phone: [phone] Relation: Son  Code Status: full code  Goals of Care: Advanced Directive information Advanced Directives 07/20/2018  Does Patient Have a Medical Advance Directive? Yes  Type of Advance Directive Living will  Does patient want to make changes to medical advance directive? No - Patient declined  Copy of [place] in Chart? -      Chief Complaint  Patient presents with  . Annual Exam    annual wellness visit  . Medication Refill    No refills neede at this time  . MMSE    28/30. Passed clock drawing     HPI: Patient is a 84 y.o. female seen today for her physical exam.   Lumbago- pain to lower back radiating to both legs, uses walker for ambulation, tylenol helps most of the times, has required tramadol only on 2 occasion. No fall reported.   Dry eyes- takes systane eye drops, wears eyeglasses  OAB- Has ongoing increased urinary frequency. Wears pad for her incontinence. Denies symptom of UTI. Takes ditropan 10 mg daily. Has dry eyes ad occasional dry mouth.   GERD- takes omeprazole and denies reflux symptom today, has occasional symptom.   Hyperlipidemia- has been trying to follow a strict diet, taking fish oil, has lost weight.   Hypertension- taking losartan-hctz 100-12.5 mg daily and baby aspirin  Allergic rhinitis- controlled with claritin daily, attempted changing to prn before and symptoms worsened  Past Medical History:  Diagnosis Date  . GERD  (gastroesophageal reflux disease)   . Hx of cataract surgery    both eyes  . Hyperglycemia   . Hyperlipidemia   . Hypertension   . Lumbago 04/05/2016  . Macular degeneration    ? which eye(s)  . Obstructive sleep apnea   . Osteoporosis, senile   . Overactive bladder   . Squamous cell skin cancer 2/16   left lower leg   Past Surgical History:  Procedure Laterality Date  . ABDOMINAL HYSTERECTOMY  1960's  . APPENDECTOMY  1960's  . MOLE REMOVAL  2000   facial  . TONSILLECTOMY      reports that she has never smoked. She has never used smokeless tobacco. She reports that she drinks alcohol. She reports that she does not use drugs. Social History   Socioeconomic History  . Marital status: Widowed    Spouse name: Not on file  . Number of children: Not on file  . Years of education: Not on file  . Highest education level: Not on file  Occupational History  . 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 Financial resource strain: Not on file  . Food insecurity:    Worry: Not on file    Inability: Not on file  . Transportation needs:    Medical: Not on file    Non-medical: Not on file  Tobacco Use  . Smoking status: Never Smoker  . Smokeless tobacco: Never Used  Substance and Sexual Activity  . Alcohol use: Yes  Comment: occassionally 1/2 glass of wine 1-2 x month  . Drug use: No  . Sexual activity: Never  Lifestyle  . Physical activity:    Days per week: Not on file    Minutes per session: Not on file  . Stress: Not on file  Relationships  . Social connections:    Talks on phone: Not on file    Gets together: Not on file    Attends religious service: Not on file    Active member of club or organization: Not on file    Attends meetings of clubs or organizations: Not on file    Relationship status: Not on file  . Intimate partner violence:    Fear of current or ex partner: Not on file    Emotionally abused: Not on file    Physically abused: Not on file      Forced sexual activity: Not on file  Other Topics Concern  . Not on file  Social History Narrative   Lives at [facility] since 11/12/15   Widow   Never smoked   Alcohol occasionally 1/2 glass of wine   Exercise none   POA, Living Will   Walks with walker    Functional Status Survey:    Family History  Problem Relation Age of Onset  . Heart disease Mother   . Heart disease Father     Health Maintenance  Topic Date Due  . DEXA SCAN  08/04/2018 (Originally 01/23/1991)  . INFLUENZA VACCINE  07/29/2018  . TETANUS/TDAP  12/14/2022  . PNA vac Low Risk Adult  Completed    Allergies  Allergen Reactions  . Adhesive [Tape]   . Ampicillin   . Nitrofuran Derivatives   . Sulfur   . Zoloft [Sertraline Hcl] Other (See Comments)    Dizziness, nausea, faint feeling, and nightmares      Outpatient Encounter Medications as of 07/20/2018  Medication Sig  . acetaminophen (TYLENOL) 325 MG tablet Take 650 mg by mouth daily.  Marland Kitchen aspirin EC 81 MG tablet Take 1 tablet (81 mg total) by mouth daily.  . Calcium Carb-Cholecalciferol (CALCIUM 600+D3) 600-800 MG-UNIT TABS Take by mouth. Take one daily  . ferrous sulfate 325 (65 FE) MG tablet Take 1 tablet (325 mg total) daily with breakfast by mouth.  . fexofenadine (ALLEGRA) 180 MG tablet Take 180 mg by mouth daily.  . fluticasone (FLONASE) 50 MCG/ACT nasal spray Place 1 spray into both nostrils daily.  Marland Kitchen LORazepam (ATIVAN) 0.5 MG tablet TAKE ONE TABLET ONCE DAILY AS NEEDED FOR ANXIETY.  Marland Kitchen losartan-hydrochlorothiazide (HYZAAR) 100-12.5 MG tablet TAKE (1) TABLET DAILY FOR HIGH BLOOD PRESSURE.  . Menthol, Topical Analgesic, (BIOFREEZE EX) Apply 1 application topically as needed.  . Multiple Vitamins-Minerals (ICAPS) CAPS Take by mouth. Take one I-Cap daily for eyes  . Omega-3 Fatty Acids (FISH OIL) 1200 MG CAPS Take by mouth. Take one daily  . omeprazole (PRILOSEC) 40 MG capsule TAKE 1 CAPSULE DAILY 1/2 HOUR BEFORE BREAKFAST FOR STOMACH  ACID.  Marland Kitchen oxybutynin (DITROPAN XL) 10 MG 24 hr tablet Take 1 tablet (10 mg total) by mouth at bedtime.  Vladimir Faster Glycol-Propyl Glycol (SYSTANE) 0.4-0.3 % GEL ophthalmic gel Place 1 application into both eyes 3 (three) times daily.   . traMADol (ULTRAM) 50 MG tablet Take 1 tablet (50 mg total) by mouth every 6 (six) hours as needed for severe pain. Pt has this medication at home- no new supply provided.  . Zoster Vac Recomb Adjuvanted (SHINGRIX IM)  Inject 0.5 mLs into the muscle once. 2 doses to be provided, give the 1st dose followed by 2nd dose at least 2 months.  . loratadine (CLARITIN) 10 MG tablet Take 10 mg by mouth daily.   No facility-administered encounter medications on file as of 07/20/2018.     Review of Systems  Constitutional: Negative for appetite change, chills and fever.  HENT: Positive for hearing loss and rhinorrhea. Negative for congestion, ear discharge, ear pain, postnasal drip, sinus pressure and trouble swallowing.   Eyes: Positive for visual disturbance. Negative for pain, discharge and itching.  Respiratory: Negative for cough, choking, shortness of breath and wheezing.   Cardiovascular: Positive for leg swelling. Negative for chest pain and palpitations.  Gastrointestinal: Negative for abdominal pain, blood in stool, constipation, diarrhea, nausea, rectal pain and vomiting.  Genitourinary: Positive for frequency and urgency. Negative for dysuria, flank pain, hematuria and pelvic pain.  Musculoskeletal: Positive for arthralgias, back pain and gait problem.       Unsteady gait  Skin: Negative for rash and wound.  Neurological: Positive for numbness. Negative for dizziness, light-headedness and headaches.  Hematological: Bruises/bleeds easily.  Psychiatric/Behavioral: Negative for behavioral problems, confusion and sleep disturbance. The patient is nervous/anxious.     Vitals:   07/20/18 0954  BP: 130/60  Pulse: 71  Resp: 18  Temp: 98.4 F (36.9 C)  TempSrc:  Oral  SpO2: 93%  Weight: 183 lb (83 kg)  Height: 5' 6" (1.676 m)   Body mass index is 29.54 kg/m.   Wt Readings from Last 3 Encounters:  07/20/18 183 lb (83 kg)  06/03/18 191 lb 12.8 oz (87 kg)  02/16/18 182 lb 12.8 oz (82.9 kg)   Physical Exam  Constitutional: She is oriented to person, place, and time. No distress.  Obese, elderly female  HENT:  Head: Normocephalic and atraumatic.  Right Ear: External ear normal.  Left Ear: External ear normal.  Nose: Nose normal.  Mouth/Throat: Oropharynx is clear and moist. No oropharyngeal exudate.  Eyes: Pupils are equal, round, and reactive to light. Conjunctivae and EOM are normal. Right eye exhibits no discharge. Left eye exhibits no discharge.  Neck: Normal range of motion. Neck supple.  Cardiovascular: Normal rate and regular rhythm.  Pulmonary/Chest: Effort normal and breath sounds normal. She has no wheezes. She has no rales.  Abdominal: Soft. Bowel sounds are normal. There is no tenderness. There is no rebound and no guarding.  Musculoskeletal: She exhibits edema.  Trace leg edema, can move all 4 extremities, limited ROM with hip, kyphosis present, uses walker  Lymphadenopathy:    She has no cervical adenopathy.  Neurological: She is alert and oriented to person, place, and time. She exhibits normal muscle tone.  07/20/18 MMSE 28/30, passed clock draw  Skin: Skin is warm and dry. She is not diaphoretic.  Multiple hyperpigmented spots to both arms and face  Psychiatric: She has a normal mood and affect. Her behavior is normal.    Labs reviewed: Basic Metabolic Panel: Recent Labs    01/25/18 0000 05/04/18 0655  NA 141 139  K 4.3 3.8  CL 104 104  CO2 29 29  GLUCOSE 119* 101*  BUN 22 21  CREATININE 0.78 0.90*  CALCIUM 10.0 9.9   Liver Function Tests: Recent Labs    01/25/18 0000  AST 32  ALT 26  BILITOT 0.6  PROT 6.3   No results for input(s): LIPASE, AMYLASE in the last 8760 hours. No results for input(s):  AMMONIA in the  last 8760 hours. CBC: Recent Labs    10/13/17 01/25/18 0000 05/04/18 0655  WBC 6.2 7.1 6.6  HGB 12.3 13.1 13.3  HCT 36 39.2 38.9  MCV  --  91.6 91.3  PLT 180 194 194   Cardiac Enzymes: No results for input(s): CKTOTAL, CKMB, CKMBINDEX, TROPONINI in the last 8760 hours. BNP: Invalid input(s): POCBNP Lab Results  Component Value Date   HGBA1C 5.4 05/04/2018   Lab Results  Component Value Date   TSH 4.27 01/25/2018   Lab Results  Component Value Date   VITAMINB12 420 07/20/2017   Lab Results  Component Value Date   FOLATE 13.5 07/20/2017   Lab Results  Component Value Date   FERRITIN 34 01/25/2018    Lipid Panel: Recent Labs    01/25/18 0000  CHOL 190  HDL 51  LDLCALC 118*  TRIG 107  CHOLHDL 3.7   Lab Results  Component Value Date   HGBA1C 5.4 05/04/2018    Procedures since last visit: No results found.   07/20/18 EKG- sinus rhythm, HR 70 bpm, QRS 114 ms, incomplete RBBB and left anterior fascicular block, compared to EKG 09/23/16 no new changes  Assessment/Plan  1. Chronic bilateral low back pain with sciatica, sciatica laterality unspecified Continue tylenol for now and prn tramadol, fall precautions - CMP with eGFR([facility]); Future  2. Dry eyes, bilateral Continue systane eye drops  3. Essential hypertension Continue losartan-hctz with daily baby aspirin, controlled BP - CMP with eGFR([facility]); Future - Lipid Panel; Future - CBC (no diff); Future - TSH; Future  4. Obstructive sleep apnea Continue CPAP - CMP with eGFR([facility]); Future - TSH; Future  5. Allergic rhinitis, unspecified seasonality, unspecified trigger Continue fexofenadine and claritin for now  6. Gastroesophageal reflux disease without esophagitis Continue omeprazole for now - TSH; Future  7. OAB (overactive bladder) Continue oxybutynin, tolerating well  8. Mixed hyperlipidemia Continue fish oil - Lipid Panel; Future - TSH; Future  9. Iron deficiency  anemia, unspecified iron deficiency anemia type Continue ferrous sulfate - CBC (no diff); Future  10. Overweight (BMI 25.0-29.9) Has lost some weight, continue fish oil, walking for exercise as tolerated.  - Lipid Panel; Future - TSH; Future  11. Goals of care, counseling/discussion Brought her living will, copy made for records. She is full code at present. Went over MOST form, pt would like to review it at home will family first. A form provided for review.   12. Annual physical exam the patient was counseled regarding prevention of dental and periodontal disease, diet, regular sustained exercise for at least 30 minutes 5 times per week, the proper use of sunscreen and protective clothing, and recommended schedule for cholesterol, thyroid and diabetes screening.  13. Estrogen deficiency dexa scan. Continue calcium and vit d supplement   Labs/tests ordered:   Lab Orders     CMP with eGFR([facility])     Lipid Panel     CBC (no diff)     TSH   Next appointment: 3 months with [facility]  Communication: reviewed care plan with patient     Blanchie Serve, MD Internal Medicine [facility] [place], [place] [address] Cell Phone (Monday-Friday 8 am - 5 pm): [phone] On Call: [phone] and follow prompts after 5 pm and on weekends Office Phone: [phone] Office Fax: [phone]

## 2018-07-28 DIAGNOSIS — H903 Sensorineural hearing loss, bilateral: Secondary | ICD-10-CM | POA: Diagnosis not present

## 2018-07-28 DIAGNOSIS — H9113 Presbycusis, bilateral: Secondary | ICD-10-CM | POA: Diagnosis not present

## 2018-08-11 ENCOUNTER — Encounter: Payer: Self-pay | Admitting: Internal Medicine

## 2018-08-24 ENCOUNTER — Other Ambulatory Visit: Payer: Self-pay

## 2018-08-24 DIAGNOSIS — G4733 Obstructive sleep apnea (adult) (pediatric): Secondary | ICD-10-CM

## 2018-08-24 DIAGNOSIS — I10 Essential (primary) hypertension: Secondary | ICD-10-CM

## 2018-08-24 DIAGNOSIS — E663 Overweight: Secondary | ICD-10-CM

## 2018-08-24 DIAGNOSIS — D509 Iron deficiency anemia, unspecified: Secondary | ICD-10-CM

## 2018-08-24 DIAGNOSIS — M5440 Lumbago with sciatica, unspecified side: Secondary | ICD-10-CM

## 2018-08-24 DIAGNOSIS — G8929 Other chronic pain: Secondary | ICD-10-CM

## 2018-08-24 DIAGNOSIS — E782 Mixed hyperlipidemia: Secondary | ICD-10-CM

## 2018-08-24 DIAGNOSIS — K219 Gastro-esophageal reflux disease without esophagitis: Secondary | ICD-10-CM

## 2018-09-03 DIAGNOSIS — G4733 Obstructive sleep apnea (adult) (pediatric): Secondary | ICD-10-CM | POA: Diagnosis not present

## 2018-09-14 ENCOUNTER — Other Ambulatory Visit: Payer: Self-pay | Admitting: *Deleted

## 2018-09-14 MED ORDER — ZOSTER VAC RECOMB ADJUVANTED 50 MCG/0.5ML IM SUSR: 0.5000 mL | Freq: Once | INTRAMUSCULAR | 1 refills | Status: AC

## 2018-09-14 NOTE — Telephone Encounter (Signed)
Patient called requesting an updated Shingles vaccine Rx to be faxed to [facility]. Stated that the Rx she has is too old dated January 2019.  Faxed Rx.

## 2018-09-21 ENCOUNTER — Ambulatory Visit
Admission: RE | Admit: 2018-09-21 | Discharge: 2018-09-21 | Disposition: A | Discharge status: Home / Self Care | Payer: PPO | Source: Ambulatory Visit | Attending: Internal Medicine | Admitting: Internal Medicine

## 2018-09-21 DIAGNOSIS — Z780 Asymptomatic menopausal state: Secondary | ICD-10-CM | POA: Diagnosis not present

## 2018-09-21 DIAGNOSIS — E2839 Other primary ovarian failure: Secondary | ICD-10-CM

## 2018-09-21 DIAGNOSIS — M85852 Other specified disorders of bone density and structure, left thigh: Secondary | ICD-10-CM | POA: Diagnosis not present

## 2018-10-11 ENCOUNTER — Other Ambulatory Visit: Payer: Self-pay | Admitting: Nurse Practitioner

## 2018-10-19 ENCOUNTER — Other Ambulatory Visit: Payer: Self-pay | Admitting: Nurse Practitioner

## 2018-10-20 DIAGNOSIS — I10 Essential (primary) hypertension: Secondary | ICD-10-CM | POA: Diagnosis not present

## 2018-10-20 DIAGNOSIS — G4733 Obstructive sleep apnea (adult) (pediatric): Secondary | ICD-10-CM | POA: Diagnosis not present

## 2018-10-20 DIAGNOSIS — E782 Mixed hyperlipidemia: Secondary | ICD-10-CM | POA: Diagnosis not present

## 2018-10-20 DIAGNOSIS — E663 Overweight: Secondary | ICD-10-CM | POA: Diagnosis not present

## 2018-10-21 ENCOUNTER — Other Ambulatory Visit: Payer: PPO

## 2018-10-21 DIAGNOSIS — K219 Gastro-esophageal reflux disease without esophagitis: Secondary | ICD-10-CM

## 2018-10-21 DIAGNOSIS — G4733 Obstructive sleep apnea (adult) (pediatric): Secondary | ICD-10-CM

## 2018-10-21 DIAGNOSIS — E663 Overweight: Secondary | ICD-10-CM

## 2018-10-21 DIAGNOSIS — D509 Iron deficiency anemia, unspecified: Secondary | ICD-10-CM

## 2018-10-21 DIAGNOSIS — M5440 Lumbago with sciatica, unspecified side: Secondary | ICD-10-CM

## 2018-10-21 DIAGNOSIS — I10 Essential (primary) hypertension: Secondary | ICD-10-CM

## 2018-10-21 DIAGNOSIS — G8929 Other chronic pain: Secondary | ICD-10-CM

## 2018-10-21 DIAGNOSIS — E782 Mixed hyperlipidemia: Secondary | ICD-10-CM

## 2018-10-21 LAB — CBC
HCT: 35.7 % (ref 35.0–45.0)
HEMOGLOBIN: 12 g/dL (ref 11.7–15.5)
MCH: 31.9 pg (ref 27.0–33.0)
MCHC: 33.6 g/dL (ref 32.0–36.0)
MCV: 94.9 fL (ref 80.0–100.0)
MPV: 10 fL (ref 7.5–12.5)
Platelets: 178 10*3/uL (ref 140–400)
RBC: 3.76 10*6/uL — AB (ref 3.80–5.10)
RDW: 12.5 % (ref 11.0–15.0)
WBC: 6.5 10*3/uL (ref 3.8–10.8)

## 2018-10-21 LAB — COMPLETE METABOLIC PANEL WITH GFR
AG Ratio: 2.1 (calc) (ref 1.0–2.5)
ALBUMIN MSPROF: 4 g/dL (ref 3.6–5.1)
ALKALINE PHOSPHATASE (APISO): 68 U/L (ref 33–130)
ALT: 21 U/L (ref 6–29)
AST: 23 U/L (ref 10–35)
BUN / CREAT RATIO: 36 (calc) — AB (ref 6–22)
BUN: 29 mg/dL — AB (ref 7–25)
CALCIUM: 9.6 mg/dL (ref 8.6–10.4)
CO2: 25 mmol/L (ref 20–32)
CREATININE: 0.81 mg/dL (ref 0.60–0.88)
Chloride: 105 mmol/L (ref 98–110)
GFR, EST NON AFRICAN AMERICAN: 63 mL/min/{1.73_m2} (ref >=60)
GFR, Est African American: 73 mL/min/{1.73_m2} (ref >=60)
GLOBULIN: 1.9 g/dL (ref 1.9–3.7)
GLUCOSE: 116 mg/dL — AB (ref 65–99)
Potassium: 3.9 mmol/L (ref 3.5–5.3)
SODIUM: 139 mmol/L (ref 135–146)
Total Bilirubin: 0.4 mg/dL (ref 0.2–1.2)
Total Protein: 5.9 g/dL — ABNORMAL LOW (ref 6.1–8.1)

## 2018-10-21 LAB — LIPID PANEL
CHOLESTEROL: 171 mg/dL (ref <200)
HDL: 40 mg/dL — AB (ref >50)
LDL CHOLESTEROL (CALC): 110 mg/dL — AB
NON-HDL CHOLESTEROL (CALC): 131 mg/dL — AB (ref <130)
Total CHOL/HDL Ratio: 4.3 (calc) (ref <5.0)
Triglycerides: 103 mg/dL (ref <150)

## 2018-10-21 LAB — TSH: TSH: 3.26 m[IU]/L (ref 0.40–4.50)

## 2018-10-27 DIAGNOSIS — N8110 Cystocele, unspecified: Secondary | ICD-10-CM | POA: Diagnosis not present

## 2018-10-28 ENCOUNTER — Other Ambulatory Visit: Payer: Self-pay | Admitting: Nurse Practitioner

## 2018-10-28 ENCOUNTER — Encounter: Payer: Self-pay | Admitting: Nurse Practitioner

## 2018-10-28 ENCOUNTER — Non-Acute Institutional Stay: Payer: PPO | Admitting: Nurse Practitioner

## 2018-10-28 DIAGNOSIS — M5440 Lumbago with sciatica, unspecified side: Secondary | ICD-10-CM

## 2018-10-28 DIAGNOSIS — K219 Gastro-esophageal reflux disease without esophagitis: Secondary | ICD-10-CM | POA: Diagnosis not present

## 2018-10-28 DIAGNOSIS — I10 Essential (primary) hypertension: Secondary | ICD-10-CM | POA: Diagnosis not present

## 2018-10-28 DIAGNOSIS — J309 Allergic rhinitis, unspecified: Secondary | ICD-10-CM

## 2018-10-28 DIAGNOSIS — N3281 Overactive bladder: Secondary | ICD-10-CM | POA: Diagnosis not present

## 2018-10-28 DIAGNOSIS — G8929 Other chronic pain: Secondary | ICD-10-CM

## 2018-10-28 DIAGNOSIS — F419 Anxiety disorder, unspecified: Secondary | ICD-10-CM | POA: Diagnosis not present

## 2018-10-28 DIAGNOSIS — E782 Mixed hyperlipidemia: Secondary | ICD-10-CM | POA: Diagnosis not present

## 2018-10-28 DIAGNOSIS — D509 Iron deficiency anemia, unspecified: Secondary | ICD-10-CM | POA: Diagnosis not present

## 2018-10-28 NOTE — Assessment & Plan Note (Signed)
Her mood is managed, continue Lorazepam 0.5mg  daily prn.

## 2018-10-28 NOTE — Progress Notes (Signed)
Location:   clinic [place]   Place of Service:  Clinic (12)clinic [facility]  Provider: Marlana Latus NP  Code Status: DNR Goals of Care: [place] Advanced Directives 07/20/2018  Does Patient Have a Medical Advance Directive? Yes  Type of Advance Directive Living will  Does patient want to make changes to medical advance directive? No - Patient declined  Copy of [place] in Chart? -     Chief Complaint  Patient presents with  . Medical Management of Chronic Issues    3 mo f/u    HPI: Patient is a 84 y.o. female seen today for medical management of chronic diseases.    The patient has history of multiple sites of OA, pain is manage with Tylenol 650mg  qd and Tramadol 50mg  q6h prn. Urinary frequency is managed on Oxybutynin 10mg  qd. GERD, stable, on Omeprazole 40mg  qd. HTN, blood pressure is controlled on Losartan/HCTZ 100/12.5mg  qd. Her mood is managed with Lorazepam 0.5mg  daily prn. Allergic rhinitis, stable on Loratadine 10mg  qd, Fluticasone nasal spray daily, Allegra 180mg  qd. Anemia, on Fe, last Hgb 12.0 1023/19  Past Medical History:  Diagnosis Date  . GERD (gastroesophageal reflux disease)   . Hx of cataract surgery    both eyes  . Hyperglycemia   . Hyperlipidemia   . Hypertension   . Lumbago 04/05/2016  . Macular degeneration    ? which eye(s)  . Obstructive sleep apnea   . Osteoporosis, senile   . Overactive bladder   . Squamous cell skin cancer 2/16   left lower leg    Past Surgical History:  Procedure Laterality Date  . ABDOMINAL HYSTERECTOMY  1960's  . APPENDECTOMY  1960's  . MOLE REMOVAL  2000   facial  . TONSILLECTOMY      Allergies  Allergen Reactions  . Adhesive [Tape]   . Ampicillin   . Nitrofuran Derivatives   . Sulfur   . Zoloft [Sertraline Hcl] Other (See Comments)    Dizziness, nausea, faint feeling, and nightmares      Allergies as of 10/28/2018      Reactions   Adhesive [tape]    Ampicillin    Nitrofuran Derivatives    Sulfur      Zoloft [sertraline Hcl] Other (See Comments)   Dizziness, nausea, faint feeling, and nightmares        Medication List        Accurate as of 10/28/18 11:59 PM. Always use your most recent med list.          acetaminophen 325 MG tablet Commonly known as:  TYLENOL Take 650 mg by mouth daily.   aspirin EC 81 MG tablet Take 1 tablet (81 mg total) by mouth daily.   BIOFREEZE EX Apply 1 application topically as needed.   CALCIUM 600+D3 600-800 MG-UNIT Tabs Generic drug:  Calcium Carb-Cholecalciferol Take by mouth. Take one daily   fexofenadine 180 MG tablet Commonly known as:  ALLEGRA Take 180 mg by mouth daily.   Fish Oil 1200 MG Caps Take by mouth. Take one daily   fluticasone 50 MCG/ACT nasal spray Commonly known as:  FLONASE Place 1 spray into both nostrils daily.   ICAPS Caps Take by mouth. Take one I-Cap daily for eyes   LORazepam 0.5 MG tablet Commonly known as:  ATIVAN TAKE ONE TABLET ONCE DAILY AS NEEDED FOR ANXIETY.   losartan-hydrochlorothiazide 100-12.5 MG tablet Commonly known as:  HYZAAR TAKE (1) TABLET DAILY FOR HIGH BLOOD PRESSURE.   omeprazole 40 MG  capsule Commonly known as:  PRILOSEC TAKE 1 CAPSULE DAILY 1/2 HOUR BEFORE BREAKFAST FOR STOMACH ACID.   oxybutynin 10 MG 24 hr tablet Commonly known as:  DITROPAN-XL TAKE ONE TABLET AT BEDTIME.   SYSTANE 0.4-0.3 % Gel ophthalmic gel Generic drug:  Polyethyl Glycol-Propyl Glycol Place 1 application into both eyes 3 (three) times daily.   traMADol 50 MG tablet Commonly known as:  ULTRAM Take 1 tablet (50 mg total) by mouth every 6 (six) hours as needed for severe pain. Pt has this medication at home- no new supply provided.       Review of Systems:  Review of Systems  Constitutional: Negative for activity change, appetite change, chills, diaphoresis, fatigue and fever.  HENT: Positive for hearing loss. Negative for congestion and voice change.   Respiratory: Negative for cough and  shortness of breath.   Cardiovascular: Positive for leg swelling. Negative for chest pain and palpitations.  Gastrointestinal: Negative for abdominal distention, abdominal pain, constipation, diarrhea, nausea and vomiting.  Genitourinary: Positive for frequency. Negative for difficulty urinating and urgency.       1-3x/night  Musculoskeletal: Positive for arthralgias, back pain and gait problem.  Skin: Negative for color change and pallor.  Neurological: Positive for numbness. Negative for dizziness, tremors, speech difficulty and headaches.       Memory lapses. Tingling/numbness in calves upon first awakening in morning, goes away as day goes.   Psychiatric/Behavioral: Negative for agitation, behavioral problems, hallucinations and sleep disturbance. The patient is nervous/anxious.     Health Maintenance  Topic Date Due  . TETANUS/TDAP  12/14/2022  . INFLUENZA VACCINE  Completed  . DEXA SCAN  Completed  . PNA vac Low Risk Adult  Completed    Physical Exam: Vitals:   10/28/18 1436  BP: 130/68  Pulse: 95  Resp: 20  Temp: 98.7 F (37.1 C)  TempSrc: Oral  SpO2: 94%  Weight: 181 lb 6.4 oz (82.3 kg)  Height: 5\' 6"  (1.676 m)   Body mass index is 29.28 kg/m. Physical Exam  Constitutional: She appears well-developed and well-nourished.  HENT:  Head: Normocephalic and atraumatic.  Eyes: Pupils are equal, round, and reactive to light. EOM are normal.  Neck: Normal range of motion. Neck supple. No JVD present. No thyromegaly present.  Cardiovascular: Normal rate and regular rhythm.  No murmur heard. Pulmonary/Chest: Effort normal. She has no wheezes. She has no rales.  Abdominal: Soft. She exhibits no distension. There is no tenderness. There is no rebound and no guarding.  Musculoskeletal: She exhibits edema.  Chronic trace edema BLE. Ambulates with walker.   Neurological: She is alert. No cranial nerve deficit. She exhibits normal muscle tone. Coordination normal.  Oriented to  person and place.   Skin: Skin is warm and dry.  Psychiatric: She has a normal mood and affect. Her behavior is normal. Judgment and thought content normal.    Labs reviewed: Basic Metabolic Panel: Recent Labs    01/25/18 0000 05/04/18 0655 10/20/18 0000  NA 141 139 139  K 4.3 3.8 3.9  CL 104 104 105  CO2 29 29 25   GLUCOSE 119* 101* 116*  BUN 22 21 29*  CREATININE 0.78 0.90* 0.81  CALCIUM 10.0 9.9 9.6  TSH 4.27  --  3.26   Liver Function Tests: Recent Labs    01/25/18 0000 10/20/18 0000  AST 32 23  ALT 26 21  BILITOT 0.6 0.4  PROT 6.3 5.9*   No results for input(s): LIPASE, AMYLASE in the last  8760 hours. No results for input(s): AMMONIA in the last 8760 hours. CBC: Recent Labs    01/25/18 0000 05/04/18 0655 10/20/18 0000  WBC 7.1 6.6 6.5  HGB 13.1 13.3 12.0  HCT 39.2 38.9 35.7  MCV 91.6 91.3 94.9  PLT 194 194 178   Lipid Panel: Recent Labs    01/25/18 0000 10/20/18 0000  CHOL 190 171  HDL 51 40*  LDLCALC 118* 110*  TRIG 107 103  CHOLHDL 3.7 4.3   Lab Results  Component Value Date   HGBA1C 5.4 05/04/2018    Procedures since last visit: No results found.  Assessment/Plan  Lumbago multiple sites of OA, mainly lower back pain is manage, continue Tylenol 650mg  qd and Tramadol 50mg  q6h prn.   OAB (overactive bladder) Urinary frequency is managed, continue Pessary, Oxybutynin 10mg  qd.   GERD (gastroesophageal reflux disease) GERD, stable, continue  Omeprazole 40mg  qd.   HTN (hypertension) HTN, blood pressure is controlled, continue Losartan/HCTZ 100/12.5mg  qd.   Anxiety Her mood is managed, continue Lorazepam 0.5mg  daily prn.   Allergic rhinitis Allergic rhinitis, stable, continue Loratadine 10mg  qd, Fluticasone nasal spray daily, Allegra 180mg  qd.   Iron deficiency anemia Anemia, dc  Fe, last Hgb 12.0 1023/19, update CBC prior to the next appointment.    Hyperlipidemia Mildly elevated LDL 110, continue fish oil, update lipid penal  prior to the next appointment.    Labs/tests ordered: CBC lipid penal.   Next appt:  4  months.   Time spend 25 minutes.

## 2018-10-28 NOTE — Assessment & Plan Note (Signed)
multiple sites of OA, mainly lower back pain is manage, continue Tylenol 650mg  qd and Tramadol 50mg  q6h prn.

## 2018-10-28 NOTE — Assessment & Plan Note (Signed)
Urinary frequency is managed, continue Pessary, Oxybutynin 10mg  qd.

## 2018-10-28 NOTE — Patient Instructions (Addendum)
F/u in clinic [facility] 4 months, update lipid panel and CBC prior to the next appointment.

## 2018-10-28 NOTE — Assessment & Plan Note (Signed)
HTN, blood pressure is controlled, continue Losartan/HCTZ 100/12.5mg  qd.

## 2018-10-28 NOTE — Assessment & Plan Note (Signed)
GERD, stable, continue  Omeprazole 40mg  qd.

## 2018-10-28 NOTE — Assessment & Plan Note (Signed)
Mildly elevated LDL 110, continue fish oil, update lipid penal prior to the next appointment.

## 2018-10-28 NOTE — Assessment & Plan Note (Signed)
Allergic rhinitis, stable, continue Loratadine 10mg  qd, Fluticasone nasal spray daily, Allegra 180mg  qd.

## 2018-10-28 NOTE — Assessment & Plan Note (Addendum)
Anemia, dc  Fe, last Hgb 12.0 1023/19, update CBC prior to the next appointment.

## 2018-10-29 ENCOUNTER — Encounter: Payer: Self-pay | Admitting: Nurse Practitioner

## 2018-11-11 IMAGING — US US CAROTID DUPLEX BILAT
1 series · 13 of 24 positions shown · non-contrast
Comparison: None

CLINICAL DATA: [AGE] female with essential hypertension and
presyncope/dizziness

EXAM:
BILATERAL CAROTID DUPLEX ULTRASOUND
TECHNIQUE: Gray scale imaging, color Doppler and duplex ultrasound were
performed of bilateral carotid and vertebral arteries in the neck.

[Series 1: us carotid duplex bilat · 0.06mm/px · 13 of 43 slices shown]
[im 1/43]
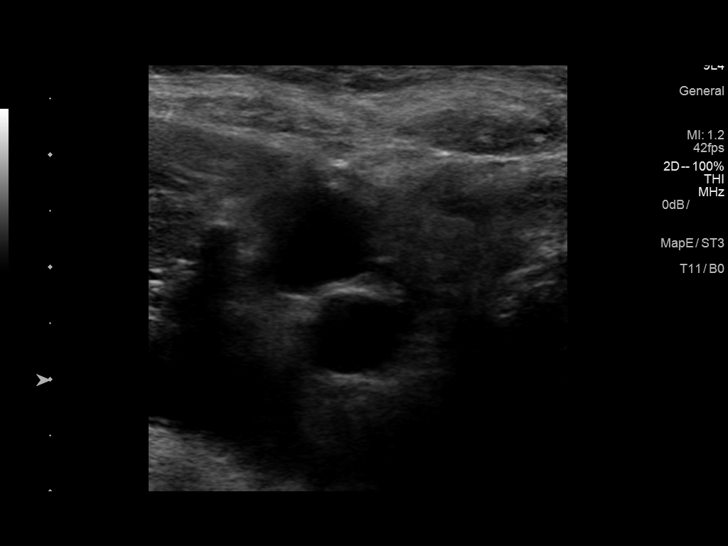
[im 4/43]
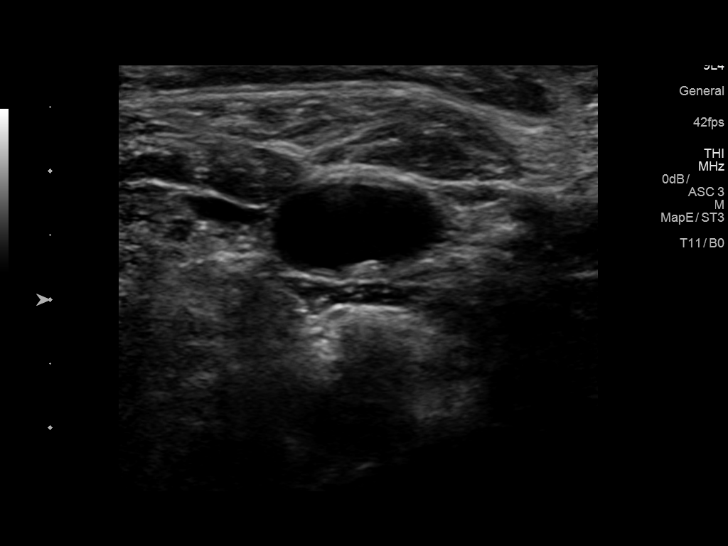
[im 8/43]
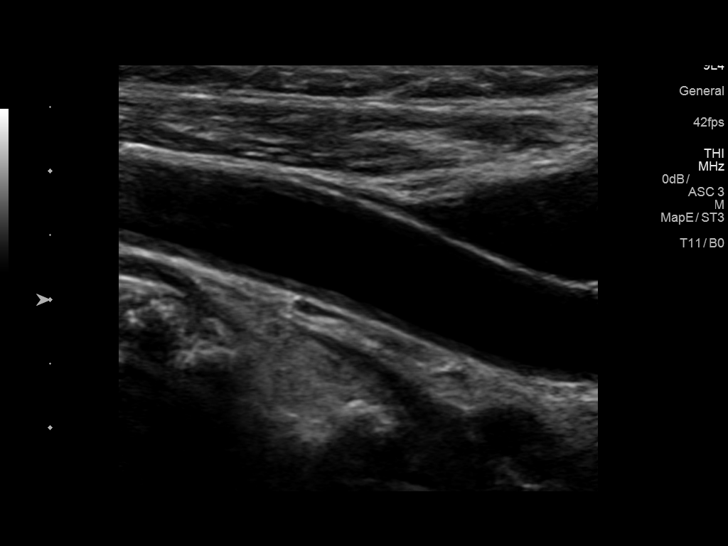
[im 11/43]
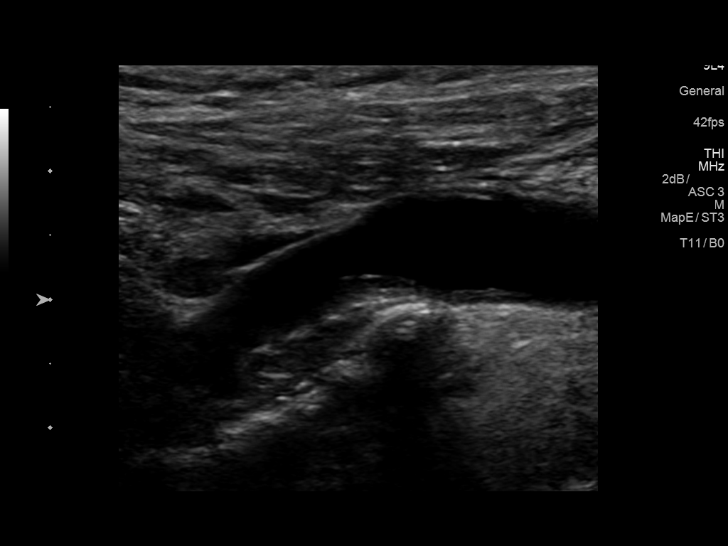
[im 15/43]
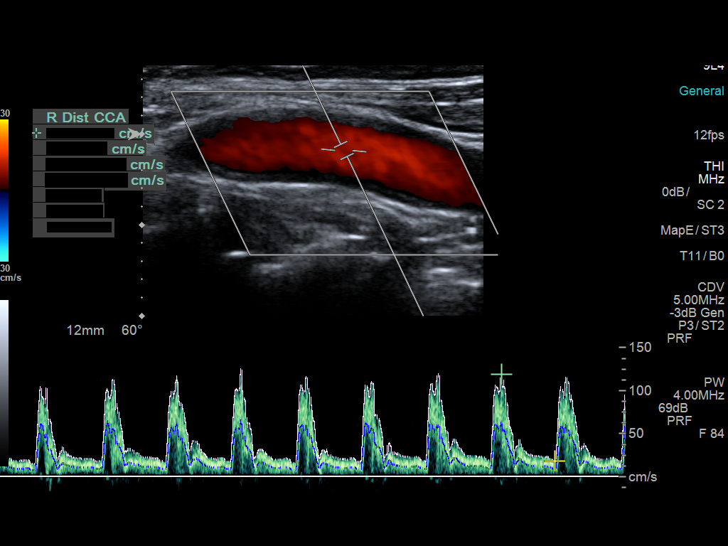
[im 19/43]
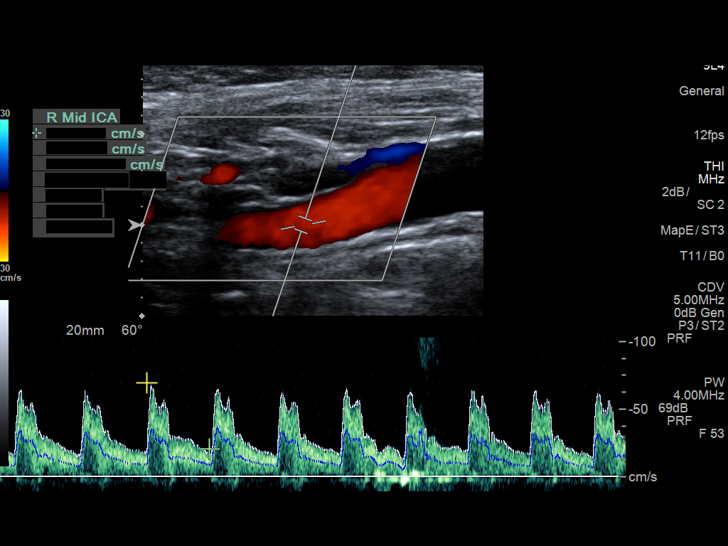
[im 22/43]
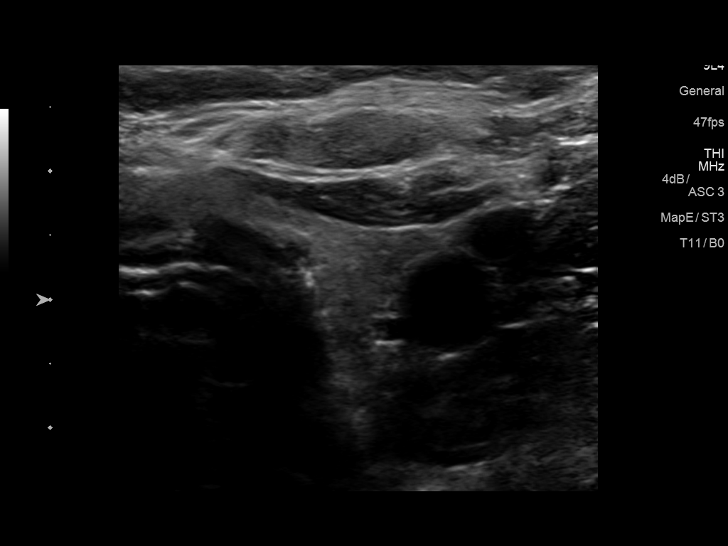
[im 24/43]
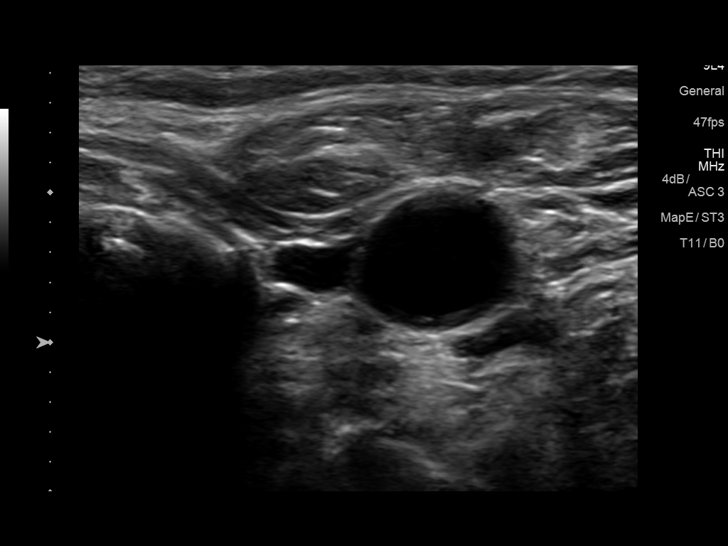
[im 28/43]
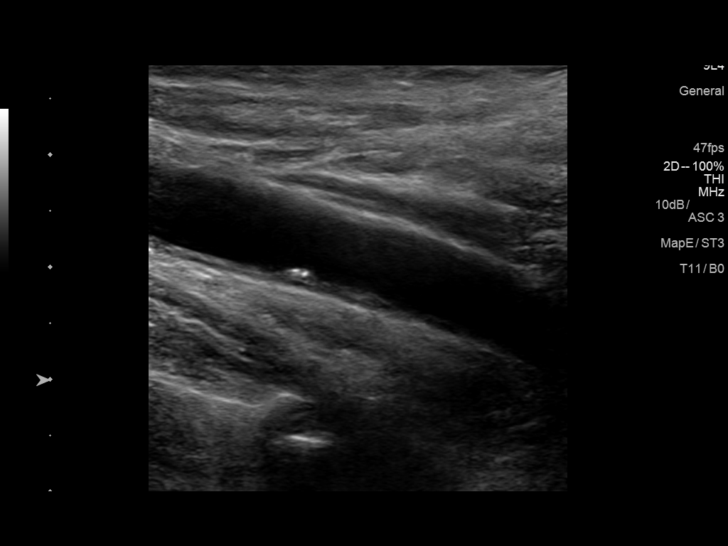
[im 32/43]
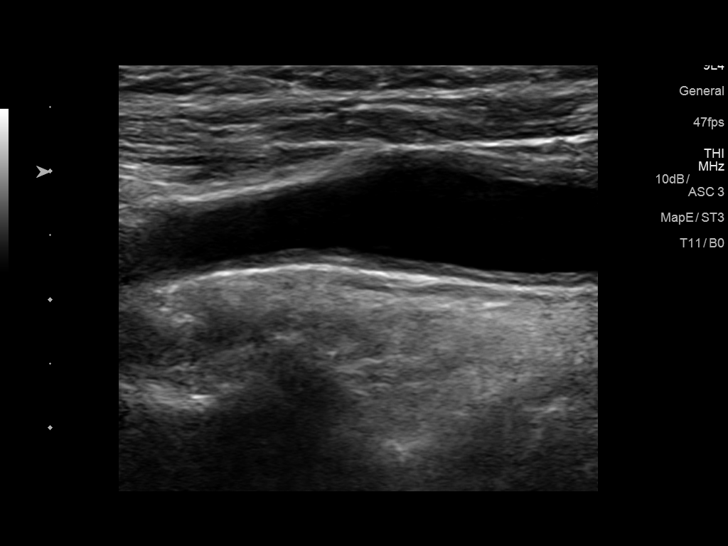
[im 35/43]
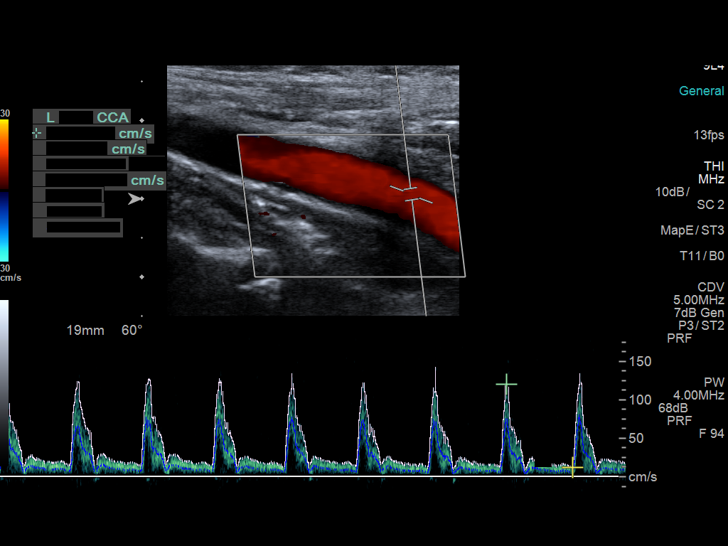
[im 39/43]
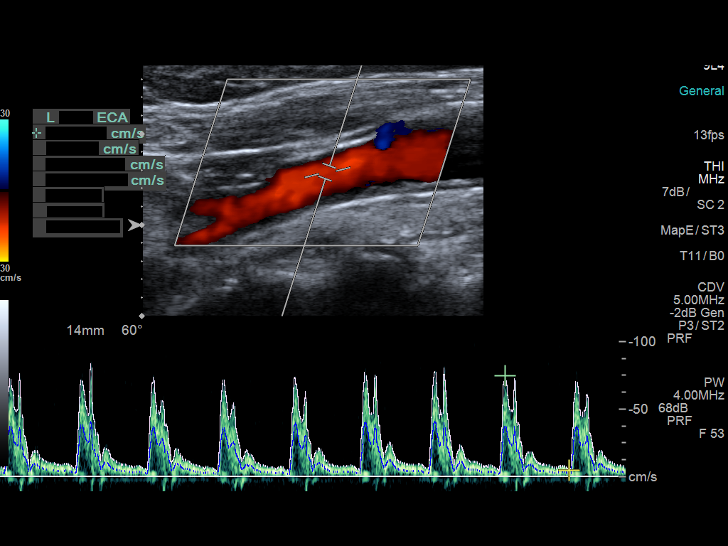
[im 43/43]
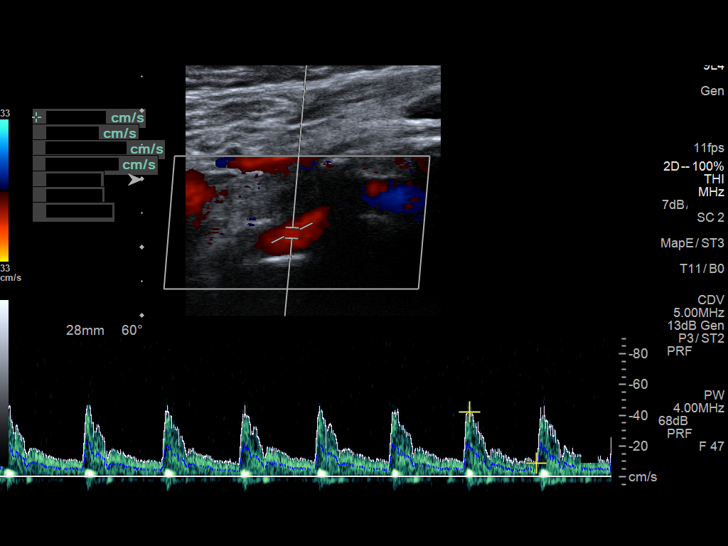

[13 of 24 positions shown; findings below may reference images not displayed]

FINDINGS: Criteria: Quantification of carotid stenosis is based on velocity
parameters that correlate the residual internal carotid diameter
with NASCET-based stenosis levels, using the diameter of the distal
internal carotid lumen as the denominator for stenosis measurement.

The following velocity measurements were obtained:

RIGHT

ICA:  69/21 cm/sec

CCA:  126/15 cm/sec

SYSTOLIC ICA/CCA RATIO:

DIASTOLIC ICA/CCA RATIO:

ECA:  109 cm/sec

LEFT

ICA:  109/28 cm/sec

CCA:  120/12 cm/sec

SYSTOLIC ICA/CCA RATIO:

DIASTOLIC ICA/CCA RATIO:

ECA:  75 cm/sec

RIGHT CAROTID ARTERY: Minimal atherosclerotic plaque in the common
carotid artery. No significant atherosclerotic plaque or evidence of
stenosis in the internal carotid artery.

RIGHT VERTEBRAL ARTERY:  Patent with normal antegrade flow.

LEFT CAROTID ARTERY: Minimal atherosclerotic plaque in the common
carotid artery. No significant atherosclerotic plaque or evidence of
stenosis in the internal carotid artery.

LEFT VERTEBRAL ARTERY:  Patent with normal antegrade flow.
IMPRESSION: 1. Minimal atherosclerotic plaque in the bilateral common carotid
arteries without evidence of stenosis.
2. No significant atherosclerotic plaque or evidence of stenosis in
the internal carotid arteries.
3. Vertebral arteries are patent with normal antegrade flow.

## 2018-12-02 ENCOUNTER — Encounter: Payer: Self-pay | Admitting: Nurse Practitioner

## 2018-12-02 ENCOUNTER — Non-Acute Institutional Stay: Payer: PPO | Admitting: Nurse Practitioner

## 2018-12-02 DIAGNOSIS — M5440 Lumbago with sciatica, unspecified side: Secondary | ICD-10-CM | POA: Diagnosis not present

## 2018-12-02 DIAGNOSIS — K219 Gastro-esophageal reflux disease without esophagitis: Secondary | ICD-10-CM | POA: Diagnosis not present

## 2018-12-02 DIAGNOSIS — M5412 Radiculopathy, cervical region: Secondary | ICD-10-CM

## 2018-12-02 DIAGNOSIS — J309 Allergic rhinitis, unspecified: Secondary | ICD-10-CM

## 2018-12-02 DIAGNOSIS — N3281 Overactive bladder: Secondary | ICD-10-CM

## 2018-12-02 DIAGNOSIS — F419 Anxiety disorder, unspecified: Secondary | ICD-10-CM

## 2018-12-02 DIAGNOSIS — G8929 Other chronic pain: Secondary | ICD-10-CM | POA: Diagnosis not present

## 2018-12-02 DIAGNOSIS — I10 Essential (primary) hypertension: Secondary | ICD-10-CM | POA: Diagnosis not present

## 2018-12-02 NOTE — Assessment & Plan Note (Signed)
Blood pressure is controlled, continue Losartan HCT 100/12.5mg  qd.

## 2018-12-02 NOTE — Assessment & Plan Note (Addendum)
Stable, continue pessary, dc Oxybutynin 10mg  qd to eliminate possible side effects of dry mouth, dry eyes,  dizziness, nightmare, headache. Observe.

## 2018-12-02 NOTE — Assessment & Plan Note (Signed)
Stable, continue Omeprazole 40mg qd.  

## 2018-12-02 NOTE — Assessment & Plan Note (Signed)
Stable, continue Allegra 180mg  qd, Fluticasone 70mcg daily.

## 2018-12-02 NOTE — Progress Notes (Addendum)
Location:   clinic [place]   Place of Service:  Clinic (12)clinic [facility]  Provider: Marlana Latus NP  Code Status: DNR Goals of Care: [place] 07/20/2018  Does Patient Have a Medical Advance Directive? Yes  Type of Advance Directive Living will  Does patient want to make changes to medical advance directive? No - Patient declined  Copy of [place] in Chart? -     Chief Complaint  Patient presents with  . Acute Visit    Pain in the upper mucles of both arms, dizzy spells x1-2    HPI: Patient is a 84 y.o. female seen today for medical management of chronic diseases.    The patient reported occasional dizziness when standing up, better once sits down, no change of vision, headache, chest pain/pressure, palpitation, or focal weakness associated with the events. She complains dry mouth, dry eyes, dizziness, nightmare, headache. She urinates 1-2x/night average.   Also she complains of sudden onset, recurrent needle pricking pain in R+L upper arms, lasted a few minutes, resolved w/o intervention, better with ROM of her neck, not reproduced with ROM of shoulders.   The patient has history of OAB, stable  on Oxybutynin 10mg  qd. GERD stable, on Omeprazole 40mg  qd, HTN, blood pressure is controlled on Losartan HCT, 100/12.5mg  qd. Her mood/sleep is managed with prn Lorazepam 0.5mg  daily. OA pain is managed with Tylenol 650mg  qd. Chronic rhinitis is managed with Allegra 180mg  qd, Fluticasone 73mcg daily.    Past Medical History:  Diagnosis Date  . GERD (gastroesophageal reflux disease)   . Hx of cataract surgery    both eyes  . Hyperglycemia   . Hyperlipidemia   . Hypertension   . Lumbago 04/05/2016  . Macular degeneration    ? which eye(s)  . Obstructive sleep apnea   . Osteoporosis, senile   . Overactive bladder   . Squamous cell skin cancer 2/16   left lower leg    Past Surgical History:  Procedure Laterality Date  . ABDOMINAL HYSTERECTOMY  1960's  .  APPENDECTOMY  1960's  . MOLE REMOVAL  2000   facial  . TONSILLECTOMY      Allergies  Allergen Reactions  . Adhesive [Tape]   . Ampicillin   . Nitrofuran Derivatives   . Sulfur   . Zoloft [Sertraline Hcl] Other (See Comments)    Dizziness, nausea, faint feeling, and nightmares      Allergies as of 12/02/2018      Reactions   Adhesive [tape]    Ampicillin    Nitrofuran Derivatives    Sulfur    Zoloft [sertraline Hcl] Other (See Comments)   Dizziness, nausea, faint feeling, and nightmares        Medication List        Accurate as of 12/02/18 11:59 PM. Always use your most recent med list.          acetaminophen 325 MG tablet Commonly known as:  TYLENOL Take 650 mg by mouth daily.   aspirin EC 81 MG tablet Take 1 tablet (81 mg total) by mouth daily.   BIOFREEZE EX Apply 1 application topically as needed.   CALCIUM 600+D3 600-800 MG-UNIT Tabs Generic drug:  Calcium Carb-Cholecalciferol Take by mouth. Take one daily   fexofenadine 180 MG tablet Commonly known as:  ALLEGRA Take 180 mg by mouth daily.   Fish Oil 1200 MG Caps Take by mouth. Take one daily   fluticasone 50 MCG/ACT nasal spray Commonly known as:  FLONASE Place 1 spray into both nostrils daily.   ICAPS Caps Take by mouth. Take one I-Cap daily for eyes   LORazepam 0.5 MG tablet Commonly known as:  ATIVAN TAKE ONE TABLET ONCE DAILY AS NEEDED FOR ANXIETY.   losartan-hydrochlorothiazide 100-12.5 MG tablet Commonly known as:  HYZAAR TAKE (1) TABLET DAILY FOR HIGH BLOOD PRESSURE.   omeprazole 40 MG capsule Commonly known as:  PRILOSEC TAKE 1 CAPSULE DAILY 1/2 HOUR BEFORE BREAKFAST FOR STOMACH ACID.   oxybutynin 10 MG 24 hr tablet Commonly known as:  DITROPAN-XL TAKE ONE TABLET AT BEDTIME.   SYSTANE 0.4-0.3 % Gel ophthalmic gel Generic drug:  Polyethyl Glycol-Propyl Glycol Place 1 application into both eyes 3 (three) times daily.   traMADol 50 MG tablet Commonly known as:  ULTRAM Take 1  tablet (50 mg total) by mouth every 6 (six) hours as needed for severe pain. Pt has this medication at home- no new supply provided.       Review of Systems:  Review of Systems  Constitutional: Negative for activity change, appetite change, chills, diaphoresis, fatigue, fever and unexpected weight change.  HENT: Positive for hearing loss.   Respiratory: Negative for cough, shortness of breath and wheezing.   Cardiovascular: Positive for leg swelling. Negative for chest pain.  Gastrointestinal: Negative for abdominal distention, abdominal pain, constipation, diarrhea, nausea and vomiting.  Genitourinary: Positive for frequency. Negative for difficulty urinating, dysuria and urgency.       1-3x/night  Musculoskeletal: Positive for arthralgias, back pain and gait problem.  Skin: Negative for color change and pallor.  Neurological: Positive for numbness. Negative for dizziness, speech difficulty, weakness and headaches.       [place]ves  Psychiatric/Behavioral: Positive for sleep disturbance. Negative for agitation, behavioral problems, confusion and hallucinations. The patient is nervous/anxious.     Health Maintenance  Topic Date Due  . TETANUS/TDAP  12/14/2022  . INFLUENZA VACCINE  Completed  . DEXA SCAN  Completed  . PNA vac Low Risk Adult  Completed    Physical Exam: Vitals:   12/02/18 1609  BP: 130/74  Pulse: 81  Resp: 18  Temp: 98.5 F (36.9 C)  TempSrc: Oral  SpO2: 95%  Weight: 178 lb 9.6 oz (81 kg)  Height: 5\' 6"  (1.676 m)   Body mass index is 28.83 kg/m. Physical Exam  Constitutional: She is oriented to person, place, and time. She appears well-developed and well-nourished.  HENT:  Head: Normocephalic and atraumatic.  Eyes: Pupils are equal, round, and reactive to light. EOM are normal.  Neck: Normal range of motion. Neck supple. No JVD present. No thyromegaly present.  Cardiovascular: Normal rate and regular rhythm.  No murmur heard. Pulmonary/Chest: Effort  normal. She has no wheezes. She has no rales.  Abdominal: Soft. She exhibits no distension. There is no tenderness. There is no rebound and no guarding.  Musculoskeletal: She exhibits edema.  Chronic edema BLE  Neurological: She is alert and oriented to person, place, and time. No cranial nerve deficit. She exhibits normal muscle tone. Coordination normal.  Skin: Skin is warm and dry.  Psychiatric: She has a normal mood and affect. Her behavior is normal. Judgment and thought content normal.    Labs reviewed: Basic Metabolic Panel: Recent Labs    01/25/18 0000 05/04/18 0655 10/20/18 0000  NA 141 139 139  K 4.3 3.8 3.9  CL 104 104 105  CO2 29 29 25   GLUCOSE 119* 101* 116*  BUN 22 21 29*  CREATININE 0.78 0.90* 0.81  CALCIUM 10.0 9.9 9.6  TSH 4.27  --  3.26   Liver Function Tests: Recent Labs    01/25/18 0000 10/20/18 0000  AST 32 23  ALT 26 21  BILITOT 0.6 0.4  PROT 6.3 5.9*   No results for input(s): LIPASE, AMYLASE in the last 8760 hours. No results for input(s): AMMONIA in the last 8760 hours. CBC: Recent Labs    01/25/18 0000 05/04/18 0655 10/20/18 0000  WBC 7.1 6.6 6.5  HGB 13.1 13.3 12.0  HCT 39.2 38.9 35.7  MCV 91.6 91.3 94.9  PLT 194 194 178   Lipid Panel: Recent Labs    01/25/18 0000 10/20/18 0000  CHOL 190 171  HDL 51 40*  LDLCALC 118* 110*  TRIG 107 103  CHOLHDL 3.7 4.3   Lab Results  Component Value Date   HGBA1C 5.4 05/04/2018    Procedures since last visit: No results found.  Assessment/Plan  HTN (hypertension) Blood pressure is controlled, continue Losartan HCT 100/12.5mg  qd.   Allergic rhinitis Stable, continue Allegra 180mg  qd, Fluticasone 49mcg daily.   GERD (gastroesophageal reflux disease) Stable, continue Omeprazole 40mg  qd.   OAB (overactive bladder) Stable, continue pessary, dc Oxybutynin 10mg  qd to eliminate possible side effects of dry mouth, dry eyes,  dizziness, nightmare, headache. Observe.   Anxiety Prn  Lorazepam 0.5mg  daily to help relax and rest.   Lumbago Multiple sites, managed with Tylenol 650mg  qd, ambulates with walker.   Cervical neuropathic pain R+L arms, PT to evaluate and treat as indicated. Ortho or neurology referral as needed.    Labs/tests ordered:  None  Next appt:  02/22/2019

## 2018-12-02 NOTE — Assessment & Plan Note (Signed)
Multiple sites, managed with Tylenol 650mg  qd, ambulates with walker.

## 2018-12-02 NOTE — Patient Instructions (Signed)
Stop taking Oxybutynin to eliminate possible side effects. PT to evaluate and treatment for neuropathic pain in arms.

## 2018-12-02 NOTE — Assessment & Plan Note (Signed)
Prn Lorazepam 0.5mg  daily to help relax and rest.

## 2018-12-02 NOTE — Assessment & Plan Note (Signed)
R+L arms, PT to evaluate and treat as indicated. Ortho or neurology referral as needed.

## 2018-12-03 ENCOUNTER — Encounter: Payer: Self-pay | Admitting: Nurse Practitioner

## 2018-12-06 DIAGNOSIS — G4733 Obstructive sleep apnea (adult) (pediatric): Secondary | ICD-10-CM | POA: Diagnosis not present

## 2018-12-10 ENCOUNTER — Telehealth: Payer: Self-pay | Admitting: *Deleted

## 2018-12-10 NOTE — Telephone Encounter (Signed)
Patient called and stated that she wasn't sure if she was to Discontinue the Oxybutynin or not.  I reviewed ManXie's last OV note and it does state to Discontinue due to side effects. Patient stated that she took one last night.  I told her to Discontinue the medication and she agreed.  Updated Patient's medication list.     Assessment & Plan Note by Mast, Man X, NP at 12/02/2018 12:01 PM  Author: Mast, Man X, NP Author Type: Nurse Practitioner Filed: 12/02/2018 4:56 PM  Note Status: [place] List: Cosign Not Required Encounter Date: 12/02/2018  Problem: OAB (overactive bladder)  Editor: Mast, Man X, NP (Nurse Practitioner)  Prior Versions: 1. Mast, Man X, NP (Nurse Practitioner) at 12/02/2018 12:01 PM - Written    Stable, continue pessary, dc Oxybutynin 10mg  qd to eliminate possible side effects of dry mouth, dry eyes,  dizziness, nightmare, headache. Observe.

## 2018-12-13 DIAGNOSIS — M79601 Pain in right arm: Secondary | ICD-10-CM | POA: Diagnosis not present

## 2018-12-13 DIAGNOSIS — M6281 Muscle weakness (generalized): Secondary | ICD-10-CM | POA: Diagnosis not present

## 2018-12-13 DIAGNOSIS — E785 Hyperlipidemia, unspecified: Secondary | ICD-10-CM | POA: Diagnosis not present

## 2018-12-13 DIAGNOSIS — R2681 Unsteadiness on feet: Secondary | ICD-10-CM | POA: Diagnosis not present

## 2018-12-13 DIAGNOSIS — M545 Low back pain: Secondary | ICD-10-CM | POA: Diagnosis not present

## 2018-12-13 DIAGNOSIS — D049 Carcinoma in situ of skin, unspecified: Secondary | ICD-10-CM | POA: Diagnosis not present

## 2018-12-13 DIAGNOSIS — N3281 Overactive bladder: Secondary | ICD-10-CM | POA: Diagnosis not present

## 2018-12-13 DIAGNOSIS — M79602 Pain in left arm: Secondary | ICD-10-CM | POA: Diagnosis not present

## 2019-01-05 ENCOUNTER — Other Ambulatory Visit: Payer: Self-pay | Admitting: *Deleted

## 2019-01-05 MED ORDER — OMEPRAZOLE 40 MG PO CPDR: DELAYED_RELEASE_CAPSULE | ORAL | 1 refills | Status: DC

## 2019-01-05 NOTE — Telephone Encounter (Signed)
[place] 

## 2019-01-07 ENCOUNTER — Other Ambulatory Visit: Payer: Self-pay | Admitting: Nurse Practitioner

## 2019-01-17 ENCOUNTER — Telehealth: Payer: Self-pay | Admitting: *Deleted

## 2019-01-17 NOTE — Telephone Encounter (Signed)
Patient called and stated that back in October she was told to HOLD her Loratadine 10mg  for her allergies. Patient stated that she is having a runny nose and wants to go back on it. Wants it added back to her medication list and sent to pharmacy. Please Advise.

## 2019-01-17 NOTE — Telephone Encounter (Signed)
That is okay, thank you 

## 2019-01-17 NOTE — Telephone Encounter (Signed)
Patient notified and agreed. Stated that she does not need called in at this time because she still has some left. Medication list updated

## 2019-02-10 ENCOUNTER — Non-Acute Institutional Stay: Payer: PPO | Admitting: Nurse Practitioner

## 2019-02-10 DIAGNOSIS — G8929 Other chronic pain: Secondary | ICD-10-CM | POA: Diagnosis not present

## 2019-02-10 DIAGNOSIS — D509 Iron deficiency anemia, unspecified: Secondary | ICD-10-CM

## 2019-02-10 DIAGNOSIS — N3281 Overactive bladder: Secondary | ICD-10-CM | POA: Diagnosis not present

## 2019-02-10 DIAGNOSIS — J309 Allergic rhinitis, unspecified: Secondary | ICD-10-CM | POA: Diagnosis not present

## 2019-02-10 DIAGNOSIS — M5412 Radiculopathy, cervical region: Secondary | ICD-10-CM

## 2019-02-10 DIAGNOSIS — F419 Anxiety disorder, unspecified: Secondary | ICD-10-CM

## 2019-02-10 DIAGNOSIS — I10 Essential (primary) hypertension: Secondary | ICD-10-CM | POA: Diagnosis not present

## 2019-02-10 DIAGNOSIS — K219 Gastro-esophageal reflux disease without esophagitis: Secondary | ICD-10-CM

## 2019-02-10 DIAGNOSIS — M5440 Lumbago with sciatica, unspecified side: Secondary | ICD-10-CM

## 2019-02-10 MED ORDER — FEXOFENADINE HCL 180 MG PO TABS: 180.0000 mg | ORAL_TABLET | Freq: Every day | ORAL | 2 refills | Status: DC

## 2019-02-10 NOTE — Assessment & Plan Note (Signed)
Will resume Allegra, dc Claritin. Continue Flonase

## 2019-02-10 NOTE — Assessment & Plan Note (Signed)
Blood pressure is controlled, continue Losartan/HCT 100/12.5mg  qd.

## 2019-02-10 NOTE — Assessment & Plan Note (Signed)
Stable, continue Lorazepam 0.5mg  qd prn.

## 2019-02-10 NOTE — Assessment & Plan Note (Signed)
Livable, continue Tylenol 650mg  qd,  Tramadol 50mg  q6h prn.

## 2019-02-10 NOTE — Assessment & Plan Note (Signed)
Off Iron, pending CBC

## 2019-02-10 NOTE — Assessment & Plan Note (Addendum)
Persisted,  no urinary retention, continue Pessary, continue to be off Oxybutynin 10mg  qd.

## 2019-02-10 NOTE — Progress Notes (Signed)
Location:   clinic [place]   Place of Service:  Clinic (12)clinic Provider: Marlana Latus NP  Code Status: DNR Goals of Care: [place] 07[place]20/2018  Does Patient Have a Medical Advance Directive? Yes  Type of Advance Directive Living will  Does patient want to make changes to medical advance directive? No - Patient declined  Copy of [place] in Chart? -     Chief Complaint  Patient presents with  . Medical Management of Chronic Issues    medication questions    HPI: Patient is a 85 y.o. female seen today for medical management of chronic diseases.    The patient lives in [place], Hx of chronic neck pain, ambulates with walker, on Tylenol 650mg  qd,  Tramadol 50mg  q6h prn. Urinary frequency, no urinary retention, off Oxybutynin 10mg  qd. GERD stable on Omeprazole 40mg  qd. HTN, blood pressure is controlled on Losartan/HCT 100/12.5mg  qd. Anxiety: mild prn Lorazepam 0.5mg  daily prn is adequate.    Past Medical History:  Diagnosis Date  . GERD (gastroesophageal reflux disease)   . Hx of cataract surgery    both eyes  . Hyperglycemia   . Hyperlipidemia   . Hypertension   . Lumbago 04/05/2016  . Macular degeneration    ? which eye(s)  . Obstructive sleep apnea   . Osteoporosis, senile   . Overactive bladder   . Squamous cell skin cancer 2/16   left lower leg    Past Surgical History:  Procedure Laterality Date  . ABDOMINAL HYSTERECTOMY  1960's  . APPENDECTOMY  1960's  . MOLE REMOVAL  2000   facial  . TONSILLECTOMY      Allergies  Allergen Reactions  . Adhesive [Tape]   . Ampicillin   . Nitrofuran Derivatives   . Sulfur   . Zoloft [Sertraline Hcl] Other (See Comments)    Dizziness, nausea, faint feeling, and nightmares      Allergies as of 02/10/2019      Reactions   Adhesive [tape]    Ampicillin    Nitrofuran Derivatives    Sulfur    Zoloft [sertraline Hcl] Other (See Comments)   Dizziness, nausea, faint feeling, and nightmares        Medication List       Accurate as of February 10, 2019 11:59 PM. Always use your most recent med list.        acetaminophen 325 MG tablet Commonly known as:  TYLENOL Take 650 mg by mouth daily.   aspirin EC 81 MG tablet Take 1 tablet (81 mg total) by mouth daily.   BIOFREEZE EX Apply 1 application topically as needed.   CALCIUM 600+D3 600-800 MG-UNIT Tabs Generic drug:  Calcium Carb-Cholecalciferol Take by mouth. Take one daily   fexofenadine 180 MG tablet Commonly known as:  ALLEGRA Take 1 tablet (180 mg total) by mouth daily.   Fish Oil 1200 MG Caps Take by mouth. Take one daily   fluticasone 50 MCG/ACT nasal spray Commonly known as:  FLONASE Place 1 spray into both nostrils daily.   ICAPS Caps Take by mouth. Take one I-Cap daily for eyes   LORazepam 0.5 MG tablet Commonly known as:  ATIVAN TAKE ONE TABLET ONCE DAILY AS NEEDED FOR ANXIETY.   losartan-hydrochlorothiazide 100-12.5 MG tablet Commonly known as:  HYZAAR TAKE (1) TABLET DAILY FOR HIGH BLOOD PRESSURE.   omeprazole 40 MG capsule Commonly known as:  PRILOSEC Take one capsule by mouth once daily 1/2 hour before breakfast for stomach  acid.   SYSTANE 0.4-0.3 % Gel ophthalmic gel Generic drug:  Polyethyl Glycol-Propyl Glycol Place 1 application into both eyes 3 (three) times daily.   traMADol 50 MG tablet Commonly known as:  ULTRAM Take 1 tablet (50 mg total) by mouth every 6 (six) hours as needed for severe pain. Pt has this medication at home- no new supply provided.       Review of Systems:  Review of Systems  Constitutional: Negative for activity change, appetite change, chills, diaphoresis, fatigue, fever and unexpected weight change.  HENT: Positive for congestion, hearing loss, postnasal drip and rhinorrhea. Negative for sinus pressure, sinus pain and voice change.   Respiratory: Negative for cough, shortness of breath and wheezing.   Cardiovascular: Negative for chest pain,  palpitations and leg swelling.  Gastrointestinal: Negative for abdominal distention, abdominal pain, constipation, diarrhea, nausea and vomiting.  Genitourinary: Positive for frequency. Negative for difficulty urinating, dysuria and urgency.  Musculoskeletal: Positive for arthralgias, back pain and gait problem.  Skin: Negative for color change.  Neurological: Positive for numbness. Negative for dizziness, speech difficulty, weakness and headaches.       Chronic numbness in calves/lower leg.   Psychiatric/Behavioral: Positive for sleep disturbance. Negative for agitation and behavioral problems. The patient is nervous/anxious.     Health Maintenance  Topic Date Due  . TETANUS/TDAP  12/14/2022  . INFLUENZA VACCINE  Completed  . DEXA SCAN  Completed  . PNA vac Low Risk Adult  Completed    Physical Exam: Vitals:   02/10/19 1319  BP: 130/68  Pulse: 100  Resp: 20  Temp: 98.4 F (36.9 C)  TempSrc: Oral  SpO2: 96%  Weight: 184 lb 12.8 oz (83.8 kg)  Height: 5\' 6"  (1.676 m)   Body mass index is 29.83 kg/m. Physical Exam Constitutional:      General: She is not in acute distress.    Appearance: Normal appearance. She is not ill-appearing, toxic-appearing or diaphoretic.  HENT:     Head: Normocephalic and atraumatic.     Nose: Congestion and rhinorrhea present.     Mouth/Throat:     Mouth: Mucous membranes are moist.     Pharynx: No oropharyngeal exudate or posterior oropharyngeal erythema.  Eyes:     Extraocular Movements: Extraocular movements intact.     Pupils: Pupils are equal, round, and reactive to light.  Neck:     Musculoskeletal: Normal range of motion and neck supple.  Cardiovascular:     Rate and Rhythm: Normal rate and regular rhythm.     Heart sounds: No murmur.  Pulmonary:     Effort: Pulmonary effort is normal.     Breath sounds: No wheezing, rhonchi or rales.  Abdominal:     General: There is no distension.     Palpations: Abdomen is soft.      Tenderness: There is no abdominal tenderness. There is no guarding or rebound.  Musculoskeletal:     Right lower leg: No edema.     Left lower leg: No edema.     Comments: Ambulates with walker.   Skin:    General: Skin is warm and dry.  Neurological:     General: No focal deficit present.     Mental Status: She is alert and oriented to person, place, and time. Mental status is at baseline.     Motor: No weakness.     Coordination: Coordination normal.     Gait: Gait abnormal.  Psychiatric:        Mood  and Affect: Mood normal.        Behavior: Behavior normal.        Thought Content: Thought content normal.        Judgment: Judgment normal.     Labs reviewed: Basic Metabolic Panel: Recent Labs    05/04/18 0655 10/20/18 0000  NA 139 139  K 3.8 3.9  CL 104 105  CO2 29 25  GLUCOSE 101* 116*  BUN 21 29*  CREATININE 0.90* 0.81  CALCIUM 9.9 9.6  TSH  --  3.26   Liver Function Tests: Recent Labs    10/20/18 0000  AST 23  ALT 21  BILITOT 0.4  PROT 5.9*   No results for input(s): LIPASE, AMYLASE in the last 8760 hours. No results for input(s): AMMONIA in the last 8760 hours. CBC: Recent Labs    05/04/18 0655 10/20/18 0000  WBC 6.6 6.5  HGB 13.3 12.0  HCT 38.9 35.7  MCV 91.3 94.9  PLT 194 178   Lipid Panel: Recent Labs    10/20/18 0000  CHOL 171  HDL 40*  LDLCALC 110*  TRIG 103  CHOLHDL 4.3   Lab Results  Component Value Date   HGBA1C 5.4 05/04/2018    Procedures since last visit: No results found.  Assessment/Plan  HTN (hypertension) Blood pressure is controlled, continue Losartan/HCT 100/12.5mg  qd.   GERD (gastroesophageal reflux disease) Stable, continue Omeprazole 40mg  qd  OAB (overactive bladder) Persisted,  no urinary retention, continue Pessary, continue to be off Oxybutynin 10mg  qd.   Anxiety Stable, continue Lorazepam 0.5mg  qd prn.   Lumbago Stable, under went Ortho in the past, continue Tylenol 650mg  qd, prn Tramadol 50mg  q6h  prn   Cervical neuropathic pain  Livable, continue Tylenol 650mg  qd,  Tramadol 50mg  q6h prn.  Allergic rhinitis Will resume Allegra, dc Claritin. Continue Flonase  Iron deficiency anemia Off Iron, pending CBC   Labs/tests ordered:  None  Next appt:  4 months

## 2019-02-10 NOTE — Patient Instructions (Signed)
F/u in clinic [facility] 4 months.  

## 2019-02-10 NOTE — Assessment & Plan Note (Signed)
Stable, under went Ortho in the past, continue Tylenol 650mg  qd, prn Tramadol 50mg  q6h prn

## 2019-02-10 NOTE — Assessment & Plan Note (Signed)
Stable, continue Omeprazole 40mg qd.  

## 2019-02-11 ENCOUNTER — Telehealth: Payer: Self-pay | Admitting: *Deleted

## 2019-02-11 NOTE — Telephone Encounter (Signed)
Spoke with patient she stated that she understood her new directions and will start taking it in the morning.

## 2019-02-11 NOTE — Telephone Encounter (Signed)
I will suggest to take it in morning, starts tomorrow.

## 2019-02-11 NOTE — Telephone Encounter (Signed)
Patient called and stated that she was seen yesterday. Took Fexofenadine last night at 6pm. Patient stated her nose was running. About 2:30am she woke up feeling nauseated and she got up this morning with her legs feeling tingling.  Patient wants to know if she can start taking this in the morning instead of in the evening. She stated that it states to take every 24 hours. She started it at 6pm but wants to start taking it in the morning but doesn't want to skip a dose. Please Advise.

## 2019-02-12 ENCOUNTER — Encounter: Payer: Self-pay | Admitting: Nurse Practitioner

## 2019-02-14 ENCOUNTER — Telehealth: Payer: Self-pay | Admitting: *Deleted

## 2019-02-14 NOTE — Telephone Encounter (Signed)
Patient called and stated that her medication list needed to be updated.  Systane gel Apply each eye at bedtime Systane Ultra Place one applicationi into both eyes three times daily.   Medication list updated.

## 2019-02-22 ENCOUNTER — Other Ambulatory Visit: Payer: PPO

## 2019-02-22 DIAGNOSIS — D509 Iron deficiency anemia, unspecified: Secondary | ICD-10-CM | POA: Diagnosis not present

## 2019-02-22 DIAGNOSIS — E782 Mixed hyperlipidemia: Secondary | ICD-10-CM

## 2019-02-22 LAB — CBC
HEMATOCRIT: 37 % (ref 35.0–45.0)
HEMOGLOBIN: 12.6 g/dL (ref 11.7–15.5)
MCH: 32.1 pg (ref 27.0–33.0)
MCHC: 34.1 g/dL (ref 32.0–36.0)
MCV: 94.1 fL (ref 80.0–100.0)
MPV: 9.8 fL (ref 7.5–12.5)
Platelets: 214 10*3/uL (ref 140–400)
RBC: 3.93 10*6/uL (ref 3.80–5.10)
RDW: 12.7 % (ref 11.0–15.0)
WBC: 6.9 10*3/uL (ref 3.8–10.8)

## 2019-02-22 LAB — LIPID PANEL
CHOL/HDL RATIO: 4.5 (calc) (ref <5.0)
Cholesterol: 202 mg/dL — ABNORMAL HIGH (ref <200)
HDL: 45 mg/dL — AB (ref >=50)
LDL CHOLESTEROL (CALC): 135 mg/dL — AB
NON-HDL CHOLESTEROL (CALC): 157 mg/dL — AB (ref <130)
Triglycerides: 111 mg/dL (ref <150)

## 2019-02-24 ENCOUNTER — Encounter: Payer: Self-pay | Admitting: Nurse Practitioner

## 2019-02-25 ENCOUNTER — Other Ambulatory Visit: Payer: Self-pay | Admitting: *Deleted

## 2019-02-25 MED ORDER — ATORVASTATIN CALCIUM 20 MG PO TABS: 20.0000 mg | ORAL_TABLET | Freq: Every day | ORAL | 2 refills | Status: DC

## 2019-02-28 ENCOUNTER — Other Ambulatory Visit: Payer: Self-pay | Admitting: *Deleted

## 2019-02-28 DIAGNOSIS — E782 Mixed hyperlipidemia: Secondary | ICD-10-CM

## 2019-04-07 ENCOUNTER — Other Ambulatory Visit: Payer: Self-pay | Admitting: Nurse Practitioner

## 2019-04-11 IMAGING — CT CT HEAD W/O CM
3 of 4 series · 16 of 47 positions shown, 19 images · non-contrast
Comparison: None.

CLINICAL DATA: Headaches for months

EXAM:
CT HEAD WITHOUT CONTRAST
TECHNIQUE: Contiguous axial images were obtained from the base of the skull
through the vertex without intravenous contrast.

[Series 32: 3d filtered head w/o · axial · non-contrast · 0.49mm/px · z∈[-13,+117]mm · 10 of 32 slices shown, 13 images]
[im 3/32  brain]
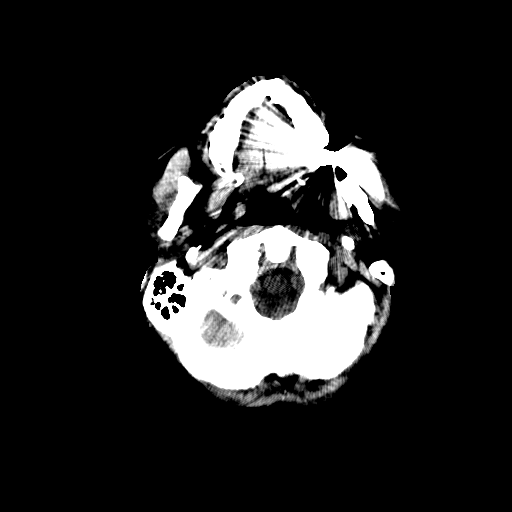
[im 3/32  bone]
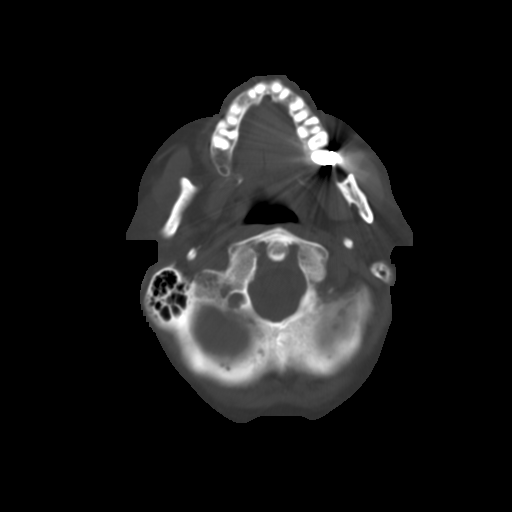
[im 5/32  brain]
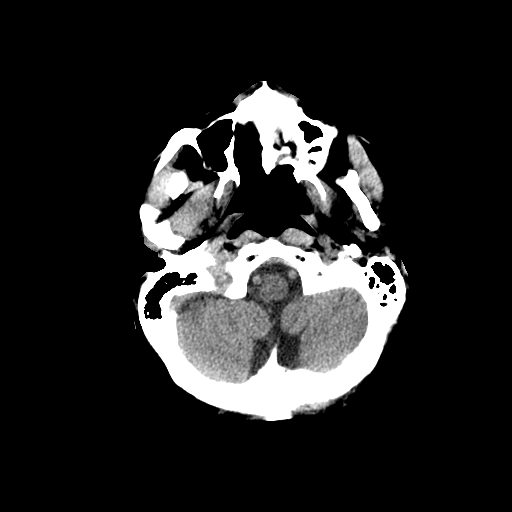
[im 9/32  brain]
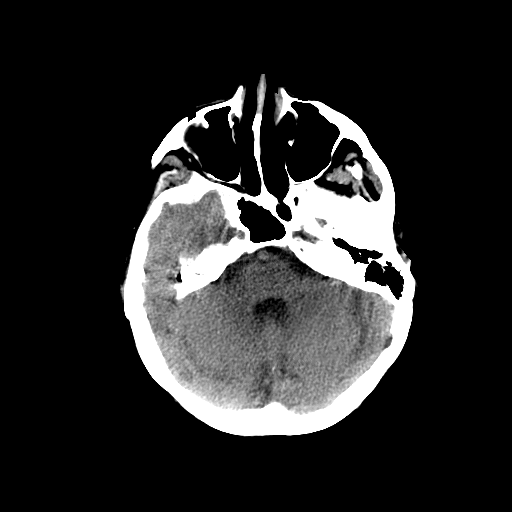
[im 12/32  brain]
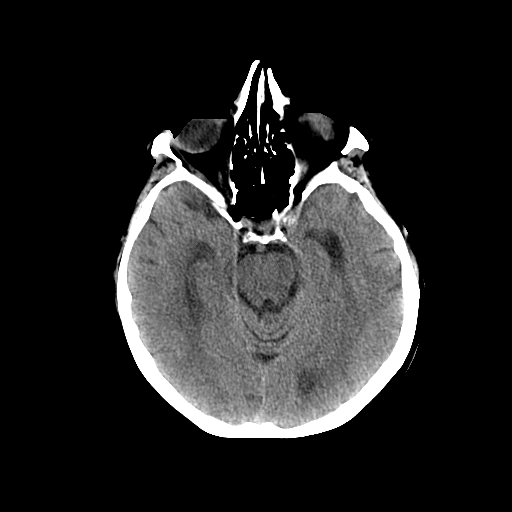
[im 14/32  brain]
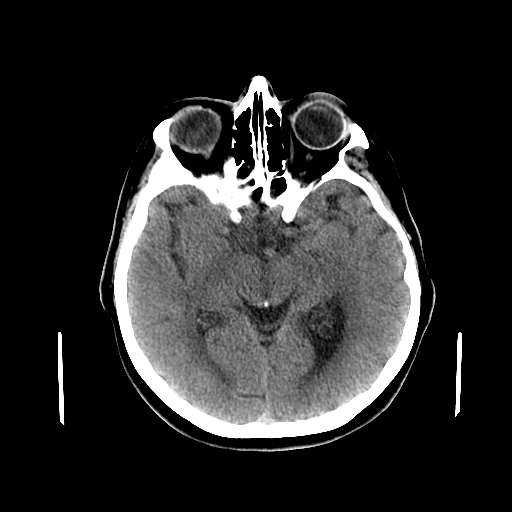
[im 14/32  bone]
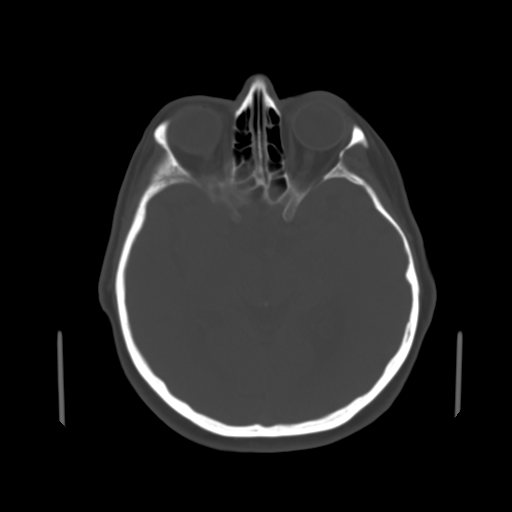
[im 18/32  brain]
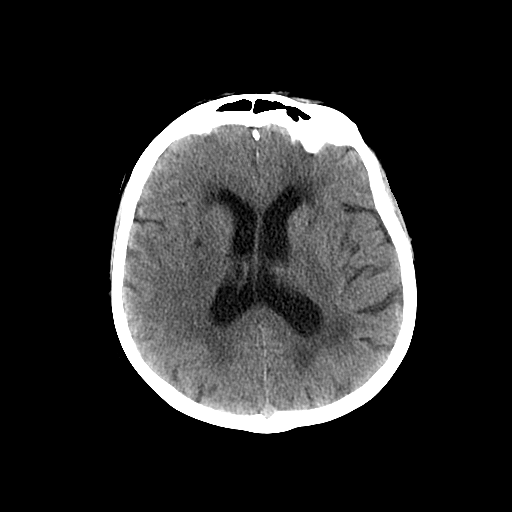
[im 20/32  brain]
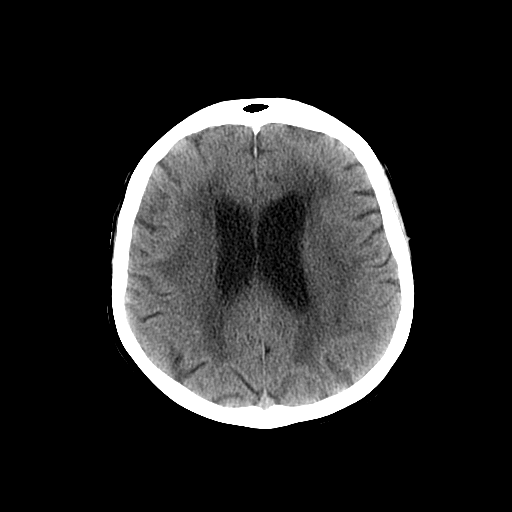
[im 23/32  brain]
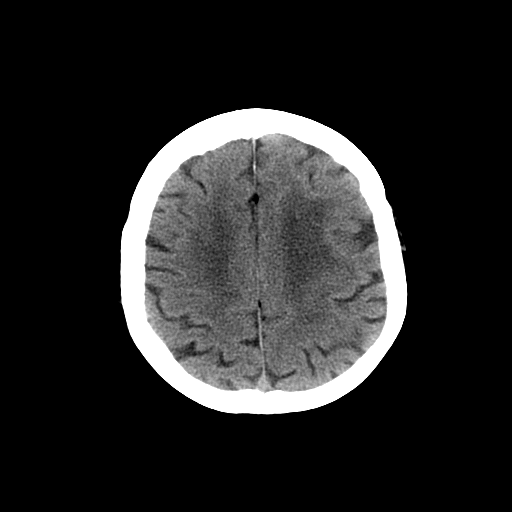
[im 27/32  brain]
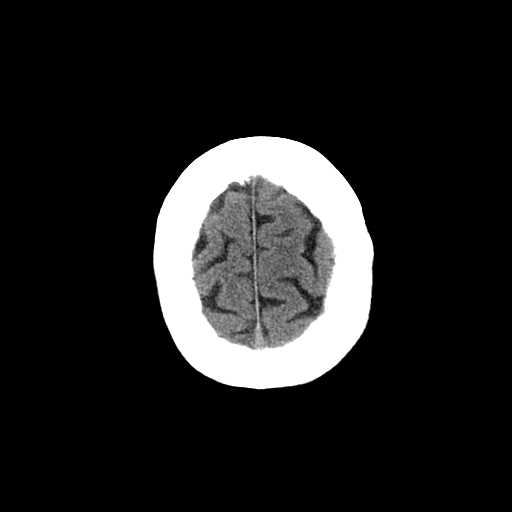
[im 27/32  bone]
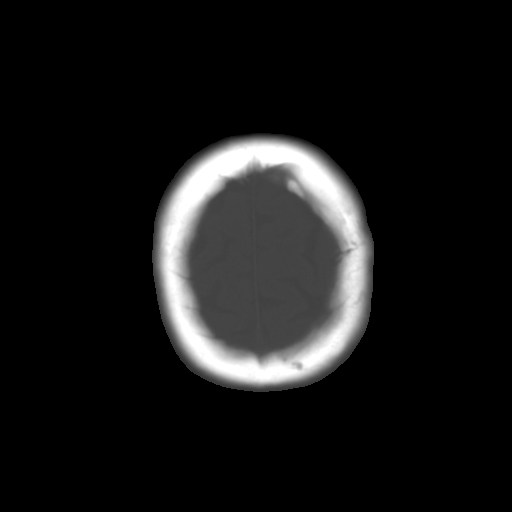
[im 29/32  brain]
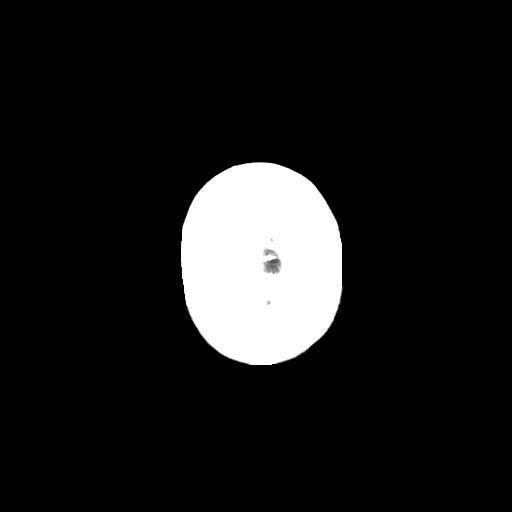

[Series 601: coronal brain · coronal · 0.49mm/px · 3 of 72 slices shown]
[im 24/72  brain]
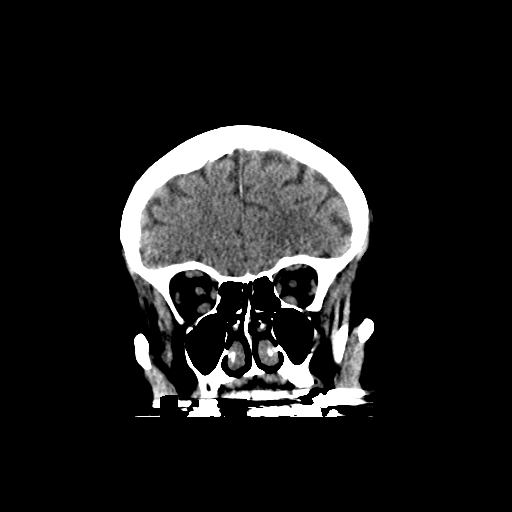
[im 32/72  brain]
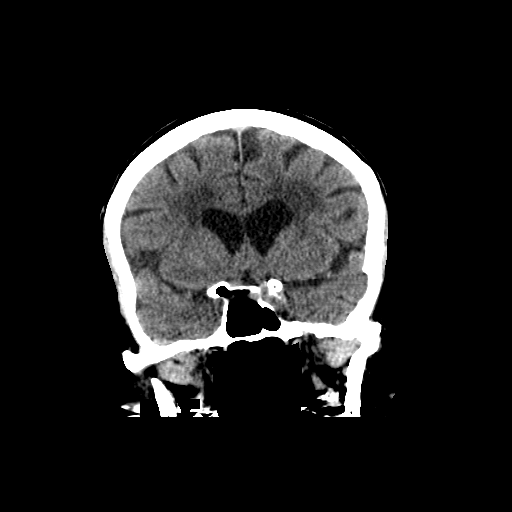
[im 40/72  brain]
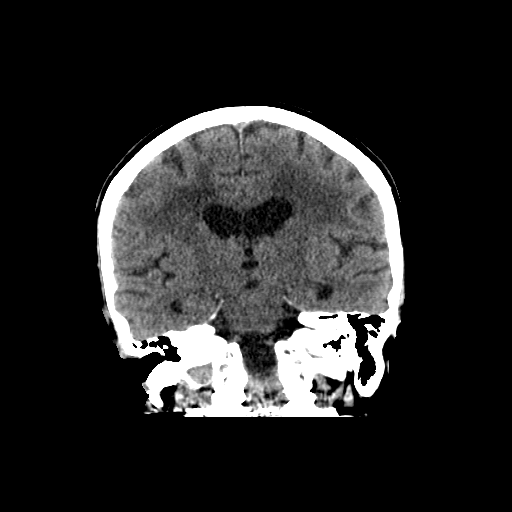

[Series 602: sagittal brain · sagittal · 0.49mm/px · 3 of 59 slices shown]
[im 20/59  brain]
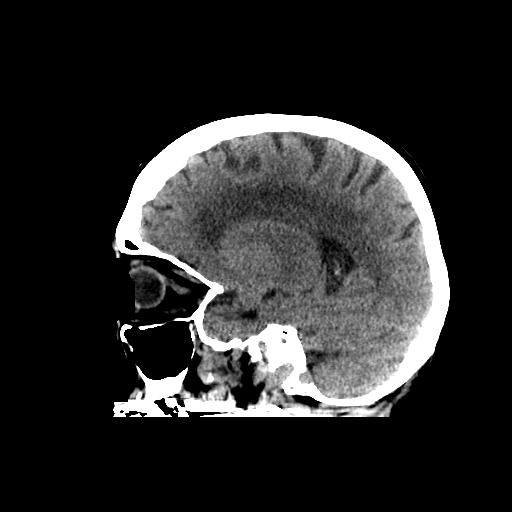
[im 30/59  brain]
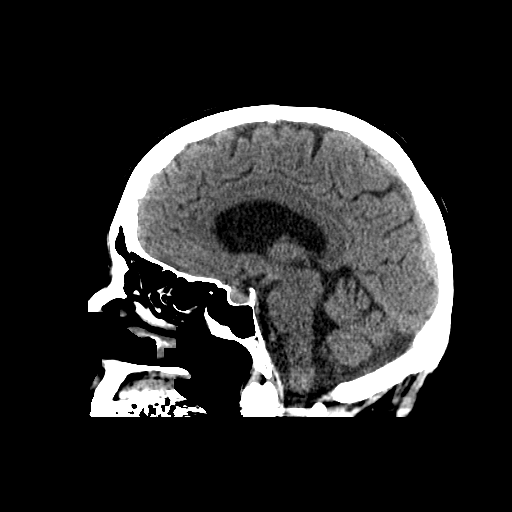
[im 39/59  brain]
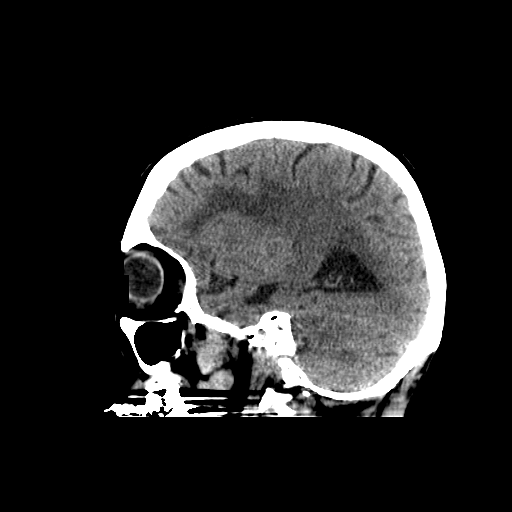

[16 of 47 positions shown; findings below may reference images not displayed]

FINDINGS: Brain: Scattered decreased attenuation is noted throughout the deep
white matter consistent with chronic ischemic change. No significant
atrophic changes are noted. No findings to suggest acute hemorrhage,
acute infarction or space-occupying mass lesion are noted. Old
lacunar infarct is noted the basal ganglia on the right.

Vascular: No hyperdense vessel or unexpected calcification.

Skull: Normal. Negative for fracture or focal lesion.

Sinuses/Orbits: No acute abnormality noted.

Other: None.
IMPRESSION: Chronic changes without acute abnormality.

## 2019-05-15 ENCOUNTER — Other Ambulatory Visit: Payer: Self-pay | Admitting: Nurse Practitioner

## 2019-05-24 ENCOUNTER — Other Ambulatory Visit: Payer: Self-pay

## 2019-05-24 ENCOUNTER — Other Ambulatory Visit: Payer: PPO

## 2019-05-24 DIAGNOSIS — E782 Mixed hyperlipidemia: Secondary | ICD-10-CM | POA: Diagnosis not present

## 2019-05-24 LAB — LIPID PANEL
Cholesterol: 134 mg/dL (ref <200)
HDL: 50 mg/dL (ref >=50)
LDL Cholesterol (Calc): 70 mg/dL (calc)
Non-HDL Cholesterol (Calc): 84 mg/dL (calc) (ref <130)
Total CHOL/HDL Ratio: 2.7 (calc) (ref <5.0)
Triglycerides: 67 mg/dL (ref <150)

## 2019-06-06 DIAGNOSIS — M545 Low back pain: Secondary | ICD-10-CM | POA: Diagnosis not present

## 2019-06-06 DIAGNOSIS — G4733 Obstructive sleep apnea (adult) (pediatric): Secondary | ICD-10-CM | POA: Diagnosis not present

## 2019-06-06 DIAGNOSIS — M6281 Muscle weakness (generalized): Secondary | ICD-10-CM | POA: Diagnosis not present

## 2019-06-06 DIAGNOSIS — N3281 Overactive bladder: Secondary | ICD-10-CM | POA: Diagnosis not present

## 2019-06-06 DIAGNOSIS — D049 Carcinoma in situ of skin, unspecified: Secondary | ICD-10-CM | POA: Diagnosis not present

## 2019-06-06 DIAGNOSIS — R293 Abnormal posture: Secondary | ICD-10-CM | POA: Diagnosis not present

## 2019-06-06 DIAGNOSIS — R2681 Unsteadiness on feet: Secondary | ICD-10-CM | POA: Diagnosis not present

## 2019-06-06 DIAGNOSIS — R29898 Other symptoms and signs involving the musculoskeletal system: Secondary | ICD-10-CM | POA: Diagnosis not present

## 2019-06-09 ENCOUNTER — Encounter: Payer: Self-pay | Admitting: Nurse Practitioner

## 2019-06-09 ENCOUNTER — Non-Acute Institutional Stay: Payer: PPO | Admitting: Nurse Practitioner

## 2019-06-09 ENCOUNTER — Other Ambulatory Visit: Payer: Self-pay

## 2019-06-09 DIAGNOSIS — R232 Flushing: Secondary | ICD-10-CM

## 2019-06-09 DIAGNOSIS — J309 Allergic rhinitis, unspecified: Secondary | ICD-10-CM | POA: Diagnosis not present

## 2019-06-09 DIAGNOSIS — G8929 Other chronic pain: Secondary | ICD-10-CM

## 2019-06-09 DIAGNOSIS — I10 Essential (primary) hypertension: Secondary | ICD-10-CM | POA: Diagnosis not present

## 2019-06-09 DIAGNOSIS — K219 Gastro-esophageal reflux disease without esophagitis: Secondary | ICD-10-CM | POA: Diagnosis not present

## 2019-06-09 DIAGNOSIS — F419 Anxiety disorder, unspecified: Secondary | ICD-10-CM

## 2019-06-09 DIAGNOSIS — M5440 Lumbago with sciatica, unspecified side: Secondary | ICD-10-CM | POA: Diagnosis not present

## 2019-06-09 DIAGNOSIS — M5441 Lumbago with sciatica, right side: Secondary | ICD-10-CM

## 2019-06-09 NOTE — Assessment & Plan Note (Signed)
Managed, continue Tylenol 650mg  qd, prn Tramadol 50mg  q6h prn.

## 2019-06-09 NOTE — Assessment & Plan Note (Signed)
Mild, prn Lorazepam 0.5mg  qd is adequate.

## 2019-06-09 NOTE — Assessment & Plan Note (Signed)
About 1 week, no fever, no respiratory symptoms, she denied dysuria, abd pain, nausea, vomiting, or constipation. Declined CBC/diff, CMP/eGFR today.

## 2019-06-09 NOTE — Patient Instructions (Signed)
F/u with Dr Lyndel Safe in clinic [facility] in 4 months

## 2019-06-09 NOTE — Assessment & Plan Note (Signed)
Blood pressure is controlled, continue Losartan/HCTZ 100/12.5mg  qd.

## 2019-06-09 NOTE — Progress Notes (Signed)
Location:   Clinic [facility]   Place of Service:  Clinic (12) Provider: Marlana Latus NP  Code Status: DNR Goals of Care: [place] Directives 06/09/2019  Does Patient Have a Medical Advance Directive? Yes  Type of Advance Directive Living will;Out of facility DNR (pink MOST or yellow form)  Does patient want to make changes to medical advance directive? No - Patient declined  Copy of [place] in Chart? -  Pre-existing out of facility DNR order (yellow form or pink MOST form) Pink MOST form placed in chart (order not valid for inpatient use)     Chief Complaint  Patient presents with  . Medical Management of Chronic Issues    4 Month follow up, hot flashes started week ago, discuss lab results     HPI: Patient is a 85 y.o. female seen today for medical management of chronic diseases.     The patient has history of OA pain, managed with Tylenol 619m qd, prn Tramadol 59mq6h prn.  Allergic rhinitis, stable on Allegra 18068md, Fluticasone qd. Anxiety, stable, on Lorazepam 0.5mg32m prn. HTN, blood pressure is controlled on Losartan/HCTZ 100/12.5mg 45m GERD, stable on Omeprazole 40mg 60m Past Medical History:  Diagnosis Date  . GERD (gastroesophageal reflux disease)   . Hx of cataract surgery    both eyes  . Hyperglycemia   . Hyperlipidemia   . Hypertension   . Lumbago 04/05/2016  . Macular degeneration    ? which eye(s)  . Obstructive sleep apnea   . Osteoporosis, senile   . Overactive bladder   . Squamous cell skin cancer 2/16   left lower leg    Past Surgical History:  Procedure Laterality Date  . ABDOMINAL HYSTERECTOMY  1960's  . APPENDECTOMY  1960's  . MOLE REMOVAL  2000   facial  . TONSILLECTOMY      Allergies  Allergen Reactions  . Adhesive [Tape]   . Ampicillin   . Nitrofuran Derivatives   . Sulfur   . Zoloft [Sertraline Hcl] Other (See Comments)    Dizziness, nausea, faint feeling, and nightmares      Allergies as of 06/09/2019    Reactions   Adhesive [tape]    Ampicillin    Nitrofuran Derivatives    Sulfur    Zoloft [sertraline Hcl] Other (See Comments)   Dizziness, nausea, faint feeling, and nightmares        Medication List       Accurate as of June 09, 2019  4:33 PM. If you have any questions, ask your nurse or doctor.        STOP taking these medications   BIOFREEZE EX Stopped by: Azani Brogdon X Zoe Creasman, NP   fexofenadine 180 MG tablet Commonly known as: ALLEGRA Stopped by: Dahl Higinbotham X Kaiyan Luczak, NP     TAKE these medications   acetaminophen 325 MG tablet Commonly known as: TYLENOL Take 650 mg by mouth daily.   aspirin EC 81 MG tablet Take 1 tablet (81 mg total) by mouth daily.   atorvastatin 20 MG tablet Commonly known as: LIPITOR TAKE 1 TABLET ONCE DAILY.   Calcium 600+D3 600-800 MG-UNIT Tabs Generic drug: Calcium Carb-Cholecalciferol Take by mouth. Take one daily   Ferrous Sulfate Gran by Does not apply route. 5 gr daily   Fish Oil 1200 MG Caps Take by mouth. Take one daily   fluticasone 50 MCG/ACT nasal spray Commonly known as: FLONASE Place 1 spray into both nostrils daily.   ICaps Caps  Take by mouth. Take one I-Cap daily for eyes   loratadine 10 MG tablet Commonly known as: CLARITIN Take 10 mg by mouth daily.   LORazepam 0.5 MG tablet Commonly known as: ATIVAN TAKE ONE TABLET ONCE DAILY AS NEEDED FOR ANXIETY.   losartan-hydrochlorothiazide 100-12.5 MG tablet Commonly known as: HYZAAR TAKE (1) TABLET DAILY FOR HIGH BLOOD PRESSURE.   omeprazole 40 MG capsule Commonly known as: PRILOSEC Take one capsule by mouth once daily 1/2 hour before breakfast for stomach acid.   Systane 0.4-0.3 % Gel ophthalmic gel Generic drug: Polyethyl Glycol-Propyl Glycol Apply each eye at bedtime   Systane Ultra PF 0.4-0.3 % Soln Generic drug: Polyethyl Glyc-Propyl Glyc PF One application both eyes three times daily.   traMADol 50 MG tablet Commonly known as: ULTRAM Take 1 tablet (50 mg total) by  mouth every 6 (six) hours as needed for severe pain. Pt has this medication at home- no new supply provided.       Review of Systems:  Review of Systems  Constitutional: Negative for activity change, appetite change, chills, diaphoresis, fatigue, fever and unexpected weight change.       Hot flashes.   HENT: Positive for hearing loss. Negative for congestion and voice change.   Respiratory: Negative for cough, shortness of breath and wheezing.   Cardiovascular: Negative for chest pain, palpitations and leg swelling.  Gastrointestinal: Negative for abdominal distention, abdominal pain, constipation, diarrhea, nausea and vomiting.  Genitourinary: Negative for difficulty urinating, dysuria and urgency.       1-2x/night  Musculoskeletal: Positive for back pain and gait problem.       Tylenol  Skin: Negative for color change and pallor.  Neurological: Positive for numbness. Negative for dizziness, weakness and headaches.       Lower leg numbness, better once up and move about. Walker.   Psychiatric/Behavioral: Negative for agitation, confusion and sleep disturbance. The patient is not nervous/anxious.     Health Maintenance  Topic Date Due  . INFLUENZA VACCINE  07/30/2019  . TETANUS/TDAP  12/14/2022  . DEXA SCAN  Completed  . PNA vac Low Risk Adult  Completed    Physical Exam: Vitals:   06/09/19 1429  BP: 132/68  Pulse: 90  Temp: 98.9 F (37.2 C)  TempSrc: Oral  SpO2: 97%  Weight: 189 lb 9.6 oz (86 kg)  Height: _0  (1.676 m)   Body mass index is 30.6 kg/m. Physical Exam Vitals signs and nursing note reviewed.  Constitutional:      General: She is not in acute distress.    Appearance: Normal appearance. She is obese. She is not ill-appearing, toxic-appearing or diaphoretic.  HENT:     Head: Normocephalic and atraumatic.     Nose: Nose normal.     Mouth/Throat:     Mouth: Mucous membranes are moist.  Eyes:     Extraocular Movements: Extraocular movements intact.      Conjunctiva/sclera: Conjunctivae normal.     Pupils: Pupils are equal, round, and reactive to light.  Neck:     Musculoskeletal: Normal range of motion and neck supple.  Cardiovascular:     Rate and Rhythm: Normal rate and regular rhythm.     Heart sounds: No murmur.  Abdominal:     General: Bowel sounds are normal. There is no distension.     Palpations: Abdomen is soft.     Tenderness: There is no abdominal tenderness. There is no right CVA tenderness, left CVA tenderness, guarding or rebound.  Musculoskeletal:  Right lower leg: No edema.     Left lower leg: No edema.     Comments: Ambulates with walker.   Skin:    General: Skin is warm and dry.  Neurological:     General: No focal deficit present.     Mental Status: She is alert and oriented to person, place, and time. Mental status is at baseline.     Cranial Nerves: No cranial nerve deficit.     Motor: No weakness.     Coordination: Coordination normal.     Gait: Gait abnormal.  Psychiatric:        Mood and Affect: Mood normal.        Behavior: Behavior normal.        Thought Content: Thought content normal.        Judgment: Judgment normal.     Labs reviewed: Basic Metabolic Panel: Recent Labs    10/20/18 0000  NA 139  K 3.9  CL 105  CO2 25  GLUCOSE 116*  BUN 29*  CREATININE 0.81  CALCIUM 9.6  TSH 3.26   Liver Function Tests: Recent Labs    10/20/18 0000  AST 23  ALT 21  BILITOT 0.4  PROT 5.9*   No results for input(s): LIPASE, AMYLASE in the last 8760 hours. No results for input(s): AMMONIA in the last 8760 hours. CBC: Recent Labs    10/20/18 0000 02/22/19 0745  WBC 6.5 6.9  HGB 12.0 12.6  HCT 35.7 37.0  MCV 94.9 94.1  PLT 178 214   Lipid Panel: Recent Labs    10/20/18 0000 02/22/19 0745 05/24/19 0700  CHOL 171 202* 134  HDL 40* 45* 50  LDLCALC 110* 135* 70  TRIG 103 111 67  CHOLHDL 4.3 4.5 2.7   Lab Results  Component Value Date   HGBA1C 5.4 05/04/2018    Procedures  since last visit: No results found.  Assessment/Plan  HTN (hypertension) Blood pressure is controlled, continue Losartan/HCTZ 100/12.27m qd.   Allergic rhinitis Stable, continue  Allegra 1834mqd, Fluticasone qd.   GERD (gastroesophageal reflux disease) Stable, continue Omeprazole 4033md  Anxiety Mild, prn Lorazepam 0.5mg7m is adequate.   Lumbago Managed, continue Tylenol 650mg63m prn Tramadol 50mg 49mprn.   Hot flashes About 1 week, no fever, no respiratory symptoms, she denied dysuria, abd pain, nausea, vomiting, or constipation. Declined CBC/diff, CMP/eGFR today.    Labs/tests ordered:  None  Next appt: 4 months with Dr. GuptaLyndel Safe

## 2019-06-09 NOTE — Assessment & Plan Note (Signed)
Stable, continue  Allegra 180mg  qd, Fluticasone qd.

## 2019-06-09 NOTE — Assessment & Plan Note (Signed)
Stable, continue Omeprazole 40mg qd.  

## 2019-06-10 NOTE — Addendum Note (Signed)
Addended by: Despina Hidden on: 06/10/2019 09:58 AM   Modules accepted: Orders

## 2019-06-20 ENCOUNTER — Other Ambulatory Visit: Payer: Self-pay | Admitting: *Deleted

## 2019-06-20 MED ORDER — TRAMADOL HCL 50 MG PO TABS: 50.0000 mg | ORAL_TABLET | Freq: Four times a day (QID) | ORAL | 0 refills | Status: DC | PRN

## 2019-06-20 NOTE — Telephone Encounter (Signed)
Patient called and stated that she hasn't had to get these filled in awhile but requesting a new Rx.   Pended Rx and sent to [facility] for approval.  [place] Database Verified and no Rx in system.

## 2019-06-22 DIAGNOSIS — N8110 Cystocele, unspecified: Secondary | ICD-10-CM | POA: Diagnosis not present

## 2019-06-24 ENCOUNTER — Other Ambulatory Visit: Payer: Self-pay | Admitting: Nurse Practitioner

## 2019-06-24 DIAGNOSIS — F5105 Insomnia due to other mental disorder: Secondary | ICD-10-CM

## 2019-06-24 DIAGNOSIS — F419 Anxiety disorder, unspecified: Secondary | ICD-10-CM

## 2019-06-30 DIAGNOSIS — M6281 Muscle weakness (generalized): Secondary | ICD-10-CM | POA: Diagnosis not present

## 2019-06-30 DIAGNOSIS — D049 Carcinoma in situ of skin, unspecified: Secondary | ICD-10-CM | POA: Diagnosis not present

## 2019-06-30 DIAGNOSIS — R2681 Unsteadiness on feet: Secondary | ICD-10-CM | POA: Diagnosis not present

## 2019-06-30 DIAGNOSIS — N3281 Overactive bladder: Secondary | ICD-10-CM | POA: Diagnosis not present

## 2019-06-30 DIAGNOSIS — R29898 Other symptoms and signs involving the musculoskeletal system: Secondary | ICD-10-CM | POA: Diagnosis not present

## 2019-06-30 DIAGNOSIS — R293 Abnormal posture: Secondary | ICD-10-CM | POA: Diagnosis not present

## 2019-06-30 DIAGNOSIS — M545 Low back pain: Secondary | ICD-10-CM | POA: Diagnosis not present

## 2019-07-04 DIAGNOSIS — G4733 Obstructive sleep apnea (adult) (pediatric): Secondary | ICD-10-CM | POA: Diagnosis not present

## 2019-07-08 ENCOUNTER — Telehealth: Payer: Self-pay | Admitting: *Deleted

## 2019-07-08 NOTE — Telephone Encounter (Signed)
Patient called and stated that she would like to speak with Tiffany or [facility] regarding some concerns:  1. Stated that she had a medication reaction to Tramadol. Reaction was dizziness and lightheadedness. Stated that she hasn't taken anymore. Wants to know if there was an alternative and if she should be still be taking the Tylenol.   2. Stated that she had gotten a notification through the facility about taking the test for COVID 19. Patient stated that she would like to know the cost of the test and stated that she will call her insurance to get that cost.   Please Advise.

## 2019-07-11 ENCOUNTER — Other Ambulatory Visit: Payer: Self-pay | Admitting: Nurse Practitioner

## 2019-07-12 NOTE — Telephone Encounter (Signed)
Mast, Man X, NP  Leigh Aurora C, CMA 1 hour ago (10:53 AM)   Taking Tylenol will be fine, just to remember the total daily dose should be less than 3000mg    Message text        Patient notified and agreed.

## 2019-07-12 NOTE — Telephone Encounter (Signed)
Tiffany or [facility] please advise if any follow-up has take place to discuss concerns with patient.   Thanks, CB

## 2019-08-01 DIAGNOSIS — N3941 Urge incontinence: Secondary | ICD-10-CM | POA: Diagnosis not present

## 2019-08-01 DIAGNOSIS — M6281 Muscle weakness (generalized): Secondary | ICD-10-CM | POA: Diagnosis not present

## 2019-08-01 DIAGNOSIS — R32 Unspecified urinary incontinence: Secondary | ICD-10-CM | POA: Diagnosis not present

## 2019-08-01 DIAGNOSIS — R2681 Unsteadiness on feet: Secondary | ICD-10-CM | POA: Diagnosis not present

## 2019-08-01 DIAGNOSIS — M545 Low back pain: Secondary | ICD-10-CM | POA: Diagnosis not present

## 2019-08-01 DIAGNOSIS — D049 Carcinoma in situ of skin, unspecified: Secondary | ICD-10-CM | POA: Diagnosis not present

## 2019-08-01 DIAGNOSIS — N3281 Overactive bladder: Secondary | ICD-10-CM | POA: Diagnosis not present

## 2019-08-01 DIAGNOSIS — R293 Abnormal posture: Secondary | ICD-10-CM | POA: Diagnosis not present

## 2019-08-01 DIAGNOSIS — R29898 Other symptoms and signs involving the musculoskeletal system: Secondary | ICD-10-CM | POA: Diagnosis not present

## 2019-08-25 ENCOUNTER — Encounter: Payer: Self-pay | Admitting: Nurse Practitioner

## 2019-08-25 ENCOUNTER — Non-Acute Institutional Stay: Payer: PPO | Admitting: Nurse Practitioner

## 2019-08-25 ENCOUNTER — Ambulatory Visit: Payer: Self-pay | Admitting: Nurse Practitioner

## 2019-08-25 ENCOUNTER — Other Ambulatory Visit: Payer: Self-pay

## 2019-08-25 DIAGNOSIS — R0600 Dyspnea, unspecified: Secondary | ICD-10-CM

## 2019-08-25 DIAGNOSIS — I10 Essential (primary) hypertension: Secondary | ICD-10-CM | POA: Diagnosis not present

## 2019-08-25 DIAGNOSIS — L603 Nail dystrophy: Secondary | ICD-10-CM | POA: Diagnosis not present

## 2019-08-25 DIAGNOSIS — F5105 Insomnia due to other mental disorder: Secondary | ICD-10-CM | POA: Diagnosis not present

## 2019-08-25 DIAGNOSIS — M5440 Lumbago with sciatica, unspecified side: Secondary | ICD-10-CM | POA: Diagnosis not present

## 2019-08-25 DIAGNOSIS — K219 Gastro-esophageal reflux disease without esophagitis: Secondary | ICD-10-CM | POA: Diagnosis not present

## 2019-08-25 DIAGNOSIS — R0609 Other forms of dyspnea: Secondary | ICD-10-CM | POA: Diagnosis not present

## 2019-08-25 DIAGNOSIS — F419 Anxiety disorder, unspecified: Secondary | ICD-10-CM | POA: Diagnosis not present

## 2019-08-25 DIAGNOSIS — J309 Allergic rhinitis, unspecified: Secondary | ICD-10-CM | POA: Diagnosis not present

## 2019-08-25 DIAGNOSIS — N3281 Overactive bladder: Secondary | ICD-10-CM | POA: Diagnosis not present

## 2019-08-25 DIAGNOSIS — R232 Flushing: Secondary | ICD-10-CM

## 2019-08-25 DIAGNOSIS — G8929 Other chronic pain: Secondary | ICD-10-CM

## 2019-08-25 NOTE — Assessment & Plan Note (Signed)
Continue Claritin, Flonase.

## 2019-08-25 NOTE — Assessment & Plan Note (Signed)
May obtain CXR, observe

## 2019-08-25 NOTE — Patient Instructions (Signed)
Avoid Tramadol to eliminate possible etiology of hot flashes, obtain labs next week, f/u Dr. Lyndel Safe in 2-3 weeks. May consider CXR for c/o exertional shortness of breath.

## 2019-08-25 NOTE — Assessment & Plan Note (Signed)
Bathroom trip x1, sleeps well, prn Lorazepam 0.5mg  qd.

## 2019-08-25 NOTE — Assessment & Plan Note (Signed)
Stable, continue Omeprazole 40mg qd.  

## 2019-08-25 NOTE — Progress Notes (Signed)
Location:   clinic [facility]   Place of Service:  Clinic (12) Provider: Marlana Latus NP  Code Status: DNR Goals of Care: [place] Advanced Directives 08/25/2019  Does Patient Have a Medical Advance Directive? Yes  Type of Paramedic of [place];Living will;Out of facility DNR (pink MOST or yellow form)  Does patient want to make changes to medical advance directive? No - Patient declined  Copy of [place] in Chart? Yes - validated most recent copy scanned in chart (See row information)  Pre-existing out of facility DNR order (yellow form or pink MOST form) Yellow form placed in chart (order not valid for inpatient use);Pink MOST form placed in chart (order not valid for inpatient use)     Chief Complaint  Patient presents with  . Medicare Wellness    HPI: Patient is a 85 y.o. female seen today for an acute visit for hot flushes about 3 weeks ago, almost daily, Tramadol has possible side effects for Flushing, Diaphoresis. Hx of anxiety, taking Lorazepam 0.11m qd. Left leg pain, taking Tylenol 6535mqd, Tramadol prn. GERD stable Omeprazole 4087md. HTN blood pressure is controlled on Losartan/HCTZ 100/12.5mg49m. Allergic rhinitis, stable, on Claritin 10mg24m Flonase daily. Anemia, on Fe daily.   Past Medical History:  Diagnosis Date  . GERD (gastroesophageal reflux disease)   . Hx of cataract surgery    both eyes  . Hyperglycemia   . Hyperlipidemia   . Hypertension   . Lumbago 04/05/2016  . Macular degeneration    ? which eye(s)  . Obstructive sleep apnea   . Osteoporosis, senile   . Overactive bladder   . Squamous cell skin cancer 2/16   left lower leg    Past Surgical History:  Procedure Laterality Date  . ABDOMINAL HYSTERECTOMY  1960's  . APPENDECTOMY  1960's  . MOLE REMOVAL  2000   facial  . TONSILLECTOMY      Allergies  Allergen Reactions  . Adhesive [Tape]   . Ampicillin   . Nitrofuran Derivatives   . Sulfur   . Zoloft  [Sertraline Hcl] Other (See Comments)    Dizziness, nausea, faint feeling, and nightmares      Allergies as of 08/25/2019      Reactions   Adhesive [tape]    Ampicillin    Nitrofuran Derivatives    Sulfur    Zoloft [sertraline Hcl] Other (See Comments)   Dizziness, nausea, faint feeling, and nightmares        Medication List       Accurate as of August 25, 2019 11:59 PM. If you have any questions, ask your nurse or doctor.        acetaminophen 325 MG tablet Commonly known as: TYLENOL Take 650 mg by mouth daily.   aspirin EC 81 MG tablet Take 1 tablet (81 mg total) by mouth daily.   atorvastatin 20 MG tablet Commonly known as: LIPITOR TAKE 1 TABLET ONCE DAILY.   Calcium 600+D3 600-800 MG-UNIT Tabs Generic drug: Calcium Carb-Cholecalciferol Take 1 tablet by mouth daily.   ferrous sulfate 325 (65 FE) MG EC tablet Take 325 mg by mouth daily with breakfast.   Fish Oil 1200 MG Caps Take 1 capsule by mouth daily.   fluticasone 50 MCG/ACT nasal spray Commonly known as: FLONASE Place 1 spray into both nostrils daily.   ICaps Caps Take 1 capsule by mouth daily.   loratadine 10 MG tablet Commonly known as: CLARITIN Take 10 mg by mouth  daily.   LORazepam 0.5 MG tablet Commonly known as: ATIVAN TAKE ONE TABLET ONCE DAILY AS NEEDED FOR ANXIETY.   losartan-hydrochlorothiazide 100-12.5 MG tablet Commonly known as: HYZAAR TAKE (1) TABLET DAILY FOR HIGH BLOOD PRESSURE.   omeprazole 40 MG capsule Commonly known as: PRILOSEC TAKE 1 CAPSULE DAILY 1/2 HOUR BEFORE BREAKFAST FOR STOMACH ACID.   Systane 0.4-0.3 % Gel ophthalmic gel Generic drug: Polyethyl Glycol-Propyl Glycol Place 1 application into both eyes at bedtime.   Systane Ultra PF 0.4-0.3 % Soln Generic drug: Polyethyl Glyc-Propyl Glyc PF Place 1 application into both eyes 3 (three) times daily.   traMADol 50 MG tablet Commonly known as: ULTRAM Take 1 tablet (50 mg total) by mouth every 6 (six) hours as  needed for severe pain.       Review of Systems:  Review of Systems  Constitutional: Negative for activity change, appetite change, chills, diaphoresis, fatigue, fever and unexpected weight change.  HENT: Positive for hearing loss. Negative for congestion and voice change.   Eyes: Negative for visual disturbance.  Respiratory: Positive for shortness of breath. Negative for cough and wheezing.        DOE  Cardiovascular: Positive for leg swelling. Negative for chest pain and palpitations.  Gastrointestinal: Negative for abdominal distention, abdominal pain, constipation, diarrhea, nausea and vomiting.  Genitourinary: Positive for frequency. Negative for difficulty urinating, dysuria and urgency.  Musculoskeletal: Positive for arthralgias, back pain and gait problem.  Skin: Negative for color change and pallor.  Neurological: Positive for numbness. Negative for dizziness, speech difficulty, weakness and headaches.       Left leg pain, numbness some times-better once up  Psychiatric/Behavioral: Negative for agitation, behavioral problems, hallucinations and sleep disturbance. The patient is not nervous/anxious.     Health Maintenance  Topic Date Due  . INFLUENZA VACCINE  07/30/2019  . TETANUS/TDAP  12/14/2022  . DEXA SCAN  Completed  . PNA vac Low Risk Adult  Completed    Physical Exam: Vitals:   08/25/19 1459  BP: 130/62  Pulse: 99  Resp: 20  Temp: (!) 96.8 F (36 C)  SpO2: 99%  Weight: 183 lb 6.4 oz (83.2 kg)  Height: _0  (1.676 m)   Body mass index is 29.6 kg/m. Physical Exam Vitals signs and nursing note reviewed.  Constitutional:      General: She is not in acute distress.    Appearance: Normal appearance. She is not ill-appearing, toxic-appearing or diaphoretic.     Comments: Over weight  HENT:     Head: Normocephalic and atraumatic.     Nose: Nose normal.     Mouth/Throat:     Mouth: Mucous membranes are moist.  Eyes:     Extraocular Movements:  Extraocular movements intact.     Conjunctiva/sclera: Conjunctivae normal.     Pupils: Pupils are equal, round, and reactive to light.  Neck:     Musculoskeletal: Normal range of motion and neck supple.  Cardiovascular:     Rate and Rhythm: Normal rate and regular rhythm.     Heart sounds: No murmur.  Pulmonary:     Breath sounds: No wheezing, rhonchi or rales.  Abdominal:     Palpations: Abdomen is soft.     Tenderness: There is no abdominal tenderness. There is no right CVA tenderness, guarding or rebound.  Musculoskeletal:     Right lower leg: Edema present.     Left lower leg: No edema.     Comments: Trace edema RLE, ambulates with walker.  Skin:    General: Skin is warm and dry.     Comments: Brittle finger nails.   Neurological:     General: No focal deficit present.     Mental Status: She is alert and oriented to person, place, and time. Mental status is at baseline.     Motor: No weakness.     Coordination: Coordination normal.     Gait: Gait normal.     Deep Tendon Reflexes: Reflexes normal.  Psychiatric:        Mood and Affect: Mood normal.        Behavior: Behavior normal.        Thought Content: Thought content normal.        Judgment: Judgment normal.     Labs reviewed: Basic Metabolic Panel: Recent Labs    10/20/18 0000  NA 139  K 3.9  CL 105  CO2 25  GLUCOSE 116*  BUN 29*  CREATININE 0.81  CALCIUM 9.6  TSH 3.26   Liver Function Tests: Recent Labs    10/20/18 0000  AST 23  ALT 21  BILITOT 0.4  PROT 5.9*   No results for input(s): LIPASE, AMYLASE in the last 8760 hours. No results for input(s): AMMONIA in the last 8760 hours. CBC: Recent Labs    10/20/18 0000 02/22/19 0745  WBC 6.5 6.9  HGB 12.0 12.6  HCT 35.7 37.0  MCV 94.9 94.1  PLT 178 214   Lipid Panel: Recent Labs    10/20/18 0000 02/22/19 0745 05/24/19 0700  CHOL 171 202* 134  HDL 40* 45* 50  LDLCALC 110* 135* 70  TRIG 103 111 67  CHOLHDL 4.3 4.5 2.7   Lab Results   Component Value Date   HGBA1C 5.4 05/04/2018    Procedures since last visit: No results found.  Assessment/Plan Hot flashes hot flushes about 3 weeks ago, almost daily, Tramadol has possible side effects for Flushing, Diaphoresis. 1 hold Tramadol, observe for hot flashes 2 update CBC/diff, CMP/eGFR, TSH   HTN (hypertension) Blood pressure is controlled, continue Losartan/HCT 100/12.10m qd.   GERD (gastroesophageal reflux disease) Stable, continue Omeprazole 4106mqd  Insomnia secondary to anxiety Bathroom trip x1, sleeps well, prn Lorazepam 0.27m6md.   Lumbago And left leg pain, continue Tylenol 650m59m, prn Tramadol, avoid Tramadol since it may be contributory to hot flashes. Observe.   Allergic rhinitis Continue Claritin, Flonase.   DOE (dyspnea on exertion) May obtain CXR, observe  Brittle nails Update labs, observe.     Labs/tests ordered: CBC/diff, CMP/eGFR, TSH  Next appt:  2-3 weeks with Dr. GuptLyndel Safe

## 2019-08-25 NOTE — Assessment & Plan Note (Signed)
Blood pressure is controlled, continue Losartan/HCT 100/12.5mg  qd.

## 2019-08-25 NOTE — Assessment & Plan Note (Signed)
Update labs, observe.

## 2019-08-25 NOTE — Assessment & Plan Note (Signed)
And left leg pain, continue Tylenol 650mg  qd, prn Tramadol, avoid Tramadol since it may be contributory to hot flashes. Observe.

## 2019-08-25 NOTE — Assessment & Plan Note (Signed)
hot flushes about 3 weeks ago, almost daily, Tramadol has possible side effects for Flushing, Diaphoresis. 1 hold Tramadol, observe for hot flashes 2 update CBC/diff, CMP/eGFR, TSH

## 2019-08-26 ENCOUNTER — Encounter: Payer: Self-pay | Admitting: Nurse Practitioner

## 2019-08-30 DIAGNOSIS — N3941 Urge incontinence: Secondary | ICD-10-CM | POA: Diagnosis not present

## 2019-08-30 DIAGNOSIS — M545 Low back pain: Secondary | ICD-10-CM | POA: Diagnosis not present

## 2019-08-30 DIAGNOSIS — R2681 Unsteadiness on feet: Secondary | ICD-10-CM | POA: Diagnosis not present

## 2019-08-30 DIAGNOSIS — M6281 Muscle weakness (generalized): Secondary | ICD-10-CM | POA: Diagnosis not present

## 2019-08-30 DIAGNOSIS — D049 Carcinoma in situ of skin, unspecified: Secondary | ICD-10-CM | POA: Diagnosis not present

## 2019-08-30 DIAGNOSIS — N3281 Overactive bladder: Secondary | ICD-10-CM | POA: Diagnosis not present

## 2019-08-30 DIAGNOSIS — E785 Hyperlipidemia, unspecified: Secondary | ICD-10-CM | POA: Diagnosis not present

## 2019-08-30 DIAGNOSIS — R32 Unspecified urinary incontinence: Secondary | ICD-10-CM | POA: Diagnosis not present

## 2019-09-07 DIAGNOSIS — T7840XA Allergy, unspecified, initial encounter: Secondary | ICD-10-CM | POA: Diagnosis not present

## 2019-09-07 DIAGNOSIS — D225 Melanocytic nevi of trunk: Secondary | ICD-10-CM | POA: Diagnosis not present

## 2019-09-07 DIAGNOSIS — L821 Other seborrheic keratosis: Secondary | ICD-10-CM | POA: Diagnosis not present

## 2019-09-07 DIAGNOSIS — Z85828 Personal history of other malignant neoplasm of skin: Secondary | ICD-10-CM | POA: Diagnosis not present

## 2019-09-07 DIAGNOSIS — L570 Actinic keratosis: Secondary | ICD-10-CM | POA: Diagnosis not present

## 2019-09-07 DIAGNOSIS — D485 Neoplasm of uncertain behavior of skin: Secondary | ICD-10-CM | POA: Diagnosis not present

## 2019-09-08 DIAGNOSIS — N958 Other specified menopausal and perimenopausal disorders: Secondary | ICD-10-CM | POA: Diagnosis not present

## 2019-09-15 ENCOUNTER — Non-Acute Institutional Stay: Payer: PPO | Admitting: Nurse Practitioner

## 2019-09-15 ENCOUNTER — Encounter: Payer: Self-pay | Admitting: Nurse Practitioner

## 2019-09-15 ENCOUNTER — Other Ambulatory Visit: Payer: Self-pay

## 2019-09-15 VITALS — BP 140/68 | HR 101 | Temp 96.2°F | Resp 20 | Ht 66.0 in | Wt 185.4 lb

## 2019-09-15 DIAGNOSIS — Z0000 Encounter for general adult medical examination without abnormal findings: Secondary | ICD-10-CM | POA: Diagnosis not present

## 2019-09-15 NOTE — Progress Notes (Signed)
Subjective:   Yoceline Linz is a 85 y.o. female who presents for Medicare Annual (Subsequent) preventive examination in clinic [facility].  Cardiac Risk Factors include: advanced age (>56men, >49 women);obesity (BMI >30kg/m2)     Objective:     Vitals: BP 140/68   Pulse (!) 101   Temp (!) 96.2 F (35.7 C)   Resp 20   Ht 5\' 6"  (1.676 m)   Wt 185 lb 6.4 oz (84.1 kg)   SpO2 96%   BMI 29.92 kg/m   Body mass index is 29.92 kg/m.  Advanced Directives 08/25/2019 08/25/2019 06/09/2019 07/20/2018 07/20/2018 07/20/2018 06/18/2017  Does Patient Have a Medical Advance Directive? Yes Yes Yes Yes - Yes Yes  Type of Advance Directive [place];Living will;Out of facility DNR (pink MOST or yellow form) [place];Living will;Out of facility DNR (pink MOST or yellow form) Living will;Out of facility DNR (pink MOST or yellow form) Living will Living will Living will [place];Living will  Does patient want to make changes to medical advance directive? No - Patient declined No - Patient declined No - Patient declined No - Patient declined Yes (MAU/Ambulatory/Procedural Areas - Information given) No - Patient declined No - Patient declined  Copy of [place] in Chart? Yes - validated most recent copy scanned in chart (See row information) Yes - validated most recent copy scanned in chart (See row information) - - - - Yes  Pre-existing out of facility DNR order (yellow form or pink MOST form) Yellow form placed in chart (order not valid for inpatient use);Pink MOST form placed in chart (order not valid for inpatient use) - Pink MOST form placed in chart (order not valid for inpatient use) - - - -    Tobacco Social History   Tobacco Use  Smoking Status Never Smoker  Smokeless Tobacco Never Used     Counseling given: Not Answered   Clinical Intake:  Pre-visit preparation completed: Yes  Pain : 0-10 Pain Score: 6   Pain Type: Chronic pain Pain Location: Leg Pain Orientation: Right, Left Pain Descriptors / Indicators: Nagging, Tingling Pain Onset: More than a month ago Pain Frequency: Several days a week Pain Relieving Factors: OTC Tylenol Effect of Pain on Daily Activities: none  Pain Relieving Factors: OTC Tylenol  Nutritional Status: BMI > 30  Obese Nutritional Risks: None  How often do you need to have someone help you when you read instructions, pamphlets, or other written materials from your doctor or pharmacy?: 1 - Never What is the last grade level you completed in school?: high school  Interpreter Needed?: No  Information entered by :: Yoshika Vensel Bretta Bang NP  Past Medical History:  Diagnosis Date  . GERD (gastroesophageal reflux disease)   . Hx of cataract surgery    both eyes  . Hyperglycemia   . Hyperlipidemia   . Hypertension   . Lumbago 04/05/2016  . Macular degeneration    ? which eye(s)  . Obstructive sleep apnea   . Osteoporosis, senile   . Overactive bladder   . Squamous cell skin cancer 2/16   left lower leg   Past Surgical History:  Procedure Laterality Date  . ABDOMINAL HYSTERECTOMY  1960's  . APPENDECTOMY  1960's  . MOLE REMOVAL  2000   facial  . TONSILLECTOMY     Family History  Problem Relation Age of Onset  . Heart disease Mother   . Heart disease Father  Social History   Socioeconomic History  . Marital status: Widowed    Spouse name: Not on file  . Number of children: Not on file  . Years of education: Not on file  . Highest education level: Not on file  Occupational History  . 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 Financial resource strain: Not on file  . Food insecurity    Worry: Not on file    Inability: Not on file  . Transportation needs    Medical: Not on file    Non-medical: Not on file  Tobacco Use  . Smoking status: Never Smoker  . Smokeless tobacco: Never Used  Substance and Sexual Activity  . Alcohol use:  Yes    Comment: occassionally 1/2 glass of wine 1-2 x month  . Drug use: No  . Sexual activity: Never  Lifestyle  . Physical activity    Days per week: Not on file    Minutes per session: Not on file  . Stress: Not on file  Relationships  . Social Herbalist on phone: Not on file    Gets together: Not on file    Attends religious service: Not on file    Active member of club or organization: Not on file    Attends meetings of clubs or organizations: Not on file    Relationship status: Not on file  Other Topics Concern  . Not on file  Social History Narrative   [facility]   Widow   Never smoked   Alcohol occasionally 1/2 glass of wine   Exercise none   POA, Living Will   Walks with walker    Outpatient Encounter Medications as of 09/15/2019  Medication Sig  . acetaminophen (TYLENOL) 325 MG tablet Take 650 mg by mouth daily.  Marland Kitchen aspirin EC 81 MG tablet Take 1 tablet (81 mg total) by mouth daily.  Marland Kitchen atorvastatin (LIPITOR) 20 MG tablet TAKE 1 TABLET ONCE DAILY.  . Calcium Carb-Cholecalciferol (CALCIUM 600+D3) 600-800 MG-UNIT TABS Take 1 tablet by mouth daily.   . ferrous sulfate 325 (65 FE) MG EC tablet Take 325 mg by mouth daily with breakfast.  . fluticasone (FLONASE) 50 MCG/ACT nasal spray Place 1 spray into both nostrils daily.  Marland Kitchen loratadine (CLARITIN) 10 MG tablet Take 10 mg by mouth daily.  Marland Kitchen LORazepam (ATIVAN) 0.5 MG tablet TAKE ONE TABLET ONCE DAILY AS NEEDED FOR ANXIETY.  Marland Kitchen losartan-hydrochlorothiazide (HYZAAR) 100-12.5 MG tablet TAKE (1) TABLET DAILY FOR HIGH BLOOD PRESSURE.  . Multiple Vitamins-Minerals (ICAPS) CAPS Take 1 capsule by mouth daily.   . Omega-3 Fatty Acids (FISH OIL) 1200 MG CAPS Take 1 capsule by mouth daily.   Marland Kitchen omeprazole (PRILOSEC) 40 MG capsule TAKE 1 CAPSULE DAILY 1/2 HOUR BEFORE BREAKFAST FOR STOMACH ACID.  Marland Kitchen Polyethyl Glyc-Propyl Glyc PF (SYSTANE ULTRA PF) 0.4-0.3 % SOLN Place 1 application into both  eyes 3 (three) times daily.   Vladimir Faster Glycol-Propyl Glycol (SYSTANE) 0.4-0.3 % GEL ophthalmic gel Place 1 application into both eyes at bedtime.   . traMADol (ULTRAM) 50 MG tablet Take 1 tablet (50 mg total) by mouth every 6 (six) hours as needed for severe pain.   No facility-administered encounter medications on file as of 09/15/2019.     Activities of Daily Living In your present state of health, do you have any difficulty performing the following activities: 09/15/2019  Hearing? Y  Comment no hearing aids.  Vision? N  Difficulty concentrating or making decisions? N  Walking or climbing stairs? Y  Dressing or bathing? N  Doing errands, shopping? N  Preparing Food and eating ? N  Using the Toilet? N  In the past six months, have you accidently leaked urine? Y  Do you have problems with loss of bowel control? N  Managing your Medications? N  Managing your Finances? N  Housekeeping or managing your Housekeeping? N  Some recent data might be hidden    Patient Care Team: Lorel Lembo X, NP as PCP - General (Internal Medicine) Perlie Scheuring X, NP as Nurse Practitioner (Internal Medicine)    Assessment:   This is a routine wellness examination for Orit.  Exercise Activities and Dietary recommendations Current Exercise Habits: Home exercise routine, Type of exercise: walking, Time (Minutes): 30, Frequency (Times/Week): 7, Weekly Exercise (Minutes/Week): 210, Intensity: Mild, Exercise limited by: orthopedic condition(s)  Goals    . DIET - REDUCE SUGAR INTAKE    . Increase physical activity       Fall Risk Fall Risk  06/09/2019 10/28/2018 07/20/2018 05/04/2017 03/26/2017  Falls in the past year? 0 No No No No  Number falls in past yr: 0 - - - -  Injury with Fall? 0 - - - -   Is the patient's home free of loose throw rugs in walkways, pet beds, electrical cords, etc?   no      Grab bars in the bathroom? yes      Handrails on the stairs?   yes      Adequate lighting?   yes  Timed  Get Up and Go performed: 8 seconds.   Depression Screen PHQ 2/9 Scores 10/28/2018 11/03/2017 09/23/2016 05/29/2016  PHQ - 2 Score 0 0 1 0     Cognitive Function MMSE - Mini Mental State Exam 09/15/2019 08/25/2019 07/20/2018 11/03/2017 07/03/2016  Not completed: (No Data) (No Data) (No Data) (No Data) -  Orientation to time 4 5 5 5 5   Orientation to Place 5 5 5 5 5   Registration 3 3 3 3 3   Attention/ Calculation 5 5 5 5 5   Recall 3 0 1 2 2   Language- name 2 objects 2 2 2 2 2   Language- repeat 1 1 1 1 1   Language- follow 3 step command 3 3 3 3 3   Language- read & follow direction 1 1 1 1 1   Write a sentence 1 1 1 1 1   Copy design 1 1 1 1 1   Total score 29 27 28 29 29         Immunization History  Administered Date(s) Administered  . Influenza Whole 09/30/2018  . Influenza, High Dose Seasonal PF 10/07/2017  . Influenza-Unspecified 09/27/2015, 10/09/2016  . Pneumococcal Conjugate-13 01/23/2015  . Pneumococcal Polysaccharide-23 10/20/2007  . Td 12/14/2012    Qualifies for Shingles Vaccine? Pending the 2nd dose of Shingrix  Screening Tests Health Maintenance  Topic Date Due  . INFLUENZA VACCINE  07/30/2019  . TETANUS/TDAP  12/14/2022  . DEXA SCAN  Completed  . PNA vac Low Risk Adult  Completed    Cancer Screenings: Lung: Low Dose CT Chest recommended if Age 79-80 years, 30 pack-year currently smoking OR have quit w/in 15years. Patient does not  qualify. Breast:  Up to date on Mammogram? Not qualify Up to date of Bone Density/Dexa? Yes Colorectal: not qualify  Additional Screenings: none Hepatitis C Screening:      Plan:    pending 2nd dose of Singrix  I have personally reviewed and noted the following in the patient's chart:   . Medical and social history . Use of alcohol, tobacco or illicit drugs  . Current medications and supplements . Functional ability and status . Nutritional status . Physical activity . Advanced directives . List of other physicians .  Hospitalizations, surgeries, and ER visits in previous 12 months . Vitals . Screenings to include cognitive, depression, and falls . Referrals and appointments  In addition, I have reviewed and discussed with patient certain preventive protocols, quality metrics, and best practice recommendations. A written personalized care plan for preventive services as well as general preventive health recommendations were provided to patient.     Anylah Scheib X Rhyse Skowron, NP  09/16/2019

## 2019-09-15 NOTE — Patient Instructions (Addendum)
Jean [name] , Thank you for taking time to come for your Medicare Wellness Visit. I appreciate your ongoing commitment to your health goals. Please review the following plan we discussed and let me know if I can assist you in the future.   Screening recommendations/referrals: Colonoscopy none Mammogram none Bone Density up to date Recommended yearly ophthalmology/optometry visit for glaucoma screening and checkup Recommended yearly dental visit for hygiene and checkup  Vaccinations: Influenza vaccine up to date Pneumococcal vaccine up to date Tdap vaccine up to date Shingles vaccine pending 2nd dose of Shingrix  Advanced directives: up to date  Conditions/risks identified: yes  Next appointment: one year   Preventive Care 2 Years and Older, Female Preventive care refers to lifestyle choices and visits with your health care provider that can promote health and wellness. What does preventive care include?  A yearly physical exam. This is also called an annual well check.  Dental exams once or twice a year.  Routine eye exams. Ask your health care provider how often you should have your eyes checked.  Personal lifestyle choices, including:  Daily care of your teeth and gums.  Regular physical activity.  Eating a healthy diet.  Avoiding tobacco and drug use.  Limiting alcohol use.  Practicing safe sex.  Taking low-dose aspirin every day.  Taking vitamin and mineral supplements as recommended by your health care provider. What happens during an annual well check? The services and screenings done by your health care provider during your annual well check will depend on your age, overall health, lifestyle risk factors, and family history of disease. Counseling  Your health care provider may ask you questions about your:  Alcohol use.  Tobacco use.  Drug use.  Emotional well-being.  Home and relationship well-being.  Sexual activity.  Eating habits.   History of falls.  Memory and ability to understand (cognition).  Work and work Statistician.  Reproductive health. Screening  You may have the following tests or measurements:  Height, weight, and BMI.  Blood pressure.  Lipid and cholesterol levels. These may be checked every 5 years, or more frequently if you are over 66 years old.  Skin check.  Lung cancer screening. You may have this screening every year starting at age 49 if you have a 30-pack-year history of smoking and currently smoke or have quit within the past 15 years.  Fecal occult blood test (FOBT) of the stool. You may have this test every year starting at age 52.  Flexible sigmoidoscopy or colonoscopy. You may have a sigmoidoscopy every 5 years or a colonoscopy every 10 years starting at age 32.  Hepatitis C blood test.  Hepatitis B blood test.  Sexually transmitted disease (STD) testing.  Diabetes screening. This is done by checking your blood sugar (glucose) after you have not eaten for a while (fasting). You may have this done every 1-3 years.  Bone density scan. This is done to screen for osteoporosis. You may have this done starting at age 42.  Mammogram. This may be done every 1-2 years. Talk to your health care provider about how often you should have regular mammograms. Talk with your health care provider about your test results, treatment options, and if necessary, the need for more tests. Vaccines  Your health care provider may recommend certain vaccines, such as:  Influenza vaccine. This is recommended every year.  Tetanus, diphtheria, and acellular pertussis (Tdap, Td) vaccine. You may need a Td booster every 10 years.  Zoster vaccine. You  may need this after age 70.  Pneumococcal 13-valent conjugate (PCV13) vaccine. One dose is recommended after age 35.  Pneumococcal polysaccharide (PPSV23) vaccine. One dose is recommended after age 59. Talk to your health care provider about which screenings  and vaccines you need and how often you need them. This information is not intended to replace advice given to you by your health care provider. Make sure you discuss any questions you have with your health care provider. Document Released: 01/11/2016 Document Revised: 09/03/2016 Document Reviewed: 10/16/2015 Elsevier Interactive Patient Education  2017 [place] Prevention in the Home Falls can cause injuries. They can happen to people of all ages. There are many things you can do to make your home safe and to help prevent falls. What can I do on the outside of my home?  Regularly fix the edges of walkways and driveways and fix any cracks.  Remove anything that might make you trip as you walk through a door, such as a raised step or threshold.  Trim any bushes or trees on the path to your home.  Use bright outdoor lighting.  Clear any walking paths of anything that might make someone trip, such as rocks or tools.  Regularly check to see if handrails are loose or broken. Make sure that both sides of any steps have handrails.  Any raised decks and porches should have guardrails on the edges.  Have any leaves, snow, or ice cleared regularly.  Use sand or salt on walking paths during winter.  Clean up any spills in your garage right away. This includes oil or grease spills. What can I do in the bathroom?  Use night lights.  Install grab bars by the toilet and in the tub and shower. Do not use towel bars as grab bars.  Use non-skid mats or decals in the tub or shower.  If you need to sit down in the shower, use a plastic, non-slip stool.  Keep the floor dry. Clean up any water that spills on the floor as soon as it happens.  Remove soap buildup in the tub or shower regularly.  Attach bath mats securely with double-sided non-slip rug tape.  Do not have throw rugs and other things on the floor that can make you trip. What can I do in the bedroom?  Use night lights.   Make sure that you have a light by your bed that is easy to reach.  Do not use any sheets or blankets that are too big for your bed. They should not hang down onto the floor.  Have a firm chair that has side arms. You can use this for support while you get dressed.  Do not have throw rugs and other things on the floor that can make you trip. What can I do in the kitchen?  Clean up any spills right away.  Avoid walking on wet floors.  Keep items that you use a lot in easy-to-reach places.  If you need to reach something above you, use a strong step stool that has a grab bar.  Keep electrical cords out of the way.  Do not use floor polish or wax that makes floors slippery. If you must use wax, use non-skid floor wax.  Do not have throw rugs and other things on the floor that can make you trip. What can I do with my stairs?  Do not leave any items on the stairs.  Make sure that there are handrails on both  sides of the stairs and use them. Fix handrails that are broken or loose. Make sure that handrails are as long as the stairways.  Check any carpeting to make sure that it is firmly attached to the stairs. Fix any carpet that is loose or worn.  Avoid having throw rugs at the top or bottom of the stairs. If you do have throw rugs, attach them to the floor with carpet tape.  Make sure that you have a light switch at the top of the stairs and the bottom of the stairs. If you do not have them, ask someone to add them for you. What else can I do to help prevent falls?  Wear shoes that:  Do not have high heels.  Have rubber bottoms.  Are comfortable and fit you well.  Are closed at the toe. Do not wear sandals.  If you use a stepladder:  Make sure that it is fully opened. Do not climb a closed stepladder.  Make sure that both sides of the stepladder are locked into place.  Ask someone to hold it for you, if possible.  Clearly mark and make sure that you can see:  Any  grab bars or handrails.  First and last steps.  Where the edge of each step is.  Use tools that help you move around (mobility aids) if they are needed. These include:  Canes.  Walkers.  Scooters.  Crutches.  Turn on the lights when you go into a dark area. Replace any light bulbs as soon as they burn out.  Set up your furniture so you have a clear path. Avoid moving your furniture around.  If any of your floors are uneven, fix them.  If there are any pets around you, be aware of where they are.  Review your medicines with your doctor. Some medicines can make you feel dizzy. This can increase your chance of falling. Ask your doctor what other things that you can do to help prevent falls. This information is not intended to replace advice given to you by your health care provider. Make sure you discuss any questions you have with your health care provider. Document Released: 10/11/2009 Document Revised: 05/22/2016 Document Reviewed: 01/19/2015 Elsevier Interactive Patient Education  2017 [employer].

## 2019-09-16 ENCOUNTER — Encounter: Payer: Self-pay | Admitting: Nurse Practitioner

## 2019-09-29 DIAGNOSIS — D049 Carcinoma in situ of skin, unspecified: Secondary | ICD-10-CM | POA: Diagnosis not present

## 2019-09-29 DIAGNOSIS — M545 Low back pain: Secondary | ICD-10-CM | POA: Diagnosis not present

## 2019-09-29 DIAGNOSIS — N3281 Overactive bladder: Secondary | ICD-10-CM | POA: Diagnosis not present

## 2019-09-29 DIAGNOSIS — R32 Unspecified urinary incontinence: Secondary | ICD-10-CM | POA: Diagnosis not present

## 2019-09-29 DIAGNOSIS — E785 Hyperlipidemia, unspecified: Secondary | ICD-10-CM | POA: Diagnosis not present

## 2019-09-29 DIAGNOSIS — N3943 Post-void dribbling: Secondary | ICD-10-CM | POA: Diagnosis not present

## 2019-09-29 DIAGNOSIS — M6281 Muscle weakness (generalized): Secondary | ICD-10-CM | POA: Diagnosis not present

## 2019-09-29 DIAGNOSIS — R2681 Unsteadiness on feet: Secondary | ICD-10-CM | POA: Diagnosis not present

## 2019-10-03 ENCOUNTER — Other Ambulatory Visit: Payer: Self-pay | Admitting: Nurse Practitioner

## 2019-10-14 ENCOUNTER — Non-Acute Institutional Stay: Payer: PPO | Admitting: Internal Medicine

## 2019-10-14 ENCOUNTER — Encounter: Payer: Self-pay | Admitting: Internal Medicine

## 2019-10-14 ENCOUNTER — Other Ambulatory Visit: Payer: Self-pay

## 2019-10-14 VITALS — BP 136/70 | HR 97 | Temp 96.9°F | Ht 66.0 in | Wt 191.6 lb

## 2019-10-14 DIAGNOSIS — E782 Mixed hyperlipidemia: Secondary | ICD-10-CM | POA: Diagnosis not present

## 2019-10-14 DIAGNOSIS — D509 Iron deficiency anemia, unspecified: Secondary | ICD-10-CM

## 2019-10-14 DIAGNOSIS — I10 Essential (primary) hypertension: Secondary | ICD-10-CM

## 2019-10-14 DIAGNOSIS — R232 Flushing: Secondary | ICD-10-CM

## 2019-10-14 DIAGNOSIS — E663 Overweight: Secondary | ICD-10-CM

## 2019-10-14 DIAGNOSIS — K219 Gastro-esophageal reflux disease without esophagitis: Secondary | ICD-10-CM

## 2019-10-14 DIAGNOSIS — G8929 Other chronic pain: Secondary | ICD-10-CM

## 2019-10-14 DIAGNOSIS — M5440 Lumbago with sciatica, unspecified side: Secondary | ICD-10-CM

## 2019-10-14 NOTE — Progress Notes (Signed)
Location:  [place] of Service:  Clinic (12)  Provider:   Code Status:  Goals of Care:  Advanced Directives 08/25/2019  Does Patient Have a Medical Advance Directive? Yes  Type of Paramedic of [place];Living will;Out of facility DNR (pink MOST or yellow form)  Does patient want to make changes to medical advance directive? No - Patient declined  Copy of [place] in Chart? Yes - validated most recent copy scanned in chart (See row information)  Pre-existing out of facility DNR order (yellow form or pink MOST form) Yellow form placed in chart (order not valid for inpatient use);Pink MOST form placed in chart (order not valid for inpatient use)     Chief Complaint  Patient presents with  . Medical Management of Chronic Issues    4 Month follow up. She is still having occasional hot flashes. She has not had her second round of shingles and hesitant because of reactions she has heard about.     HPI: Patient is a 85 y.o. female seen today for medical management of chronic diseases.   Patient has h/o hypertension, Hyperlipidemia,Anxiety,Anemia,GERD, Arthritis  Doing well. Denies any discomfort with Hot Flashes She has gained some weight she states that since Pandemic she has been walking regularly. She lives by herself in [place].  Has a son who lives an hour away.  Is very independent in her ADLs and IADLs.  Uses walker   Past Medical History:  Diagnosis Date  . GERD (gastroesophageal reflux disease)   . Hx of cataract surgery    both eyes  . Hyperglycemia   . Hyperlipidemia   . Hypertension   . Lumbago 04/05/2016  . Macular degeneration    ? which eye(s)  . Obstructive sleep apnea   . Osteoporosis, senile   . Overactive bladder   . Squamous cell skin cancer 2/16   left lower leg    Past Surgical History:  Procedure Laterality Date  . ABDOMINAL HYSTERECTOMY  1960's  . APPENDECTOMY  1960's  . MOLE REMOVAL   2000   facial  . TONSILLECTOMY      Allergies  Allergen Reactions  . Adhesive [Tape]   . Ampicillin   . Nitrofuran Derivatives   . Sulfur   . Zoloft [Sertraline Hcl] Other (See Comments)    Dizziness, nausea, faint feeling, and nightmares      Outpatient Encounter Medications as of 10/14/2019  Medication Sig  . acetaminophen (TYLENOL) 325 MG tablet Take 650 mg by mouth daily.  Marland Kitchen aspirin EC 81 MG tablet Take 1 tablet (81 mg total) by mouth daily.  Marland Kitchen atorvastatin (LIPITOR) 20 MG tablet TAKE 1 TABLET ONCE DAILY.  . Calcium Carb-Cholecalciferol (CALCIUM 600+D3) 600-800 MG-UNIT TABS Take 1 tablet by mouth daily.   . ferrous sulfate 325 (65 FE) MG EC tablet Take 325 mg by mouth daily with breakfast.  . fluticasone (FLONASE) 50 MCG/ACT nasal spray Place 1 spray into both nostrils daily.  Marland Kitchen loratadine (CLARITIN) 10 MG tablet Take 10 mg by mouth daily.  Marland Kitchen LORazepam (ATIVAN) 0.5 MG tablet TAKE ONE TABLET ONCE DAILY AS NEEDED FOR ANXIETY.  Marland Kitchen losartan-hydrochlorothiazide (HYZAAR) 100-12.5 MG tablet TAKE (1) TABLET DAILY FOR HIGH BLOOD PRESSURE.  . Multiple Vitamins-Minerals (ICAPS) CAPS Take 1 capsule by mouth daily.   . Omega-3 Fatty Acids (FISH OIL) 1200 MG CAPS Take 1 capsule by mouth daily.   Marland Kitchen omeprazole (PRILOSEC) 40 MG capsule TAKE 1 CAPSULE DAILY  1/2 HOUR BEFORE BREAKFAST FOR STOMACH ACID.  Marland Kitchen Polyethyl Glyc-Propyl Glyc PF (SYSTANE ULTRA PF) 0.4-0.3 % SOLN Place 1 application into both eyes 3 (three) times daily.   Vladimir Faster Glycol-Propyl Glycol (SYSTANE) 0.4-0.3 % GEL ophthalmic gel Place 1 application into both eyes at bedtime.   . traMADol (ULTRAM) 50 MG tablet Take 1 tablet (50 mg total) by mouth every 6 (six) hours as needed for severe pain.   No facility-administered encounter medications on file as of 10/14/2019.     Review of Systems:  Review of Systems  Review of Systems  Constitutional: Negative for activity change, appetite change, chills, diaphoresis, fatigue and  fever.  HENT: Negative for mouth sores, postnasal drip, rhinorrhea, sinus pain and sore throat.   Respiratory: Negative for apnea, cough, chest tightness, shortness of breath and wheezing.   Cardiovascular: Negative for chest pain, palpitations and leg swelling.  Gastrointestinal: Negative for abdominal distention, abdominal pain, constipation, diarrhea, nausea and vomiting.  Genitourinary: Negative for dysuria and frequency.  Musculoskeletal: Negative for arthralgias, joint swelling and myalgias.  Skin: Negative for rash.  Neurological: Negative for dizziness, syncope, weakness, light-headedness and numbness.  Psychiatric/Behavioral: Negative for behavioral problems, confusion and sleep disturbance.     Health Maintenance  Topic Date Due  . TETANUS/TDAP  12/14/2022  . INFLUENZA VACCINE  Completed  . DEXA SCAN  Completed  . PNA vac Low Risk Adult  Completed    Physical Exam: Vitals:   10/14/19 0958  BP: 136/70  Pulse: 97  Temp: (!) 96.9 F (36.1 C)  SpO2: 95%  Weight: 191 lb 9.6 oz (86.9 kg)  Height: 5\' 6"  (1.676 m)   Body mass index is 30.93 kg/m. Physical Exam  Constitutional: Oriented to person, place, and time. Well-developed and well-nourished.  HENT:  Head: Normocephalic.  Mouth/Throat: Oropharynx is clear and moist.  Eyes: Pupils are equal, round, and reactive to light.  Neck: Neck supple.  Cardiovascular: Normal rate and normal heart sounds.  No murmur heard. Pulmonary/Chest: Effort normal and breath sounds normal. No respiratory distress. No wheezes. She has no rales.  Abdominal: Soft. Bowel sounds are normal. No distension. There is no tenderness. There is no rebound.  Musculoskeletal: Mild edema Bilatera Lymphadenopathy: none Neurological: Alert and oriented to person, place, and time. No Focal Deficits Little slow in walking but steady Skin: Skin is warm and dry.  Psychiatric: Normal mood and affect. Behavior is normal. Thought content normal.    Labs  reviewed: Basic Metabolic Panel: Recent Labs    10/20/18 0000  NA 139  K 3.9  CL 105  CO2 25  GLUCOSE 116*  BUN 29*  CREATININE 0.81  CALCIUM 9.6  TSH 3.26   Liver Function Tests: Recent Labs    10/20/18 0000  AST 23  ALT 21  BILITOT 0.4  PROT 5.9*   No results for input(s): LIPASE, AMYLASE in the last 8760 hours. No results for input(s): AMMONIA in the last 8760 hours. CBC: Recent Labs    10/20/18 0000 02/22/19 0745  WBC 6.5 6.9  HGB 12.0 12.6  HCT 35.7 37.0  MCV 94.9 94.1  PLT 178 214   Lipid Panel: Recent Labs    10/20/18 0000 02/22/19 0745 05/24/19 0700  CHOL 171 202* 134  HDL 40* 45* 50  LDLCALC 110* 135* 70  TRIG 103 111 67  CHOLHDL 4.3 4.5 2.7   Lab Results  Component Value Date   HGBA1C 5.4 05/04/2018    Procedures since last visit: No results  found.  Assessment/Plan Essential hypertension Controlled on Hyzaar   Gastroesophageal reflux disease without esophagitis Continue Prilosec  Hot flashes She says they are better  Chronic bilateral low back pain Uses Tramdol PRN  Mixed hyperlipidemia On Lipitor Last LDL 70  Iron deficiency anemia, Hgb has been Normal I have told her to use Iron only 3/week. Overweight (BMI 25.0-29.9) Has Gained more weight since last visit Discussed about walking more  Last MMSE was 29/30 Uptodate on Vaccination  Patient needs Follow up labs but she is going to call us to make appointment   Next appt:  02/16/2020 With Manxi   Total time spent in this patient care encounter was  25_  minutes; greater than 50% of the visit spent counseling patient and staff, reviewing records , Labs and coordinating care for problems addressed at this encounter.

## 2019-10-20 ENCOUNTER — Other Ambulatory Visit: Payer: Self-pay | Admitting: Nurse Practitioner

## 2019-11-07 ENCOUNTER — Other Ambulatory Visit: Payer: Self-pay | Admitting: Nurse Practitioner

## 2019-12-07 DIAGNOSIS — N3281 Overactive bladder: Secondary | ICD-10-CM | POA: Diagnosis not present

## 2019-12-07 DIAGNOSIS — N8110 Cystocele, unspecified: Secondary | ICD-10-CM | POA: Diagnosis not present

## 2019-12-16 DIAGNOSIS — G4733 Obstructive sleep apnea (adult) (pediatric): Secondary | ICD-10-CM | POA: Diagnosis not present

## 2020-01-01 ENCOUNTER — Other Ambulatory Visit: Payer: Self-pay | Admitting: Nurse Practitioner

## 2020-01-02 ENCOUNTER — Other Ambulatory Visit: Payer: Self-pay | Admitting: Nurse Practitioner

## 2020-01-02 NOTE — Telephone Encounter (Addendum)
Tramadol refill request. Last seen 10/14/19.  Last refill 06/20/19.

## 2020-01-05 DIAGNOSIS — G4733 Obstructive sleep apnea (adult) (pediatric): Secondary | ICD-10-CM | POA: Diagnosis not present

## 2020-01-18 ENCOUNTER — Other Ambulatory Visit: Payer: Self-pay | Admitting: Internal Medicine

## 2020-02-02 ENCOUNTER — Other Ambulatory Visit: Payer: Self-pay

## 2020-02-02 ENCOUNTER — Non-Acute Institutional Stay: Payer: PPO | Admitting: Nurse Practitioner

## 2020-02-02 ENCOUNTER — Encounter: Payer: Self-pay | Admitting: Nurse Practitioner

## 2020-02-02 DIAGNOSIS — I10 Essential (primary) hypertension: Secondary | ICD-10-CM

## 2020-02-02 DIAGNOSIS — G8929 Other chronic pain: Secondary | ICD-10-CM | POA: Diagnosis not present

## 2020-02-02 DIAGNOSIS — Z7189 Other specified counseling: Secondary | ICD-10-CM | POA: Diagnosis not present

## 2020-02-02 DIAGNOSIS — M5440 Lumbago with sciatica, unspecified side: Secondary | ICD-10-CM | POA: Diagnosis not present

## 2020-02-02 NOTE — Patient Instructions (Addendum)
Medication list printed and provided to the patient. DNR MOST explained, discussed, questions answered. The patient desires to discuss the decision further with her children.

## 2020-02-02 NOTE — Assessment & Plan Note (Addendum)
Current : DNR, comfort measures, antibiotics if indicated, IVF if indicated, feeding tube for a defined trial period 02/02/20 DNR explained, discussed, questions answered. The patient desires to discuss the decision further with her children.   Time spend 25 minutes.

## 2020-02-02 NOTE — Progress Notes (Signed)
Location:   clinic [place]   Place of Service:  Clinic (12) Provider: Marlana Latus NP  Code Status: DNR Goals of Care: [place] Advanced Directives 08/25/2019  Does Patient Have a Medical Advance Directive? Yes  Type of Paramedic of [place];Living will;Out of facility DNR (pink MOST or yellow form)  Does patient want to make changes to medical advance directive? No - Patient declined  Copy of [place] in Chart? Yes - validated most recent copy scanned in chart (See row information)  Pre-existing out of facility DNR order (yellow form or pink MOST form) Yellow form placed in chart (order not valid for inpatient use);Pink MOST form placed in chart (order not valid for inpatient use)     Chief Complaint  Patient presents with  . Medical Management of Chronic Issues    Update DNR and MOST forms and pick up copy of med list.    HPI: Patient is a 86 y.o. female seen today for advance care planning/consuling discussion.   The patient resides in [place] [facility], ambulates with walker, chronic lower back pain,  on Tramadol, Tylenol for aches/pains. HTN, blood pressure is controlled on Losartan/HCTZ 100/12.5mg  qd.    Past Medical History:  Diagnosis Date  . GERD (gastroesophageal reflux disease)   . Hx of cataract surgery    both eyes  . Hyperglycemia   . Hyperlipidemia   . Hypertension   . Lumbago 04/05/2016  . Macular degeneration    ? which eye(s)  . Obstructive sleep apnea   . Osteoporosis, senile   . Overactive bladder   . Squamous cell skin cancer 2/16   left lower leg    Past Surgical History:  Procedure Laterality Date  . ABDOMINAL HYSTERECTOMY  1960's  . APPENDECTOMY  1960's  . MOLE REMOVAL  2000   facial  . TONSILLECTOMY      Allergies  Allergen Reactions  . Adhesive [Tape]   . Ampicillin   . Nitrofuran Derivatives   . Sulfur   . Zoloft [Sertraline Hcl] Other (See Comments)    Dizziness, nausea, faint feeling, and nightmares       Allergies as of 02/02/2020      Reactions   Adhesive [tape]    Ampicillin    Nitrofuran Derivatives    Sulfur    Zoloft [sertraline Hcl] Other (See Comments)   Dizziness, nausea, faint feeling, and nightmares        Medication List       Accurate as of February 02, 2020 11:59 PM. If you have any questions, ask your nurse or doctor.        STOP taking these medications   ferrous sulfate 325 (65 FE) MG EC tablet Stopped by: Eshika Reckart X Fredi Hurtado, NP     TAKE these medications   acetaminophen 325 MG tablet Commonly known as: TYLENOL Take 650 mg by mouth daily.   aspirin EC 81 MG tablet Take 1 tablet (81 mg total) by mouth daily.   atorvastatin 20 MG tablet Commonly known as: LIPITOR TAKE 1 TABLET ONCE DAILY.   Calcium 600+D3 600-800 MG-UNIT Tabs Generic drug: Calcium Carb-Cholecalciferol Take 1 tablet by mouth 2 (two) times daily.   Fish Oil 1200 MG Caps Take 1 capsule by mouth daily.   fluticasone 50 MCG/ACT nasal spray Commonly known as: FLONASE Place 1 spray into both nostrils daily.   ICaps Caps Take 1 capsule by mouth daily.   loratadine 10 MG tablet Commonly known as: CLARITIN Take  10 mg by mouth daily.   LORazepam 0.5 MG tablet Commonly known as: ATIVAN TAKE ONE TABLET ONCE DAILY AS NEEDED FOR ANXIETY.   losartan-hydrochlorothiazide 100-12.5 MG tablet Commonly known as: HYZAAR TAKE (1) TABLET DAILY FOR HIGH BLOOD PRESSURE.   omeprazole 40 MG capsule Commonly known as: PRILOSEC TAKE 1 CAPSULE DAILY 1/2 HOUR BEFORE BREAKFAST FOR STOMACH ACID.   Systane 0.4-0.3 % Gel ophthalmic gel Generic drug: Polyethyl Glycol-Propyl Glycol Place 1 application into both eyes at bedtime. What changed: Another medication with the same name was removed. Continue taking this medication, and follow the directions you see here. Changed by: Sylvain Hasten X Johnnathan Hagemeister, NP   traMADol 50 MG tablet Commonly known as: ULTRAM TAKE (1) TABLET EVERY SIX HOURS AS NEEDED FOR SEVERE PAIN.    - MAY  MAKE DROWSY -       Review of Systems:  Review of Systems  Constitutional: Negative for activity change, appetite change, chills, diaphoresis, fatigue and fever.  HENT: Positive for hearing loss. Negative for congestion.   Eyes: Negative for visual disturbance.  Respiratory: Negative for cough, shortness of breath and wheezing.   Cardiovascular: Positive for leg swelling.  Gastrointestinal: Negative for abdominal distention, abdominal pain, constipation, diarrhea, nausea and vomiting.  Genitourinary: Negative for difficulty urinating, dysuria and urgency.       0-1x/night urination  Musculoskeletal: Positive for arthralgias, back pain and gait problem.  Skin: Negative for color change and pallor.  Neurological: Negative for dizziness, speech difficulty, weakness and headaches.       Tingling in toes sometimes, better after moves around.   Psychiatric/Behavioral: Negative for agitation, behavioral problems, hallucinations and sleep disturbance. The patient is not nervous/anxious.     Health Maintenance  Topic Date Due  . TETANUS/TDAP  12/14/2022  . INFLUENZA VACCINE  Completed  . DEXA SCAN  Completed  . PNA vac Low Risk Adult  Completed    Physical Exam: Vitals:   02/02/20 1421  BP: 126/70  Pulse: 77  Temp: 97.8 F (36.6 C)  Weight: 191 lb 9.6 oz (86.9 kg)  Height: 5\' 6"  (1.676 m)   Body mass index is 30.93 kg/m. Physical Exam Vitals and nursing note reviewed.  Constitutional:      General: She is not in acute distress.    Appearance: Normal appearance. She is obese. She is not ill-appearing, toxic-appearing or diaphoretic.  HENT:     Head: Normocephalic and atraumatic.     Nose: Nose normal.     Mouth/Throat:     Mouth: Mucous membranes are moist.  Eyes:     Extraocular Movements: Extraocular movements intact.     Conjunctiva/sclera: Conjunctivae normal.     Pupils: Pupils are equal, round, and reactive to light.  Cardiovascular:     Rate and Rhythm: Normal  rate and regular rhythm.     Heart sounds: No murmur.  Pulmonary:     Breath sounds: No wheezing, rhonchi or rales.  Abdominal:     General: Bowel sounds are normal. There is no distension.     Palpations: Abdomen is soft.     Tenderness: There is no abdominal tenderness. There is no right CVA tenderness, left CVA tenderness, guarding or rebound.  Musculoskeletal:     Cervical back: Normal range of motion and neck supple.     Right lower leg: Edema present.     Left lower leg: No edema.  Skin:    General: Skin is warm and dry.  Neurological:     General:  No focal deficit present.     Mental Status: She is alert and oriented to person, place, and time. Mental status is at baseline.     Motor: No weakness.     Coordination: Coordination normal.     Gait: Gait abnormal.  Psychiatric:        Mood and Affect: Mood normal.        Behavior: Behavior normal.        Thought Content: Thought content normal.        Judgment: Judgment normal.     Labs reviewed: Basic Metabolic Panel: No results for input(s): NA, K, CL, CO2, GLUCOSE, BUN, CREATININE, CALCIUM, MG, PHOS, TSH in the last 8760 hours. Liver Function Tests: No results for input(s): AST, ALT, ALKPHOS, BILITOT, PROT, ALBUMIN in the last 8760 hours. No results for input(s): LIPASE, AMYLASE in the last 8760 hours. No results for input(s): AMMONIA in the last 8760 hours. CBC: Recent Labs    02/22/19 0745  WBC 6.9  HGB 12.6  HCT 37.0  MCV 94.1  PLT 214   Lipid Panel: Recent Labs    02/22/19 0745 05/24/19 0700  CHOL 202* 134  HDL 45* 50  LDLCALC 135* 70  TRIG 111 67  CHOLHDL 4.5 2.7   Lab Results  Component Value Date   HGBA1C 5.4 05/04/2018    Procedures since last visit: No results found.  Assessment/Plan  Advanced care planning/counseling discussion Current : DNR, comfort measures, antibiotics if indicated, IVF if indicated, feeding tube for a defined trial period 02/02/20 DNR explained, discussed,  questions answered. The patient desires to discuss the decision further with her children.   Time spend 25 minutes.   HTN (hypertension) Blood pressure is controlled, continue Losartan/HCTZ  Lumbago Managed with walker, Tylenol, Tramadol.    Labs/tests ordered:  None  Next appt:  02/16/2020

## 2020-02-03 ENCOUNTER — Encounter: Payer: Self-pay | Admitting: Nurse Practitioner

## 2020-02-03 NOTE — Assessment & Plan Note (Signed)
Blood pressure is controlled, continue Losartan/HCTZ

## 2020-02-03 NOTE — Assessment & Plan Note (Signed)
Managed with walker, Tylenol, Tramadol.

## 2020-02-16 ENCOUNTER — Encounter: Payer: Self-pay | Admitting: Nurse Practitioner

## 2020-03-01 ENCOUNTER — Other Ambulatory Visit: Payer: Self-pay

## 2020-03-01 ENCOUNTER — Encounter: Payer: Self-pay | Admitting: Nurse Practitioner

## 2020-03-01 ENCOUNTER — Non-Acute Institutional Stay: Payer: PPO | Admitting: Nurse Practitioner

## 2020-03-01 DIAGNOSIS — J309 Allergic rhinitis, unspecified: Secondary | ICD-10-CM | POA: Diagnosis not present

## 2020-03-01 DIAGNOSIS — K219 Gastro-esophageal reflux disease without esophagitis: Secondary | ICD-10-CM | POA: Diagnosis not present

## 2020-03-01 DIAGNOSIS — G8929 Other chronic pain: Secondary | ICD-10-CM | POA: Diagnosis not present

## 2020-03-01 DIAGNOSIS — F419 Anxiety disorder, unspecified: Secondary | ICD-10-CM | POA: Diagnosis not present

## 2020-03-01 DIAGNOSIS — M5440 Lumbago with sciatica, unspecified side: Secondary | ICD-10-CM | POA: Diagnosis not present

## 2020-03-01 DIAGNOSIS — E782 Mixed hyperlipidemia: Secondary | ICD-10-CM | POA: Diagnosis not present

## 2020-03-01 DIAGNOSIS — D509 Iron deficiency anemia, unspecified: Secondary | ICD-10-CM | POA: Diagnosis not present

## 2020-03-01 DIAGNOSIS — I10 Essential (primary) hypertension: Secondary | ICD-10-CM | POA: Diagnosis not present

## 2020-03-01 DIAGNOSIS — E669 Obesity, unspecified: Secondary | ICD-10-CM | POA: Diagnosis not present

## 2020-03-01 NOTE — Progress Notes (Signed)
Location:   clinic [place]   Place of Service:  Clinic (12) Provider: Marlana Latus NP  Code Status: DNR Goals of Care: [place]ives 08/25/2019  Does Patient Have a Medical Advance Directive? Yes  Type of Paramedic of [place];Living will;Out of facility DNR (pink MOST or yellow form)  Does patient want to make changes to medical advance directive? No - Patient declined  Copy of [place] in Chart? Yes - validated most recent copy scanned in chart (See row information)  Pre-existing out of facility DNR order (yellow form or pink MOST form) Yellow form placed in chart (order not valid for inpatient use);Pink MOST form placed in chart (order not valid for inpatient use)     Chief Complaint  Patient presents with  . Medical Management of Chronic Issues    Patient complains of slowing down and memory not being as sharp.    HPI: Patient is a 86 y.o. female seen today for medical management of chronic diseases.    Hx of lower back pain, chronic, taking Tylenol 622m qd, prn Tramadol. Allergic rhinitis, stable, on Fluticasone nasal spray qd, Claritin 171mqd. Anxiety, Lorazepam prn available to her. HTN, blood pressure is controlled on Losartan/HCT 100/12.79m33md. GERD, stable on Omeprazole 6m69m.    Past Medical History:  Diagnosis Date  . GERD (gastroesophageal reflux disease)   . Hx of cataract surgery    both eyes  . Hyperglycemia   . Hyperlipidemia   . Hypertension   . Lumbago 04/05/2016  . Macular degeneration    ? which eye(s)  . Obstructive sleep apnea   . Osteoporosis, senile   . Overactive bladder   . Squamous cell skin cancer 2/16   left lower leg    Past Surgical History:  Procedure Laterality Date  . ABDOMINAL HYSTERECTOMY  1960's  . APPENDECTOMY  1960's  . MOLE REMOVAL  2000   facial  . TONSILLECTOMY      Allergies  Allergen Reactions  . Adhesive [Tape]   . Ampicillin   . Nitrofuran Derivatives   .  Sulfur   . Zoloft [Sertraline Hcl] Other (See Comments)    Dizziness, nausea, faint feeling, and nightmares      Allergies as of 03/01/2020      Reactions   Adhesive [tape]    Ampicillin    Nitrofuran Derivatives    Sulfur    Zoloft [sertraline Hcl] Other (See Comments)   Dizziness, nausea, faint feeling, and nightmares        Medication List       Accurate as of March 01, 2020 11:59 PM. If you have any questions, ask your nurse or doctor.        STOP taking these medications   Systane 0.4-0.3 % Gel ophthalmic gel Generic drug: Polyethyl Glycol-Propyl Glycol Stopped by: Aliece Honold X Chadrick Sprinkle, NP     TAKE these medications   acetaminophen 325 MG tablet Commonly known as: TYLENOL Take 650 mg by mouth daily.   aspirin EC 81 MG tablet Take 1 tablet (81 mg total) by mouth daily.   atorvastatin 20 MG tablet Commonly known as: LIPITOR TAKE 1 TABLET ONCE DAILY.   Calcium 600+D3 600-800 MG-UNIT Tabs Generic drug: Calcium Carb-Cholecalciferol Take 1 tablet by mouth 2 (two) times daily.   Fish Oil 1200 MG Caps Take 1 capsule by mouth daily.   fluticasone 50 MCG/ACT nasal spray Commonly known as: FLONASE Place 1 spray into both nostrils daily.  ICaps Caps Take 1 capsule by mouth daily.   loratadine 10 MG tablet Commonly known as: CLARITIN Take 10 mg by mouth daily.   LORazepam 0.5 MG tablet Commonly known as: ATIVAN TAKE ONE TABLET ONCE DAILY AS NEEDED FOR ANXIETY.   losartan-hydrochlorothiazide 100-12.5 MG tablet Commonly known as: HYZAAR TAKE (1) TABLET DAILY FOR HIGH BLOOD PRESSURE.   omeprazole 40 MG capsule Commonly known as: PRILOSEC TAKE 1 CAPSULE DAILY 1/2 HOUR BEFORE BREAKFAST FOR STOMACH ACID.   traMADol 50 MG tablet Commonly known as: ULTRAM TAKE (1) TABLET EVERY SIX HOURS AS NEEDED FOR SEVERE PAIN.    - MAY MAKE DROWSY -       Review of Systems:  Review of Systems  Constitutional: Positive for unexpected weight change. Negative for activity change,  appetite change, chills, diaphoresis, fatigue and fever.       Weight gained.   HENT: Positive for hearing loss. Negative for congestion.   Eyes: Negative for visual disturbance.  Respiratory: Negative for cough, shortness of breath and wheezing.   Cardiovascular: Negative for leg swelling.  Gastrointestinal: Negative for abdominal distention, abdominal pain, constipation, nausea and vomiting.  Genitourinary: Negative for difficulty urinating, dysuria and urgency.       0-1x/night urination  Musculoskeletal: Positive for arthralgias, back pain and gait problem.  Skin: Negative for color change and pallor.  Neurological: Negative for dizziness, speech difficulty, weakness and headaches.       Tingling in toes sometimes, better after moves around.   Psychiatric/Behavioral: Negative for agitation, behavioral problems, hallucinations and sleep disturbance. The patient is not nervous/anxious.     Health Maintenance  Topic Date Due  . TETANUS/TDAP  12/14/2022  . INFLUENZA VACCINE  Completed  . DEXA SCAN  Completed  . PNA vac Low Risk Adult  Completed    Physical Exam: Vitals:   03/01/20 1534  BP: 124/70  Pulse: 65  Temp: (!) 96.9 F (36.1 C)  SpO2: 93%  Weight: 198 lb 6.4 oz (90 kg)  Height: '5\' 6"'  (1.676 m)   Body mass index is 32.02 kg/m. Physical Exam Vitals and nursing note reviewed.  Constitutional:      General: She is not in acute distress.    Appearance: Normal appearance. She is obese. She is not ill-appearing.  HENT:     Head: Normocephalic and atraumatic.     Nose: Nose normal.     Mouth/Throat:     Mouth: Mucous membranes are moist.  Eyes:     Extraocular Movements: Extraocular movements intact.     Conjunctiva/sclera: Conjunctivae normal.     Pupils: Pupils are equal, round, and reactive to light.  Cardiovascular:     Rate and Rhythm: Normal rate and regular rhythm.     Heart sounds: No murmur.  Pulmonary:     Breath sounds: No wheezing or rales.    Abdominal:     General: Bowel sounds are normal. There is no distension.     Palpations: Abdomen is soft.     Tenderness: There is no abdominal tenderness. There is no guarding or rebound.  Musculoskeletal:     Cervical back: Normal range of motion and neck supple.     Right lower leg: No edema.  Skin:    General: Skin is warm and dry.  Neurological:     General: No focal deficit present.     Mental Status: She is alert and oriented to person, place, and time. Mental status is at baseline.     Motor:  No weakness.     Coordination: Coordination normal.     Gait: Gait abnormal.  Psychiatric:        Mood and Affect: Mood normal.        Behavior: Behavior normal.        Thought Content: Thought content normal.        Judgment: Judgment normal.     Labs reviewed: Basic Metabolic Panel: No results for input(s): NA, K, CL, CO2, GLUCOSE, BUN, CREATININE, CALCIUM, MG, PHOS, TSH in the last 8760 hours. Liver Function Tests: No results for input(s): AST, ALT, ALKPHOS, BILITOT, PROT, ALBUMIN in the last 8760 hours. No results for input(s): LIPASE, AMYLASE in the last 8760 hours. No results for input(s): AMMONIA in the last 8760 hours. CBC: No results for input(s): WBC, NEUTROABS, HGB, HCT, MCV, PLT in the last 8760 hours. Lipid Panel: Recent Labs    05/24/19 0700  CHOL 134  HDL 50  LDLCALC 70  TRIG 67  CHOLHDL 2.7   Lab Results  Component Value Date   HGBA1C 5.4 05/04/2018    Procedures since last visit: No results found.  Assessment/Plan  HTN (hypertension) Blood pressure is controlled, continue Losartan/HCT, update CBC/diff, CMP/eGFR, TSH, lipid panel.   Allergic rhinitis Stable, continue Fluticasone nasal spray, Claritin.   GERD (gastroesophageal reflux disease) Stable, continue Omeprazole.   Anxiety Stable, continue prn Lorazepam. Update TSH  Hyperlipidemia LDL 70 05/23/19, repeat lipid panel.   Lumbago Continue Tylenol, chronic, not disabling.   Iron  deficiency anemia Stable, update CBC/diff.   Obesity (BMI 30-39.9) Update Hgb a1c. Continue gradual weight gain, eat and sleep at her baseline, denied fatigue, respiratory or cardiac or GI symptoms. Will update CBC/diff, CMP/eGFR, Lipid panel, TSH as well.    Labs/tests ordered:  CBC/diff, CMP/eGFR, Lipid panel, TSH, Hgb a1c.  Next appt:  F/u with Dr. Lyndel Safe in 3 months.

## 2020-03-01 NOTE — Assessment & Plan Note (Addendum)
Stable, continue prn Lorazepam. Update TSH

## 2020-03-01 NOTE — Patient Instructions (Signed)
F/u with Dr Gupta in 3 months

## 2020-03-01 NOTE — Assessment & Plan Note (Addendum)
Update Hgb a1c. Continue gradual weight gain, eat and sleep at her baseline, denied fatigue, respiratory or cardiac or GI symptoms. Will update CBC/diff, CMP/eGFR, Lipid panel, TSH as well.  

## 2020-03-01 NOTE — Assessment & Plan Note (Signed)
Blood pressure is controlled, continue Losartan/HCT, update CBC/diff, CMP/eGFR, TSH, lipid panel.

## 2020-03-01 NOTE — Assessment & Plan Note (Signed)
LDL 70 05/23/19, repeat lipid panel.

## 2020-03-01 NOTE — Assessment & Plan Note (Signed)
Stable, continue Omeprazole.  

## 2020-03-01 NOTE — Assessment & Plan Note (Signed)
Stable, continue Fluticasone nasal spray, Claritin.

## 2020-03-01 NOTE — Assessment & Plan Note (Signed)
Continue Tylenol, chronic, not disabling.

## 2020-03-01 NOTE — Assessment & Plan Note (Signed)
Stable, update CBC/diff.

## 2020-03-02 ENCOUNTER — Encounter: Payer: Self-pay | Admitting: Nurse Practitioner

## 2020-03-27 ENCOUNTER — Other Ambulatory Visit: Payer: Self-pay | Admitting: Nurse Practitioner

## 2020-03-29 DIAGNOSIS — R29898 Other symptoms and signs involving the musculoskeletal system: Secondary | ICD-10-CM | POA: Diagnosis not present

## 2020-03-29 DIAGNOSIS — M6281 Muscle weakness (generalized): Secondary | ICD-10-CM | POA: Diagnosis not present

## 2020-03-29 DIAGNOSIS — R2681 Unsteadiness on feet: Secondary | ICD-10-CM | POA: Diagnosis not present

## 2020-03-29 DIAGNOSIS — N3281 Overactive bladder: Secondary | ICD-10-CM | POA: Diagnosis not present

## 2020-03-29 DIAGNOSIS — D049 Carcinoma in situ of skin, unspecified: Secondary | ICD-10-CM | POA: Diagnosis not present

## 2020-03-29 DIAGNOSIS — M79604 Pain in right leg: Secondary | ICD-10-CM | POA: Diagnosis not present

## 2020-04-02 DIAGNOSIS — G4733 Obstructive sleep apnea (adult) (pediatric): Secondary | ICD-10-CM | POA: Diagnosis not present

## 2020-04-07 ENCOUNTER — Other Ambulatory Visit: Payer: Self-pay | Admitting: Internal Medicine

## 2020-04-13 ENCOUNTER — Other Ambulatory Visit: Payer: Self-pay | Admitting: Internal Medicine

## 2020-05-01 DIAGNOSIS — N3281 Overactive bladder: Secondary | ICD-10-CM | POA: Diagnosis not present

## 2020-05-01 DIAGNOSIS — M6281 Muscle weakness (generalized): Secondary | ICD-10-CM | POA: Diagnosis not present

## 2020-05-01 DIAGNOSIS — R2681 Unsteadiness on feet: Secondary | ICD-10-CM | POA: Diagnosis not present

## 2020-05-01 DIAGNOSIS — R29898 Other symptoms and signs involving the musculoskeletal system: Secondary | ICD-10-CM | POA: Diagnosis not present

## 2020-05-01 DIAGNOSIS — M79604 Pain in right leg: Secondary | ICD-10-CM | POA: Diagnosis not present

## 2020-05-01 DIAGNOSIS — M545 Low back pain: Secondary | ICD-10-CM | POA: Diagnosis not present

## 2020-05-01 DIAGNOSIS — D049 Carcinoma in situ of skin, unspecified: Secondary | ICD-10-CM | POA: Diagnosis not present

## 2020-05-10 ENCOUNTER — Other Ambulatory Visit: Payer: Self-pay | Admitting: Nurse Practitioner

## 2020-05-16 DIAGNOSIS — H47393 Other disorders of optic disc, bilateral: Secondary | ICD-10-CM | POA: Diagnosis not present

## 2020-05-16 DIAGNOSIS — H04123 Dry eye syndrome of bilateral lacrimal glands: Secondary | ICD-10-CM | POA: Diagnosis not present

## 2020-06-01 ENCOUNTER — Encounter: Payer: Self-pay | Admitting: Internal Medicine

## 2020-06-14 ENCOUNTER — Encounter: Payer: Self-pay | Admitting: Nurse Practitioner

## 2020-06-14 ENCOUNTER — Non-Acute Institutional Stay: Payer: PPO | Admitting: Nurse Practitioner

## 2020-06-14 ENCOUNTER — Other Ambulatory Visit: Payer: Self-pay

## 2020-06-14 DIAGNOSIS — M5412 Radiculopathy, cervical region: Secondary | ICD-10-CM | POA: Diagnosis not present

## 2020-06-14 DIAGNOSIS — F419 Anxiety disorder, unspecified: Secondary | ICD-10-CM | POA: Diagnosis not present

## 2020-06-14 DIAGNOSIS — N3281 Overactive bladder: Secondary | ICD-10-CM

## 2020-06-14 DIAGNOSIS — K219 Gastro-esophageal reflux disease without esophagitis: Secondary | ICD-10-CM

## 2020-06-14 DIAGNOSIS — R232 Flushing: Secondary | ICD-10-CM

## 2020-06-14 DIAGNOSIS — I10 Essential (primary) hypertension: Secondary | ICD-10-CM

## 2020-06-14 DIAGNOSIS — J309 Allergic rhinitis, unspecified: Secondary | ICD-10-CM | POA: Diagnosis not present

## 2020-06-14 NOTE — Progress Notes (Signed)
Location:   clinic [place]   Place of Service:  Clinic (12) Provider: Marlana Latus NP  Code Status: DNR Goals of Care:  Advanced Directives 08/25/2019  Does Patient Have a Medical Advance Directive? Yes  Type of Paramedic of [place];Living will;Out of facility DNR (pink MOST or yellow form)  Does patient want to make changes to medical advance directive? No - Patient declined  Copy of [place] in Chart? Yes - validated most recent copy scanned in chart (See row information)  Pre-existing out of facility DNR order (yellow form or pink MOST form) Yellow form placed in chart (order not valid for inpatient use);Pink MOST form placed in chart (order not valid for inpatient use)     Chief Complaint  Patient presents with   Medical Management of Chronic Issues    Patient returns to the clinic for follow up. She complains of having hot flashes and a spot on her right hand.     HPI: Patient is a 86 y.o. female seen today for medical management of chronic diseases.      Hx of lower back pain, chronic, taking Tylenol 683m qd, prn Tramadol. Allergic rhinitis, stable, on Fluticasone nasal spray qd, Claritin 123mqd. Anxiety, Lorazepam prn available to her. HTN, blood pressure is controlled on Losartan/HCT 100/12.57m24md, ASA, Atorvastatin.  GERD, stable on Omeprazole 73m37m.    Past Medical History:  Diagnosis Date   GERD (gastroesophageal reflux disease)    Hx of cataract surgery    both eyes   Hyperglycemia    Hyperlipidemia    Hypertension    Lumbago 04/05/2016   Macular degeneration    ? which eye(s)   Obstructive sleep apnea    Osteoporosis, senile    Overactive bladder    Squamous cell skin cancer 2/16   left lower leg    Past Surgical History:  Procedure Laterality Date   ABDOMINAL HYSTERECTOMY  1960's   APPENDECTOMY  1960's   MOLE REMOVAL  2000   facial   TONSILLECTOMY      Allergies  Allergen Reactions    Adhesive [Tape]    Ampicillin    Nitrofuran Derivatives    Sulfur    Zoloft [Sertraline Hcl] Other (See Comments)    Dizziness, nausea, faint feeling, and nightmares      Allergies as of 06/14/2020      Reactions   Adhesive [tape]    Ampicillin    Nitrofuran Derivatives    Sulfur    Zoloft [sertraline Hcl] Other (See Comments)   Dizziness, nausea, faint feeling, and nightmares        Medication List       Accurate as of June 14, 2020  5:06 PM. If you have any questions, ask your nurse or doctor.        acetaminophen 325 MG tablet Commonly known as: TYLENOL Take 650 mg by mouth daily.   aspirin EC 81 MG tablet Take 1 tablet (81 mg total) by mouth daily.   atorvastatin 20 MG tablet Commonly known as: LIPITOR TAKE 1 TABLET ONCE DAILY.   Calcium 600+D3 600-800 MG-UNIT Tabs Generic drug: Calcium Carb-Cholecalciferol Take 1 tablet by mouth 2 (two) times daily.   Fish Oil 1200 MG Caps Take 1 capsule by mouth daily.   fluticasone 50 MCG/ACT nasal spray Commonly known as: FLONASE Place 1 spray into both nostrils daily.   ICaps Caps Take 1 capsule by mouth daily.   loratadine 10 MG tablet  Commonly known as: CLARITIN Take 10 mg by mouth daily.   LORazepam 0.5 MG tablet Commonly known as: ATIVAN TAKE ONE TABLET ONCE DAILY AS NEEDED FOR ANXIETY.   losartan-hydrochlorothiazide 100-12.5 MG tablet Commonly known as: HYZAAR TAKE (1) TABLET DAILY FOR HIGH BLOOD PRESSURE.   omeprazole 40 MG capsule Commonly known as: PRILOSEC TAKE 1 CAPSULE DAILY 1/2 HOUR BEFORE BREAKFAST FOR STOMACH ACID.   Systane 0.4-0.3 % Gel ophthalmic gel Generic drug: Polyethyl Glycol-Propyl Glycol Place 1 application into both eyes daily.   SYSTANE ULTRA OP Apply to eye in the morning, at noon, and at bedtime.   traMADol 50 MG tablet Commonly known as: ULTRAM TAKE (1) TABLET EVERY SIX HOURS AS NEEDED FOR SEVERE PAIN.    - MAY MAKE DROWSY -       Review of Systems:  Review of  Systems  Constitutional: Negative for activity change, appetite change, fatigue, fever and unexpected weight change.       Lost #16Ibs in 3 months, stopped eating sweats.   HENT: Positive for hearing loss. Negative for congestion.   Eyes: Negative for visual disturbance.  Respiratory: Negative for cough, shortness of breath and wheezing.   Cardiovascular: Negative for leg swelling.  Gastrointestinal: Negative for abdominal pain and constipation.  Genitourinary: Negative for difficulty urinating, dysuria and urgency.       0-1x/night urination  Musculoskeletal: Positive for arthralgias, back pain and gait problem.  Skin: Negative for color change.  Neurological: Negative for speech difficulty, weakness and light-headedness.       Tingling in toes sometimes, better after moves around.   Psychiatric/Behavioral: Negative for behavioral problems, confusion and sleep disturbance. The patient is not nervous/anxious.     Health Maintenance  Topic Date Due   INFLUENZA VACCINE  07/29/2020   TETANUS/TDAP  12/14/2022   DEXA SCAN  Completed   COVID-19 Vaccine  Completed   PNA vac Low Risk Adult  Completed    Physical Exam: Vitals:   06/14/20 1340  BP: 138/64  Pulse: 84  Temp: (!) 97.1 F (36.2 C)  SpO2: 95%  Weight: 182 lb 9.6 oz (82.8 kg)  Height: _0  (1.676 m)   Body mass index is 29.47 kg/m. Physical Exam Vitals and nursing note reviewed.  Constitutional:      Appearance: Normal appearance. She is obese.  HENT:     Head: Normocephalic and atraumatic.     Nose: Nose normal.     Mouth/Throat:     Mouth: Mucous membranes are moist.  Eyes:     Extraocular Movements: Extraocular movements intact.     Conjunctiva/sclera: Conjunctivae normal.     Pupils: Pupils are equal, round, and reactive to light.  Cardiovascular:     Rate and Rhythm: Normal rate and regular rhythm.     Heart sounds: No murmur heard.   Pulmonary:     Breath sounds: No wheezing or rales.  Abdominal:      General: Bowel sounds are normal. There is no distension.     Palpations: Abdomen is soft.     Tenderness: There is no abdominal tenderness. There is no guarding or rebound.  Musculoskeletal:     Cervical back: Normal range of motion and neck supple.     Right lower leg: No edema.     Left lower leg: No edema.  Skin:    General: Skin is warm and dry.  Neurological:     General: No focal deficit present.     Mental Status: She is  alert and oriented to person, place, and time. Mental status is at baseline.     Motor: No weakness.     Coordination: Coordination normal.     Gait: Gait abnormal.  Psychiatric:        Mood and Affect: Mood normal.        Behavior: Behavior normal.        Thought Content: Thought content normal.        Judgment: Judgment normal.     Labs reviewed: Basic Metabolic Panel: No results for input(s): NA, K, CL, CO2, GLUCOSE, BUN, CREATININE, CALCIUM, MG, PHOS, TSH in the last 8760 hours. Liver Function Tests: No results for input(s): AST, ALT, ALKPHOS, BILITOT, PROT, ALBUMIN in the last 8760 hours. No results for input(s): LIPASE, AMYLASE in the last 8760 hours. No results for input(s): AMMONIA in the last 8760 hours. CBC: No results for input(s): WBC, NEUTROABS, HGB, HCT, MCV, PLT in the last 8760 hours. Lipid Panel: No results for input(s): CHOL, HDL, LDLCALC, TRIG, CHOLHDL, LDLDIRECT in the last 8760 hours. Lab Results  Component Value Date   HGBA1C 5.4 05/04/2018    Procedures since last visit: No results found.  Assessment/Plan  HTN (hypertension) Blood pressure is controlled, continue Losartan/HCTZ, update CBC/diff, CMP/eGFR, TSH, lipid panel, continue ASA, Atorvastatin.   Allergic rhinitis Stable, continue Flonase, Claritin.   GERD (gastroesophageal reflux disease) Stable, continue Omeprazole.   OAB (overactive bladder) Chronic issue  Anxiety Remains no change, continue Lorazepam  Cervical neuropathic pain Chronic, multiple  sites, continue Tylenol.  Hot flashes Recurrent, Tramadol may contribute to it in the past, last used was about a month ago. B vs R of treatment-the patient will think about it.    Labs/tests ordered:  CBC/diff, CMP/eGFR, Lipid panel, TSH prior to the next appointment.   Next appt:  3 months with Dr. Lyndel Safe

## 2020-06-14 NOTE — Assessment & Plan Note (Signed)
Stable, continue Flonase, Claritin.  

## 2020-06-14 NOTE — Assessment & Plan Note (Signed)
Remains no change, continue Lorazepam

## 2020-06-14 NOTE — Assessment & Plan Note (Signed)
Chronic issue.  

## 2020-06-14 NOTE — Assessment & Plan Note (Addendum)
Recurrent, Tramadol may contribute to it in the past, last used was about a month ago. B vs R of treatment-the patient will think about it.

## 2020-06-14 NOTE — Assessment & Plan Note (Signed)
Stable, continue Omeprazole.  

## 2020-06-14 NOTE — Patient Instructions (Signed)
Will call you with your next visit and lab appointment.

## 2020-06-14 NOTE — Assessment & Plan Note (Signed)
Chronic, multiple sites, continue Tylenol.

## 2020-06-14 NOTE — Assessment & Plan Note (Signed)
Blood pressure is controlled, continue Losartan/HCTZ, update CBC/diff, CMP/eGFR, TSH, lipid panel, continue ASA, Atorvastatin.

## 2020-06-26 DIAGNOSIS — G4733 Obstructive sleep apnea (adult) (pediatric): Secondary | ICD-10-CM | POA: Diagnosis not present

## 2020-06-27 DIAGNOSIS — N8110 Cystocele, unspecified: Secondary | ICD-10-CM | POA: Diagnosis not present

## 2020-06-27 DIAGNOSIS — N3281 Overactive bladder: Secondary | ICD-10-CM | POA: Diagnosis not present

## 2020-07-03 ENCOUNTER — Other Ambulatory Visit: Payer: Self-pay | Admitting: Nurse Practitioner

## 2020-07-13 ENCOUNTER — Other Ambulatory Visit: Payer: Self-pay | Admitting: Internal Medicine

## 2020-08-09 ENCOUNTER — Other Ambulatory Visit: Payer: Self-pay | Admitting: Nurse Practitioner

## 2020-08-22 ENCOUNTER — Encounter: Payer: Self-pay | Admitting: Internal Medicine

## 2020-09-06 DIAGNOSIS — D225 Melanocytic nevi of trunk: Secondary | ICD-10-CM | POA: Diagnosis not present

## 2020-09-06 DIAGNOSIS — D485 Neoplasm of uncertain behavior of skin: Secondary | ICD-10-CM | POA: Diagnosis not present

## 2020-09-06 DIAGNOSIS — L814 Other melanin hyperpigmentation: Secondary | ICD-10-CM | POA: Diagnosis not present

## 2020-09-06 DIAGNOSIS — L821 Other seborrheic keratosis: Secondary | ICD-10-CM | POA: Diagnosis not present

## 2020-09-14 DIAGNOSIS — G4733 Obstructive sleep apnea (adult) (pediatric): Secondary | ICD-10-CM | POA: Diagnosis not present

## 2020-09-24 DIAGNOSIS — E782 Mixed hyperlipidemia: Secondary | ICD-10-CM | POA: Diagnosis not present

## 2020-09-24 DIAGNOSIS — E669 Obesity, unspecified: Secondary | ICD-10-CM | POA: Diagnosis not present

## 2020-09-24 DIAGNOSIS — I10 Essential (primary) hypertension: Secondary | ICD-10-CM | POA: Diagnosis not present

## 2020-09-24 DIAGNOSIS — K219 Gastro-esophageal reflux disease without esophagitis: Secondary | ICD-10-CM | POA: Diagnosis not present

## 2020-09-25 ENCOUNTER — Other Ambulatory Visit: Payer: Self-pay

## 2020-09-25 DIAGNOSIS — E782 Mixed hyperlipidemia: Secondary | ICD-10-CM

## 2020-09-25 DIAGNOSIS — I10 Essential (primary) hypertension: Secondary | ICD-10-CM

## 2020-09-25 DIAGNOSIS — K219 Gastro-esophageal reflux disease without esophagitis: Secondary | ICD-10-CM

## 2020-09-25 DIAGNOSIS — E669 Obesity, unspecified: Secondary | ICD-10-CM

## 2020-09-26 LAB — CBC WITH DIFFERENTIAL/PLATELET
Absolute Monocytes: 524 cells/uL (ref 200–950)
Basophils Absolute: 61 cells/uL (ref 0–200)
Basophils Relative: 0.9 %
Eosinophils Absolute: 150 cells/uL (ref 15–500)
Eosinophils Relative: 2.2 %
HCT: 38.1 % (ref 35.0–45.0)
Hemoglobin: 12.7 g/dL (ref 11.7–15.5)
Lymphs Abs: 2271 cells/uL (ref 850–3900)
MCH: 31.7 pg (ref 27.0–33.0)
MCHC: 33.3 g/dL (ref 32.0–36.0)
MCV: 95 fL (ref 80.0–100.0)
MPV: 9.9 fL (ref 7.5–12.5)
Monocytes Relative: 7.7 %
Neutro Abs: 3794 cells/uL (ref 1500–7800)
Neutrophils Relative %: 55.8 %
Platelets: 188 10*3/uL (ref 140–400)
RBC: 4.01 10*6/uL (ref 3.80–5.10)
RDW: 13.1 % (ref 11.0–15.0)
Total Lymphocyte: 33.4 %
WBC: 6.8 10*3/uL (ref 3.8–10.8)

## 2020-09-26 LAB — LIPID PANEL
Cholesterol: 135 mg/dL (ref <200)
HDL: 54 mg/dL (ref >=50)
LDL Cholesterol (Calc): 67 mg/dL (calc)
Non-HDL Cholesterol (Calc): 81 mg/dL (calc) (ref <130)
Total CHOL/HDL Ratio: 2.5 (calc) (ref <5.0)
Triglycerides: 63 mg/dL (ref <150)

## 2020-09-26 LAB — COMPLETE METABOLIC PANEL WITH GFR
AG Ratio: 2 (calc) (ref 1.0–2.5)
ALT: 14 U/L (ref 6–29)
AST: 22 U/L (ref 10–35)
Albumin: 4 g/dL (ref 3.6–5.1)
Alkaline phosphatase (APISO): 71 U/L (ref 37–153)
BUN/Creatinine Ratio: 27 (calc) — ABNORMAL HIGH (ref 6–22)
BUN: 25 mg/dL (ref 7–25)
CO2: 28 mmol/L (ref 20–32)
Calcium: 9.6 mg/dL (ref 8.6–10.4)
Chloride: 105 mmol/L (ref 98–110)
Creat: 0.93 mg/dL — ABNORMAL HIGH (ref 0.60–0.88)
GFR, Est African American: 61 mL/min/{1.73_m2} (ref >=60)
GFR, Est Non African American: 53 mL/min/{1.73_m2} — ABNORMAL LOW (ref >=60)
Globulin: 2 g/dL (calc) (ref 1.9–3.7)
Glucose, Bld: 147 mg/dL — ABNORMAL HIGH (ref 65–99)
Potassium: 3.9 mmol/L (ref 3.5–5.3)
Sodium: 141 mmol/L (ref 135–146)
Total Bilirubin: 0.6 mg/dL (ref 0.2–1.2)
Total Protein: 6 g/dL — ABNORMAL LOW (ref 6.1–8.1)

## 2020-09-26 LAB — HEMOGLOBIN A1C
Hgb A1c MFr Bld: 5.7 % of total Hgb — ABNORMAL HIGH (ref <5.7)
Mean Plasma Glucose: 117 (calc)
eAG (mmol/L): 6.5 (calc)

## 2020-09-28 ENCOUNTER — Encounter: Payer: Self-pay | Admitting: Internal Medicine

## 2020-09-28 ENCOUNTER — Other Ambulatory Visit: Payer: Self-pay

## 2020-09-28 ENCOUNTER — Non-Acute Institutional Stay: Payer: PPO | Admitting: Internal Medicine

## 2020-09-28 VITALS — BP 156/88 | HR 82 | Temp 97.7°F | Ht 66.0 in | Wt 187.4 lb

## 2020-09-28 DIAGNOSIS — E782 Mixed hyperlipidemia: Secondary | ICD-10-CM

## 2020-09-28 DIAGNOSIS — I10 Essential (primary) hypertension: Secondary | ICD-10-CM

## 2020-09-28 DIAGNOSIS — M5440 Lumbago with sciatica, unspecified side: Secondary | ICD-10-CM | POA: Diagnosis not present

## 2020-09-28 DIAGNOSIS — R232 Flushing: Secondary | ICD-10-CM

## 2020-09-28 DIAGNOSIS — K219 Gastro-esophageal reflux disease without esophagitis: Secondary | ICD-10-CM | POA: Diagnosis not present

## 2020-09-28 DIAGNOSIS — F419 Anxiety disorder, unspecified: Secondary | ICD-10-CM

## 2020-09-28 DIAGNOSIS — G8929 Other chronic pain: Secondary | ICD-10-CM | POA: Diagnosis not present

## 2020-09-28 NOTE — Progress Notes (Signed)
Location:  [place] of Service:  Clinic (12)  Provider:   Code Status:  Goals of Care:  Advanced Directives 08/25/2019  Does Patient Have a Medical Advance Directive? Yes  Type of Paramedic of [place];Living will;Out of facility DNR (pink MOST or yellow form)  Does patient want to make changes to medical advance directive? No - Patient declined  Copy of [place] in Chart? Yes - validated most recent copy scanned in chart (See row information)  Pre-existing out of facility DNR order (yellow form or pink MOST form) Yellow form placed in chart (order not valid for inpatient use);Pink MOST form placed in chart (order not valid for inpatient use)     Chief Complaint  Patient presents with  . Medical Management of Chronic Issues    Patient returns to the clinic for follow up.     HPI: Patient is a 86 y.o. female seen today for medical management of chronic diseases.    Patient has h/o hypertension, Hyperlipidemia,Anxiety,Anemia,GERD, Arthritis  Anxiety Says she uses Ativan once in few weeks when she get very anxious and not able to Function. She wants to know if she can get refill Vaccination Wants to discuss if she can get Flu shot and covid shot here in facility Memory issues Sometimes forgets few names and events. But very highly functional. Staying independent in her ADLS and IADLS.  Walks with the walker. Has some chronic issues with Hot flashes Trying to loose some weight by avoiding Sugars She lives by herself in [place].  Has a son who lives an hour away.   Past Medical History:  Diagnosis Date  . GERD (gastroesophageal reflux disease)   . Hx of cataract surgery    both eyes  . Hyperglycemia   . Hyperlipidemia   . Hypertension   . Lumbago 04/05/2016  . Macular degeneration    ? which eye(s)  . Obstructive sleep apnea   . Osteoporosis, senile   . Overactive bladder   . Squamous cell skin cancer 2/16     left lower leg    Past Surgical History:  Procedure Laterality Date  . ABDOMINAL HYSTERECTOMY  1960's  . APPENDECTOMY  1960's  . MOLE REMOVAL  2000   facial  . TONSILLECTOMY      Allergies  Allergen Reactions  . Adhesive [Tape]   . Ampicillin   . Nitrofuran Derivatives   . Sulfur   . Zoloft [Sertraline Hcl] Other (See Comments)    Dizziness, nausea, faint feeling, and nightmares      Outpatient Encounter Medications as of 09/28/2020  Medication Sig  . acetaminophen (TYLENOL) 500 MG tablet Take 500 mg by mouth daily.   Marland Kitchen aspirin EC 81 MG tablet Take 1 tablet (81 mg total) by mouth daily.  Marland Kitchen atorvastatin (LIPITOR) 20 MG tablet TAKE 1 TABLET ONCE DAILY.  . Calcium Carb-Cholecalciferol (CALCIUM 600+D3) 600-800 MG-UNIT TABS Take 1 tablet by mouth 2 (two) times daily.   . fluticasone (FLONASE) 50 MCG/ACT nasal spray Place 1 spray into both nostrils daily.  Marland Kitchen loratadine (CLARITIN) 10 MG tablet Take 10 mg by mouth daily.  Marland Kitchen LORazepam (ATIVAN) 0.5 MG tablet TAKE ONE TABLET ONCE DAILY AS NEEDED FOR ANXIETY.  Marland Kitchen losartan-hydrochlorothiazide (HYZAAR) 100-12.5 MG tablet TAKE (1) TABLET DAILY FOR HIGH BLOOD PRESSURE.  . Multiple Vitamins-Minerals (ICAPS) CAPS Take 1 capsule by mouth daily.   . Omega-3 Fatty Acids (FISH OIL) 1200 MG CAPS Take 1 capsule  by mouth daily.   Marland Kitchen omeprazole (PRILOSEC) 40 MG capsule TAKE 1 CAPSULE DAILY 1/2 HOUR BEFORE BREAKFAST FOR STOMACH ACID.  Marland Kitchen Polyethyl Glycol-Propyl Glycol (SYSTANE ULTRA OP) Apply to eye in the morning, at noon, and at bedtime.  Vladimir Faster Glycol-Propyl Glycol (SYSTANE) 0.4-0.3 % GEL ophthalmic gel Place 1 application into both eyes daily.  . traMADol (ULTRAM) 50 MG tablet TAKE (1) TABLET EVERY SIX HOURS AS NEEDED FOR SEVERE PAIN.    - MAY MAKE DROWSY -   No facility-administered encounter medications on file as of 09/28/2020.    Review of Systems:  Review of Systems Review of Systems  Constitutional: Negative for activity change,  appetite change, chills, diaphoresis, fatigue and fever.  HENT: Negative for mouth sores, postnasal drip, rhinorrhea, sinus pain and sore throat.   Respiratory: Negative for apnea, cough, chest tightness, shortness of breath and wheezing.   Cardiovascular: Negative for chest pain, palpitations and leg swelling.  Gastrointestinal: Negative for abdominal distention, abdominal pain, constipation, diarrhea, nausea and vomiting.  Genitourinary: Negative for dysuria and frequency.  Musculoskeletal: Negative for arthralgias, joint swelling and myalgias.  Skin: Negative for rash.  Neurological: Negative for dizziness, syncope, weakness, light-headedness and numbness.  Psychiatric/Behavioral: Negative for behavioral problems, confusion and sleep disturbance.    Health Maintenance  Topic Date Due  . INFLUENZA VACCINE  07/29/2020  . TETANUS/TDAP  12/14/2022  . DEXA SCAN  Completed  . COVID-19 Vaccine  Completed  . PNA vac Low Risk Adult  Completed    Physical Exam: Vitals:   09/28/20 0925  BP: (!) 156/88  Pulse: 82  Temp: 97.7 F (36.5 C)  SpO2: 96%  Weight: 187 lb 6.4 oz (85 kg)  Height: 5\' 6"  (1.676 m)   Body mass index is 30.25 kg/m. Physical Exam  Constitutional: Oriented to person, place, and time. Well-developed and well-nourished.  HENT:  Head: Normocephalic.  Mouth/Throat: Oropharynx is clear and moist.  Eyes: Pupils are equal, round, and reactive to light.  Neck: Neck supple.  Cardiovascular: Normal rate and normal heart sounds.  No murmur heard. Pulmonary/Chest: Effort normal and breath sounds normal. No respiratory distress. No wheezes. She has no rales.  Abdominal: Soft. Bowel sounds are normal. No distension. There is no tenderness. There is no rebound.  Musculoskeletal: Mild edema.  Lymphadenopathy: none Neurological: Alert and oriented to person, place, and time. Walks with the walker Skin: Skin is warm and dry.  Psychiatric: Normal mood and affect. Behavior is  normal. Thought content normal.    Labs reviewed: Basic Metabolic Panel: Recent Labs    09/24/20 0858  NA 141  K 3.9  CL 105  CO2 28  GLUCOSE 147*  BUN 25  CREATININE 0.93*  CALCIUM 9.6   Liver Function Tests: Recent Labs    09/24/20 0858  AST 22  ALT 14  BILITOT 0.6  PROT 6.0*   No results for input(s): LIPASE, AMYLASE in the last 8760 hours. No results for input(s): AMMONIA in the last 8760 hours. CBC: Recent Labs    09/24/20 0858  WBC 6.8  NEUTROABS 3,794  HGB 12.7  HCT 38.1  MCV 95.0  PLT 188   Lipid Panel: Recent Labs    09/24/20 0858  CHOL 135  HDL 54  LDLCALC 67  TRIG 63  CHOLHDL 2.5   Lab Results  Component Value Date   HGBA1C 5.7 (H) 09/24/2020    Procedures since last visit: No results found.  Assessment/Plan  Essential hypertension Mildily high but continue same  meds for now  Gastroesophageal reflux disease without esophagitis On Omeprazole Supportive care TSH has been normal Will need Follow with Next labs draw  Mixed hyperlipidemia  LDL 67 Continue Statin Anxiety Ativan renewed Risk Reviewed with her  Chronic bilateral low back pain with sciatica Tramadol PRN  Cognitive Impairment Reassured that it is not Dementia Her Last MMSE 29/30 Overweight (BMI 25.0-29.9) Has lost some weight She is cutting back on her Sugar intake Discussed about walking more   Will get her 2nd shingles after getting Flu and Covid shot in facility  Labs/tests ordered:  * No order type specified * Next appt:  Visit date not found

## 2020-09-29 MED ORDER — LORAZEPAM 0.5 MG PO TABS: 0.5000 mg | ORAL_TABLET | Freq: Every day | ORAL | 0 refills | Status: AC | PRN

## 2020-10-04 ENCOUNTER — Other Ambulatory Visit: Payer: Self-pay | Admitting: Nurse Practitioner

## 2020-10-09 ENCOUNTER — Other Ambulatory Visit: Payer: Self-pay | Admitting: Internal Medicine

## 2020-10-19 ENCOUNTER — Other Ambulatory Visit: Payer: Self-pay | Admitting: Nurse Practitioner

## 2020-10-19 DIAGNOSIS — R0609 Other forms of dyspnea: Secondary | ICD-10-CM

## 2020-10-19 DIAGNOSIS — R0600 Dyspnea, unspecified: Secondary | ICD-10-CM

## 2020-11-09 ENCOUNTER — Other Ambulatory Visit: Payer: Self-pay | Admitting: Nurse Practitioner

## 2020-12-24 DIAGNOSIS — G4733 Obstructive sleep apnea (adult) (pediatric): Secondary | ICD-10-CM | POA: Diagnosis not present

## 2020-12-28 ENCOUNTER — Telehealth: Payer: Self-pay | Admitting: Family

## 2020-12-28 NOTE — Telephone Encounter (Signed)
Patient called states went to a small gathering wedding on Sunday 12/23/2020 everyone was screened for COVID-19. has been informed that two people tested positive for COVID-19 and another two additional has tested positive.States Nurse at [facility] independent would like Dr.Gupta or Mast NP to give order for her to be tested.Patient advised to ask Nurse to call on call provider.Patient has completed her COVID-19 vaccine including her booster.currently Asymptomatic.

## 2020-12-31 ENCOUNTER — Other Ambulatory Visit: Payer: Self-pay | Admitting: Nurse Practitioner

## 2021-01-06 ENCOUNTER — Other Ambulatory Visit: Payer: Self-pay | Admitting: Internal Medicine

## 2021-01-31 ENCOUNTER — Non-Acute Institutional Stay: Payer: PPO | Admitting: Nurse Practitioner

## 2021-01-31 ENCOUNTER — Encounter: Payer: Self-pay | Admitting: Nurse Practitioner

## 2021-01-31 ENCOUNTER — Other Ambulatory Visit: Payer: Self-pay

## 2021-01-31 DIAGNOSIS — F419 Anxiety disorder, unspecified: Secondary | ICD-10-CM

## 2021-01-31 DIAGNOSIS — G8929 Other chronic pain: Secondary | ICD-10-CM | POA: Diagnosis not present

## 2021-01-31 DIAGNOSIS — M5440 Lumbago with sciatica, unspecified side: Secondary | ICD-10-CM

## 2021-01-31 DIAGNOSIS — N3281 Overactive bladder: Secondary | ICD-10-CM | POA: Diagnosis not present

## 2021-01-31 DIAGNOSIS — J309 Allergic rhinitis, unspecified: Secondary | ICD-10-CM

## 2021-01-31 DIAGNOSIS — K219 Gastro-esophageal reflux disease without esophagitis: Secondary | ICD-10-CM | POA: Diagnosis not present

## 2021-01-31 DIAGNOSIS — D509 Iron deficiency anemia, unspecified: Secondary | ICD-10-CM

## 2021-01-31 DIAGNOSIS — E782 Mixed hyperlipidemia: Secondary | ICD-10-CM | POA: Diagnosis not present

## 2021-01-31 DIAGNOSIS — I10 Essential (primary) hypertension: Secondary | ICD-10-CM

## 2021-01-31 DIAGNOSIS — R7303 Prediabetes: Secondary | ICD-10-CM | POA: Insufficient documentation

## 2021-01-31 MED ORDER — AMLODIPINE BESYLATE 5 MG PO TABS: 2.5000 mg | ORAL_TABLET | Freq: Every day | ORAL | 3 refills | Status: DC

## 2021-01-31 NOTE — Assessment & Plan Note (Signed)
Resolved, off Iron.

## 2021-01-31 NOTE — Assessment & Plan Note (Signed)
Hyperlipidemia, stakes Atorvastatin, LDL 67 09/24/20, update lipid panel 08/2021

## 2021-01-31 NOTE — Progress Notes (Signed)
Location: clinic [place]   Place of Service:  Clinic (12) Provider: Marlana Latus NP  Code Status: DNR Goals of Care: [place] Advanced Directives 08/25/2019  Does Patient Have a Medical Advance Directive? Yes  Type of Paramedic of [place];Living will;Out of facility DNR (pink MOST or yellow form)  Does patient want to make changes to medical advance directive? No - Patient declined  Copy of [place] in Chart? Yes - validated most recent copy scanned in chart (See row information)  Pre-existing out of facility DNR order (yellow form or pink MOST form) Yellow form placed in chart (order not valid for inpatient use);Pink MOST form placed in chart (order not valid for inpatient use)     Chief Complaint  Patient presents with  . Medical Management of Chronic Issues    Patient returns to the clinic for follow up. She would like to discuss an old medication. She has recently seen a dermatologist.     HPI: Patient is a 87 y.o. female seen today for medical management of chronic diseases.     Hx of lower back pain, chronic, taking Tylenol, Tramadol.   Allergic rhinitis, stable, on Fluticasone nasal spray qd, Claritin 24m qd.   Anxiety, Lorazepam prn available to her.   HTN, blood pressure is controlled on Losartan/HCT 100/12.575mqd, ASA, Atorvastatin.  Bun/creat 25/0.93 09/24/20  GERD, stable on Omeprazole 4043md.Hgb 12.7 09/24/20  Hyperlipidemia, stakes Atorvastatin, LDL 67 09/24/20  Prediabetes Hgb a1c 5.7 09/24/20  Past Medical History:  Diagnosis Date  . GERD (gastroesophageal reflux disease)   . Hx of cataract surgery    both eyes  . Hyperglycemia   . Hyperlipidemia   . Hypertension   . Lumbago 04/05/2016  . Macular degeneration    ? which eye(s)  . Obstructive sleep apnea   . Osteoporosis, senile   . Overactive bladder   . Squamous cell skin cancer 2/16   left lower leg    Past Surgical History:  Procedure Laterality Date  .  ABDOMINAL HYSTERECTOMY  1960's  . APPENDECTOMY  1960's  . MOLE REMOVAL  2000   facial  . TONSILLECTOMY      Allergies  Allergen Reactions  . Adhesive [Tape]   . Ampicillin   . Elemental Sulfur   . Nitrofuran Derivatives   . Zoloft [Sertraline Hcl] Other (See Comments)    Dizziness, nausea, faint feeling, and nightmares      Allergies as of 01/31/2021      Reactions   Adhesive [tape]    Ampicillin    Elemental Sulfur    Nitrofuran Derivatives    Zoloft [sertraline Hcl] Other (See Comments)   Dizziness, nausea, faint feeling, and nightmares        Medication List       Accurate as of January 31, 2021 11:59 PM. If you have any questions, ask your nurse or doctor.        acetaminophen 500 MG tablet Commonly known as: TYLENOL Take 500 mg by mouth daily.   amLODipine 5 MG tablet Commonly known as: NORVASC Take 0.5 tablets (2.5 mg total) by mouth daily. Started by: Willi Borowiak X Yoshito Gaza, NP   aspirin EC 81 MG tablet Take 1 tablet (81 mg total) by mouth daily.   atorvastatin 20 MG tablet Commonly known as: LIPITOR TAKE 1 TABLET ONCE DAILY.   Calcium Carb-Cholecalciferol 600-800 MG-UNIT Tabs Take 1 tablet by mouth 2 (two) times daily.   Fish Oil 1200 MG Caps  Take 1 capsule by mouth daily.   fluticasone 50 MCG/ACT nasal spray Commonly known as: FLONASE Place 1 spray into both nostrils daily.   ICaps Caps Take 1 capsule by mouth daily.   loratadine 10 MG tablet Commonly known as: CLARITIN Take 10 mg by mouth daily.   losartan-hydrochlorothiazide 100-12.5 MG tablet Commonly known as: HYZAAR TAKE (1) TABLET DAILY FOR HIGH BLOOD PRESSURE.   omeprazole 40 MG capsule Commonly known as: PRILOSEC TAKE 1 CAPSULE DAILY 1/2 HOUR BEFORE BREAKFAST FOR STOMACH ACID.   Systane 0.4-0.3 % Gel ophthalmic gel Generic drug: Polyethyl Glycol-Propyl Glycol Place 1 application into both eyes daily.   SYSTANE ULTRA OP Apply to eye in the morning, at noon, and at bedtime.   traMADol  50 MG tablet Commonly known as: ULTRAM TAKE (1) TABLET EVERY SIX HOURS AS NEEDED FOR SEVERE PAIN.    - MAY MAKE DROWSY -       Review of Systems:  Review of Systems  Constitutional: Negative for fatigue, fever and unexpected weight change.  HENT: Positive for hearing loss. Negative for congestion.   Eyes: Negative for visual disturbance.  Respiratory: Negative for cough, chest tightness, shortness of breath and wheezing.   Cardiovascular: Negative for chest pain, palpitations and leg swelling.  Gastrointestinal: Negative for abdominal pain and constipation.  Genitourinary: Negative for difficulty urinating, dysuria and urgency.       0-1x/night urination, occasionally urinary leakage.   Musculoskeletal: Positive for arthralgias, back pain and gait problem.  Skin: Negative for color change.  Neurological: Negative for speech difficulty, weakness and light-headedness.       Tingling in toes sometimes, better after moves around.   Psychiatric/Behavioral: Negative for behavioral problems, confusion and sleep disturbance. The patient is not nervous/anxious.     Health Maintenance  Topic Date Due  . TETANUS/TDAP  12/14/2022  . INFLUENZA VACCINE  Completed  . DEXA SCAN  Completed  . COVID-19 Vaccine  Completed  . PNA vac Low Risk Adult  Completed    Physical Exam: Vitals:   01/31/21 1330  BP: (!) 156/84  Pulse: 76  Temp: 98 F (36.7 C)  SpO2: 97%  Weight: 183 lb 9.6 oz (83.3 kg)  Height: '5\' 6"'  (1.676 m)   Body mass index is 29.63 kg/m. Physical Exam Vitals and nursing note reviewed.  Constitutional:      Appearance: Normal appearance. She is obese.  HENT:     Head: Normocephalic and atraumatic.     Nose: Nose normal.     Mouth/Throat:     Mouth: Mucous membranes are moist.  Eyes:     Extraocular Movements: Extraocular movements intact.     Conjunctiva/sclera: Conjunctivae normal.     Pupils: Pupils are equal, round, and reactive to light.  Cardiovascular:     Rate  and Rhythm: Normal rate and regular rhythm.     Heart sounds: No murmur heard.   Pulmonary:     Effort: Pulmonary effort is normal.     Breath sounds: No rales.  Abdominal:     General: Bowel sounds are normal. There is no distension.     Palpations: Abdomen is soft.     Tenderness: There is no abdominal tenderness. There is no guarding or rebound.  Musculoskeletal:     Cervical back: Normal range of motion and neck supple.     Right lower leg: No edema.     Left lower leg: No edema.  Skin:    General: Skin is warm and dry.  Neurological:     General: No focal deficit present.     Mental Status: She is alert and oriented to person, place, and time. Mental status is at baseline.     Motor: No weakness.     Coordination: Coordination normal.     Gait: Gait abnormal.  Psychiatric:        Mood and Affect: Mood normal.        Behavior: Behavior normal.        Thought Content: Thought content normal.        Judgment: Judgment normal.     Labs reviewed: Basic Metabolic Panel: Recent Labs    09/24/20 0858  NA 141  K 3.9  CL 105  CO2 28  GLUCOSE 147*  BUN 25  CREATININE 0.93*  CALCIUM 9.6   Liver Function Tests: Recent Labs    09/24/20 0858  AST 22  ALT 14  BILITOT 0.6  PROT 6.0*   No results for input(s): LIPASE, AMYLASE in the last 8760 hours. No results for input(s): AMMONIA in the last 8760 hours. CBC: Recent Labs    09/24/20 0858  WBC 6.8  NEUTROABS 3,794  HGB 12.7  HCT 38.1  MCV 95.0  PLT 188   Lipid Panel: Recent Labs    09/24/20 0858  CHOL 135  HDL 54  LDLCALC 67  TRIG 63  CHOLHDL 2.5   Lab Results  Component Value Date   HGBA1C 5.7 (H) 09/24/2020    Procedures since last visit: No results found.  Assessment/Plan  Prediabetes Prediabetes Hgb a1c 5.7 09/24/20, diet, exercise life modification, Hgb a1c in Set 2022   Hyperlipidemia Hyperlipidemia, stakes Atorvastatin, LDL 67 09/24/20, update lipid panel 08/2021   GERD  (gastroesophageal reflux disease) GERD, stable on Omeprazole 32m qd.Hgb 12.7 09/24/20. Update CBC 08/2021  HTN (hypertension) HTN, SBp in 150s since last Oct 2021, denied headache, chest pain/pressure, palpitation,  on Losartan/HCT 100/12.542mqd, ASA, Atorvastatin.  Bun/creat 25/0.93 09/24/20. Update CMP 08/2021. The patient desires better Bp control, adding Amlodipine 2.74m52md.    Anxiety Anxiety, Lorazepam prn available to her.    Allergic rhinitis Allergic rhinitis, stable, on Fluticasone nasal spray qd, Claritin 34m19m.    Lumbago Hx of lower back pain, chronic, taking Tylenol, Tramadol.   OAB (overactive bladder) Pessary, off Oxybutynin w/o no change of urinary frequency or leakage.   Iron deficiency anemia Resolved, off Iron.    Labs/tests ordered:  CBC/diff, CMP/eGFR, Hgb a1c, Lipid panel.   Next appt: 2-3 weeks f/u Bp

## 2021-01-31 NOTE — Assessment & Plan Note (Signed)
Pessary, off Oxybutynin w/o no change of urinary frequency or leakage.

## 2021-01-31 NOTE — Assessment & Plan Note (Signed)
Prediabetes Hgb a1c 5.7 09/24/20, diet, exercise life modification, Hgb a1c in Set 2022

## 2021-01-31 NOTE — Assessment & Plan Note (Signed)
Hx of lower back pain, chronic, taking Tylenol,Tramadol.  

## 2021-01-31 NOTE — Assessment & Plan Note (Addendum)
HTN, SBp in 150s since last Oct 2021, denied headache, chest pain/pressure, palpitation,  on Losartan/HCT 100/12.5mg  qd, ASA, Atorvastatin.  Bun/creat 25/0.93 09/24/20. Update CMP 08/2021. The patient desires better Bp control, adding Amlodipine 2.5mg  qd.

## 2021-01-31 NOTE — Assessment & Plan Note (Signed)
GERD, stable on Omeprazole 40mg  qd.Hgb 12.7 09/24/20. Update CBC 08/2021

## 2021-01-31 NOTE — Assessment & Plan Note (Signed)
Anxiety, Lorazepam prn available to her.

## 2021-01-31 NOTE — Assessment & Plan Note (Signed)
Allergic rhinitis, stable, on Fluticasone nasal spray qd, Claritin 10mg  qd.

## 2021-02-01 ENCOUNTER — Encounter: Payer: Self-pay | Admitting: Nurse Practitioner

## 2021-02-05 ENCOUNTER — Encounter: Payer: Self-pay | Admitting: Nurse Practitioner

## 2021-02-05 ENCOUNTER — Telehealth: Payer: Self-pay

## 2021-02-05 ENCOUNTER — Ambulatory Visit (INDEPENDENT_AMBULATORY_CARE_PROVIDER_SITE_OTHER): Payer: PPO | Admitting: Nurse Practitioner

## 2021-02-05 ENCOUNTER — Other Ambulatory Visit: Payer: Self-pay

## 2021-02-05 DIAGNOSIS — E2839 Other primary ovarian failure: Secondary | ICD-10-CM

## 2021-02-05 DIAGNOSIS — Z0000 Encounter for general adult medical examination without abnormal findings: Secondary | ICD-10-CM

## 2021-02-05 NOTE — Patient Instructions (Signed)
Jean [name] , Thank you for taking time to come for your Medicare Wellness Visit. I appreciate your ongoing commitment to your health goals. Please review the following plan we discussed and let me know if I can assist you in the future.   Screening recommendations/referrals: Colonoscopy aged out Mammogram aged out Bone Density due call [phone]  Recommended yearly ophthalmology/optometry visit for glaucoma screening and checkup Recommended yearly dental visit for hygiene and checkup  Vaccinations: Influenza vaccine up to date  Pneumococcal vaccine up to date Tdap vaccine- up to date Shingles vaccine RECOMMENDED- to get at your local pharmacy    Advanced directives: on file.   Conditions/risks identified: advanced age.   Next appointment: 1 year    Preventive Care 35 Years and Older, Female Preventive care refers to lifestyle choices and visits with your health care provider that can promote health and wellness. What does preventive care include?  A yearly physical exam. This is also called an annual well check.  Dental exams once or twice a year.  Routine eye exams. Ask your health care provider how often you should have your eyes checked.  Personal lifestyle choices, including:  Daily care of your teeth and gums.  Regular physical activity.  Eating a healthy diet.  Avoiding tobacco and drug use.  Limiting alcohol use.  Practicing safe sex.  Taking low-dose aspirin every day.  Taking vitamin and mineral supplements as recommended by your health care provider. What happens during an annual well check? The services and screenings done by your health care provider during your annual well check will depend on your age, overall health, lifestyle risk factors, and family history of disease. Counseling  Your health care provider may ask you questions about your:  Alcohol use.  Tobacco use.  Drug use.  Emotional well-being.  Home and relationship  well-being.  Sexual activity.  Eating habits.  History of falls.  Memory and ability to understand (cognition).  Work and work Statistician.  Reproductive health. Screening  You may have the following tests or measurements:  Height, weight, and BMI.  Blood pressure.  Lipid and cholesterol levels. These may be checked every 5 years, or more frequently if you are over 60 years old.  Skin check.  Lung cancer screening. You may have this screening every year starting at age 60 if you have a 30-pack-year history of smoking and currently smoke or have quit within the past 15 years.  Fecal occult blood test (FOBT) of the stool. You may have this test every year starting at age 69.  Flexible sigmoidoscopy or colonoscopy. You may have a sigmoidoscopy every 5 years or a colonoscopy every 10 years starting at age 53.  Hepatitis C blood test.  Hepatitis B blood test.  Sexually transmitted disease (STD) testing.  Diabetes screening. This is done by checking your blood sugar (glucose) after you have not eaten for a while (fasting). You may have this done every 1-3 years.  Bone density scan. This is done to screen for osteoporosis. You may have this done starting at age 53.  Mammogram. This may be done every 1-2 years. Talk to your health care provider about how often you should have regular mammograms. Talk with your health care provider about your test results, treatment options, and if necessary, the need for more tests. Vaccines  Your health care provider may recommend certain vaccines, such as:  Influenza vaccine. This is recommended every year.  Tetanus, diphtheria, and acellular pertussis (Tdap, Td) vaccine. You may  need a Td booster every 10 years.  Zoster vaccine. You may need this after age 70.  Pneumococcal 13-valent conjugate (PCV13) vaccine. One dose is recommended after age 60.  Pneumococcal polysaccharide (PPSV23) vaccine. One dose is recommended after age  56. Talk to your health care provider about which screenings and vaccines you need and how often you need them. This information is not intended to replace advice given to you by your health care provider. Make sure you discuss any questions you have with your health care provider. Document Released: 01/11/2016 Document Revised: 09/03/2016 Document Reviewed: 10/16/2015 Elsevier Interactive Patient Education  2017 [place] Prevention in the Home Falls can cause injuries. They can happen to people of all ages. There are many things you can do to make your home safe and to help prevent falls. What can I do on the outside of my home?  Regularly fix the edges of walkways and driveways and fix any cracks.  Remove anything that might make you trip as you walk through a door, such as a raised step or threshold.  Trim any bushes or trees on the path to your home.  Use bright outdoor lighting.  Clear any walking paths of anything that might make someone trip, such as rocks or tools.  Regularly check to see if handrails are loose or broken. Make sure that both sides of any steps have handrails.  Any raised decks and porches should have guardrails on the edges.  Have any leaves, snow, or ice cleared regularly.  Use sand or salt on walking paths during winter.  Clean up any spills in your garage right away. This includes oil or grease spills. What can I do in the bathroom?  Use night lights.  Install grab bars by the toilet and in the tub and shower. Do not use towel bars as grab bars.  Use non-skid mats or decals in the tub or shower.  If you need to sit down in the shower, use a plastic, non-slip stool.  Keep the floor dry. Clean up any water that spills on the floor as soon as it happens.  Remove soap buildup in the tub or shower regularly.  Attach bath mats securely with double-sided non-slip rug tape.  Do not have throw rugs and other things on the floor that can make  you trip. What can I do in the bedroom?  Use night lights.  Make sure that you have a light by your bed that is easy to reach.  Do not use any sheets or blankets that are too big for your bed. They should not hang down onto the floor.  Have a firm chair that has side arms. You can use this for support while you get dressed.  Do not have throw rugs and other things on the floor that can make you trip. What can I do in the kitchen?  Clean up any spills right away.  Avoid walking on wet floors.  Keep items that you use a lot in easy-to-reach places.  If you need to reach something above you, use a strong step stool that has a grab bar.  Keep electrical cords out of the way.  Do not use floor polish or wax that makes floors slippery. If you must use wax, use non-skid floor wax.  Do not have throw rugs and other things on the floor that can make you trip. What can I do with my stairs?  Do not leave any items on  the stairs.  Make sure that there are handrails on both sides of the stairs and use them. Fix handrails that are broken or loose. Make sure that handrails are as long as the stairways.  Check any carpeting to make sure that it is firmly attached to the stairs. Fix any carpet that is loose or worn.  Avoid having throw rugs at the top or bottom of the stairs. If you do have throw rugs, attach them to the floor with carpet tape.  Make sure that you have a light switch at the top of the stairs and the bottom of the stairs. If you do not have them, ask someone to add them for you. What else can I do to help prevent falls?  Wear shoes that:  Do not have high heels.  Have rubber bottoms.  Are comfortable and fit you well.  Are closed at the toe. Do not wear sandals.  If you use a stepladder:  Make sure that it is fully opened. Do not climb a closed stepladder.  Make sure that both sides of the stepladder are locked into place.  Ask someone to hold it for you, if  possible.  Clearly mark and make sure that you can see:  Any grab bars or handrails.  First and last steps.  Where the edge of each step is.  Use tools that help you move around (mobility aids) if they are needed. These include:  Canes.  Walkers.  Scooters.  Crutches.  Turn on the lights when you go into a dark area. Replace any light bulbs as soon as they burn out.  Set up your furniture so you have a clear path. Avoid moving your furniture around.  If any of your floors are uneven, fix them.  If there are any pets around you, be aware of where they are.  Review your medicines with your doctor. Some medicines can make you feel dizzy. This can increase your chance of falling. Ask your doctor what other things that you can do to help prevent falls. This information is not intended to replace advice given to you by your health care provider. Make sure you discuss any questions you have with your health care provider. Document Released: 10/11/2009 Document Revised: 05/22/2016 Document Reviewed: 01/19/2015 Elsevier Interactive Patient Education  2017 [employer].

## 2021-02-05 NOTE — Progress Notes (Signed)
Subjective:   Jean [name] is a 87 y.o. female who presents for Medicare Annual (Subsequent) preventive examination.  Review of Systems     Cardiac Risk Factors include: advanced age (>1men, >47 women);sedentary lifestyle;hypertension;dyslipidemia     Objective:    Today's Vitals   02/05/21 1428  PainSc: 5    There is no height or weight on file to calculate BMI.  Advanced Directives 02/05/2021 08/25/2019 08/25/2019 06/09/2019 07/20/2018 07/20/2018 07/20/2018  Does Patient Have a Medical Advance Directive? Yes Yes Yes Yes Yes - Yes  Type of Advance Directive Living will;Out of facility DNR (pink MOST or yellow form) [place];Living will;Out of facility DNR (pink MOST or yellow form) [place];Living will;Out of facility DNR (pink MOST or yellow form) Living will;Out of facility DNR (pink MOST or yellow form) Living will Living will Living will  Does patient want to make changes to medical advance directive? No - Patient declined No - Patient declined No - Patient declined No - Patient declined No - Patient declined Yes (MAU/Ambulatory/Procedural Areas - Information given) No - Patient declined  Copy of [place] in Chart? - Yes - validated most recent copy scanned in chart (See row information) Yes - validated most recent copy scanned in chart (See row information) - - - -  Pre-existing out of facility DNR order (yellow form or pink MOST form) Pink MOST form placed in chart (order not valid for inpatient use) Yellow form placed in chart (order not valid for inpatient use);Pink MOST form placed in chart (order not valid for inpatient use) - Pink MOST form placed in chart (order not valid for inpatient use) - - -    Current Medications (verified) Outpatient Encounter Medications as of 02/05/2021  Medication Sig  . acetaminophen (TYLENOL) 500 MG tablet Take 500 mg by mouth daily.   Marland Kitchen amLODipine (NORVASC) 5 MG tablet Take 0.5 tablets (2.5  mg total) by mouth daily.  Marland Kitchen aspirin EC 81 MG tablet Take 1 tablet (81 mg total) by mouth daily.  Marland Kitchen atorvastatin (LIPITOR) 20 MG tablet TAKE 1 TABLET ONCE DAILY.  . Calcium Carb-Cholecalciferol 600-800 MG-UNIT TABS Take 1 tablet by mouth 2 (two) times daily.   . fluticasone (FLONASE) 50 MCG/ACT nasal spray Place 1 spray into both nostrils daily. Each nostril  . loratadine (CLARITIN) 10 MG tablet Take 10 mg by mouth daily.  Marland Kitchen losartan-hydrochlorothiazide (HYZAAR) 100-12.5 MG tablet TAKE (1) TABLET DAILY FOR HIGH BLOOD PRESSURE.  . Multiple Vitamins-Minerals (ICAPS) CAPS Take 1 capsule by mouth daily.   . Omega-3 Fatty Acids (FISH OIL) 1200 MG CAPS Take 1 capsule by mouth daily.   Marland Kitchen omeprazole (PRILOSEC) 40 MG capsule TAKE 1 CAPSULE DAILY 1/2 HOUR BEFORE BREAKFAST FOR STOMACH ACID.  Marland Kitchen Polyethyl Glycol-Propyl Glycol (SYSTANE ULTRA OP) Apply to eye in the morning, at noon, and at bedtime.  Vladimir Faster Glycol-Propyl Glycol (SYSTANE) 0.4-0.3 % GEL ophthalmic gel Place 1 application into both eyes daily.  . traMADol (ULTRAM) 50 MG tablet TAKE (1) TABLET EVERY SIX HOURS AS NEEDED FOR SEVERE PAIN.    - MAY MAKE DROWSY -  . [DISCONTINUED] fluticasone (FLONASE) 50 MCG/ACT nasal spray Place 1 spray into both nostrils daily.   No facility-administered encounter medications on file as of 02/05/2021.    Allergies (verified) Adhesive [tape], Ampicillin, Elemental sulfur, Nitrofuran derivatives, and Zoloft [sertraline hcl]   History: Past Medical History:  Diagnosis Date  . GERD (gastroesophageal reflux disease)   .  Hx of cataract surgery    both eyes  . Hyperglycemia   . Hyperlipidemia   . Hypertension   . Lumbago 04/05/2016  . Macular degeneration    ? which eye(s)  . Obstructive sleep apnea   . Osteoporosis, senile   . Overactive bladder   . Squamous cell skin cancer 2/16   left lower leg   Past Surgical History:  Procedure Laterality Date  . ABDOMINAL HYSTERECTOMY  1960's  . APPENDECTOMY   1960's  . MOLE REMOVAL  2000   facial  . TONSILLECTOMY     Family History  Problem Relation Age of Onset  . Heart disease Mother   . Heart disease Father    Social History   Socioeconomic History  . Marital status: Widowed    Spouse name: Not on file  . Number of children: Not on file  . Years of education: Not on file  . Highest education level: Not on file  Occupational History  . 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 Smoking status: Never Smoker  . Smokeless tobacco: Never Used  Vaping Use  . Vaping Use: Never used  Substance and Sexual Activity  . Alcohol use: Not Currently    Comment: occassionally 1/2 glass of wine 1-2 x month  . Drug use: No  . Sexual activity: Never  Other Topics Concern  . Not on file  Social History Narrative   [facility] since [facility]   Widow   Never smoked   Alcohol occasionally 1/2 glass of wine   Exercise none   POA, Living Will   Walks with walker   Social Determinants of Health   Financial Resource Strain: Not on file  Food Insecurity: Not on file  Transportation Needs: Not on file  Physical Activity: Not on file  Stress: Not on file  Social Connections: Not on file    Tobacco Counseling Counseling given: Not Answered   Clinical Intake:  Pre-visit preparation completed: Yes  Pain : 0-10 Pain Score: 5  Pain Type: Chronic pain Pain Location: Back Pain Orientation: Lower Pain Descriptors / Indicators: Aching Pain Onset: More than a month ago Pain Frequency: Constant Pain Relieving Factors: pain pill  Pain Relieving Factors: pain pill  BMI - recorded: 29 Nutritional Status: BMI 25 -29 Overweight Nutritional Risks: None Diabetes: No  How often do you need to have someone help you when you read instructions, pamphlets, or other written materials from your doctor or pharmacy?: 2 - Rarely  Diabetic?no         Activities of Daily Living In your present state of health, do  you have any difficulty performing the following activities: 02/05/2021  Hearing? N  Vision? N  Difficulty concentrating or making decisions? N  Walking or climbing stairs? Y  Comment using walker  Dressing or bathing? N  Doing errands, shopping? Y  Comment does not Physiological scientist and eating ? N  Using the Toilet? N  Do you have problems with loss of bowel control? N  Managing your Medications? N  Managing your Finances? N  Housekeeping or managing your Housekeeping? N  Some recent data might be hidden    Patient Care Team: Mast, Man X, NP as PCP - General (Internal Medicine) Mast, Man X, NP as Nurse Practitioner (Internal Medicine)  Indicate any recent Medical Services you may have received from other than [facility] providers in the past year (date may be approximate).     Assessment:  This is a routine wellness examination for Jean [name].  Hearing/Vision screen  Hearing Screening   125Hz  250Hz  500Hz  1000Hz  2000Hz  3000Hz  4000Hz  6000Hz  8000Hz   Right ear:           Left ear:           Comments: Patient has a slight hearing problem.  Vision Screening Comments: Patient wears glasses. Patient had last eye exam at least a year ago. Patient sees Dr. Raenette Rover.([facility]  Dietary issues and exercise activities discussed: Current Exercise Habits: The patient does not participate in regular exercise at present  Goals    . DIET - REDUCE SUGAR INTAKE    . Increase physical activity      Depression Screen PHQ 2/9 Scores 02/05/2021 10/28/2018 11/03/2017 09/23/2016 05/29/2016 04/17/2016 11/29/2015  PHQ - 2 Score 0 0 0 1 0 1 0    Fall Risk Fall Risk  02/05/2021 09/28/2020 06/14/2020 02/02/2020 06/09/2019  Falls in the past year? 0 0 0 0 0  Number falls in past yr: 0 0 0 0 0  Injury with Fall? 0 - - - 0    FALL RISK PREVENTION PERTAINING TO THE HOME:  Any stairs in or around the home? Yes  If so, are there any without handrails? No  Home free of loose throw rugs in walkways, pet  beds, electrical cords, etc? No  Adequate lighting in your home to reduce risk of falls? Yes   ASSISTIVE DEVICES UTILIZED TO PREVENT FALLS:  Life alert? Yes  Use of a cane, walker or w/c? Yes  Grab bars in the bathroom? Yes  Shower chair or bench in shower? Yes  Elevated toilet seat or a handicapped toilet? Yes   TIMED UP AND GO:  Was the test performed? No .    Cognitive Function: MMSE - Mini Mental State Exam 09/15/2019 08/25/2019 07/20/2018 11/03/2017 07/03/2016  Not completed: (No Data) (No Data) (No Data) (No Data) -  Orientation to time 4 5 5 5 5   Orientation to Place 5 5 5 5 5   Registration 3 3 3 3 3   Attention/ Calculation 5 5 5 5 5   Recall 3 0 1 2 2   Language- name 2 objects 2 2 2 2 2   Language- repeat 1 1 1 1 1   Language- follow 3 step command 3 3 3 3 3   Language- read & follow direction 1 1 1 1 1   Write a sentence 1 1 1 1 1   Copy design 1 1 1 1 1   Total score 29 27 28 29 29      6CIT Screen 02/05/2021  What Year? 0 points  What month? 0 points  What time? 0 points  Count back from 20 0 points  Months in reverse 0 points  Repeat phrase 2 points  Total Score 2    Immunizations Immunization History  Administered Date(s) Administered  . Fluad Quad(high Dose 65+) 10/12/2019  . Influenza Whole 09/30/2018  . Influenza, High Dose Seasonal PF 10/07/2017  . Influenza-Unspecified 09/27/2015, 10/09/2016, 10/10/2020  . Moderna Sars-Covid-2 Vaccination 01/02/2020, 01/30/2020, 11/06/2020  . Pneumococcal Conjugate-13 01/23/2015  . Pneumococcal Polysaccharide-23 10/20/2007  . Td 12/14/2012    TDAP status: Up to date  Flu Vaccine status: Up to date  Pneumococcal vaccine status: Up to date  Covid-19 vaccine status: Completed vaccines  Qualifies for Shingles Vaccine? Yes   Zostavax completed No   Shingrix Completed?: No.    Education has been provided regarding the importance of this vaccine. Patient has  been advised to call insurance company to determine out of pocket  expense if they have not yet received this vaccine. Advised may also receive vaccine at local pharmacy or Health Dept. Verbalized acceptance and understanding.  Screening Tests Health Maintenance  Topic Date Due  . TETANUS/TDAP  12/14/2022  . INFLUENZA VACCINE  Completed  . DEXA SCAN  Completed  . COVID-19 Vaccine  Completed  . PNA vac Low Risk Adult  Completed    Health Maintenance  There are no preventive care reminders to display for this patient.  Colorectal cancer screening: No longer required.   Mammogram status: No longer required due to age.  Bone Density status: Completed 2019. Results reflect: Bone density results: OSTEOPENIA. Repeat every 2 years.  Lung Cancer Screening: (Low Dose CT Chest recommended if Age 36-80 years, 30 pack-year currently smoking OR have quit w/in 15years.) does not qualify.  Additional Screening:  Hepatitis C Screening: does not qualify;   Vision Screening: Recommended annual ophthalmology exams for early detection of glaucoma and other disorders of the eye. Is the patient up to date with their annual eye exam?  No  Who is the provider or what is the name of the office in which the patient attends annual eye exams?  Dr. Raenette Rover If pt is not established with a provider, would they like to be referred to a provider to establish care? No .   Dental Screening: Recommended annual dental exams for proper oral hygiene  Community Resource Referral / Chronic Care Management: CRR required this visit?  No   CCM required this visit?  No      Plan:     I have personally reviewed and noted the following in the patient's chart:   . Medical and social history . Use of alcohol, tobacco or illicit drugs  . Current medications and supplements . Functional ability and status . Nutritional status . Physical activity . Advanced directives . List of other physicians . Hospitalizations, surgeries, and ER visits in previous 12  months . Vitals . Screenings to include cognitive, depression, and falls . Referrals and appointments  In addition, I have reviewed and discussed with patient certain preventive protocols, quality metrics, and best practice recommendations. A written personalized care plan for preventive services as well as general preventive health recommendations were provided to patient.     Lauree Chandler, NP   02/05/2021   Virtual Visit via Telephone Note  I connected with@ on 02/05/21 at  2:15 PM EST by telephone and verified that I am speaking with the correct person using two identifiers.  Location: Patient: home Provider: [facility]   I discussed the limitations, risks, security and privacy concerns of performing an evaluation and management service by telephone and the availability of in person appointments. I also discussed with the patient that there may be a patient responsible charge related to this service. The patient expressed understanding and agreed to proceed.   I discussed the assessment and treatment plan with the patient. The patient was provided an opportunity to ask questions and all were answered. The patient agreed with the plan and demonstrated an understanding of the instructions.   The patient was advised to call back or seek an in-person evaluation if the symptoms worsen or if the condition fails to improve as anticipated.  I provided 22 minutes of non-face-to-face time during this encounter.  Carlos American. Harle Battiest Avs printed and mailed

## 2021-02-05 NOTE — Progress Notes (Signed)
This service is provided via telemedicine  No vital signs collected/recorded due to the encounter was a telemedicine visit.   Location of patient (ex: home, work): Home  Patient consents to a telephone visit:  Yes, see encounter dated 02/05/2021  Location of the provider (ex: office, home): [place]  Name of any referring provider:  Mast, Man X, NP  Names of all persons participating in the telemedicine service and their role in the encounter: Sherrie Mustache, Nurse Practitioner, Carroll Kinds, CMA, and patient.   Time spent on call:  20 minutes with medical assistant

## 2021-02-05 NOTE — Telephone Encounter (Signed)
Ms. jayelyn, barno are scheduled for a virtual visit with your provider today.    Just as we do with appointments in the office, we must obtain your consent to participate.  Your consent will be active for this visit and any virtual visit you may have with one of our providers in the next 365 days.    If you have a MyChart account, I can also send a copy of this consent to you electronically.  All virtual visits are billed to your insurance company just like a traditional visit in the office.  As this is a virtual visit, video technology does not allow for your provider to perform a traditional examination.  This may limit your provider's ability to fully assess your condition.  If your provider identifies any concerns that need to be evaluated in person or the need to arrange testing such as labs, EKG, etc, we will make arrangements to do so.    Although advances in technology are sophisticated, we cannot ensure that it will always work on either your end or our end.  If the connection with a video visit is poor, we may have to switch to a telephone visit.  With either a video or telephone visit, we are not always able to ensure that we have a secure connection.   I need to obtain your verbal consent now.   Are you willing to proceed with your visit today?   Arla Boutwell has provided verbal consent on 02/05/2021 for a virtual visit (video or telephone).   Carroll Kinds, CMA 02/05/2021  2:20 PM

## 2021-02-07 ENCOUNTER — Non-Acute Institutional Stay: Payer: PPO | Admitting: Nurse Practitioner

## 2021-02-07 ENCOUNTER — Encounter: Payer: Self-pay | Admitting: Nurse Practitioner

## 2021-02-07 ENCOUNTER — Other Ambulatory Visit: Payer: Self-pay

## 2021-02-07 DIAGNOSIS — I10 Essential (primary) hypertension: Secondary | ICD-10-CM

## 2021-02-07 DIAGNOSIS — F419 Anxiety disorder, unspecified: Secondary | ICD-10-CM

## 2021-02-07 DIAGNOSIS — K219 Gastro-esophageal reflux disease without esophagitis: Secondary | ICD-10-CM

## 2021-02-07 DIAGNOSIS — M5440 Lumbago with sciatica, unspecified side: Secondary | ICD-10-CM

## 2021-02-07 DIAGNOSIS — G8929 Other chronic pain: Secondary | ICD-10-CM | POA: Diagnosis not present

## 2021-02-07 DIAGNOSIS — E782 Mixed hyperlipidemia: Secondary | ICD-10-CM | POA: Diagnosis not present

## 2021-02-07 DIAGNOSIS — J309 Allergic rhinitis, unspecified: Secondary | ICD-10-CM

## 2021-02-07 DIAGNOSIS — R7303 Prediabetes: Secondary | ICD-10-CM | POA: Diagnosis not present

## 2021-02-07 MED ORDER — METOPROLOL SUCCINATE ER 25 MG PO TB24: 25.0000 mg | ORAL_TABLET | Freq: Every day | ORAL | 3 refills | Status: DC

## 2021-02-07 NOTE — Assessment & Plan Note (Signed)
Hgb a1c 5.7 09/24/20

## 2021-02-07 NOTE — Assessment & Plan Note (Signed)
chronic, taking Tylenol, Tramadol.

## 2021-02-07 NOTE — Progress Notes (Signed)
Location:   Clinic   Place of Service:  Clinic (12) Provider: Marlana Latus NP  Code Status: DNR Goals of Care: [place] 02/05/2021  Does Patient Have a Medical Advance Directive? Yes  Type of Advance Directive Living will;Out of facility DNR (pink MOST or yellow form)  Does patient want to make changes to medical advance directive? No - Patient declined  Copy of [place] in Chart? -  Pre-existing out of facility DNR order (yellow form or pink MOST form) Pink MOST form placed in chart (order not valid for inpatient use)     Chief Complaint  Patient presents with  . Medical Management of Chronic Issues    Patient returns to the clinic, cc of headache that lasted only a few hours. She has not had today's dose.     HPI: Patient is a 87 y.o. female seen today for an acute visit for started Amlodipine Monday, experienced a headache episode yesterday/Wednesday. Headache was on the top of her head, nature is like a drilling, lasted 3-4 hours, stopped with no intervention. The patient denied change of vision, nausea, vomiting, chest pain/pressure, palpitation, or focal weakness associated with her headache episode. No further headache, the patient stopped taking Amlodipine this morning.   Hx of lower back pain, chronic, taking Tylenol, Tramadol.              Allergic rhinitis, stable, on Fluticasone nasal spray qd, Claritin 34m qd.              Anxiety, Lorazepam prn available to her.              HTN, takes  Losartan/HCT 100/12.527mqd, ASA, Atorvastatin.Bun/creat 25/0.93 09/24/20             GERD, stable on Omeprazole 4039md.Hgb 12.7 09/24/20             Hyperlipidemia, takes Atorvastatin, LDL 67 09/24/20             Prediabetes Hgb a1c 5.7 09/24/20   Past Medical History:  Diagnosis Date  . GERD (gastroesophageal reflux disease)   . Hx of cataract surgery    both eyes  . Hyperglycemia   . Hyperlipidemia   . Hypertension   . Lumbago 04/05/2016  .  Macular degeneration    ? which eye(s)  . Obstructive sleep apnea   . Osteoporosis, senile   . Overactive bladder   . Squamous cell skin cancer 2/16   left lower leg    Past Surgical History:  Procedure Laterality Date  . ABDOMINAL HYSTERECTOMY  1960's  . APPENDECTOMY  1960's  . MOLE REMOVAL  2000   facial  . TONSILLECTOMY      Allergies  Allergen Reactions  . Adhesive [Tape]   . Ampicillin   . Elemental Sulfur   . Nitrofuran Derivatives   . Zoloft [Sertraline Hcl] Other (See Comments)    Dizziness, nausea, faint feeling, and nightmares      Allergies as of 02/07/2021      Reactions   Adhesive [tape]    Ampicillin    Elemental Sulfur    Nitrofuran Derivatives    Zoloft [sertraline Hcl] Other (See Comments)   Dizziness, nausea, faint feeling, and nightmares        Medication List       Accurate as of February 07, 2021 11:59 PM. If you have any questions, ask your nurse or doctor.        STOP taking  these medications   amLODipine 5 MG tablet Commonly known as: NORVASC Stopped by: Mart Colpitts X Beverlyann Broxterman, NP     TAKE these medications   acetaminophen 500 MG tablet Commonly known as: TYLENOL Take 500 mg by mouth daily.   aspirin EC 81 MG tablet Take 1 tablet (81 mg total) by mouth daily.   atorvastatin 20 MG tablet Commonly known as: LIPITOR TAKE 1 TABLET ONCE DAILY.   Calcium Carb-Cholecalciferol 600-800 MG-UNIT Tabs Take 1 tablet by mouth 2 (two) times daily.   Fish Oil 1200 MG Caps Take 1 capsule by mouth daily.   fluticasone 50 MCG/ACT nasal spray Commonly known as: FLONASE Place 1 spray into both nostrils daily. Each nostril   ICaps Caps Take 1 capsule by mouth daily.   loratadine 10 MG tablet Commonly known as: CLARITIN Take 10 mg by mouth daily.   LORazepam 0.5 MG tablet Commonly known as: ATIVAN Take 0.5 mg by mouth daily as needed for anxiety.   losartan-hydrochlorothiazide 100-12.5 MG tablet Commonly known as: HYZAAR TAKE (1) TABLET DAILY  FOR HIGH BLOOD PRESSURE.   metoprolol succinate 25 MG 24 hr tablet Commonly known as: TOPROL-XL Take 1 tablet (25 mg total) by mouth daily. Started by: Thadius Smisek X Yi Falletta, NP   omeprazole 40 MG capsule Commonly known as: PRILOSEC TAKE 1 CAPSULE DAILY 1/2 HOUR BEFORE BREAKFAST FOR STOMACH ACID.   Systane 0.4-0.3 % Gel ophthalmic gel Generic drug: Polyethyl Glycol-Propyl Glycol Place 1 application into both eyes daily.   SYSTANE ULTRA OP Apply to eye in the morning, at noon, and at bedtime.   traMADol 50 MG tablet Commonly known as: ULTRAM TAKE (1) TABLET EVERY SIX HOURS AS NEEDED FOR SEVERE PAIN.    - MAY MAKE DROWSY -       Review of Systems:  Review of Systems  Constitutional: Negative for activity change, fatigue and fever.  HENT: Positive for hearing loss. Negative for congestion.   Eyes: Negative for visual disturbance.  Respiratory: Negative for cough, chest tightness, shortness of breath and wheezing.   Cardiovascular: Negative for chest pain, palpitations and leg swelling.  Gastrointestinal: Negative for abdominal pain and constipation.  Genitourinary: Negative for difficulty urinating, dysuria and urgency.       0-1x/night urination, occasionally urinary leakage.   Musculoskeletal: Positive for arthralgias, back pain and gait problem.  Skin: Negative for color change.  Neurological: Positive for headaches. Negative for speech difficulty, weakness and light-headedness.       Tingling in toes sometimes, better after moves around. Resolved headache episode yesterday w/o intervention, it was like drilling on the top of head, lasted a few hours. The  patient stopped taking Amlodipine today and thinks Amlodipine is the culprit, wants to switch to different blood pressure medication.   Psychiatric/Behavioral: Negative for behavioral problems, confusion and sleep disturbance. The patient is not nervous/anxious.     Health Maintenance  Topic Date Due  . TETANUS/TDAP  12/14/2022  .  INFLUENZA VACCINE  Completed  . DEXA SCAN  Completed  . COVID-19 Vaccine  Completed  . PNA vac Low Risk Adult  Completed    Physical Exam: Vitals:   02/07/21 1437  BP: (!) 162/90  Pulse: 91  Temp: 98.1 F (36.7 C)  SpO2: 96%  Weight: 185 lb (83.9 kg)  Height: '5\' 6"'  (1.676 m)   Body mass index is 29.86 kg/m. Physical Exam Vitals and nursing note reviewed.  Constitutional:      Appearance: Normal appearance. She is obese.  HENT:  Head: Normocephalic and atraumatic.     Nose: Nose normal.     Mouth/Throat:     Mouth: Mucous membranes are moist.  Eyes:     Extraocular Movements: Extraocular movements intact.     Conjunctiva/sclera: Conjunctivae normal.     Pupils: Pupils are equal, round, and reactive to light.  Cardiovascular:     Rate and Rhythm: Normal rate and regular rhythm.     Heart sounds: No murmur heard.   Pulmonary:     Effort: Pulmonary effort is normal.     Breath sounds: No rales.  Abdominal:     General: Bowel sounds are normal. There is no distension.     Palpations: Abdomen is soft.     Tenderness: There is no abdominal tenderness. There is no guarding or rebound.  Musculoskeletal:     Cervical back: Normal range of motion and neck supple.     Right lower leg: No edema.     Left lower leg: No edema.  Skin:    General: Skin is warm and dry.  Neurological:     General: No focal deficit present.     Mental Status: She is alert and oriented to person, place, and time. Mental status is at baseline.     Cranial Nerves: No cranial nerve deficit.     Motor: No weakness.     Coordination: Coordination normal.     Gait: Gait abnormal.  Psychiatric:        Mood and Affect: Mood normal.        Behavior: Behavior normal.        Thought Content: Thought content normal.        Judgment: Judgment normal.     Labs reviewed: Basic Metabolic Panel: Recent Labs    09/24/20 0858  NA 141  K 3.9  CL 105  CO2 28  GLUCOSE 147*  BUN 25  CREATININE  0.93*  CALCIUM 9.6   Liver Function Tests: Recent Labs    09/24/20 0858  AST 22  ALT 14  BILITOT 0.6  PROT 6.0*   No results for input(s): LIPASE, AMYLASE in the last 8760 hours. No results for input(s): AMMONIA in the last 8760 hours. CBC: Recent Labs    09/24/20 0858  WBC 6.8  NEUTROABS 3,794  HGB 12.7  HCT 38.1  MCV 95.0  PLT 188   Lipid Panel: Recent Labs    09/24/20 0858  CHOL 135  HDL 54  LDLCALC 67  TRIG 63  CHOLHDL 2.5   Lab Results  Component Value Date   HGBA1C 5.7 (H) 09/24/2020    Procedures since last visit: No results found.  Assessment/Plan HTN (hypertension)  started Amlodipine Monday, experienced a headache episode yesterday/Wednesday. Headache was on the top of her head, nature is like a drilling, lasted 3-4 hours, stopped with no intervention. The patient denied change of vision, nausea, vomiting, chest pain/pressure, palpitation, or focal weakness associated with her headache episode. No further headache, the patient stopped taking Amlodipine this morning.  Dc Amlodipine due to possible causing of  HA, will try Metoprolol 89m qd, continue Losartan/HCT. Update CBC/diff, CMP/eGFR, TSH   Lumbago chronic, taking Tylenol, Tramadol.    Allergic rhinitis stable, on Fluticasone nasal spray qd, Claritin 169mqd.    Anxiety Lorazepam prn available to her.    GERD (gastroesophageal reflux disease) stable on Omeprazole 4029md.Hgb 12.7 09/24/20   Hyperlipidemia takes Atorvastatin, LDL 67 09/24/20   Prediabetes Hgb a1c 5.7 09/24/20  Labs/tests ordered:  Declined CBC/diff CMP/eGFR, TSH  Next appt:  02/14/2021

## 2021-02-07 NOTE — Assessment & Plan Note (Signed)
stable on Omeprazole 40mg  qd.Hgb 12.7 09/24/20

## 2021-02-07 NOTE — Assessment & Plan Note (Addendum)
started Amlodipine Monday, experienced a headache episode yesterday/Wednesday. Headache was on the top of her head, nature is like a drilling, lasted 3-4 hours, stopped with no intervention. The patient denied change of vision, nausea, vomiting, chest pain/pressure, palpitation, or focal weakness associated with her headache episode. No further headache, the patient stopped taking Amlodipine this morning.  Dc Amlodipine due to possible causing of  HA, will try Metoprolol 16m qd, continue Losartan/HCT. Update CBC/diff, CMP/eGFR, TSH

## 2021-02-07 NOTE — Assessment & Plan Note (Signed)
Lorazepam prn available to her.  

## 2021-02-07 NOTE — Assessment & Plan Note (Signed)
takes Atorvastatin, LDL 67 09/24/20

## 2021-02-07 NOTE — Assessment & Plan Note (Signed)
stable, on Fluticasone nasal spray qd, Claritin 10mg  qd.

## 2021-02-08 ENCOUNTER — Encounter: Payer: Self-pay | Admitting: Nurse Practitioner

## 2021-02-11 ENCOUNTER — Other Ambulatory Visit: Payer: Self-pay | Admitting: Nurse Practitioner

## 2021-02-12 ENCOUNTER — Other Ambulatory Visit: Payer: Self-pay

## 2021-02-12 DIAGNOSIS — I10 Essential (primary) hypertension: Secondary | ICD-10-CM | POA: Diagnosis not present

## 2021-02-13 LAB — CBC WITH DIFFERENTIAL/PLATELET
Absolute Monocytes: 640 cells/uL (ref 200–950)
Basophils Absolute: 66 cells/uL (ref 0–200)
Basophils Relative: 0.8 %
Eosinophils Absolute: 189 cells/uL (ref 15–500)
Eosinophils Relative: 2.3 %
HCT: 37.1 % (ref 35.0–45.0)
Hemoglobin: 12.2 g/dL (ref 11.7–15.5)
Lymphs Abs: 2370 cells/uL (ref 850–3900)
MCH: 30.8 pg (ref 27.0–33.0)
MCHC: 32.9 g/dL (ref 32.0–36.0)
MCV: 93.7 fL (ref 80.0–100.0)
MPV: 10.1 fL (ref 7.5–12.5)
Monocytes Relative: 7.8 %
Neutro Abs: 4936 cells/uL (ref 1500–7800)
Neutrophils Relative %: 60.2 %
Platelets: 191 10*3/uL (ref 140–400)
RBC: 3.96 10*6/uL (ref 3.80–5.10)
RDW: 13 % (ref 11.0–15.0)
Total Lymphocyte: 28.9 %
WBC: 8.2 10*3/uL (ref 3.8–10.8)

## 2021-02-13 LAB — COMPLETE METABOLIC PANEL WITH GFR
AG Ratio: 2.2 (calc) (ref 1.0–2.5)
ALT: 16 U/L (ref 6–29)
AST: 20 U/L (ref 10–35)
Albumin: 4.2 g/dL (ref 3.6–5.1)
Alkaline phosphatase (APISO): 73 U/L (ref 37–153)
BUN: 25 mg/dL (ref 7–25)
CO2: 28 mmol/L (ref 20–32)
Calcium: 9.6 mg/dL (ref 8.6–10.4)
Chloride: 106 mmol/L (ref 98–110)
Creat: 0.86 mg/dL (ref 0.60–0.88)
GFR, Est African American: 67 mL/min/{1.73_m2} (ref >=60)
GFR, Est Non African American: 57 mL/min/{1.73_m2} — ABNORMAL LOW (ref >=60)
Globulin: 1.9 g/dL (calc) (ref 1.9–3.7)
Glucose, Bld: 106 mg/dL — ABNORMAL HIGH (ref 65–99)
Potassium: 3.9 mmol/L (ref 3.5–5.3)
Sodium: 143 mmol/L (ref 135–146)
Total Bilirubin: 0.6 mg/dL (ref 0.2–1.2)
Total Protein: 6.1 g/dL (ref 6.1–8.1)

## 2021-02-13 LAB — TSH: TSH: 5.04 mIU/L — ABNORMAL HIGH (ref 0.40–4.50)

## 2021-02-14 ENCOUNTER — Encounter: Payer: Self-pay | Admitting: Nurse Practitioner

## 2021-02-14 ENCOUNTER — Non-Acute Institutional Stay: Payer: PPO | Admitting: Nurse Practitioner

## 2021-02-14 ENCOUNTER — Other Ambulatory Visit: Payer: Self-pay

## 2021-02-14 VITALS — BP 144/78 | HR 90 | Temp 99.2°F | Resp 16 | Wt 184.6 lb

## 2021-02-14 DIAGNOSIS — E782 Mixed hyperlipidemia: Secondary | ICD-10-CM | POA: Diagnosis not present

## 2021-02-14 DIAGNOSIS — I10 Essential (primary) hypertension: Secondary | ICD-10-CM

## 2021-02-14 DIAGNOSIS — M545 Low back pain, unspecified: Secondary | ICD-10-CM

## 2021-02-14 DIAGNOSIS — J309 Allergic rhinitis, unspecified: Secondary | ICD-10-CM

## 2021-02-14 DIAGNOSIS — F419 Anxiety disorder, unspecified: Secondary | ICD-10-CM | POA: Diagnosis not present

## 2021-02-14 DIAGNOSIS — R7303 Prediabetes: Secondary | ICD-10-CM | POA: Diagnosis not present

## 2021-02-14 DIAGNOSIS — R7989 Other specified abnormal findings of blood chemistry: Secondary | ICD-10-CM | POA: Diagnosis not present

## 2021-02-14 DIAGNOSIS — K219 Gastro-esophageal reflux disease without esophagitis: Secondary | ICD-10-CM | POA: Diagnosis not present

## 2021-02-14 NOTE — Assessment & Plan Note (Signed)
Hgb a1c 5.7 09/24/20

## 2021-02-14 NOTE — Assessment & Plan Note (Signed)
stable, on Fluticasone nasal spray qd, Claritin 10mg  qd.

## 2021-02-14 NOTE — Progress Notes (Signed)
Location:   clinic [place]   Place of Service:  Clinic (12) Provider: Marlana Latus NP  Code Status: DNR Goals of Care[place] 02[place]2021  Does Patient Have a Medical Advance Directive? Yes  Type of Advance Directive Living will;Out of facility DNR (pink MOST or yellow form)  Does patient want to make changes to medical advance directive? No - Patient declined  Copy of [place] in Chart? -  Pre-existing out of facility DNR order (yellow form or pink MOST form) Pink MOST form placed in chart (order not valid for inpatient use)     Chief Complaint  Patient presents with  . Medical Management of Chronic Issues    Blood pressure management    HPI: Patient is a 87 y.o. [name] seen today for medical management of chronic diseases.     Hx of lower back pain, chronic, taking Tylenol,Tramadol.  Allergic rhinitis, stable, on Fluticasone nasal spray qd, Claritin 10mg  qd.  Anxiety, Lorazepam prn available to her. TSH 5.04 02/12/21  Elevated TSH 5.04 02/12/21 HTN, takes  Losartan/HCT 100/12.5mg  qd, Metoprolol 25mg  qd since 02/07/21, takes ASA, Atorvastatin.Bun/creat 25/0.86 02/12/21 GERD, stable on Omeprazole 40mg  qd.Hgb 12.2 02/12/21 Hyperlipidemia, takes Atorvastatin, LDL 67 09/24/20 Prediabetes Hgb a1c 5.7 09/24/20 Past Medical History:  Diagnosis Date  . GERD (gastroesophageal reflux disease)   . Hx of cataract surgery    both eyes  . Hyperglycemia   . Hyperlipidemia   . Hypertension   . Lumbago 04/05/2016  . Macular degeneration    ? which eye(s)  . Obstructive sleep apnea   . Osteoporosis, senile   . Overactive bladder   . Squamous cell skin cancer 2/16   left lower leg    Past Surgical History:  Procedure Laterality Date  . ABDOMINAL HYSTERECTOMY  1960's  . APPENDECTOMY  1960's  . MOLE REMOVAL  2000   facial  . TONSILLECTOMY      Allergies  Allergen Reactions  .  Adhesive [Tape]   . Ampicillin   . Elemental Sulfur   . Nitrofuran Derivatives   . Zoloft [Sertraline Hcl] Other (See Comments)    Dizziness, nausea, faint feeling, and nightmares      Allergies as of 02/14/2021      Reactions   Adhesive [tape]    Ampicillin    Elemental Sulfur    Nitrofuran Derivatives    Zoloft [sertraline Hcl] Other (See Comments)   Dizziness, nausea, faint feeling, and nightmares        Medication List       Accurate as of February 14, 2021  2:05 PM. If you have any questions, ask your nurse or doctor.        acetaminophen 500 MG tablet Commonly known as: TYLENOL Take 500 mg by mouth daily.   aspirin EC 81 MG tablet Take 1 tablet (81 mg total) by mouth daily.   atorvastatin 20 MG tablet Commonly known as: LIPITOR TAKE 1 TABLET ONCE DAILY.   Calcium Carb-Cholecalciferol 600-800 MG-UNIT Tabs Take 1 tablet by mouth 2 (two) times daily.   Fish Oil 1200 MG Caps Take 1 capsule by mouth daily.   fluticasone 50 MCG/ACT nasal spray Commonly known as: FLONASE Place 1 spray into both nostrils daily. Each nostril   ICaps Caps Take 1 capsule by mouth daily.   loratadine 10 MG tablet Commonly known as: CLARITIN Take 10 mg by mouth daily.   LORazepam 0.5 MG tablet Commonly known as: ATIVAN Take 0.5 mg by  mouth daily as needed for anxiety.   losartan-hydrochlorothiazide 100-12.5 MG tablet Commonly known as: HYZAAR TAKE (1) TABLET DAILY FOR HIGH BLOOD PRESSURE.   metoprolol succinate 25 MG 24 hr tablet Commonly known as: TOPROL-XL Take 1 tablet (25 mg total) by mouth daily.   omeprazole 40 MG capsule Commonly known as: PRILOSEC TAKE 1 CAPSULE DAILY 1/2 HOUR BEFORE BREAKFAST FOR STOMACH ACID.   Systane 0.4-0.3 % Gel ophthalmic gel Generic drug: Polyethyl Glycol-Propyl Glycol Place 1 application into both eyes daily.   SYSTANE ULTRA OP Apply to eye in the morning, at noon, and at bedtime.   traMADol 50 MG tablet Commonly known as:  ULTRAM TAKE (1) TABLET EVERY SIX HOURS AS NEEDED FOR SEVERE PAIN.    - MAY MAKE DROWSY -       Review of Systems:  Review of Systems  Constitutional: Negative for fatigue, fever and unexpected weight change.  HENT: Positive for hearing loss. Negative for congestion.   Eyes: Negative for visual disturbance.  Respiratory: Negative for cough, chest tightness, shortness of breath and wheezing.   Cardiovascular: Negative for chest pain, palpitations and leg swelling.  Gastrointestinal: Negative for abdominal pain and constipation.  Genitourinary: Negative for difficulty urinating, dysuria and urgency.       0-1x/night urination, occasionally urinary leakage.   Musculoskeletal: Positive for arthralgias, back pain and gait problem.  Skin: Negative for color change.  Neurological: Negative for speech difficulty, weakness, light-headedness and headaches.       Tingling in toes sometimes, better after moves around  Psychiatric/Behavioral: Negative for confusion and sleep disturbance. The patient is not nervous/anxious.     Health Maintenance  Topic Date Due  . TETANUS/TDAP  12/14/2022  . INFLUENZA VACCINE  Completed  . DEXA SCAN  Completed  . COVID-19 Vaccine  Completed  . PNA vac Low Risk Adult  Completed    Physical Exam: Vitals:   02/14/21 1335  BP: (!) 144/78  Pulse: 90  Resp: 16  Temp: 99.2 F (37.3 C)  SpO2: 99%  Weight: 184 lb 9.6 oz (83.7 kg)   Body mass index is 29.8 kg/m. Physical Exam Vitals and nursing note reviewed.  Constitutional:      Appearance: Normal appearance. She is obese.  HENT:     Head: Normocephalic and atraumatic.     Nose: Nose normal.     Mouth/Throat:     Mouth: Mucous membranes are moist.  Eyes:     Extraocular Movements: Extraocular movements intact.     Conjunctiva/sclera: Conjunctivae normal.     Pupils: Pupils are equal, round, and reactive to light.  Cardiovascular:     Rate and Rhythm: Normal rate and regular rhythm.     Heart  sounds: No murmur heard.   Pulmonary:     Effort: Pulmonary effort is normal.     Breath sounds: No wheezing, rhonchi or rales.  Abdominal:     General: Bowel sounds are normal.     Palpations: Abdomen is soft.     Tenderness: There is no abdominal tenderness.  Musculoskeletal:     Cervical back: Normal range of motion and neck supple.     Right lower leg: No edema.     Left lower leg: No edema.  Skin:    General: Skin is warm and dry.  Neurological:     General: No focal deficit present.     Mental Status: She is alert and oriented to person, place, and time. Mental status is at baseline.  Cranial Nerves: No cranial nerve deficit.     Motor: No weakness.     Coordination: Coordination normal.     Gait: Gait abnormal.  Psychiatric:        Mood and Affect: Mood normal.        Behavior: Behavior normal.        Thought Content: Thought content normal.        Judgment: Judgment normal.     Labs reviewed: Basic Metabolic Panel: Recent Labs    09/24/20 0858 02/12/21 0700  NA 141 143  K 3.9 3.9  CL 105 106  CO2 28 28  GLUCOSE 147* 106*  BUN 25 25  CREATININE 0.93* 0.86  CALCIUM 9.6 9.6  TSH  --  5.04*   Liver Function Tests: Recent Labs    09/24/20 0858 02/12/21 0700  AST 22 20  ALT 14 16  BILITOT 0.6 0.6  PROT 6.0* 6.1   No results for input(s): LIPASE, AMYLASE in the last 8760 hours. No results for input(s): AMMONIA in the last 8760 hours. CBC: Recent Labs    09/24/20 0858 02/12/21 0700  WBC 6.8 8.2  NEUTROABS 3,794 4,936  HGB 12.7 12.2  HCT 38.1 37.1  MCV 95.0 93.7  PLT 188 191   Lipid Panel: Recent Labs    09/24/20 0858  CHOL 135  HDL 54  LDLCALC 67  TRIG 63  CHOLHDL 2.5   Lab Results  Component Value Date   HGBA1C 5.7 (H) 09/24/2020    Procedures since last visit: No results found.  Assessment/Plan  HTN (hypertension)  takes  Losartan/HCT 100/12.5mg  qd, Metoprolol 25mg  qd since 02/07/21, takes ASA, Atorvastatin.Bun/creat  25/0.86 02/12/21  Elevated TSH Elevated TSH 5.04 02/12/21, repeat TSH 12 weeks. Denied new fatigue, lethargy, feeling cold, constipation, irritability.   Anxiety  Lorazepam prn available to her. TSH 5.04 02/12/21  Allergic rhinitis stable, on Fluticasone nasal spray qd, Claritin 10mg  qd.   Lumbago chronic, taking Tylenol,Tramadol.    GERD (gastroesophageal reflux disease) stable on Omeprazole 40mg  qd.Hgb 12.2 02/12/21   Hyperlipidemia takes Atorvastatin, LDL 67 09/24/20   Prediabetes Hgb a1c 5.7 09/24/20   Labs/tests ordered:  TSH 12 weeks.  Next appt:  4 weeks

## 2021-02-14 NOTE — Assessment & Plan Note (Signed)
Lorazepam prn available to her. TSH 5.04 02/12/21

## 2021-02-14 NOTE — Assessment & Plan Note (Signed)
stable on Omeprazole 40mg qd.Hgb 12.2 02/12/21  

## 2021-02-14 NOTE — Assessment & Plan Note (Signed)
takes  Losartan/HCT 100/12.5mg  qd, Metoprolol 25mg  qd since 02/07/21, takes ASA, Atorvastatin.Bun/creat 25/0.86 02/12/21

## 2021-02-14 NOTE — Assessment & Plan Note (Signed)
chronic, taking Tylenol,Tramadol.

## 2021-02-14 NOTE — Assessment & Plan Note (Addendum)
Elevated TSH 5.04 02/12/21, repeat TSH 12 weeks. Denied new fatigue, lethargy, feeling cold, constipation, irritability.

## 2021-02-14 NOTE — Assessment & Plan Note (Signed)
takes Atorvastatin, LDL 67 09/24/20

## 2021-03-14 DIAGNOSIS — L814 Other melanin hyperpigmentation: Secondary | ICD-10-CM | POA: Diagnosis not present

## 2021-03-14 DIAGNOSIS — L821 Other seborrheic keratosis: Secondary | ICD-10-CM | POA: Diagnosis not present

## 2021-03-26 DIAGNOSIS — G4733 Obstructive sleep apnea (adult) (pediatric): Secondary | ICD-10-CM | POA: Diagnosis not present

## 2021-03-28 ENCOUNTER — Other Ambulatory Visit: Payer: Self-pay | Admitting: Nurse Practitioner

## 2021-04-01 ENCOUNTER — Telehealth: Payer: Self-pay

## 2021-04-01 DIAGNOSIS — N8110 Cystocele, unspecified: Secondary | ICD-10-CM | POA: Diagnosis not present

## 2021-04-01 DIAGNOSIS — N3281 Overactive bladder: Secondary | ICD-10-CM | POA: Diagnosis not present

## 2021-04-01 NOTE — Telephone Encounter (Signed)
Patient is requesting something stronger than the claritine and triamcinolone acetonide nasal spray Perrigo 55 mcg per spray 2 sprays each nostril (pharmacy substituted the fluticasone due to price)  She would like you to advise due to her heart condition.  [place] because the constant need to blow her nose She can be reached at [phone]  To [facility]

## 2021-04-02 NOTE — Telephone Encounter (Signed)
LMOM to return call to schedule in clinic visit with [facility].

## 2021-04-03 ENCOUNTER — Encounter: Payer: PPO | Admitting: Internal Medicine

## 2021-04-03 ENCOUNTER — Telehealth: Payer: Self-pay

## 2021-04-03 ENCOUNTER — Other Ambulatory Visit: Payer: Self-pay

## 2021-04-03 NOTE — Telephone Encounter (Signed)
Patient called back and stated that her allergy medication is not working. According to previous phone note recorded by Leafy Half, CMA patient was suppose to make appointment in clinic to see Man X Mast, NP.Patient an appointment to see Dr. Lyndel Safe in the [facility] clinic this afternoon at 1:30  Message routed to Man X, Mast, NP [facility]

## 2021-04-04 ENCOUNTER — Encounter: Payer: Self-pay | Admitting: Nurse Practitioner

## 2021-04-04 ENCOUNTER — Ambulatory Visit: Payer: PPO | Admitting: Nurse Practitioner

## 2021-04-04 ENCOUNTER — Telehealth: Payer: Self-pay

## 2021-04-04 ENCOUNTER — Ambulatory Visit: Payer: PPO | Admitting: Family

## 2021-04-04 ENCOUNTER — Other Ambulatory Visit: Payer: Self-pay

## 2021-04-04 DIAGNOSIS — R7303 Prediabetes: Secondary | ICD-10-CM | POA: Diagnosis not present

## 2021-04-04 DIAGNOSIS — M545 Low back pain, unspecified: Secondary | ICD-10-CM

## 2021-04-04 DIAGNOSIS — F419 Anxiety disorder, unspecified: Secondary | ICD-10-CM | POA: Diagnosis not present

## 2021-04-04 DIAGNOSIS — K219 Gastro-esophageal reflux disease without esophagitis: Secondary | ICD-10-CM | POA: Diagnosis not present

## 2021-04-04 DIAGNOSIS — R7989 Other specified abnormal findings of blood chemistry: Secondary | ICD-10-CM

## 2021-04-04 DIAGNOSIS — E782 Mixed hyperlipidemia: Secondary | ICD-10-CM

## 2021-04-04 DIAGNOSIS — I10 Essential (primary) hypertension: Secondary | ICD-10-CM

## 2021-04-04 DIAGNOSIS — J309 Allergic rhinitis, unspecified: Secondary | ICD-10-CM

## 2021-04-04 MED ORDER — IPRATROPIUM BROMIDE 0.03 % NA SOLN: 2.0000 | Freq: Two times a day (BID) | NASAL | 2 refills | Status: DC

## 2021-04-04 MED ORDER — TRAMADOL HCL 50 MG PO TABS
50.0000 mg | ORAL_TABLET | Freq: Four times a day (QID) | ORAL | 1 refills | Status: AC | PRN
Start: 2021-04-04 — End: 2021-04-11

## 2021-04-04 NOTE — Assessment & Plan Note (Addendum)
on Triamcinolone  nasal spray qd, Claritin 10mg  qd. Will add Atrovent nasal spray.

## 2021-04-04 NOTE — Assessment & Plan Note (Signed)
takes Atorvastatin, LDL 67 09/24/20

## 2021-04-04 NOTE — Telephone Encounter (Signed)
Per Jean [name],  The patient is calling back expecting to be seen today because according to her she was told clinic would be today even though she was scheduled to be seen by Dr. Lyndel Safe yesterday and was a no show. Patient was taken to [place] to find no one there. She is wondering can she go back to [place] and be seen today.  To [facility].

## 2021-04-04 NOTE — Assessment & Plan Note (Signed)
Prediabetes Hgb a1c 5.7 09/24/20

## 2021-04-04 NOTE — Telephone Encounter (Signed)
Patient was seen today in clinic

## 2021-04-04 NOTE — Assessment & Plan Note (Signed)
stable on Omeprazole 40mg  qd.Hgb 12.2 02/12/21

## 2021-04-04 NOTE — Progress Notes (Signed)
Location:   clinic [place]   Place of Service:    Provider: Marlana Latus NP  Code Status: DNR Goals of Care: [place] 04/04/2021  Does Patient Have a Medical Advance Directive? Yes  Type of Advance Directive Living will;Out of facility DNR (pink MOST or yellow form);Healthcare Power of Attorney  Does patient want to make changes to medical advance directive? No - Patient declined  Copy of [place] in Chart? Yes - validated most recent copy scanned in chart (See row information)  Pre-existing out of facility DNR order (yellow form or pink MOST form) -     Chief Complaint  Patient presents with  . Acute Visit    Patient states allergy medicine not working.    HPI: Patient is a 87 y.o. female seen today for medical management of chronic diseases.    Hx of lower back pain, chronic, taking Tylenol,Tramadol.  Allergic rhinitis, on Fluticasone nasal spray qd, Claritin 10mg  qd.  Anxiety, Lorazepam prn available to her. TSH 5.04 02/12/21             Elevated TSH 5.04 02/12/21 HTN,takesLosartan/HCT 100/12.5mg  qd, Metoprolol 25mg  qd since 02/07/21, takes ASA, Atorvastatin.Bun/creat 25/0.86 02/12/21 GERD, stable on Omeprazole 40mg  qd.Hgb 12.2 02/12/21 Hyperlipidemia, takes Atorvastatin, LDL 67 09/24/20 Prediabetes Hgb a1c 5.7 09/24/20   Past Medical History:  Diagnosis Date  . GERD (gastroesophageal reflux disease)   . Hx of cataract surgery    both eyes  . Hyperglycemia   . Hyperlipidemia   . Hypertension   . Lumbago 04/05/2016  . Macular degeneration    ? which eye(s)  . Obstructive sleep apnea   . Osteoporosis, senile   . Overactive bladder   . Squamous cell skin cancer 2/16   left lower leg    Past Surgical History:  Procedure Laterality Date  . ABDOMINAL HYSTERECTOMY  1960's  . APPENDECTOMY  1960's  . MOLE REMOVAL  2000   facial  . TONSILLECTOMY      Allergies   Allergen Reactions  . Adhesive [Tape]   . Ampicillin   . Elemental Sulfur   . Nitrofuran Derivatives   . Zoloft [Sertraline Hcl] Other (See Comments)    Dizziness, nausea, faint feeling, and nightmares      Allergies as of 04/04/2021      Reactions   Adhesive [tape]    Ampicillin    Elemental Sulfur    Nitrofuran Derivatives    Zoloft [sertraline Hcl] Other (See Comments)   Dizziness, nausea, faint feeling, and nightmares        Medication List       Accurate as of April 04, 2021 11:59 PM. If you have any questions, ask your nurse or doctor.        acetaminophen 500 MG tablet Commonly known as: TYLENOL Take 500 mg by mouth daily.   aspirin EC 81 MG tablet Take 1 tablet (81 mg total) by mouth daily.   atorvastatin 20 MG tablet Commonly known as: LIPITOR TAKE 1 TABLET ONCE DAILY.   Calcium Carb-Cholecalciferol 600-800 MG-UNIT Tabs Take 1 tablet by mouth 2 (two) times daily.   Fish Oil 1200 MG Caps Take 1 capsule by mouth daily.   fluticasone 50 MCG/ACT nasal spray Commonly known as: FLONASE Place 1 spray into both nostrils daily. Each nostril   ICaps Caps Take 1 capsule by mouth daily.   ipratropium 0.03 % nasal spray Commonly known as: ATROVENT Place 2 sprays into both nostrils every 12 (twelve)  hours. Started by: Arturo Sofranko X Corvette Orser, NP   loratadine 10 MG tablet Commonly known as: CLARITIN Take 10 mg by mouth daily.   LORazepam 0.5 MG tablet Commonly known as: ATIVAN Take 0.5 mg by mouth daily as needed for anxiety.   losartan-hydrochlorothiazide 100-12.5 MG tablet Commonly known as: HYZAAR TAKE (1) TABLET DAILY FOR HIGH BLOOD PRESSURE.   metoprolol succinate 25 MG 24 hr tablet Commonly known as: TOPROL-XL Take 1 tablet (25 mg total) by mouth daily.   omeprazole 40 MG capsule Commonly known as: PRILOSEC TAKE 1 CAPSULE DAILY 1/2 HOUR BEFORE BREAKFAST FOR STOMACH ACID.   Systane 0.4-0.3 % Gel ophthalmic gel Generic drug: Polyethyl Glycol-Propyl  Glycol Place 1 application into both eyes daily.   SYSTANE ULTRA OP Apply to eye in the morning, at noon, and at bedtime.   traMADol 50 MG tablet Commonly known as: ULTRAM Take 1 tablet (50 mg total) by mouth every 6 (six) hours as needed for up to 7 days. What changed: See the new instructions. Changed by: Sejal Cofield X Zaki Gertsch, NP   TRIAMCINOLONE ACETONIDE(NASAL) NA Place 2 sprays into the nose. Each nostril       Review of Systems:  Review of Systems  Constitutional: Negative for fatigue, fever and unexpected weight change.  HENT: Positive for congestion, hearing loss, postnasal drip and rhinorrhea. Negative for sinus pressure, sinus pain, sneezing, sore throat, trouble swallowing and voice change.   Eyes: Negative for visual disturbance.  Respiratory: Negative for cough, shortness of breath and wheezing.   Cardiovascular: Negative for leg swelling.  Gastrointestinal: Negative for abdominal pain and constipation.  Genitourinary: Negative for difficulty urinating, dysuria and urgency.       0-1x/night urination, occasionally urinary leakage.   Musculoskeletal: Positive for arthralgias, back pain and gait problem.  Skin: Negative for color change.  Neurological: Negative for speech difficulty, weakness and light-headedness.       Tingling in toes sometimes, better after moves around  Psychiatric/Behavioral: Negative for confusion and sleep disturbance. The patient is not nervous/anxious.     Health Maintenance  Topic Date Due  . INFLUENZA VACCINE  07/29/2021  . TETANUS/TDAP  12/14/2022  . DEXA SCAN  Completed  . COVID-19 Vaccine  Completed  . PNA vac Low Risk Adult  Completed  . HPV VACCINES  Aged Out    Physical Exam: Vitals:   04/04/21 1455  BP: 120/62  Pulse: 78  Resp: 18  Temp: (!) 97.3 F (36.3 C)  SpO2: 94%  Weight: 178 lb 12.8 oz (81.1 kg)  Height: 5\' 6"  (1.676 m)   Body mass index is 28.86 kg/m. Physical Exam Vitals and nursing note reviewed.   Constitutional:      Appearance: Normal appearance. She is obese.  HENT:     Head: Normocephalic and atraumatic.     Nose: Congestion and rhinorrhea present.     Mouth/Throat:     Mouth: Mucous membranes are moist.     Pharynx: No oropharyngeal exudate or posterior oropharyngeal erythema.  Eyes:     Extraocular Movements: Extraocular movements intact.     Conjunctiva/sclera: Conjunctivae normal.     Pupils: Pupils are equal, round, and reactive to light.  Cardiovascular:     Rate and Rhythm: Normal rate and regular rhythm.     Heart sounds: No murmur heard.   Pulmonary:     Effort: Pulmonary effort is normal. No respiratory distress.     Breath sounds: No wheezing, rhonchi or rales.  Chest:  Chest wall: No tenderness.  Abdominal:     General: Bowel sounds are normal.     Palpations: Abdomen is soft.     Tenderness: There is no abdominal tenderness.  Musculoskeletal:     Cervical back: Normal range of motion and neck supple.     Right lower leg: No edema.     Left lower leg: No edema.  Skin:    General: Skin is warm and dry.  Neurological:     General: No focal deficit present.     Mental Status: She is alert and oriented to person, place, and time. Mental status is at baseline.     Cranial Nerves: No cranial nerve deficit.     Motor: No weakness.     Coordination: Coordination normal.     Gait: Gait abnormal.  Psychiatric:        Mood and Affect: Mood normal.        Behavior: Behavior normal.        Thought Content: Thought content normal.        Judgment: Judgment normal.     Labs reviewed: Basic Metabolic Panel: Recent Labs    09/24/20 0858 02/12/21 0700  NA 141 143  K 3.9 3.9  CL 105 106  CO2 28 28  GLUCOSE 147* 106*  BUN 25 25  CREATININE 0.93* 0.86  CALCIUM 9.6 9.6  TSH  --  5.04*   Liver Function Tests: Recent Labs    09/24/20 0858 02/12/21 0700  AST 22 20  ALT 14 16  BILITOT 0.6 0.6  PROT 6.0* 6.1   No results for input(s): LIPASE,  AMYLASE in the last 8760 hours. No results for input(s): AMMONIA in the last 8760 hours. CBC: Recent Labs    09/24/20 0858 02/12/21 0700  WBC 6.8 8.2  NEUTROABS 3,794 4,936  HGB 12.7 12.2  HCT 38.1 37.1  MCV 95.0 93.7  PLT 188 191   Lipid Panel: Recent Labs    09/24/20 0858  CHOL 135  HDL 54  LDLCALC 67  TRIG 63  CHOLHDL 2.5   Lab Results  Component Value Date   HGBA1C 5.7 (H) 09/24/2020    Procedures since last visit: No results found.  Assessment/Plan  Allergic rhinitis on Triamcinolone  nasal spray qd, Claritin 10mg  qd. Will add Atrovent nasal spray.    Anxiety Lorazepam prn available to her. TSH 5.04 02/12/21   Elevated TSH  Elevated TSH 5.04 02/12/21, asymptomatic, pending 12 wks f/u TSH   HTN (hypertension) Blood pressure is controlled, 120/62 today, continue Losartan/HCT 100/12.5mg  qd, decrease Metoprolol 12.5mg  qd since 02/07/21, takes ASA, Atorvastatin.Bun/creat 25/0.86 02/12/21   GERD (gastroesophageal reflux disease) stable on Omeprazole 40mg  qd.Hgb 12.2 02/12/21   Hyperlipidemia takes Atorvastatin, LDL 67 09/24/20   Prediabetes Prediabetes Hgb a1c 5.7 09/24/20   Lumbago Hx of lower back pain, chronic, taking Tylenol,Tramadol.    Labs/tests ordered:  None  Next appt:  05/30/2021

## 2021-04-04 NOTE — Assessment & Plan Note (Signed)
Hx of lower back pain, chronic, taking Tylenol,Tramadol.

## 2021-04-04 NOTE — Assessment & Plan Note (Addendum)
Blood pressure is controlled, 120/62 today, continue Losartan/HCT 100/12.5mg  qd, decrease Metoprolol 12.5mg  qd since 02/07/21, takes ASA, Atorvastatin.Bun/creat 25/0.86 02/12/21

## 2021-04-04 NOTE — Assessment & Plan Note (Signed)
Elevated TSH 5.04 02/12/21, asymptomatic, pending 12 wks f/u TSH

## 2021-04-04 NOTE — Assessment & Plan Note (Signed)
Lorazepam prn available to her. TSH 5.04 02/12/21

## 2021-04-05 ENCOUNTER — Encounter: Payer: Self-pay | Admitting: Nurse Practitioner

## 2021-04-09 ENCOUNTER — Other Ambulatory Visit: Payer: Self-pay | Admitting: Internal Medicine

## 2021-05-12 ENCOUNTER — Other Ambulatory Visit: Payer: Self-pay | Admitting: Nurse Practitioner

## 2021-05-30 ENCOUNTER — Non-Acute Institutional Stay: Payer: PPO | Admitting: Nurse Practitioner

## 2021-05-30 ENCOUNTER — Other Ambulatory Visit: Payer: Self-pay

## 2021-05-30 ENCOUNTER — Encounter: Payer: Self-pay | Admitting: Nurse Practitioner

## 2021-05-30 DIAGNOSIS — M545 Low back pain, unspecified: Secondary | ICD-10-CM | POA: Diagnosis not present

## 2021-05-30 DIAGNOSIS — J309 Allergic rhinitis, unspecified: Secondary | ICD-10-CM | POA: Diagnosis not present

## 2021-05-30 DIAGNOSIS — R7303 Prediabetes: Secondary | ICD-10-CM

## 2021-05-30 DIAGNOSIS — K219 Gastro-esophageal reflux disease without esophagitis: Secondary | ICD-10-CM

## 2021-05-30 DIAGNOSIS — F419 Anxiety disorder, unspecified: Secondary | ICD-10-CM | POA: Diagnosis not present

## 2021-05-30 DIAGNOSIS — R7989 Other specified abnormal findings of blood chemistry: Secondary | ICD-10-CM

## 2021-05-30 DIAGNOSIS — E782 Mixed hyperlipidemia: Secondary | ICD-10-CM

## 2021-05-30 DIAGNOSIS — I10 Essential (primary) hypertension: Secondary | ICD-10-CM | POA: Diagnosis not present

## 2021-05-30 NOTE — Assessment & Plan Note (Signed)
takes Atorvastatin, LDL 67 09/24/20

## 2021-05-30 NOTE — Progress Notes (Signed)
Location:   clinic [place]   Place of Service:  Clinic (12) Provider: Marlana Latus NP  Code Status: DNR Goals of Care[place] 05/30/2021  Does Patient Have a Medical Advance Directive? Yes  Type of Par[place]ic of [place];Living will;Out of facility DNR (pink MOST or yellow form)  Does patient want to make changes to medical advance directive? No - Patient declined  Copy of [place] in Chart? Yes - validated most recent copy scanned in chart (See row information)  Pre-existing out of facility DNR order (yellow form or pink MOST form) Pink MOST form placed in chart (order not valid for inpatient use)     Chief Complaint  Patient presents with  . Medical Management of Chronic Issues    3 month follow up.   [facility] Health Maintenance    Discuss need for shingles vaccine    HPI: Patient is a 87 y.o. female seen today for medical management of chronic diseases.       Hx of lower back pain, chronic, taking Tylenol,Tramadol.  Allergic rhinitis, off Fluticasone nasal spray qd, on Claritin 10mg  qd, Atrovent nasal spray.  Anxiety, Lorazepam prn available to her.TSH 5.04 02/12/21 Elevated TSH 5.04 02/12/21 HTN,takesLosartan/HCT 100/12.5mg  qd,Metoprolol 25mg  qd since 02/07/21, takesASA, Atorvastatin.Bun/creat25/0.86 02/12/21 GERD, stable on Omeprazole 40mg  qd.Hgb12.2 02/12/21 Hyperlipidemia, takes Atorvastatin, LDL 67 09/24/20 Prediabetes Hgb a1c 5.7 09/24/20   Past Medical History:  Diagnosis Date  . GERD (gastroesophageal reflux disease)   . Hx of cataract surgery    both eyes  . Hyperglycemia   . Hyperlipidemia   . Hypertension   . Lumbago 04/05/2016  . Macular degeneration    ? which eye(s)  . Obstructive sleep apnea   . Osteoporosis, senile   . Overactive bladder   . Squamous cell skin cancer 2/16   left lower leg    Past Surgical  History:  Procedure Laterality Date  . ABDOMINAL HYSTERECTOMY  1960's  . APPENDECTOMY  1960's  . MOLE REMOVAL  2000   facial  . TONSILLECTOMY      Allergies  Allergen Reactions  . Adhesive [Tape]   . Ampicillin   . Elemental Sulfur   . Nitrofuran Derivatives   . Zoloft [Sertraline Hcl] Other (See Comments)    Dizziness, nausea, faint feeling, and nightmares      Allergies as of 05/30/2021      Reactions   Adhesive [tape]    Ampicillin    Elemental Sulfur    Nitrofuran Derivatives    Zoloft [sertraline Hcl] Other (See Comments)   Dizziness, nausea, faint feeling, and nightmares        Medication List       Accurate as of May 30, 2021 11:59 PM. If you have any questions, ask your nurse or doctor.        STOP taking these medications   Fish Oil 1200 MG Caps Stopped by: Jahmeek Shirk X Arlon Bleier, NP   ICaps Caps Stopped by: Dianne Bady X Brinden Kincheloe, NP     TAKE these medications   acetaminophen 500 MG tablet Commonly known as: TYLENOL Take 500 mg by mouth daily.   aspirin EC 81 MG tablet Take 1 tablet (81 mg total) by mouth daily.   atorvastatin 20 MG tablet Commonly known as: LIPITOR TAKE 1 TABLET ONCE DAILY.   Calcium Carb-Cholecalciferol 600-800 MG-UNIT Tabs Take 1 tablet by mouth 2 (two) times daily.   fluticasone 50 MCG/ACT nasal spray Commonly known as: [place]  1 spray into both nostrils daily. Each nostril   ipratropium 0.03 % nasal spray Commonly known as: ATROVENT Place 2 sprays into both nostrils every 12 (twelve) hours.   loratadine 10 MG tablet Commonly known as: CLARITIN Take 10 mg by mouth daily.   LORazepam 0.5 MG tablet Commonly known as: ATIVAN Take 0.5 mg by mouth daily as needed for anxiety.   losartan-hydrochlorothiazide 100-12.5 MG tablet Commonly known as: HYZAAR TAKE (1) TABLET DAILY FOR HIGH BLOOD PRESSURE.   metoprolol succinate 25 MG 24 hr tablet Commonly known as: TOPROL-XL Take 1 tablet (25 mg total) by mouth daily.   omeprazole 40 MG  capsule Commonly known as: PRILOSEC TAKE 1 CAPSULE DAILY 1/2 HOUR BEFORE BREAKFAST FOR STOMACH ACID.   Systane 0.4-0.3 % Gel ophthalmic gel Generic drug: Polyethyl Glycol-Propyl Glycol Place 1 application into both eyes daily.   SYSTANE ULTRA OP Apply to eye in the morning, at noon, and at bedtime.   traMADol 50 MG tablet Commonly known as: ULTRAM Take 50 mg by mouth as needed.   TRIAMCINOLONE ACETONIDE(NASAL) NA Place 2 sprays into the nose. Each nostril       Review of Systems:  Review of Systems  Constitutional: Positive for unexpected weight change. Negative for fatigue and fever.       Gradual weight loss-expected, diet  HENT: Negative for congestion, hearing loss, postnasal drip, rhinorrhea, sinus pressure, sinus pain, sneezing, sore throat, trouble swallowing and voice change.        Improved.   Eyes: Negative for visual disturbance.  Respiratory: Negative for cough, shortness of breath and wheezing.   Cardiovascular: Negative for leg swelling.  Gastrointestinal: Positive for diarrhea. Negative for abdominal pain and constipation.       X1-2 times in the past 3 months, diet adjustment helped.   Genitourinary: Negative for difficulty urinating, dysuria and urgency.       0-1x/night urination, occasionally urinary leakage.   Musculoskeletal: Positive for arthralgias, back pain and gait problem.  Skin: Negative for color change.  Neurological: Negative for speech difficulty, weakness and light-headedness.       Tingling in toes sometimes, better after moves around  Psychiatric/Behavioral: Negative for confusion and sleep disturbance. The patient is not nervous/anxious.     Health Maintenance  Topic Date Due  . Zoster Vaccines- Shingrix (1 of 2) Never done  . INFLUENZA VACCINE  07/29/2021  . TETANUS/TDAP  12/14/2022  . DEXA SCAN  Completed  . COVID-19 Vaccine  Completed  . PNA vac Low Risk Adult  Completed  . Pneumococcal Vaccine 19-42 Years old  Aged Out  . HPV  VACCINES  Aged Out    Physical Exam: Vitals:   05/30/21 1335  BP: (!) 148/78  Pulse: 76  Resp: 20  Temp: 97.8 F (36.6 C)  SpO2: 97%  Weight: 175 lb 12.8 oz (79.7 kg)  Height: 5\' 6"  (1.676 m)   Body mass index is 28.37 kg/m. Physical Exam Vitals and nursing note reviewed.  Constitutional:      Appearance: Normal appearance. She is obese.  HENT:     Head: Normocephalic and atraumatic.     Nose: Nose normal. No congestion or rhinorrhea.     Mouth/Throat:     Mouth: Mucous membranes are moist.     Pharynx: No oropharyngeal exudate or posterior oropharyngeal erythema.  Eyes:     Extraocular Movements: Extraocular movements intact.     Conjunctiva/sclera: Conjunctivae normal.     Pupils: Pupils are equal, round, and reactive  to light.  Cardiovascular:     Rate and Rhythm: Normal rate and regular rhythm.     Heart sounds: No murmur heard.   Pulmonary:     Effort: Pulmonary effort is normal. No respiratory distress.     Breath sounds: No wheezing, rhonchi or rales.  Chest:     Chest wall: No tenderness.  Abdominal:     General: Bowel sounds are normal.     Palpations: Abdomen is soft.     Tenderness: There is no abdominal tenderness.  Musculoskeletal:     Cervical back: Normal range of motion and neck supple.     Right lower leg: No edema.     Left lower leg: No edema.  Skin:    General: Skin is warm and dry.  Neurological:     General: No focal deficit present.     Mental Status: She is alert and oriented to person, place, and time. Mental status is at baseline.     Cranial Nerves: No cranial nerve deficit.     Motor: No weakness.     Coordination: Coordination normal.     Gait: Gait abnormal.  Psychiatric:        Mood and Affect: Mood normal.        Behavior: Behavior normal.        Thought Content: Thought content normal.        Judgment: Judgment normal.     Labs reviewed: Basic Metabolic Panel: Recent Labs    09/24/20 0858 02/12/21 0700  NA 141  143  K 3.9 3.9  CL 105 106  CO2 28 28  GLUCOSE 147* 106*  BUN 25 25  CREATININE 0.93* 0.86  CALCIUM 9.6 9.6  TSH  --  5.04*   Liver Function Tests: Recent Labs    09/24/20 0858 02/12/21 0700  AST 22 20  ALT 14 16  BILITOT 0.6 0.6  PROT 6.0* 6.1   No results for input(s): LIPASE, AMYLASE in the last 8760 hours. No results for input(s): AMMONIA in the last 8760 hours. CBC: Recent Labs    09/24/20 0858 02/12/21 0700  WBC 6.8 8.2  NEUTROABS 3,794 4,936  HGB 12.7 12.2  HCT 38.1 37.1  MCV 95.0 93.7  PLT 188 191   Lipid Panel: Recent Labs    09/24/20 0858  CHOL 135  HDL 54  LDLCALC 67  TRIG 63  CHOLHDL 2.5   Lab Results  Component Value Date   HGBA1C 5.7 (H) 09/24/2020    Procedures since last visit: No results found.  Assessment/Plan  Elevated TSH TSH 5.04 02/12/21, update TSH    Prediabetes Hgb a1c 5.7 09/24/20, update Hgb a1c.    Hyperlipidemia takes Atorvastatin, LDL 67 09/24/20   GERD (gastroesophageal reflux disease) stable on Omeprazole 40mg  qd.Hgb12.2 02/12/21   HTN (hypertension) Blood pressure is controlled, takesLosartan/HCT 100/12.5mg  qd,Metoprolol 25mg  qd since 02/07/21, takesASA, Atorvastatin.Bun/creat25/0.86 02/12/21   Anxiety Lorazepam prn available to her.TSH 5.04 02/12/21  Allergic rhinitis Stable, continue Claritin 10mg  qd, Atrovent nasal spray.   Lumbago Hx of lower back pain, chronic, taking Tylenol,Tramadol.    Labs/tests ordered:  TSH, Hgb a1c one week  Next appt:  3 months

## 2021-05-30 NOTE — Assessment & Plan Note (Signed)
Hx of lower back pain, chronic, taking Tylenol,Tramadol.

## 2021-05-30 NOTE — Assessment & Plan Note (Signed)
Blood pressure is controlled, takesLosartan/HCT 100/12.5mg  qd,Metoprolol 25mg  qd since 02/07/21, takesASA, Atorvastatin.Bun/creat25/0.86 02/12/21

## 2021-05-30 NOTE — Assessment & Plan Note (Signed)
Hgb a1c 5.7 09/24/20, update Hgb a1c.

## 2021-05-30 NOTE — Assessment & Plan Note (Signed)
stable on Omeprazole 40mg  qd.Hgb12.2 02/12/21

## 2021-05-30 NOTE — Assessment & Plan Note (Signed)
TSH 5.04 02/12/21, update TSH

## 2021-05-30 NOTE — Assessment & Plan Note (Addendum)
Stable, continue Claritin 10mg  qd, Atrovent nasal spray.

## 2021-05-30 NOTE — Assessment & Plan Note (Signed)
Lorazepam prn available to her.TSH 5.04 02/12/21

## 2021-05-31 ENCOUNTER — Encounter: Payer: Self-pay | Admitting: Nurse Practitioner

## 2021-05-31 ENCOUNTER — Encounter: Payer: Self-pay | Admitting: Internal Medicine

## 2021-06-03 ENCOUNTER — Emergency Department (HOSPITAL_COMMUNITY): Payer: PPO

## 2021-06-03 ENCOUNTER — Encounter (HOSPITAL_COMMUNITY): Payer: Self-pay

## 2021-06-03 ENCOUNTER — Other Ambulatory Visit: Payer: Self-pay

## 2021-06-03 ENCOUNTER — Emergency Department (HOSPITAL_COMMUNITY)
Admission: EM | Admit: 2021-06-03 | Discharge: 2021-06-04 | Disposition: A | Discharge status: Skilled Nursing Facility | Payer: PPO | Attending: Emergency Medicine | Admitting: Emergency Medicine

## 2021-06-03 DIAGNOSIS — I10 Essential (primary) hypertension: Secondary | ICD-10-CM | POA: Diagnosis not present

## 2021-06-03 DIAGNOSIS — W010XXA Fall on same level from slipping, tripping and stumbling without subsequent striking against object, initial encounter: Secondary | ICD-10-CM | POA: Insufficient documentation

## 2021-06-03 DIAGNOSIS — Z79899 Other long term (current) drug therapy: Secondary | ICD-10-CM | POA: Insufficient documentation

## 2021-06-03 DIAGNOSIS — S3993XA Unspecified injury of pelvis, initial encounter: Secondary | ICD-10-CM | POA: Diagnosis not present

## 2021-06-03 DIAGNOSIS — Z85828 Personal history of other malignant neoplasm of skin: Secondary | ICD-10-CM | POA: Insufficient documentation

## 2021-06-03 DIAGNOSIS — W19XXXA Unspecified fall, initial encounter: Secondary | ICD-10-CM

## 2021-06-03 DIAGNOSIS — I2510 Atherosclerotic heart disease of native coronary artery without angina pectoris: Secondary | ICD-10-CM | POA: Diagnosis not present

## 2021-06-03 DIAGNOSIS — Y9301 Activity, walking, marching and hiking: Secondary | ICD-10-CM | POA: Insufficient documentation

## 2021-06-03 DIAGNOSIS — Z7982 Long term (current) use of aspirin: Secondary | ICD-10-CM | POA: Insufficient documentation

## 2021-06-03 DIAGNOSIS — K5750 Diverticulosis of both small and large intestine without perforation or abscess without bleeding: Secondary | ICD-10-CM | POA: Diagnosis not present

## 2021-06-03 DIAGNOSIS — S300XXA Contusion of lower back and pelvis, initial encounter: Secondary | ICD-10-CM | POA: Diagnosis not present

## 2021-06-03 DIAGNOSIS — M47816 Spondylosis without myelopathy or radiculopathy, lumbar region: Secondary | ICD-10-CM | POA: Diagnosis not present

## 2021-06-03 DIAGNOSIS — M4186 Other forms of scoliosis, lumbar region: Secondary | ICD-10-CM | POA: Diagnosis not present

## 2021-06-03 DIAGNOSIS — S39002A Unspecified injury of muscle, fascia and tendon of lower back, initial encounter: Secondary | ICD-10-CM | POA: Diagnosis present

## 2021-06-03 DIAGNOSIS — S20229A Contusion of unspecified back wall of thorax, initial encounter: Secondary | ICD-10-CM | POA: Diagnosis not present

## 2021-06-03 DIAGNOSIS — I700 Atherosclerosis of aorta: Secondary | ICD-10-CM | POA: Diagnosis not present

## 2021-06-03 DIAGNOSIS — K449 Diaphragmatic hernia without obstruction or gangrene: Secondary | ICD-10-CM | POA: Diagnosis not present

## 2021-06-03 DIAGNOSIS — M545 Low back pain, unspecified: Secondary | ICD-10-CM | POA: Diagnosis not present

## 2021-06-03 DIAGNOSIS — M4184 Other forms of scoliosis, thoracic region: Secondary | ICD-10-CM | POA: Diagnosis not present

## 2021-06-03 DIAGNOSIS — G319 Degenerative disease of nervous system, unspecified: Secondary | ICD-10-CM | POA: Diagnosis not present

## 2021-06-03 DIAGNOSIS — M47812 Spondylosis without myelopathy or radiculopathy, cervical region: Secondary | ICD-10-CM | POA: Diagnosis not present

## 2021-06-03 DIAGNOSIS — M549 Dorsalgia, unspecified: Secondary | ICD-10-CM | POA: Diagnosis not present

## 2021-06-03 DIAGNOSIS — M47814 Spondylosis without myelopathy or radiculopathy, thoracic region: Secondary | ICD-10-CM | POA: Diagnosis not present

## 2021-06-03 DIAGNOSIS — M4312 Spondylolisthesis, cervical region: Secondary | ICD-10-CM | POA: Diagnosis not present

## 2021-06-03 DIAGNOSIS — R930 Abnormal findings on diagnostic imaging of skull and head, not elsewhere classified: Secondary | ICD-10-CM | POA: Diagnosis not present

## 2021-06-03 DIAGNOSIS — Z043 Encounter for examination and observation following other accident: Secondary | ICD-10-CM | POA: Diagnosis not present

## 2021-06-03 LAB — BASIC METABOLIC PANEL
Anion gap: 8 (ref 5–15)
BUN: 27 mg/dL — ABNORMAL HIGH (ref 8–23)
CO2: 28 mmol/L (ref 22–32)
Calcium: 9.9 mg/dL (ref 8.9–10.3)
Chloride: 105 mmol/L (ref 98–111)
Creatinine, Ser: 0.72 mg/dL (ref 0.44–1.00)
GFR, Estimated: >60 mL/min (ref >60)
Glucose, Bld: 93 mg/dL (ref 70–99)
Potassium: 3.4 mmol/L — ABNORMAL LOW (ref 3.5–5.1)
Sodium: 141 mmol/L (ref 135–145)

## 2021-06-03 LAB — CBC
HCT: 36.8 % (ref 36.0–46.0)
Hemoglobin: 11.9 g/dL — ABNORMAL LOW (ref 12.0–15.0)
MCH: 31 pg (ref 26.0–34.0)
MCHC: 32.3 g/dL (ref 30.0–36.0)
MCV: 95.8 fL (ref 80.0–100.0)
Platelets: 174 10*3/uL (ref 150–400)
RBC: 3.84 MIL/uL — ABNORMAL LOW (ref 3.87–5.11)
RDW: 15.4 % (ref 11.5–15.5)
WBC: 11.5 10*3/uL — ABNORMAL HIGH (ref 4.0–10.5)
nRBC: 0 % (ref 0.0–0.2)

## 2021-06-03 MED ORDER — ACETAMINOPHEN 325 MG PO TABS: 650.0000 mg | ORAL_TABLET | Freq: Once | ORAL | Status: DC

## 2021-06-03 MED ORDER — MORPHINE SULFATE (PF) 2 MG/ML IV SOLN
2.0000 mg | Freq: Once | INTRAVENOUS | Status: AC
  Administered 2021-06-03: 2 mg via INTRAVENOUS
  Filled 2021-06-03: qty 1

## 2021-06-03 NOTE — ED Notes (Signed)
[facility] contacted, transport arranged for patient.

## 2021-06-03 NOTE — ED Provider Notes (Signed)
[facility] DEPT Provider Note   CSN: [id number] Arrival date & time: 06/03/21  1506     History Chief Complaint  Patient presents with  . Fall    Jean [name] is a 87 y.o. female.  HPI   Patient presented to the ED for evaluation after a fall.  Patient lives at an independent living facility.  Patient states she was walking across the room and she tripped over the comforter that was hanging off the bed.  Patient ended up falling to the ground and she was unable to get up because she was having pain in her back.  Patient states she was lying on the floor all night long.  She did not lose consciousness.  She denies taking any blood thinners.  Patient is having pain in her mid to lower back that increases with any movement.  The pain is sharp.  She is not having any pain in her legs or her hips.  No pain in her shoulder.  No difficulty breathing.  Past Medical History:  Diagnosis Date  . GERD (gastroesophageal reflux disease)   . Hx of cataract surgery    both eyes  . Hyperglycemia   . Hyperlipidemia   . Hypertension   . Lumbago 04/05/2016  . Macular degeneration    ? which eye(s)  . Obstructive sleep apnea   . Osteoporosis, senile   . Overactive bladder   . Squamous cell skin cancer 2/16   left lower leg    Patient Active Problem List   Diagnosis Date Noted  . Elevated TSH 02/14/2021  . Prediabetes 01/31/2021  . Advanced care planning/counseling discussion 02/02/2020  . DOE (dyspnea on exertion) 08/25/2019  . Brittle nails 08/25/2019  . Hot flashes 06/09/2019  . Cervical neuropathic pain 12/02/2018  . Bone spur of right foot 06/03/2018  . Allergic rhinitis 11/03/2017  . Iron deficiency anemia 11/03/2017  . Obesity (BMI 30-39.9) 11/03/2017  . Anxiety 08/11/2017  . Edema 12/11/2016  . Constipation 07/30/2016  . HOH (hard of hearing) 07/03/2016  . Fatigue 04/17/2016  . Lumbago 04/05/2016  . Obstructive sleep apnea 04/05/2016  .  Hyperlipidemia   . HTN (hypertension) 12/04/2015  . 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 GERD (gastroesophageal reflux disease) 12/04/2015  . OAB (overactive bladder) 12/04/2015  . Abnormality of gait 12/04/2015    Past Surgical History:  Procedure Laterality Date  . ABDOMINAL HYSTERECTOMY  1960's  . APPENDECTOMY  1960's  . MOLE REMOVAL  2000   facial  . TONSILLECTOMY       OB History   No obstetric history on file.     Family History  Problem Relation Age of Onset  . Heart disease Mother   . Heart disease Father     Social History   Tobacco Use  . Smoking status: Never Smoker  . Smokeless tobacco: Never Used  Vaping Use  . Vaping Use: Never used  Substance Use Topics  . Alcohol use: Not Currently    Comment: occassionally 1/2 glass of wine 1-2 x month  . Drug use: No    Home Medications Prior to Admission medications   Medication Sig Start Date End Date Taking? Authorizing Provider  acetaminophen (TYLENOL) 500 MG tablet Take 500 mg by mouth daily.     [provider]  aspirin EC 81 MG tablet Take 1 tablet (81 mg total) by mouth daily. 07/07/17   Blanchie Serve, MD  atorvastatin (LIPITOR) 20 MG tablet TAKE 1 TABLET ONCE  DAILY. 05/13/21   Mast, Man X, NP  Calcium Carb-Cholecalciferol 600-800 MG-UNIT TABS Take 1 tablet by mouth 2 (two) times daily.     [provider]  fluticasone (FLONASE) 50 MCG/ACT nasal spray Place 1 spray into both nostrils daily. Each nostril    [provider]  ipratropium (ATROVENT) 0.03 % nasal spray Place 2 sprays into both nostrils every 12 (twelve) hours. 04/04/21   Mast, Man X, NP  loratadine (CLARITIN) 10 MG tablet Take 10 mg by mouth daily.    [provider]  LORazepam (ATIVAN) 0.5 MG tablet Take 0.5 mg by mouth daily as needed for anxiety.    [provider]  losartan-hydrochlorothiazide (HYZAAR) 100-12.5 MG tablet TAKE (1) TABLET DAILY FOR HIGH BLOOD PRESSURE. 03/28/21   Mast, Man X, NP   metoprolol succinate (TOPROL-XL) 25 MG 24 hr tablet Take 1 tablet (25 mg total) by mouth daily. 02/07/21   Mast, Man X, NP  omeprazole (PRILOSEC) 40 MG capsule TAKE 1 CAPSULE DAILY 1/2 HOUR BEFORE BREAKFAST FOR STOMACH ACID. 04/09/21   Mast, Man X, NP  Polyethyl Glycol-Propyl Glycol (SYSTANE ULTRA OP) Apply to eye in the morning, at noon, and at bedtime.    [provider]  Polyethyl Glycol-Propyl Glycol (SYSTANE) 0.4-0.3 % GEL ophthalmic gel Place 1 application into both eyes daily.    [provider]  traMADol (ULTRAM) 50 MG tablet Take 50 mg by mouth as needed.    [provider]  TRIAMCINOLONE ACETONIDE,NASAL, NA Place 2 sprays into the nose. Each nostril    [provider]    Allergies    Adhesive [tape], Ampicillin, Elemental sulfur, Nitrofuran derivatives, and Zoloft [sertraline hcl]  Review of Systems   Review of Systems  All other systems reviewed and are negative.   Physical Exam Updated Vital Signs BP 91/68   Pulse 78   Temp 98 F (36.7 C) (Oral)   Resp 17   Ht 1.676 m (5\' 6" )   Wt 79.4 kg   SpO2 99%   BMI 28.25 kg/m   Physical Exam Vitals and nursing note reviewed.  Constitutional:      General: She is not in acute distress.    Appearance: She is well-developed.  HENT:     Head: Normocephalic and atraumatic.     Right Ear: External ear normal.     Left Ear: External ear normal.  Eyes:     General: No scleral icterus.       Right eye: No discharge.        Left eye: No discharge.     Conjunctiva/sclera: Conjunctivae normal.  Neck:     Trachea: No tracheal deviation.  Cardiovascular:     Rate and Rhythm: Normal rate and regular rhythm.  Pulmonary:     Effort: Pulmonary effort is normal. No respiratory distress.     Breath sounds: Normal breath sounds. No stridor. No wheezing or rales.  Abdominal:     General: Bowel sounds are normal. There is no distension.     Palpations: Abdomen is soft.     Tenderness: There is no  abdominal tenderness. There is no guarding or rebound.  Musculoskeletal:     Right shoulder: No swelling, tenderness or bony tenderness.     Left shoulder: No swelling, tenderness or bony tenderness.     Right wrist: No swelling, tenderness or bony tenderness.     Left wrist: No swelling, tenderness or bony tenderness.     Cervical back: Neck supple. No  swelling, tenderness or bony tenderness.     Thoracic back: Tenderness present. No swelling or bony tenderness.     Lumbar back: Tenderness present. No swelling or bony tenderness.     Right hip: No tenderness or bony tenderness. Normal range of motion.     Left hip: No tenderness or bony tenderness. Normal range of motion.     Right ankle: No swelling. No tenderness.     Left ankle: No swelling. No tenderness.  Skin:    General: Skin is warm and dry.     Findings: No rash.  Neurological:     General: No focal deficit present.     Mental Status: She is alert and oriented to person, place, and time.     Cranial Nerves: No cranial nerve deficit (no facial droop, extraocular movements intact, no slurred speech).     Sensory: No sensory deficit.     Motor: No abnormal muscle tone or seizure activity.     Coordination: Coordination normal.     ED Results / Procedures / Treatments   Labs (all labs ordered are listed, but only abnormal results are displayed) Labs Reviewed  CBC - Abnormal; Notable for the following components:      Result Value   WBC 11.5 (*)    RBC 3.84 (*)    Hemoglobin 11.9 (*)    All other components within normal limits  BASIC METABOLIC PANEL - Abnormal; Notable for the following components:   Potassium 3.4 (*)    BUN 27 (*)    All other components within normal limits    EKG None  Radiology DG Thoracic Spine 2 View  Result Date: 06/03/2021 CLINICAL DATA:  Status post fall. EXAM: THORACIC SPINE 2 VIEWS COMPARISON:  None. FINDINGS: There is no evidence of acute thoracic spine fracture. Moderate to marked  severity dextroscoliosis of the lower thoracic and upper lumbar spine is seen. Moderate severity multilevel endplate sclerosis is seen with multilevel intervertebral disc space narrowing. No other significant bone abnormalities are identified. IMPRESSION: Multilevel degenerative changes without an acute osseous abnormality. Electronically Signed   By: Virgina Norfolk M.D.   On: 06/03/2021 17:34   DG Lumbar Spine Complete  Result Date: 06/03/2021 CLINICAL DATA:  Status post fall. EXAM: LUMBAR SPINE - COMPLETE 4+ VIEW COMPARISON:  None. FINDINGS: There is no evidence of an acute lumbar spine fracture. Moderate to marked severity dextroscoliosis is noted. Moderate to marked severity multilevel endplate sclerosis and osteophyte formation is seen. Moderate to marked severity multilevel facet joint sclerosis and intervertebral disc space narrowing is noted. IMPRESSION: Moderate to marked severity dextroscoliosis and multilevel degenerative changes without an acute osseous abnormality. Electronically Signed   By: Virgina Norfolk M.D.   On: 06/03/2021 17:36   CT Head Wo Contrast  Result Date: 06/03/2021 CLINICAL DATA:  Status post fall. EXAM: CT HEAD WITHOUT CONTRAST TECHNIQUE: Contiguous axial images were obtained from the base of the skull through the vertex without intravenous contrast. COMPARISON:  June 15, 2017 FINDINGS: Brain: There is mild cerebral atrophy with widening of the extra-axial spaces and ventricular dilatation. There are areas of decreased attenuation within the white matter tracts of the supratentorial brain, consistent with microvascular disease changes. A small chronic right basal ganglia lacunar infarct is noted. Vascular: No hyperdense vessel or unexpected calcification. Skull: Normal. Negative for fracture or focal lesion. Sinuses/Orbits: No acute finding. Other: None. IMPRESSION: 1. Generalized cerebral atrophy. 2. No acute intracranial abnormality. Electronically Signed   By: Virgina Norfolk  M.D.   On: 06/03/2021 16:06   CT Cervical Spine Wo Contrast  Result Date: 06/03/2021 CLINICAL DATA:  Status post fall. EXAM: CT CERVICAL SPINE WITHOUT CONTRAST TECHNIQUE: Multidetector CT imaging of the cervical spine was performed without intravenous contrast. Multiplanar CT image reconstructions were also generated. COMPARISON:  None. FINDINGS: Alignment: Approximately 1 mm retrolisthesis of the C5 vertebral body on C6. Skull base and vertebrae: No acute fracture. No primary bone lesion or focal pathologic process. Soft tissues and spinal canal: No prevertebral fluid or swelling. No visible canal hematoma. Disc levels: Moderate severity endplate sclerosis and moderate to marked severity osteophyte formation are seen at the levels of C5-C6 and C6-C7. There is marked severity narrowing of the anterior atlantoaxial articulation. Moderate to marked severity intervertebral disc space narrowing is seen at the levels of C5-C6 and C6-C7. Bilateral marked severity multilevel facet joint hypertrophy is noted. Upper chest: Negative. Other: None. IMPRESSION: 1. No acute cervical spine fracture. 2. Marked severity degenerative changes, most prominent at the levels of C5-C6 and C6-C7. Electronically Signed   By: Virgina Norfolk M.D.   On: 06/03/2021 16:15   CT Thoracic Spine Wo Contrast  Result Date: 06/03/2021 CLINICAL DATA:  Initial evaluation for acute mid back pain, compression fracture suspected. EXAM: CT THORACIC SPINE WITHOUT CONTRAST TECHNIQUE: Multidetector CT images of the thoracic were obtained using the standard protocol without intravenous contrast. COMPARISON:  Prior radiograph from earlier the same day. FINDINGS: Alignment: Mild diffuse levoscoliosis with straightening of the normal thoracic kyphosis. No listhesis. Vertebrae: Vertebral body height maintained without acute or chronic fracture. Visualized ribs intact. No discrete or worrisome osseous lesions. Paraspinal and other soft tissues:  Paraspinous soft tissues demonstrate no acute finding. Partially visualized lungs are largely clear. Aortic atherosclerosis with scattered coronary artery and valvular calcifications. Moderate to large sized hiatal hernia noted. Disc levels: Degenerative endplate spurring with scattered disc desiccation noted within the mid and lower thoracic spine. Prominent right-sided facet arthrosis present about the thoracolumbar junction related to scoliotic curvature. No significant disc bulge or visible focal disc herniation. No significant spinal stenosis. IMPRESSION: 1. No acute traumatic injury within the thoracic spine. 2. Mild diffuse levoscoliosis with prominent right-sided facet arthrosis about the thoracolumbar junction. No significant disc bulge or visible focal disc herniation. No significant spinal stenosis. 3. Moderate to large sized hiatal hernia. 4. Aortic Atherosclerosis (ICD10-I70.0). Electronically Signed   By: Jeannine Boga M.D.   On: 06/03/2021 21:56   CT Lumbar Spine Wo Contrast  Result Date: 06/03/2021 CLINICAL DATA:  Initial evaluation for low back pain, increased fracture risk, fall. EXAM: CT LUMBAR SPINE WITHOUT CONTRAST TECHNIQUE: Multidetector CT imaging of the lumbar spine was performed without intravenous contrast administration. Multiplanar CT image reconstructions were also generated. COMPARISON:  Prior radiograph from earlier the same day. FINDINGS: Segmentation: Standard. Lowest well-formed disc space labeled the L5-S1 level. Alignment: Moderate dextroscoliosis. 3 mm retrolisthesis of L1 on L2 and L2 on L3, chronic and facet mediated. Vertebrae: Chronic compression deformity noted at the inferior endplate of W11 with mild 20% height loss without bony retropulsion. Vertebral body height otherwise maintained with no acute or subacute fracture. Visualized sacrum and pelvis intact. SI joints symmetric and normal. No discrete or worrisome osseous lesions. Paraspinal and other soft  tissues: Chronic fatty atrophy noted throughout the lower posterior paraspinous musculature. Paraspinous soft tissues demonstrate no acute finding. Aortic atherosclerosis noted. Visualized visceral structures otherwise unremarkable. Disc levels: T12-L1: Prominent endplate osteophytic spurring along the left anterolateral aspect of the T12-L1  disc space. No significant disc bulge. Mild facet hypertrophy. No stenosis. L1-2: Trace retrolisthesis. Mild disc bulge with intervertebral disc space narrowing. Reactive endplate spurring on the left. Mild facet hypertrophy. No significant spinal stenosis. Foramina remain patent. L2-3: Trace retrolisthesis. Degenerative intervertebral disc space narrowing with diffuse disc bulge. Prominent left-sided reactive endplate spurring. Mild to moderate facet hypertrophy. Mild to moderate narrowing of the left lateral recess without significant spinal stenosis. Moderate left L2 foraminal narrowing. Right neural foramina remains patent. L3-4: Degenerative intervertebral disc space narrowing with disc desiccation and diffuse disc bulge. Prominent reactive endplate spurring, greater on the left. Severe right with moderate left facet arthrosis. Resultant moderate spinal stenosis. Moderate bilateral L3 foraminal narrowing. L4-5: Diffuse disc bulge with disc desiccation. Prominent right-sided reactive endplate spurring. Severe right with moderate left facet hypertrophy. Resultant moderate spinal stenosis. Moderate right with mild left L4 foraminal narrowing. L5-S1: Disc desiccation with intervertebral disc space narrowing and diffuse disc bulge. Reactive endplate spurring. Moderate bilateral facet hypertrophy. No significant spinal stenosis. Moderate bilateral L5 foraminal narrowing. IMPRESSION: 1. No acute traumatic injury within the lumbar spine. 2. Chronic compression deformity at the inferior endplate of A19 with mild 20% height loss without bony retropulsion. 3. Moderate dextroscoliosis  with multilevel degenerative spondylolysis and facet arthrosis as above. Resultant moderate spinal stenosis at L3-4 and L4-5 with moderate bilateral L2 through L5 foraminal narrowing as above. 4. Aortic Atherosclerosis (ICD10-I70.0). Electronically Signed   By: Jeannine Boga M.D.   On: 06/03/2021 22:13   CT PELVIS WO CONTRAST  Result Date: 06/03/2021 CLINICAL DATA:  Unwitnessed fall, pelvic injury, low back and pelvic pain. EXAM: CT PELVIS WITHOUT CONTRAST TECHNIQUE: Multidetector CT imaging of the pelvis was performed following the standard protocol without intravenous contrast. COMPARISON:  02/23/2017 FINDINGS: Urinary Tract:  No abnormality visualized. Bowel: There is moderate sigmoid diverticulosis noted. The visualized large and small bowel loops within the pelvis are otherwise unremarkable. No free intraperitoneal fluid within the pelvis. Vascular/Lymphatic: Mild atherosclerotic calcification noted within the iliofemoral arterial vasculature. No pathologic adenopathy within the pelvis. Reproductive:  Uterus absent.  No adnexal masses.  Pessary in place. Other:  The rectum is unremarkable. Musculoskeletal: No acute bone abnormality. Degenerative changes are seen within the lumbar spine. No lytic or blastic bone lesions are identified. IMPRESSION: No acute pelvic injury.  The visualized axial skeleton is intact. Aortic Atherosclerosis (ICD10-I70.0). Electronically Signed   By: Fidela Salisbury MD   On: 06/03/2021 21:13    Procedures Procedures   Medications Ordered in ED Medications  acetaminophen (TYLENOL) tablet 650 mg (has no administration in time range)  morphine 2 MG/ML injection 2 mg (2 mg Intravenous Given 06/03/21 1635)    ED Course  I have reviewed the triage vital signs and the nursing notes.  Pertinent labs & imaging results that were available during my care of the patient were reviewed by me and considered in my medical decision making (see chart for details).  Clinical  Course as of 06/03/21 2242  Mon Jun 03, 2021  1744 Lumbar and thoracic spines show multilevel degenerative changes but no acute fracture [JK]  1744 Patient CBC and metabolic panel are unremarkable.  Head CT and C-spine CT without acute findings [JK]  2240 Attempted to call son.  No answer [JK]    Clinical Course User Index [JK] Dorie Rank, MD   MDM Rules/Calculators/A&P  Patient presented to the ED for evaluation after a fall.  Patient's laboratory tests are reassuring.  No signs of any acute infection.  No anemia.  Patient's exam is reassuring.  No focal neurologic deficits to suggest stroke.  Patient did had several x-rays including CT scans of her cervical thoracic and lumbar spine.  No acute fractures noted.  Patient was able to get up and walk around with my assistance.  Low blood pressure noted but it has improved and the patient was resting at the time.  At this time she appears stable to be discharged back to her facility  final Clinical Impression(s) / ED Diagnoses Final diagnoses:  Fall, initial encounter  Contusion of back, unspecified laterality, initial encounter    Rx / DC Orders ED Discharge Orders    None       Dorie Rank, MD 06/03/21 2243

## 2021-06-03 NOTE — Discharge Instructions (Addendum)
Take Tylenol as needed for pain.  Continue your medications at home.  Make sure to use your walker to help get around.  Follow-up with your doctor to be rechecked

## 2021-06-03 NOTE — ED Notes (Signed)
Pt off the floor for scan

## 2021-06-03 NOTE — ED Notes (Signed)
Son, Louna Rothgeb would like an update on his mom. [phone]

## 2021-06-03 NOTE — ED Triage Notes (Signed)
Pt coming from independent living facility, slipt and fell while walking at 2130 last night. Pt was laying on floor all night, no loc, no anticoags. Pt c/o mid-lower back pain from being on floor for so long, not from initial fall. Pt A&O x4

## 2021-06-03 NOTE — ED Notes (Signed)
Patient stood on side of bed. Patient was not able to pull herself up. Patient stood for about 30 sec

## 2021-06-04 DIAGNOSIS — I959 Hypotension, unspecified: Secondary | ICD-10-CM | POA: Diagnosis not present

## 2021-06-04 DIAGNOSIS — W19XXXA Unspecified fall, initial encounter: Secondary | ICD-10-CM | POA: Diagnosis not present

## 2021-06-05 ENCOUNTER — Encounter: Payer: Self-pay | Admitting: Family Medicine

## 2021-06-06 ENCOUNTER — Non-Acute Institutional Stay: Payer: PPO | Admitting: Nurse Practitioner

## 2021-06-06 ENCOUNTER — Other Ambulatory Visit: Payer: Self-pay

## 2021-06-06 ENCOUNTER — Encounter: Payer: Self-pay | Admitting: Nurse Practitioner

## 2021-06-06 DIAGNOSIS — R7989 Other specified abnormal findings of blood chemistry: Secondary | ICD-10-CM

## 2021-06-06 DIAGNOSIS — R7303 Prediabetes: Secondary | ICD-10-CM | POA: Diagnosis not present

## 2021-06-06 DIAGNOSIS — F419 Anxiety disorder, unspecified: Secondary | ICD-10-CM

## 2021-06-06 DIAGNOSIS — K219 Gastro-esophageal reflux disease without esophagitis: Secondary | ICD-10-CM

## 2021-06-06 DIAGNOSIS — J309 Allergic rhinitis, unspecified: Secondary | ICD-10-CM

## 2021-06-06 DIAGNOSIS — I10 Essential (primary) hypertension: Secondary | ICD-10-CM | POA: Diagnosis not present

## 2021-06-06 DIAGNOSIS — E782 Mixed hyperlipidemia: Secondary | ICD-10-CM

## 2021-06-06 DIAGNOSIS — T148XXA Other injury of unspecified body region, initial encounter: Secondary | ICD-10-CM | POA: Diagnosis not present

## 2021-06-06 DIAGNOSIS — M545 Low back pain, unspecified: Secondary | ICD-10-CM | POA: Diagnosis not present

## 2021-06-06 NOTE — Assessment & Plan Note (Addendum)
ED eval 06/03/21 for mechanical fall, resulted back pain mid to lower portion, worsens with position changing. X-ray T/L spine showed multilevel degenerative changes, no acute findings CT head/C+T+L spine, and pelvis,  CBC/BMP unremarkable except K 3.4.    Hx of lower back pain, chronic, taking Tylenol, Tramadol.  Repeat CBC/diff, CMP/eGFR

## 2021-06-06 NOTE — Assessment & Plan Note (Signed)
akes  Losartan/HCT 100/12.5mg  qd, Metoprolol 25mg  qd since 02/07/21, takes ASA, Atorvastatin.

## 2021-06-06 NOTE — Assessment & Plan Note (Signed)
the patient c/o the left buttock region pain like sand paper rubbing pain, a patient's palm sized abrasion noted with superficial skin missing. The patient attributed it her carpet burning when she tried to move her self on th carpet after the fall.  Mildly redness, warmth in the wounded areas. Will apply Bactroban oint daily, cover with non adhesive dressing until healed.

## 2021-06-06 NOTE — Assessment & Plan Note (Signed)
takes Atorvastatin, LDL 67 09/24/20

## 2021-06-06 NOTE — Assessment & Plan Note (Signed)
Lorazepam prn available to her. discouraging Lorazepam use in setting of recent falls.

## 2021-06-06 NOTE — Assessment & Plan Note (Signed)
Elevated TSH 5.04 02/12/21, pending TSH

## 2021-06-06 NOTE — Assessment & Plan Note (Signed)
Hgb a1c 5.7 09/24/20, pending HgbA1c

## 2021-06-06 NOTE — Assessment & Plan Note (Signed)
stable on Omeprazole 40mg  qd.

## 2021-06-06 NOTE — Assessment & Plan Note (Signed)
off Fluticasone nasal spray qd, on Claritin 10mg  qd, Atrovent nasal spray.

## 2021-06-06 NOTE — Progress Notes (Signed)
Location:   clinic [facility]   Place of Service:  Clinic (12) Provider: Marlana Latus NP  Code Status: DNR Goals of Care: [place] 06/06/2021  Does Patient Have a Medical Advance Directive? Yes  Type of Paramedic of [place];Living will;Out of facility DNR (pink MOST or yellow form)  Does patient want to make changes to medical advance directive? No - Patient declined  Copy of [place] in Chart? Yes - validated most recent copy scanned in chart (See row information)  Would patient like information on creating a medical advance directive? -  Pre-existing out of facility DNR order (yellow form or pink MOST form) Pink MOST form placed in chart (order not valid for inpatient use)     Chief Complaint  Patient presents with   Acute Visit    Patient presents after having multiple falls this week. Bruising and pain on bottom and and lower back.      HPI: Patient is a 87 y.o. female seen today for f/u ED eval 06/03/21 for mechanical fall, resulted back pain mid to lower portion, worsens with position changing. X-ray T/L spine showed multilevel degenerative changes, no acute findings CT head/C+T+L spine, and pelvis,  CBC/BMP unremarkable except K 3.4.   The patient c/o the left buttock region pain like sand paper rubbing pain, a patient's palm sized abrasion noted with superficial skin missing. The patient attributed it her carpet burning when she tried to move her self on th carpet after the fall.   Hx of lower back pain, chronic, taking Tylenol, Tramadol.             Allergic rhinitis, off Fluticasone nasal spray qd, on Claritin 63m qd, Atrovent nasal spray.              Anxiety, Lorazepam prn available to her. TSH 5.04 02/12/21             Elevated TSH 5.04 02/12/21, TSH 5.07 06/06/21             HTN, takes  Losartan/HCT 100/12.576mqd, Metoprolol 2558md since 02/07/21, takes ASA, Atorvastatin.              GERD, stable on Omeprazole 19m12m.              Hyperlipidemia, takes Atorvastatin, LDL 67 09/24/20             Prediabetes Hgb a1c 5.7 09/24/20, 5.7 06/06/21         Past Medical History:  Diagnosis Date   GERD (gastroesophageal reflux disease)    Hx of cataract surgery    both eyes   Hyperglycemia    Hyperlipidemia    Hypertension    Lumbago 04/05/2016   Macular degeneration    ? which eye(s)   Obstructive sleep apnea    Osteoporosis, senile    Overactive bladder    Squamous cell skin cancer 2/16   left lower leg    Past Surgical History:  Procedure Laterality Date   ABDOMINAL HYSTERECTOMY  1960's   APPENDECTOMY  1960's   MOLE REMOVAL  2000   facial   TONSILLECTOMY      Allergies  Allergen Reactions   Adhesive [Tape]    Ampicillin    Elemental Sulfur    Nitrofuran Derivatives    Zoloft [Sertraline Hcl] Other (See Comments)    Dizziness, nausea, faint feeling, and nightmares      Allergies as of 06/06/2021  Reactions   Adhesive [tape]    Ampicillin    Elemental Sulfur    Nitrofuran Derivatives    Zoloft [sertraline Hcl] Other (See Comments)   Dizziness, nausea, faint feeling, and nightmares          Medication List        Accurate as of June 06, 2021 11:59 PM. If you have any questions, ask your nurse or doctor.          acetaminophen 500 MG tablet Commonly known as: TYLENOL Take 500 mg by mouth daily.   aspirin EC 81 MG tablet Take 1 tablet (81 mg total) by mouth daily.   atorvastatin 20 MG tablet Commonly known as: LIPITOR TAKE 1 TABLET ONCE DAILY.   Calcium Carb-Cholecalciferol 600-800 MG-UNIT Tabs Take 1 tablet by mouth 2 (two) times daily.   fluticasone 50 MCG/ACT nasal spray Commonly known as: FLONASE Place 1 spray into both nostrils daily. Each nostril   ipratropium 0.03 % nasal spray Commonly known as: ATROVENT Place 2 sprays into both nostrils every 12 (twelve) hours.   loratadine 10 MG tablet Commonly known as: CLARITIN Take 10 mg by mouth daily.    LORazepam 0.5 MG tablet Commonly known as: ATIVAN Take 0.5 mg by mouth daily as needed for anxiety.   losartan-hydrochlorothiazide 100-12.5 MG tablet Commonly known as: HYZAAR TAKE (1) TABLET DAILY FOR HIGH BLOOD PRESSURE.   metoprolol succinate 25 MG 24 hr tablet Commonly known as: TOPROL-XL Take 1 tablet (25 mg total) by mouth daily.   mupirocin cream 2 % Commonly known as: Bactroban Apply 1 application topically daily. Started by: Franklin Clapsaddle X Gene Colee, NP   omeprazole 40 MG capsule Commonly known as: PRILOSEC TAKE 1 CAPSULE DAILY 1/2 HOUR BEFORE BREAKFAST FOR STOMACH ACID.   Systane 0.4-0.3 % Gel ophthalmic gel Generic drug: Polyethyl Glycol-Propyl Glycol Place 1 application into both eyes daily.   SYSTANE ULTRA OP Apply to eye in the morning, at noon, and at bedtime.   traMADol 50 MG tablet Commonly known as: ULTRAM Take 50 mg by mouth as needed.   TRIAMCINOLONE ACETONIDE(NASAL) NA Place 2 sprays into the nose. Each nostril        Review of Systems:  Review of Systems  Constitutional:  Negative for appetite change, fatigue and fever.       Gradual weight loss-expected, diet  HENT:  Negative for congestion, hearing loss, postnasal drip, rhinorrhea, sinus pressure, sinus pain, sneezing, sore throat, trouble swallowing and voice change.        Improved.   Eyes:  Negative for visual disturbance.  Respiratory:  Negative for cough, shortness of breath and wheezing.   Cardiovascular:  Negative for leg swelling.  Gastrointestinal:  Positive for diarrhea. Negative for abdominal pain and constipation.       X1-2 times in the past 3 months, diet adjustment helped.   Genitourinary:  Negative for difficulty urinating, dysuria and urgency.       0-1x/night urination, occasionally urinary leakage.   Musculoskeletal:  Positive for arthralgias, back pain and gait problem.  Skin:  Positive for wound. Negative for color change.  Neurological:  Negative for speech difficulty, weakness  and light-headedness.       Tingling in toes sometimes, better after moves around  Psychiatric/Behavioral:  Negative for confusion and sleep disturbance. The patient is not nervous/anxious.    Health Maintenance  Topic Date Due   Zoster Vaccines- Shingrix (1 of 2) Never done   COVID-19 Vaccine (4 - Booster for Moderna series)  03/06/2021   INFLUENZA VACCINE  07/29/2021   TETANUS/TDAP  12/14/2022   DEXA SCAN  Completed   PNA vac Low Risk Adult  Completed   HPV VACCINES  Aged Out    Physical Exam: Vitals:   06/06/21 1302  BP: (!) 148/72  Pulse: 75  Resp: 18  Temp: 97.8 F (36.6 C)  SpO2: 96%  Weight: 173 lb 14.4 oz (78.9 kg)  Height: '5\' 6"'  (1.676 m)   Body mass index is 28.07 kg/m. Physical Exam Vitals and nursing note reviewed.  Constitutional:      Appearance: Normal appearance.  HENT:     Head: Normocephalic and atraumatic.     Nose: Nose normal. No congestion or rhinorrhea.     Mouth/Throat:     Mouth: Mucous membranes are moist.     Pharynx: No oropharyngeal exudate or posterior oropharyngeal erythema.  Eyes:     Extraocular Movements: Extraocular movements intact.     Conjunctiva/sclera: Conjunctivae normal.     Pupils: Pupils are equal, round, and reactive to light.  Cardiovascular:     Rate and Rhythm: Normal rate and regular rhythm.     Heart sounds: No murmur heard. Pulmonary:     Effort: Pulmonary effort is normal. No respiratory distress.     Breath sounds: No wheezing, rhonchi or rales.  Chest:     Chest wall: No tenderness.  Abdominal:     General: Bowel sounds are normal.     Palpations: Abdomen is soft.     Tenderness: There is no abdominal tenderness.  Musculoskeletal:     Cervical back: Normal range of motion and neck supple.     Right lower leg: No edema.     Left lower leg: No edema.  Skin:    General: Skin is warm and dry.     Findings: Bruising and erythema present.     Comments: The left gluteal sulcus region skin abrasion-carpet burn,  size of the patient's palm, redness, superficial skin missing, slightly warmth.   Neurological:     General: No focal deficit present.     Mental Status: She is alert and oriented to person, place, and time. Mental status is at baseline.     Cranial Nerves: No cranial nerve deficit.     Motor: No weakness.     Coordination: Coordination normal.     Gait: Gait abnormal.  Psychiatric:        Mood and Affect: Mood normal.        Behavior: Behavior normal.        Thought Content: Thought content normal.        Judgment: Judgment normal.    Labs reviewed: Basic Metabolic Panel: Recent Labs    09/24/20 0858 02/12/21 0700 06/03/21 1637 06/06/21 0700  NA 141 143 141  --   K 3.9 3.9 3.4*  --   CL 105 106 105  --   CO2 '28 28 28  ' --   GLUCOSE 147* 106* 93  --   BUN 25 25 27*  --   CREATININE 0.93* 0.86 0.72  --   CALCIUM 9.6 9.6 9.9  --   TSH  --  5.04*  --  5.07*   Liver Function Tests: Recent Labs    09/24/20 0858 02/12/21 0700  AST 22 20  ALT 14 16  BILITOT 0.6 0.6  PROT 6.0* 6.1   No results for input(s): LIPASE, AMYLASE in the last 8760 hours. No results for input(s): AMMONIA in the last  8760 hours. CBC: Recent Labs    09/24/20 0858 02/12/21 0700 06/03/21 1637  WBC 6.8 8.2 11.5*  NEUTROABS 3,794 4,936  --   HGB 12.7 12.2 11.9*  HCT 38.1 37.1 36.8  MCV 95.0 93.7 95.8  PLT 188 191 174   Lipid Panel: Recent Labs    09/24/20 0858  CHOL 135  HDL 54  LDLCALC 67  TRIG 63  CHOLHDL 2.5   Lab Results  Component Value Date   HGBA1C 5.6 06/06/2021    Procedures since last visit: DG Thoracic Spine 2 View  Result Date: 06/03/2021 CLINICAL DATA:  Status post fall. EXAM: THORACIC SPINE 2 VIEWS COMPARISON:  None. FINDINGS: There is no evidence of acute thoracic spine fracture. Moderate to marked severity dextroscoliosis of the lower thoracic and upper lumbar spine is seen. Moderate severity multilevel endplate sclerosis is seen with multilevel intervertebral disc  space narrowing. No other significant bone abnormalities are identified. IMPRESSION: Multilevel degenerative changes without an acute osseous abnormality. Electronically Signed   By: Virgina Norfolk M.D.   On: 06/03/2021 17:34   DG Lumbar Spine Complete  Result Date: 06/03/2021 CLINICAL DATA:  Status post fall. EXAM: LUMBAR SPINE - COMPLETE 4+ VIEW COMPARISON:  None. FINDINGS: There is no evidence of an acute lumbar spine fracture. Moderate to marked severity dextroscoliosis is noted. Moderate to marked severity multilevel endplate sclerosis and osteophyte formation is seen. Moderate to marked severity multilevel facet joint sclerosis and intervertebral disc space narrowing is noted. IMPRESSION: Moderate to marked severity dextroscoliosis and multilevel degenerative changes without an acute osseous abnormality. Electronically Signed   By: Virgina Norfolk M.D.   On: 06/03/2021 17:36   CT Head Wo Contrast  Result Date: 06/03/2021 CLINICAL DATA:  Status post fall. EXAM: CT HEAD WITHOUT CONTRAST TECHNIQUE: Contiguous axial images were obtained from the base of the skull through the vertex without intravenous contrast. COMPARISON:  June 15, 2017 FINDINGS: Brain: There is mild cerebral atrophy with widening of the extra-axial spaces and ventricular dilatation. There are areas of decreased attenuation within the white matter tracts of the supratentorial brain, consistent with microvascular disease changes. A small chronic right basal ganglia lacunar infarct is noted. Vascular: No hyperdense vessel or unexpected calcification. Skull: Normal. Negative for fracture or focal lesion. Sinuses/Orbits: No acute finding. Other: None. IMPRESSION: 1. Generalized cerebral atrophy. 2. No acute intracranial abnormality. Electronically Signed   By: Virgina Norfolk M.D.   On: 06/03/2021 16:06   CT Cervical Spine Wo Contrast  Result Date: 06/03/2021 CLINICAL DATA:  Status post fall. EXAM: CT CERVICAL SPINE WITHOUT CONTRAST  TECHNIQUE: Multidetector CT imaging of the cervical spine was performed without intravenous contrast. Multiplanar CT image reconstructions were also generated. COMPARISON:  None. FINDINGS: Alignment: Approximately 1 mm retrolisthesis of the C5 vertebral body on C6. Skull base and vertebrae: No acute fracture. No primary bone lesion or focal pathologic process. Soft tissues and spinal canal: No prevertebral fluid or swelling. No visible canal hematoma. Disc levels: Moderate severity endplate sclerosis and moderate to marked severity osteophyte formation are seen at the levels of C5-C6 and C6-C7. There is marked severity narrowing of the anterior atlantoaxial articulation. Moderate to marked severity intervertebral disc space narrowing is seen at the levels of C5-C6 and C6-C7. Bilateral marked severity multilevel facet joint hypertrophy is noted. Upper chest: Negative. Other: None. IMPRESSION: 1. No acute cervical spine fracture. 2. Marked severity degenerative changes, most prominent at the levels of C5-C6 and C6-C7. Electronically Signed   By: Virgina Norfolk  M.D.   On: 06/03/2021 16:15   CT Thoracic Spine Wo Contrast  Result Date: 06/03/2021 CLINICAL DATA:  Initial evaluation for acute mid back pain, compression fracture suspected. EXAM: CT THORACIC SPINE WITHOUT CONTRAST TECHNIQUE: Multidetector CT images of the thoracic were obtained using the standard protocol without intravenous contrast. COMPARISON:  Prior radiograph from earlier the same day. FINDINGS: Alignment: Mild diffuse levoscoliosis with straightening of the normal thoracic kyphosis. No listhesis. Vertebrae: Vertebral body height maintained without acute or chronic fracture. Visualized ribs intact. No discrete or worrisome osseous lesions. Paraspinal and other soft tissues: Paraspinous soft tissues demonstrate no acute finding. Partially visualized lungs are largely clear. Aortic atherosclerosis with scattered coronary artery and valvular  calcifications. Moderate to large sized hiatal hernia noted. Disc levels: Degenerative endplate spurring with scattered disc desiccation noted within the mid and lower thoracic spine. Prominent right-sided facet arthrosis present about the thoracolumbar junction related to scoliotic curvature. No significant disc bulge or visible focal disc herniation. No significant spinal stenosis. IMPRESSION: 1. No acute traumatic injury within the thoracic spine. 2. Mild diffuse levoscoliosis with prominent right-sided facet arthrosis about the thoracolumbar junction. No significant disc bulge or visible focal disc herniation. No significant spinal stenosis. 3. Moderate to large sized hiatal hernia. 4. Aortic Atherosclerosis (ICD10-I70.0). Electronically Signed   By: Jeannine Boga M.D.   On: 06/03/2021 21:56   CT Lumbar Spine Wo Contrast  Result Date: 06/03/2021 CLINICAL DATA:  Initial evaluation for low back pain, increased fracture risk, fall. EXAM: CT LUMBAR SPINE WITHOUT CONTRAST TECHNIQUE: Multidetector CT imaging of the lumbar spine was performed without intravenous contrast administration. Multiplanar CT image reconstructions were also generated. COMPARISON:  Prior radiograph from earlier the same day. FINDINGS: Segmentation: Standard. Lowest well-formed disc space labeled the L5-S1 level. Alignment: Moderate dextroscoliosis. 3 mm retrolisthesis of L1 on L2 and L2 on L3, chronic and facet mediated. Vertebrae: Chronic compression deformity noted at the inferior endplate of R84 with mild 20% height loss without bony retropulsion. Vertebral body height otherwise maintained with no acute or subacute fracture. Visualized sacrum and pelvis intact. SI joints symmetric and normal. No discrete or worrisome osseous lesions. Paraspinal and other soft tissues: Chronic fatty atrophy noted throughout the lower posterior paraspinous musculature. Paraspinous soft tissues demonstrate no acute finding. Aortic atherosclerosis  noted. Visualized visceral structures otherwise unremarkable. Disc levels: T12-L1: Prominent endplate osteophytic spurring along the left anterolateral aspect of the T12-L1 disc space. No significant disc bulge. Mild facet hypertrophy. No stenosis. L1-2: Trace retrolisthesis. Mild disc bulge with intervertebral disc space narrowing. Reactive endplate spurring on the left. Mild facet hypertrophy. No significant spinal stenosis. Foramina remain patent. L2-3: Trace retrolisthesis. Degenerative intervertebral disc space narrowing with diffuse disc bulge. Prominent left-sided reactive endplate spurring. Mild to moderate facet hypertrophy. Mild to moderate narrowing of the left lateral recess without significant spinal stenosis. Moderate left L2 foraminal narrowing. Right neural foramina remains patent. L3-4: Degenerative intervertebral disc space narrowing with disc desiccation and diffuse disc bulge. Prominent reactive endplate spurring, greater on the left. Severe right with moderate left facet arthrosis. Resultant moderate spinal stenosis. Moderate bilateral L3 foraminal narrowing. L4-5: Diffuse disc bulge with disc desiccation. Prominent right-sided reactive endplate spurring. Severe right with moderate left facet hypertrophy. Resultant moderate spinal stenosis. Moderate right with mild left L4 foraminal narrowing. L5-S1: Disc desiccation with intervertebral disc space narrowing and diffuse disc bulge. Reactive endplate spurring. Moderate bilateral facet hypertrophy. No significant spinal stenosis. Moderate bilateral L5 foraminal narrowing. IMPRESSION: 1. No acute traumatic injury within  the lumbar spine. 2. Chronic compression deformity at the inferior endplate of B15 with mild 20% height loss without bony retropulsion. 3. Moderate dextroscoliosis with multilevel degenerative spondylolysis and facet arthrosis as above. Resultant moderate spinal stenosis at L3-4 and L4-5 with moderate bilateral L2 through L5  foraminal narrowing as above. 4. Aortic Atherosclerosis (ICD10-I70.0). Electronically Signed   By: Jeannine Boga M.D.   On: 06/03/2021 22:13   CT PELVIS WO CONTRAST  Result Date: 06/03/2021 CLINICAL DATA:  Unwitnessed fall, pelvic injury, low back and pelvic pain. EXAM: CT PELVIS WITHOUT CONTRAST TECHNIQUE: Multidetector CT imaging of the pelvis was performed following the standard protocol without intravenous contrast. COMPARISON:  02/23/2017 FINDINGS: Urinary Tract:  No abnormality visualized. Bowel: There is moderate sigmoid diverticulosis noted. The visualized large and small bowel loops within the pelvis are otherwise unremarkable. No free intraperitoneal fluid within the pelvis. Vascular/Lymphatic: Mild atherosclerotic calcification noted within the iliofemoral arterial vasculature. No pathologic adenopathy within the pelvis. Reproductive:  Uterus absent.  No adnexal masses.  Pessary in place. Other:  The rectum is unremarkable. Musculoskeletal: No acute bone abnormality. Degenerative changes are seen within the lumbar spine. No lytic or blastic bone lesions are identified. IMPRESSION: No acute pelvic injury.  The visualized axial skeleton is intact. Aortic Atherosclerosis (ICD10-I70.0). Electronically Signed   By: Fidela Salisbury MD   On: 06/03/2021 21:13    Assessment/Plan  Lumbago ED eval 06/03/21 for mechanical fall, resulted back pain mid to lower portion, worsens with position changing. X-ray T/L spine showed multilevel degenerative changes, no acute findings CT head/C+T+L spine, and pelvis,  CBC/BMP unremarkable except K 3.4.    Hx of lower back pain, chronic, taking Tylenol, Tramadol.  Repeat CBC/diff, CMP/eGFR  Allergic rhinitis off Fluticasone nasal spray qd, on Claritin 53m qd, Atrovent nasal spray.   Anxiety Lorazepam prn available to her. discouraging Lorazepam use in setting of recent falls.   Elevated TSH Elevated TSH 5.04 02/12/21, pending TSH  HTN  (hypertension) akes  Losartan/HCT 100/12.531mqd, Metoprolol 252md since 02/07/21, takes ASA, Atorvastatin.   GERD (gastroesophageal reflux disease) stable on Omeprazole 36m8m.  Hyperlipidemia takes Atorvastatin, LDL 67 09/24/20  Prediabetes Hgb a1c 5.7 09/24/20, pending HgbA1c  Abrasion the patient c/o the left buttock region pain like sand paper rubbing pain, a patient's palm sized abrasion noted with superficial skin missing. The patient attributed it her carpet burning when she tried to move her self on th carpet after the fall.  Mildly redness, warmth in the wounded areas. Will apply Bactroban oint daily, cover with non adhesive dressing until healed.    Labs/tests ordered: CBC/diff, CMP/eGFR, TSH, Hgb a1c 06/11/21  Next appt:  2 weeks.

## 2021-06-07 ENCOUNTER — Encounter: Payer: Self-pay | Admitting: Nurse Practitioner

## 2021-06-07 LAB — HEMOGLOBIN A1C
Hgb A1c MFr Bld: 5.6 % of total Hgb (ref <5.7)
Mean Plasma Glucose: 114 mg/dL
eAG (mmol/L): 6.3 mmol/L

## 2021-06-07 LAB — TSH: TSH: 5.07 mIU/L — ABNORMAL HIGH (ref 0.40–4.50)

## 2021-06-07 MED ORDER — MUPIROCIN CALCIUM 2 % EX CREA: 1.0000 "application " | TOPICAL_CREAM | Freq: Every day | CUTANEOUS | 2 refills | Status: DC

## 2021-06-10 DIAGNOSIS — I10 Essential (primary) hypertension: Secondary | ICD-10-CM | POA: Diagnosis not present

## 2021-06-10 DIAGNOSIS — M545 Low back pain, unspecified: Secondary | ICD-10-CM | POA: Diagnosis not present

## 2021-06-10 DIAGNOSIS — R7989 Other specified abnormal findings of blood chemistry: Secondary | ICD-10-CM | POA: Diagnosis not present

## 2021-06-11 ENCOUNTER — Other Ambulatory Visit: Payer: Self-pay

## 2021-06-11 ENCOUNTER — Other Ambulatory Visit: Payer: PPO

## 2021-06-11 DIAGNOSIS — M545 Low back pain, unspecified: Secondary | ICD-10-CM

## 2021-06-11 DIAGNOSIS — R7989 Other specified abnormal findings of blood chemistry: Secondary | ICD-10-CM

## 2021-06-11 DIAGNOSIS — I10 Essential (primary) hypertension: Secondary | ICD-10-CM

## 2021-06-11 LAB — COMPLETE METABOLIC PANEL WITH GFR
AG Ratio: 2.1 (calc) (ref 1.0–2.5)
ALT: 22 U/L (ref 6–29)
AST: 25 U/L (ref 10–35)
Albumin: 4.1 g/dL (ref 3.6–5.1)
Alkaline phosphatase (APISO): 66 U/L (ref 37–153)
BUN/Creatinine Ratio: 32 (calc) — ABNORMAL HIGH (ref 6–22)
BUN: 28 mg/dL — ABNORMAL HIGH (ref 7–25)
CO2: 26 mmol/L (ref 20–32)
Calcium: 10.2 mg/dL (ref 8.6–10.4)
Chloride: 106 mmol/L (ref 98–110)
Creat: 0.87 mg/dL (ref 0.60–0.88)
GFR, Est African American: 66 mL/min/{1.73_m2} (ref >=60)
GFR, Est Non African American: 57 mL/min/{1.73_m2} — ABNORMAL LOW (ref >=60)
Globulin: 2 g/dL (calc) (ref 1.9–3.7)
Glucose, Bld: 108 mg/dL — ABNORMAL HIGH (ref 65–99)
Potassium: 4 mmol/L (ref 3.5–5.3)
Sodium: 143 mmol/L (ref 135–146)
Total Bilirubin: 0.4 mg/dL (ref 0.2–1.2)
Total Protein: 6.1 g/dL (ref 6.1–8.1)

## 2021-06-11 LAB — CBC WITH DIFFERENTIAL/PLATELET
Absolute Monocytes: 644 cells/uL (ref 200–950)
Basophils Absolute: 74 cells/uL (ref 0–200)
Basophils Relative: 0.8 %
Eosinophils Absolute: 221 cells/uL (ref 15–500)
Eosinophils Relative: 2.4 %
HCT: 36 % (ref 35.0–45.0)
Hemoglobin: 11.7 g/dL (ref 11.7–15.5)
Lymphs Abs: 2134 cells/uL (ref 850–3900)
MCH: 30.9 pg (ref 27.0–33.0)
MCHC: 32.5 g/dL (ref 32.0–36.0)
MCV: 95 fL (ref 80.0–100.0)
MPV: 9.9 fL (ref 7.5–12.5)
Monocytes Relative: 7 %
Neutro Abs: 6127 cells/uL (ref 1500–7800)
Neutrophils Relative %: 66.6 %
Platelets: 213 10*3/uL (ref 140–400)
RBC: 3.79 10*6/uL — ABNORMAL LOW (ref 3.80–5.10)
RDW: 14.3 % (ref 11.0–15.0)
Total Lymphocyte: 23.2 %
WBC: 9.2 10*3/uL (ref 3.8–10.8)

## 2021-06-11 LAB — TSH: TSH: 3.43 mIU/L (ref 0.40–4.50)

## 2021-06-20 ENCOUNTER — Encounter: Payer: Self-pay | Admitting: Nurse Practitioner

## 2021-06-20 ENCOUNTER — Other Ambulatory Visit: Payer: Self-pay

## 2021-06-20 ENCOUNTER — Non-Acute Institutional Stay: Payer: PPO | Admitting: Nurse Practitioner

## 2021-06-20 DIAGNOSIS — M545 Low back pain, unspecified: Secondary | ICD-10-CM

## 2021-06-20 DIAGNOSIS — R7989 Other specified abnormal findings of blood chemistry: Secondary | ICD-10-CM | POA: Diagnosis not present

## 2021-06-20 DIAGNOSIS — J309 Allergic rhinitis, unspecified: Secondary | ICD-10-CM | POA: Diagnosis not present

## 2021-06-20 DIAGNOSIS — I10 Essential (primary) hypertension: Secondary | ICD-10-CM | POA: Diagnosis not present

## 2021-06-20 DIAGNOSIS — F419 Anxiety disorder, unspecified: Secondary | ICD-10-CM

## 2021-06-20 DIAGNOSIS — K219 Gastro-esophageal reflux disease without esophagitis: Secondary | ICD-10-CM | POA: Diagnosis not present

## 2021-06-20 DIAGNOSIS — T148XXA Other injury of unspecified body region, initial encounter: Secondary | ICD-10-CM

## 2021-06-20 DIAGNOSIS — E782 Mixed hyperlipidemia: Secondary | ICD-10-CM

## 2021-06-20 DIAGNOSIS — R7303 Prediabetes: Secondary | ICD-10-CM | POA: Diagnosis not present

## 2021-06-20 NOTE — Progress Notes (Signed)
Location:   clinic [place]   Place of Service:   clinic [place] Provider: Marlana Latus NP  Code Status: DNR Goals of Care: [place]  Advanced Directives 06/20/2021  Does Patient Have a Medical Advance Directive? Yes  Type of Paramedic of [place];Living will;Out of facility DNR (pink MOST or yellow form)  Does patient want to make changes to medical advance directive? No - Patient declined  Copy of [place] in Chart? Yes - validated most recent copy scanned in chart (See row information)  Would patient like information on creating a medical advance directive? -  Pre-existing out of facility DNR order (yellow form or pink MOST form) Yellow form placed in chart (order not valid for inpatient use)     No chief complaint on file.   HPI: Patient is a 87 y.o. female seen today for medical management of chronic diseases.      Back pain mid to lower portion, worsens with position changing. X-ray T/L spine showed multilevel degenerative changes, no acute findings CT head/C+T+L spine, and pelvis             The left buttock skin abrasion, healed.              Hx of lower back pain, chronic, taking Tylenol, Tramadol.             Allergic rhinitis, off Fluticasone nasal spray qd, on Claritin 10mg  qd, Atrovent nasal spray.              Anxiety, Lorazepam prn available to her. TSH 5.04 02/12/21             Elevated TSH 5.04 02/12/21, TSH 5.07 06/06/21             HTN, takes  Losartan/HCT 100/12.5mg  qd, Metoprolol 25mg  qd since 02/07/21, takes ASA, Atorvastatin. Bun/creat 28/0.87 06/10/21             GERD, stable on Omeprazole 40mg  qd. Hgb 11.7 06/10/21             Hyperlipidemia, takes Atorvastatin, LDL 67 09/24/20             Prediabetes Hgb a1c 5.7 09/24/20, 5.7 06/06/21                   Past Medical History:  Diagnosis Date   GERD (gastroesophageal reflux disease)    Hx of cataract surgery    both eyes   Hyperglycemia    Hyperlipidemia    Hypertension     Lumbago 04/05/2016   Macular degeneration    ? which eye(s)   Obstructive sleep apnea    Osteoporosis, senile    Overactive bladder    Squamous cell skin cancer 2/16   left lower leg    Past Surgical History:  Procedure Laterality Date   ABDOMINAL HYSTERECTOMY  1960's   APPENDECTOMY  1960's   MOLE REMOVAL  2000   facial   TONSILLECTOMY      Allergies  Allergen Reactions   Adhesive [Tape]    Ampicillin    Elemental Sulfur    Nitrofuran Derivatives    Zoloft [Sertraline Hcl] Other (See Comments)    Dizziness, nausea, faint feeling, and nightmares      Allergies as of 06/20/2021       Reactions   Adhesive [tape]    Ampicillin    Elemental Sulfur    Nitrofuran Derivatives    Zoloft [sertraline Hcl] Other (See Comments)  Dizziness, nausea, faint feeling, and nightmares          Medication List        Accurate as of June 20, 2021 11:59 PM. If you have any questions, ask your nurse or doctor.          acetaminophen 500 MG tablet Commonly known as: TYLENOL Take 500 mg by mouth daily.   aspirin EC 81 MG tablet Take 1 tablet (81 mg total) by mouth daily.   atorvastatin 20 MG tablet Commonly known as: LIPITOR TAKE 1 TABLET ONCE DAILY.   Calcium Carb-Cholecalciferol 600-800 MG-UNIT Tabs Take 1 tablet by mouth 2 (two) times daily.   fluticasone 50 MCG/ACT nasal spray Commonly known as: FLONASE Place 1 spray into both nostrils daily. Each nostril   ipratropium 0.03 % nasal spray Commonly known as: ATROVENT Place 2 sprays into both nostrils every 12 (twelve) hours.   loratadine 10 MG tablet Commonly known as: CLARITIN Take 10 mg by mouth daily.   LORazepam 0.5 MG tablet Commonly known as: ATIVAN Take 0.5 mg by mouth daily as needed for anxiety.   losartan-hydrochlorothiazide 100-12.5 MG tablet Commonly known as: HYZAAR TAKE (1) TABLET DAILY FOR HIGH BLOOD PRESSURE.   metoprolol succinate 25 MG 24 hr tablet Commonly known as: TOPROL-XL Take 1  tablet (25 mg total) by mouth daily.   mupirocin cream 2 % Commonly known as: Bactroban Apply 1 application topically daily.   omeprazole 40 MG capsule Commonly known as: PRILOSEC TAKE 1 CAPSULE DAILY 1/2 HOUR BEFORE BREAKFAST FOR STOMACH ACID.   Systane 0.4-0.3 % Gel ophthalmic gel Generic drug: Polyethyl Glycol-Propyl Glycol Place 1 application into both eyes daily.   SYSTANE ULTRA OP Apply to eye in the morning, at noon, and at bedtime.   traMADol 50 MG tablet Commonly known as: ULTRAM Take 50 mg by mouth as needed.   TRIAMCINOLONE ACETONIDE(NASAL) NA Place 2 sprays into the nose. Each nostril        Review of Systems:  Review of Systems  Constitutional:  Negative for activity change, appetite change and fever.       Gradual weight loss-expected, diet  HENT:  Negative for congestion, hearing loss, trouble swallowing and voice change.        Improved.   Eyes:  Negative for visual disturbance.  Respiratory:  Negative for cough, shortness of breath and wheezing.   Cardiovascular:  Negative for leg swelling.  Gastrointestinal:  Negative for abdominal pain and constipation.       X1-2 times in the past 3 months, diet adjustment helped.   Genitourinary:  Negative for difficulty urinating, dysuria and urgency.       0-1x/night urination, occasionally urinary leakage.   Musculoskeletal:  Positive for arthralgias, back pain and gait problem.  Skin:  Negative for color change.  Neurological:  Negative for speech difficulty, weakness and light-headedness.       Tingling in toes sometimes, better after moves around  Psychiatric/Behavioral:  Negative for confusion and sleep disturbance. The patient is not nervous/anxious.    Health Maintenance  Topic Date Due   Zoster Vaccines- Shingrix (1 of 2) Never done   COVID-19 Vaccine (4 - Booster for Moderna series) 03/06/2021   INFLUENZA VACCINE  07/29/2021   TETANUS/TDAP  12/14/2022   DEXA SCAN  Completed   PNA vac Low Risk Adult   Completed   HPV VACCINES  Aged Out    Physical Exam: Vitals:   06/20/21 1454  BP: 136/76  Pulse: 87  Resp: 18  Temp: 97.7 F (36.5 C)  SpO2: 94%  Weight: 177 lb 6.4 oz (80.5 kg)  Height: 5\' 6"  (1.676 m)   Body mass index is 28.63 kg/m. Physical Exam Vitals and nursing note reviewed.  Constitutional:      Appearance: Normal appearance.  HENT:     Head: Normocephalic and atraumatic.     Nose: Nose normal.     Mouth/Throat:     Mouth: Mucous membranes are moist.  Eyes:     Extraocular Movements: Extraocular movements intact.     Conjunctiva/sclera: Conjunctivae normal.     Pupils: Pupils are equal, round, and reactive to light.  Cardiovascular:     Rate and Rhythm: Normal rate and regular rhythm.     Heart sounds: No murmur heard. Pulmonary:     Effort: Pulmonary effort is normal.     Breath sounds: Normal breath sounds. No rales.  Abdominal:     General: Bowel sounds are normal.     Palpations: Abdomen is soft.     Tenderness: There is no abdominal tenderness.  Musculoskeletal:     Cervical back: Normal range of motion and neck supple.     Right lower leg: No edema.     Left lower leg: No edema.  Skin:    General: Skin is warm and dry.     Comments: The left gluteal sulcus region skin abrasion-carpet burn is healed.   Neurological:     General: No focal deficit present.     Mental Status: She is alert and oriented to person, place, and time. Mental status is at baseline.     Gait: Gait abnormal.  Psychiatric:        Mood and Affect: Mood normal.        Behavior: Behavior normal.        Thought Content: Thought content normal.        Judgment: Judgment normal.    Labs reviewed: Basic Metabolic Panel: Recent Labs    02/12/21 0700 06/03/21 1637 06/06/21 0700 06/10/21 1046  NA 143 141  --  143  K 3.9 3.4*  --  4.0  CL 106 105  --  106  CO2 28 28  --  26  GLUCOSE 106* 93  --  108*  BUN 25 27*  --  28*  CREATININE 0.86 0.72  --  0.87  CALCIUM 9.6 9.9   --  10.2  TSH 5.04*  --  5.07* 3.43   Liver Function Tests: Recent Labs    09/24/20 0858 02/12/21 0700 06/10/21 1046  AST 22 20 25   ALT 14 16 22   BILITOT 0.6 0.6 0.4  PROT 6.0* 6.1 6.1   No results for input(s): LIPASE, AMYLASE in the last 8760 hours. No results for input(s): AMMONIA in the last 8760 hours. CBC: Recent Labs    09/24/20 0858 02/12/21 0700 06/03/21 1637 06/10/21 1046  WBC 6.8 8.2 11.5* 9.2  NEUTROABS 3,794 4,936  --  6,127  HGB 12.7 12.2 11.9* 11.7  HCT 38.1 37.1 36.8 36.0  MCV 95.0 93.7 95.8 95.0  PLT 188 191 174 213   Lipid Panel: Recent Labs    09/24/20 0858  CHOL 135  HDL 54  LDLCALC 67  TRIG 63  CHOLHDL 2.5   Lab Results  Component Value Date   HGBA1C 5.6 06/06/2021    Procedures since last visit: DG Thoracic Spine 2 View  Result Date: 06/03/2021 CLINICAL DATA:  Status post fall. EXAM: THORACIC SPINE 2  VIEWS COMPARISON:  None. FINDINGS: There is no evidence of acute thoracic spine fracture. Moderate to marked severity dextroscoliosis of the lower thoracic and upper lumbar spine is seen. Moderate severity multilevel endplate sclerosis is seen with multilevel intervertebral disc space narrowing. No other significant bone abnormalities are identified. IMPRESSION: Multilevel degenerative changes without an acute osseous abnormality. Electronically Signed   By: Virgina Norfolk M.D.   On: 06/03/2021 17:34   DG Lumbar Spine Complete  Result Date: 06/03/2021 CLINICAL DATA:  Status post fall. EXAM: LUMBAR SPINE - COMPLETE 4+ VIEW COMPARISON:  None. FINDINGS: There is no evidence of an acute lumbar spine fracture. Moderate to marked severity dextroscoliosis is noted. Moderate to marked severity multilevel endplate sclerosis and osteophyte formation is seen. Moderate to marked severity multilevel facet joint sclerosis and intervertebral disc space narrowing is noted. IMPRESSION: Moderate to marked severity dextroscoliosis and multilevel degenerative  changes without an acute osseous abnormality. Electronically Signed   By: Virgina Norfolk M.D.   On: 06/03/2021 17:36   CT Head Wo Contrast  Result Date: 06/03/2021 CLINICAL DATA:  Status post fall. EXAM: CT HEAD WITHOUT CONTRAST TECHNIQUE: Contiguous axial images were obtained from the base of the skull through the vertex without intravenous contrast. COMPARISON:  June 15, 2017 FINDINGS: Brain: There is mild cerebral atrophy with widening of the extra-axial spaces and ventricular dilatation. There are areas of decreased attenuation within the white matter tracts of the supratentorial brain, consistent with microvascular disease changes. A small chronic right basal ganglia lacunar infarct is noted. Vascular: No hyperdense vessel or unexpected calcification. Skull: Normal. Negative for fracture or focal lesion. Sinuses/Orbits: No acute finding. Other: None. IMPRESSION: 1. Generalized cerebral atrophy. 2. No acute intracranial abnormality. Electronically Signed   By: Virgina Norfolk M.D.   On: 06/03/2021 16:06   CT Cervical Spine Wo Contrast  Result Date: 06/03/2021 CLINICAL DATA:  Status post fall. EXAM: CT CERVICAL SPINE WITHOUT CONTRAST TECHNIQUE: Multidetector CT imaging of the cervical spine was performed without intravenous contrast. Multiplanar CT image reconstructions were also generated. COMPARISON:  None. FINDINGS: Alignment: Approximately 1 mm retrolisthesis of the C5 vertebral body on C6. Skull base and vertebrae: No acute fracture. No primary bone lesion or focal pathologic process. Soft tissues and spinal canal: No prevertebral fluid or swelling. No visible canal hematoma. Disc levels: Moderate severity endplate sclerosis and moderate to marked severity osteophyte formation are seen at the levels of C5-C6 and C6-C7. There is marked severity narrowing of the anterior atlantoaxial articulation. Moderate to marked severity intervertebral disc space narrowing is seen at the levels of C5-C6 and  C6-C7. Bilateral marked severity multilevel facet joint hypertrophy is noted. Upper chest: Negative. Other: None. IMPRESSION: 1. No acute cervical spine fracture. 2. Marked severity degenerative changes, most prominent at the levels of C5-C6 and C6-C7. Electronically Signed   By: Virgina Norfolk M.D.   On: 06/03/2021 16:15   CT Thoracic Spine Wo Contrast  Result Date: 06/03/2021 CLINICAL DATA:  Initial evaluation for acute mid back pain, compression fracture suspected. EXAM: CT THORACIC SPINE WITHOUT CONTRAST TECHNIQUE: Multidetector CT images of the thoracic were obtained using the standard protocol without intravenous contrast. COMPARISON:  Prior radiograph from earlier the same day. FINDINGS: Alignment: Mild diffuse levoscoliosis with straightening of the normal thoracic kyphosis. No listhesis. Vertebrae: Vertebral body height maintained without acute or chronic fracture. Visualized ribs intact. No discrete or worrisome osseous lesions. Paraspinal and other soft tissues: Paraspinous soft tissues demonstrate no acute finding. Partially visualized lungs are largely  clear. Aortic atherosclerosis with scattered coronary artery and valvular calcifications. Moderate to large sized hiatal hernia noted. Disc levels: Degenerative endplate spurring with scattered disc desiccation noted within the mid and lower thoracic spine. Prominent right-sided facet arthrosis present about the thoracolumbar junction related to scoliotic curvature. No significant disc bulge or visible focal disc herniation. No significant spinal stenosis. IMPRESSION: 1. No acute traumatic injury within the thoracic spine. 2. Mild diffuse levoscoliosis with prominent right-sided facet arthrosis about the thoracolumbar junction. No significant disc bulge or visible focal disc herniation. No significant spinal stenosis. 3. Moderate to large sized hiatal hernia. 4. Aortic Atherosclerosis (ICD10-I70.0). Electronically Signed   By: Jeannine Boga  M.D.   On: 06/03/2021 21:56   CT Lumbar Spine Wo Contrast  Result Date: 06/03/2021 CLINICAL DATA:  Initial evaluation for low back pain, increased fracture risk, fall. EXAM: CT LUMBAR SPINE WITHOUT CONTRAST TECHNIQUE: Multidetector CT imaging of the lumbar spine was performed without intravenous contrast administration. Multiplanar CT image reconstructions were also generated. COMPARISON:  Prior radiograph from earlier the same day. FINDINGS: Segmentation: Standard. Lowest well-formed disc space labeled the L5-S1 level. Alignment: Moderate dextroscoliosis. 3 mm retrolisthesis of L1 on L2 and L2 on L3, chronic and facet mediated. Vertebrae: Chronic compression deformity noted at the inferior endplate of S34 with mild 20% height loss without bony retropulsion. Vertebral body height otherwise maintained with no acute or subacute fracture. Visualized sacrum and pelvis intact. SI joints symmetric and normal. No discrete or worrisome osseous lesions. Paraspinal and other soft tissues: Chronic fatty atrophy noted throughout the lower posterior paraspinous musculature. Paraspinous soft tissues demonstrate no acute finding. Aortic atherosclerosis noted. Visualized visceral structures otherwise unremarkable. Disc levels: T12-L1: Prominent endplate osteophytic spurring along the left anterolateral aspect of the T12-L1 disc space. No significant disc bulge. Mild facet hypertrophy. No stenosis. L1-2: Trace retrolisthesis. Mild disc bulge with intervertebral disc space narrowing. Reactive endplate spurring on the left. Mild facet hypertrophy. No significant spinal stenosis. Foramina remain patent. L2-3: Trace retrolisthesis. Degenerative intervertebral disc space narrowing with diffuse disc bulge. Prominent left-sided reactive endplate spurring. Mild to moderate facet hypertrophy. Mild to moderate narrowing of the left lateral recess without significant spinal stenosis. Moderate left L2 foraminal narrowing. Right neural  foramina remains patent. L3-4: Degenerative intervertebral disc space narrowing with disc desiccation and diffuse disc bulge. Prominent reactive endplate spurring, greater on the left. Severe right with moderate left facet arthrosis. Resultant moderate spinal stenosis. Moderate bilateral L3 foraminal narrowing. L4-5: Diffuse disc bulge with disc desiccation. Prominent right-sided reactive endplate spurring. Severe right with moderate left facet hypertrophy. Resultant moderate spinal stenosis. Moderate right with mild left L4 foraminal narrowing. L5-S1: Disc desiccation with intervertebral disc space narrowing and diffuse disc bulge. Reactive endplate spurring. Moderate bilateral facet hypertrophy. No significant spinal stenosis. Moderate bilateral L5 foraminal narrowing. IMPRESSION: 1. No acute traumatic injury within the lumbar spine. 2. Chronic compression deformity at the inferior endplate of H96 with mild 20% height loss without bony retropulsion. 3. Moderate dextroscoliosis with multilevel degenerative spondylolysis and facet arthrosis as above. Resultant moderate spinal stenosis at L3-4 and L4-5 with moderate bilateral L2 through L5 foraminal narrowing as above. 4. Aortic Atherosclerosis (ICD10-I70.0). Electronically Signed   By: Jeannine Boga M.D.   On: 06/03/2021 22:13   CT PELVIS WO CONTRAST  Result Date: 06/03/2021 CLINICAL DATA:  Unwitnessed fall, pelvic injury, low back and pelvic pain. EXAM: CT PELVIS WITHOUT CONTRAST TECHNIQUE: Multidetector CT imaging of the pelvis was performed following the standard protocol without intravenous  contrast. COMPARISON:  02/23/2017 FINDINGS: Urinary Tract:  No abnormality visualized. Bowel: There is moderate sigmoid diverticulosis noted. The visualized large and small bowel loops within the pelvis are otherwise unremarkable. No free intraperitoneal fluid within the pelvis. Vascular/Lymphatic: Mild atherosclerotic calcification noted within the iliofemoral  arterial vasculature. No pathologic adenopathy within the pelvis. Reproductive:  Uterus absent.  No adnexal masses.  Pessary in place. Other:  The rectum is unremarkable. Musculoskeletal: No acute bone abnormality. Degenerative changes are seen within the lumbar spine. No lytic or blastic bone lesions are identified. IMPRESSION: No acute pelvic injury.  The visualized axial skeleton is intact. Aortic Atherosclerosis (ICD10-I70.0). Electronically Signed   By: Fidela Salisbury MD   On: 06/03/2021 21:13    Assessment/Plan  Lumbago Hx of lower back pain, chronic, taking Tylenol, Tramadol. Back pain mid to lower portion, worsens with position changing. X-ray T/L spine showed multilevel degenerative changes, no acute findings CT head/C+T+L spine, and pelvis  Allergic rhinitis Allergic rhinitis, off Fluticasone nasal spray qd, on Claritin 10mg  qd, Atrovent nasal spray.   Anxiety Lorazepam prn available to her. TSH 5.04 02/12/21  Elevated TSH Elevated TSH 5.04 02/12/21, TSH 5.07 06/06/21  HTN (hypertension) Blood pressure is controlled, takes  Losartan/HCT 100/12.5mg  qd, Metoprolol 25mg  qd since 02/07/21, takes ASA, Atorvastatin. Bun/creat 28/0.87 06/10/21  GERD (gastroesophageal reflux disease) stable on Omeprazole 40mg  qd. Hgb 11.7 06/10/21  Hyperlipidemia takes Atorvastatin, LDL 67 09/24/20  Prediabetes Prediabetes Hgb a1c 5.7 09/24/20, 5.7 06/06/21  Abrasion he patient c/o the left buttock region pain like sand paper rubbing pain, a patient's palm sized abrasion noted with superficial skin missing. The patient attributed it her carpet burning when she tried to move her self on th carpet after the fal   Labs/tests ordered:  none  Next appt:  08/01/2021

## 2021-06-20 NOTE — Assessment & Plan Note (Signed)
stable on Omeprazole 40mg  qd. Hgb 11.7 06/10/21

## 2021-06-20 NOTE — Assessment & Plan Note (Signed)
Blood pressure is controlled, takes  Losartan/HCT 100/12.5mg  qd, Metoprolol 25mg  qd since 02/07/21, takes ASA, Atorvastatin. Bun/creat 28/0.87 06/10/21

## 2021-06-20 NOTE — Assessment & Plan Note (Signed)
Lorazepam prn available to her. TSH 5.04 02/12/21

## 2021-06-20 NOTE — Assessment & Plan Note (Signed)
Prediabetes Hgb a1c 5.7 09/24/20, 5.7 06/06/21

## 2021-06-20 NOTE — Assessment & Plan Note (Signed)
Elevated TSH 5.04 02/12/21, TSH 5.07 06/06/21

## 2021-06-20 NOTE — Assessment & Plan Note (Addendum)
Hx of lower back pain, chronic, taking Tylenol, Tramadol. Back pain mid to lower portion, worsens with position changing. X-ray T/L spine showed multilevel degenerative changes, no acute findings CT head/C+T+L spine, and pelvis

## 2021-06-20 NOTE — Assessment & Plan Note (Signed)
takes Atorvastatin, LDL 67 09/24/20

## 2021-06-20 NOTE — Assessment & Plan Note (Signed)
he patient c/o the left buttock region pain like sand paper rubbing pain, a patient's palm sized abrasion noted with superficial skin missing. The patient attributed it her carpet burning when she tried to move her self on th carpet after the fal

## 2021-06-20 NOTE — Assessment & Plan Note (Signed)
Allergic rhinitis, off Fluticasone nasal spray qd, on Claritin 10mg  qd, Atrovent nasal spray.

## 2021-06-21 ENCOUNTER — Encounter: Payer: Self-pay | Admitting: Nurse Practitioner

## 2021-06-27 ENCOUNTER — Other Ambulatory Visit: Payer: Self-pay | Admitting: Nurse Practitioner

## 2021-06-28 ENCOUNTER — Other Ambulatory Visit: Payer: Self-pay | Admitting: Nurse Practitioner

## 2021-07-02 ENCOUNTER — Other Ambulatory Visit: Payer: Self-pay | Admitting: Nurse Practitioner

## 2021-07-05 ENCOUNTER — Other Ambulatory Visit: Payer: Self-pay | Admitting: Nurse Practitioner

## 2021-07-05 ENCOUNTER — Telehealth: Payer: Self-pay

## 2021-07-05 MED ORDER — METOPROLOL SUCCINATE ER 25 MG PO TB24: 12.5000 mg | ORAL_TABLET | Freq: Every day | ORAL | 1 refills | Status: DC

## 2021-07-05 NOTE — Telephone Encounter (Signed)
Jean [name] called stating she needs a new order stating the metoprolol was decreased to 12.5mg  daily (Done in April). Should be called into [facility]. She wants new directions on the bottle. Med sent.

## 2021-07-16 ENCOUNTER — Other Ambulatory Visit: Payer: Self-pay

## 2021-07-16 ENCOUNTER — Ambulatory Visit
Admission: RE | Admit: 2021-07-16 | Discharge: 2021-07-16 | Disposition: A | Discharge status: Home / Self Care | Payer: PPO | Source: Ambulatory Visit | Attending: Nurse Practitioner | Admitting: Nurse Practitioner

## 2021-07-16 DIAGNOSIS — E2839 Other primary ovarian failure: Secondary | ICD-10-CM

## 2021-07-16 DIAGNOSIS — Z780 Asymptomatic menopausal state: Secondary | ICD-10-CM | POA: Diagnosis not present

## 2021-07-16 DIAGNOSIS — M85851 Other specified disorders of bone density and structure, right thigh: Secondary | ICD-10-CM | POA: Diagnosis not present

## 2021-07-25 ENCOUNTER — Encounter: Payer: Self-pay | Admitting: Nurse Practitioner

## 2021-07-25 ENCOUNTER — Other Ambulatory Visit: Payer: Self-pay

## 2021-07-25 ENCOUNTER — Non-Acute Institutional Stay: Payer: PPO | Admitting: Nurse Practitioner

## 2021-07-25 DIAGNOSIS — F419 Anxiety disorder, unspecified: Secondary | ICD-10-CM

## 2021-07-25 DIAGNOSIS — M8580 Other specified disorders of bone density and structure, unspecified site: Secondary | ICD-10-CM

## 2021-07-25 DIAGNOSIS — R7303 Prediabetes: Secondary | ICD-10-CM | POA: Diagnosis not present

## 2021-07-25 DIAGNOSIS — I10 Essential (primary) hypertension: Secondary | ICD-10-CM | POA: Diagnosis not present

## 2021-07-25 DIAGNOSIS — J309 Allergic rhinitis, unspecified: Secondary | ICD-10-CM | POA: Diagnosis not present

## 2021-07-25 DIAGNOSIS — K219 Gastro-esophageal reflux disease without esophagitis: Secondary | ICD-10-CM

## 2021-07-25 DIAGNOSIS — E782 Mixed hyperlipidemia: Secondary | ICD-10-CM

## 2021-07-25 DIAGNOSIS — R7989 Other specified abnormal findings of blood chemistry: Secondary | ICD-10-CM

## 2021-07-25 DIAGNOSIS — Z780 Asymptomatic menopausal state: Secondary | ICD-10-CM

## 2021-07-25 DIAGNOSIS — M545 Low back pain, unspecified: Secondary | ICD-10-CM | POA: Diagnosis not present

## 2021-07-25 MED ORDER — ALENDRONATE SODIUM 70 MG PO TABS: 70.0000 mg | ORAL_TABLET | ORAL | 1 refills | Status: DC

## 2021-07-25 NOTE — Assessment & Plan Note (Signed)
Back pain mid to lower portion, worsens with position changing. X-ray T/L spine showed multilevel degenerative changes, no acute findings CT head/C+T+L spine, and pelvis             Hx of lower back pain, chronic, taking Tylenol, Tramadol.

## 2021-07-25 NOTE — Assessment & Plan Note (Signed)
Elevated TSH 5.04 02/12/21, TSH 5.07 06/06/21

## 2021-07-25 NOTE — Assessment & Plan Note (Signed)
Hgb a1c 5.7 09/24/20, 5.7 06/06/21

## 2021-07-25 NOTE — Assessment & Plan Note (Addendum)
07/16/21 DEXA t score -1.7, continue Vit D, Ca. Probability of hip fracture risk >3% in 10 years, may consider Alendronate-the patient desire to try if cost is acceptable.

## 2021-07-25 NOTE — Assessment & Plan Note (Signed)
stable on Omeprazole 40mg  qd. Hgb 11.7 06/10/21

## 2021-07-25 NOTE — Assessment & Plan Note (Signed)
Lorazepam prn available to her. TSH 5.04 02/12/21

## 2021-07-25 NOTE — Progress Notes (Signed)
Location:   clinic [facility]   Place of Service:  Clinic (12) Provider: Marlana Latus NP  Code Status: DNR Goals of Care: [place] 07/25/2021  Does Patient Have a Medical Advance Directive? Yes  Type of Paramedic of [place];Living will  Does patient want to make changes to medical advance directive? No - Patient declined  Copy of [place] in Chart? Yes - validated most recent copy scanned in chart (See row information)  Would patient like information on creating a medical advance directive? -  Pre-existing out of facility DNR order (yellow form or pink MOST form) Yellow form placed in chart (order not valid for inpatient use);Pink MOST form placed in chart (order not valid for inpatient use)     Chief Complaint  Patient presents with   Acute Visit    Patient presents for a review of medications    HPI: Patient is a 87 y.o. female seen today for medical management of chronic diseases.      Back pain mid to lower portion, worsens with position changing. X-ray T/L spine showed multilevel degenerative changes, no acute findings CT head/C+T+L spine, and pelvis             Hx of lower back pain, chronic, taking Tylenol, Tramadol.             Allergic rhinitis, off Fluticasone nasal spray qd, on Claritin '10mg'$  qd, Atrovent nasal spray.              Anxiety, Lorazepam prn available to her. TSH 5.04 02/12/21             Elevated TSH 5.04 02/12/21, TSH 5.07 06/06/21             HTN, takes  Losartan/HCT 100/12.'5mg'$  qd, Metoprolol '25mg'$  qd since 02/07/21, takes ASA, Atorvastatin. Bun/creat 28/0.87 06/10/21             GERD, stable on Omeprazole '40mg'$  qd. Hgb 11.7 06/10/21             Hyperlipidemia, takes Atorvastatin, LDL 67 09/24/20             Prediabetes Hgb a1c 5.7 09/24/20, 5.7 06/06/21      Past Medical History:  Diagnosis Date   GERD (gastroesophageal reflux disease)    Hx of cataract surgery    both eyes   Hyperglycemia    Hyperlipidemia     Hypertension    Lumbago 04/05/2016   Macular degeneration    ? which eye(s)   Obstructive sleep apnea    Osteoporosis, senile    Overactive bladder    Squamous cell skin cancer 2/16   left lower leg    Past Surgical History:  Procedure Laterality Date   ABDOMINAL HYSTERECTOMY  1960's   APPENDECTOMY  1960's   MOLE REMOVAL  2000   facial   TONSILLECTOMY      Allergies  Allergen Reactions   Adhesive [Tape]    Ampicillin    Elemental Sulfur    Nitrofuran Derivatives    Zoloft [Sertraline Hcl] Other (See Comments)    Dizziness, nausea, faint feeling, and nightmares      Allergies as of 07/25/2021       Reactions   Adhesive [tape]    Ampicillin    Elemental Sulfur    Nitrofuran Derivatives    Zoloft [sertraline Hcl] Other (See Comments)   Dizziness, nausea, faint feeling, and nightmares  Medication List        Accurate as of July 25, 2021 11:59 PM. If you have any questions, ask your nurse or doctor.          acetaminophen 500 MG tablet Commonly known as: TYLENOL Take 500 mg by mouth daily.   alendronate 70 MG tablet Commonly known as: Fosamax Take 1 tablet (70 mg total) by mouth once a week. Take with a full glass of water on an empty stomach. Started by: Aashi Derrington X Jonathan Kirkendoll, NP   aspirin EC 81 MG tablet Take 1 tablet (81 mg total) by mouth daily.   atorvastatin 20 MG tablet Commonly known as: LIPITOR TAKE 1 TABLET ONCE DAILY.   Calcium Carb-Cholecalciferol 600-800 MG-UNIT Tabs Take 1 tablet by mouth 2 (two) times daily.   fluticasone 50 MCG/ACT nasal spray Commonly known as: FLONASE Place 1 spray into both nostrils daily. Each nostril   ipratropium 0.03 % nasal spray Commonly known as: ATROVENT Place 2 sprays into both nostrils every 12 (twelve) hours.   loratadine 10 MG tablet Commonly known as: CLARITIN Take 10 mg by mouth daily.   LORazepam 0.5 MG tablet Commonly known as: ATIVAN Take 0.5 mg by mouth daily as needed for anxiety.    losartan-hydrochlorothiazide 100-12.5 MG tablet Commonly known as: HYZAAR TAKE (1) TABLET DAILY FOR HIGH BLOOD PRESSURE.   metoprolol succinate 25 MG 24 hr tablet Commonly known as: TOPROL-XL Take 0.5 tablets (12.5 mg total) by mouth daily.   mupirocin cream 2 % Commonly known as: Bactroban Apply 1 application topically daily.   omeprazole 40 MG capsule Commonly known as: PRILOSEC TAKE 1 CAPSULE DAILY 1/2 HOUR BEFORE BREAKFAST FOR STOMACH ACID.   Systane 0.4-0.3 % Gel ophthalmic gel Generic drug: Polyethyl Glycol-Propyl Glycol Place 1 application into both eyes daily.   SYSTANE ULTRA OP Apply to eye in the morning, at noon, and at bedtime.   traMADol 50 MG tablet Commonly known as: ULTRAM Take 50 mg by mouth as needed.   TRIAMCINOLONE ACETONIDE(NASAL) NA Place 2 sprays into the nose. Each nostril        Review of Systems:  Review of Systems  Constitutional:  Negative for appetite change, fatigue and fever.  HENT:  Negative for congestion, hearing loss, trouble swallowing and voice change.        Improved.   Eyes:  Negative for visual disturbance.  Respiratory:  Negative for cough and shortness of breath.   Cardiovascular:  Negative for leg swelling.  Gastrointestinal:  Negative for abdominal pain and constipation.       X1-2 times in the past 3 months, diet adjustment helped.   Genitourinary:  Negative for dysuria and urgency.       0-1x/night urination, occasionally urinary leakage.   Musculoskeletal:  Positive for arthralgias, back pain and gait problem.  Skin:  Negative for color change.  Neurological:  Negative for speech difficulty, weakness and light-headedness.       Tingling in toes sometimes, better after moves around  Psychiatric/Behavioral:  Negative for confusion and sleep disturbance. The patient is not nervous/anxious.    Health Maintenance  Topic Date Due   Zoster Vaccines- Shingrix (1 of 2) Never done   COVID-19 Vaccine (4 - Booster for Moderna  series) 02/06/2021   INFLUENZA VACCINE  07/29/2021   TETANUS/TDAP  12/14/2022   DEXA SCAN  Completed   PNA vac Low Risk Adult  Completed   HPV VACCINES  Aged Out    Physical Exam: Vitals:  07/25/21 1532  BP: (!) 142/72  Pulse: 70  Resp: 18  Temp: 97.7 F (36.5 C)  SpO2: 97%  Weight: 175 lb 8 oz (79.6 kg)  Height: '5\' 6"'$  (1.676 m)   Body mass index is 28.33 kg/m. Physical Exam Vitals and nursing note reviewed.  Constitutional:      Appearance: Normal appearance.  HENT:     Head: Normocephalic and atraumatic.     Mouth/Throat:     Mouth: Mucous membranes are moist.  Eyes:     Extraocular Movements: Extraocular movements intact.     Conjunctiva/sclera: Conjunctivae normal.     Pupils: Pupils are equal, round, and reactive to light.  Cardiovascular:     Rate and Rhythm: Normal rate and regular rhythm.     Heart sounds: No murmur heard. Pulmonary:     Effort: Pulmonary effort is normal.     Breath sounds: Normal breath sounds. No rales.  Abdominal:     General: Bowel sounds are normal.     Palpations: Abdomen is soft.     Tenderness: There is no abdominal tenderness.  Musculoskeletal:     Cervical back: Normal range of motion and neck supple.     Right lower leg: No edema.     Left lower leg: No edema.  Skin:    General: Skin is warm and dry.     Comments: The left gluteal sulcus region skin abrasion-carpet burn is healed.   Neurological:     General: No focal deficit present.     Mental Status: She is alert and oriented to person, place, and time. Mental status is at baseline.     Gait: Gait abnormal.  Psychiatric:        Mood and Affect: Mood normal.        Behavior: Behavior normal.        Thought Content: Thought content normal.        Judgment: Judgment normal.    Labs reviewed: Basic Metabolic Panel: Recent Labs    02/12/21 0700 06/03/21 1637 06/06/21 0700 06/10/21 1046  NA 143 141  --  143  K 3.9 3.4*  --  4.0  CL 106 105  --  106  CO2 28 28   --  26  GLUCOSE 106* 93  --  108*  BUN 25 27*  --  28*  CREATININE 0.86 0.72  --  0.87  CALCIUM 9.6 9.9  --  10.2  TSH 5.04*  --  5.07* 3.43   Liver Function Tests: Recent Labs    09/24/20 0858 02/12/21 0700 06/10/21 1046  AST '22 20 25  '$ ALT '14 16 22  '$ BILITOT 0.6 0.6 0.4  PROT 6.0* 6.1 6.1   No results for input(s): LIPASE, AMYLASE in the last 8760 hours. No results for input(s): AMMONIA in the last 8760 hours. CBC: Recent Labs    09/24/20 0858 02/12/21 0700 06/03/21 1637 06/10/21 1046  WBC 6.8 8.2 11.5* 9.2  NEUTROABS 3,794 4,936  --  6,127  HGB 12.7 12.2 11.9* 11.7  HCT 38.1 37.1 36.8 36.0  MCV 95.0 93.7 95.8 95.0  PLT 188 191 174 213   Lipid Panel: Recent Labs    09/24/20 0858  CHOL 135  HDL 54  LDLCALC 67  TRIG 63  CHOLHDL 2.5   Lab Results  Component Value Date   HGBA1C 5.6 06/06/2021    Procedures since last visit: DG Bone Density  Result Date: 07/16/2021 EXAM: DUAL X-RAY ABSORPTIOMETRY (DXA) FOR BONE MINERAL DENSITY  IMPRESSION: Referring Physician:  Lauree Chandler Your patient completed a bone mineral density test using GE Lunar iDXA system (analysis version: 16). Technologist: [place] PATIENT: Name: Rewa, Deckelman Patient ID: [id number] Birth Date: 08/05/1924 Height: 62.0 in. Sex: Female Measured: 07/16/2021 Weight: 174.0 lbs. Indications: Advanced Age, Caucasian, Estrogen Deficient, Height Loss (781.91), Hysterectomy, Omeprazole, Postmenopausal Fractures: None Treatments: Calcium (E943.0) ASSESSMENT: The BMD measured at Femur Neck Right is 0.804 g/cm2 with a T-score of -1.7. This patient is considered osteopenic/low bone mass according to [facility]) criteria. The quality of the exam is good. The lumbar spine was excluded due to degenerative changes. Patient is not a candidate for FRAX due to age. Site Region Measured Date Measured Age YA BMD Significant CHANGE T-score DualFemur Neck Right 07/16/2021 95.4 -1.7 0.804 g/cm2 * DualFemur Neck  Right 09/21/2018 92.6 -1.3 0.862 g/cm2 DualFemur Total Mean 07/16/2021 95.4 -1.0 0.877 g/cm2 * DualFemur Total Mean 09/21/2018 92.6 -0.8 0.905 g/cm2 Left Forearm Radius 33% 07/16/2021 95.4 -0.1 0.875 g/cm2 Left Forearm Radius 33% 09/21/2018 92.6 0.1 0.895 g/cm2 World Health Organization [facility]) criteria for post-menopausal, Caucasian Women: Normal       T-score at or above -1 SD Osteopenia   T-score between -1 and -2.5 SD Osteoporosis T-score at or below -2.5 SD RECOMMENDATION: 1. All patients should optimize calcium and vitamin D intake. 2. Consider FDA-approved medical therapies in postmenopausal women and men aged 20 years and older, based on the following: a. A hip or vertebral (clinical or morphometric) fracture. b. T-score = -2.5 at the femoral neck or spine after appropriate evaluation to exclude secondary causes. c. Low bone mass (T-score between -1.0 and -2.5 at the femoral neck or spine) and a 10-year probability of a hip fracture = 3% or a 10-year probability of a major osteoporosis-related fracture = 20% based on the [place]-adapted WHO algorithm. d. Clinician judgment and/or patient preferences may indicate treatment for people with 10-year fracture probabilities above or below these levels. FOLLOW-UP: Patients with diagnosis of osteoporosis or at high risk for fracture should have regular bone mineral density tests.? Patients eligible for Medicare are allowed routine testing every 2 years.? The testing frequency can be increased to one year for patients who have rapidly progressing disease, are receiving or discontinuing medical therapy to restore bone mass, or have additional risk factors. I have reviewed this study and agree with the findings. Mark A. Thornton Papas, M.D. [facility] Electronically Signed   By: Lavonia Dana M.D.   On: 07/16/2021 16:50    Assessment/Plan  Lumbago Back pain mid to lower portion, worsens with position changing. X-ray T/L spine showed multilevel degenerative changes,  no acute findings CT head/C+T+L spine, and pelvis             Hx of lower back pain, chronic, taking Tylenol, Tramadol.  Allergic rhinitis Allergic rhinitis, off Fluticasone nasal spray qd, on Claritin '10mg'$  qd, Atrovent nasal spray.   Anxiety Lorazepam prn available to her. TSH 5.04 02/12/21  Elevated TSH Elevated TSH 5.04 02/12/21, TSH 5.07 06/06/21  HTN (hypertension) takes  Losartan/HCT 100/12.'5mg'$  qd, Metoprolol '25mg'$  qd since 02/07/21, takes ASA, Atorvastatin. Bun/creat 28/0.87 06/10/21  GERD (gastroesophageal reflux disease) stable on Omeprazole '40mg'$  qd. Hgb 11.7 06/10/21  Hyperlipidemia takes Atorvastatin, LDL 67 09/24/20  Prediabetes Hgb a1c 5.7 09/24/20, 5.7 06/06/21  Osteopenia after menopause 07/16/21 DEXA t score -1.7, continue Vit D, Ca. Probability of hip fracture risk >3% in 10 years, may consider Alendronate-the patient desire to try if cost  is acceptable.    Labs/tests ordered:  none  Next appt:  08/01/2021

## 2021-07-25 NOTE — Assessment & Plan Note (Signed)
takes  Losartan/HCT 100/12.'5mg'$  qd, Metoprolol '25mg'$  qd since 02/07/21, takes ASA, Atorvastatin. Bun/creat 28/0.87 06/10/21

## 2021-07-25 NOTE — Assessment & Plan Note (Signed)
takes Atorvastatin, LDL 67 09/24/20

## 2021-07-25 NOTE — Assessment & Plan Note (Signed)
Allergic rhinitis, off Fluticasone nasal spray qd, on Claritin '10mg'$  qd, Atrovent nasal spray.

## 2021-07-29 ENCOUNTER — Encounter: Payer: Self-pay | Admitting: Nurse Practitioner

## 2021-08-01 ENCOUNTER — Other Ambulatory Visit: Payer: Self-pay

## 2021-08-01 ENCOUNTER — Non-Acute Institutional Stay: Payer: PPO | Admitting: Nurse Practitioner

## 2021-08-01 ENCOUNTER — Encounter: Payer: Self-pay | Admitting: Nurse Practitioner

## 2021-08-01 DIAGNOSIS — I10 Essential (primary) hypertension: Secondary | ICD-10-CM | POA: Diagnosis not present

## 2021-08-01 DIAGNOSIS — K219 Gastro-esophageal reflux disease without esophagitis: Secondary | ICD-10-CM | POA: Diagnosis not present

## 2021-08-01 DIAGNOSIS — R7303 Prediabetes: Secondary | ICD-10-CM | POA: Diagnosis not present

## 2021-08-01 DIAGNOSIS — R7989 Other specified abnormal findings of blood chemistry: Secondary | ICD-10-CM

## 2021-08-01 DIAGNOSIS — M8580 Other specified disorders of bone density and structure, unspecified site: Secondary | ICD-10-CM | POA: Diagnosis not present

## 2021-08-01 DIAGNOSIS — F419 Anxiety disorder, unspecified: Secondary | ICD-10-CM | POA: Diagnosis not present

## 2021-08-01 DIAGNOSIS — Z780 Asymptomatic menopausal state: Secondary | ICD-10-CM

## 2021-08-01 DIAGNOSIS — M545 Low back pain, unspecified: Secondary | ICD-10-CM

## 2021-08-01 DIAGNOSIS — E782 Mixed hyperlipidemia: Secondary | ICD-10-CM | POA: Diagnosis not present

## 2021-08-01 DIAGNOSIS — J309 Allergic rhinitis, unspecified: Secondary | ICD-10-CM | POA: Diagnosis not present

## 2021-08-01 NOTE — Assessment & Plan Note (Signed)
stable on Omeprazole 40mg  qd. Hgb 11.7 06/10/21

## 2021-08-01 NOTE — Assessment & Plan Note (Signed)
Allergic rhinitis, off Fluticasone nasal spray qd, on Claritin '10mg'$  qd, Atrovent nasal spray.

## 2021-08-01 NOTE — Assessment & Plan Note (Addendum)
OP declined Alendronate after initial consented.  

## 2021-08-01 NOTE — Progress Notes (Signed)
Location:   clinic [place]   Place of Service:  Clinic (12) Provider: Marlana Latus NP  Code Status: DNR Goals of Care: [place] 08/01/2021  Does Patient Have a Medical Advance Directive? Yes  Type of Paramedic of [place];Living will;Out of facility DNR (pink MOST or yellow form)  Does patient want to make changes to medical advance directive? No - Patient declined  Copy of [place] in Chart? Yes - validated most recent copy scanned in chart (See row information)  Would patient like information on creating a medical advance directive? -  Pre-existing out of facility DNR order (yellow form or pink MOST form) Pink MOST form placed in chart (order not valid for inpatient use)     Chief Complaint  Patient presents with   Medical Management of Chronic Issues    3 month follow up      HPI: Patient is a 87 y.o. female seen today for medical management of chronic diseases.    OP declined Alendronate after initial consented.  Back pain mid to lower portion, better, X-ray T/L spine showed multilevel degenerative changes, no acute findings CT head/C+T+L spine, and pelvis             Hx of lower back pain, chronic, taking Tylenol, Tramadol.             Allergic rhinitis, off Fluticasone nasal spray qd, on Claritin '10mg'$  qd, Atrovent nasal spray.              Anxiety, Lorazepam prn available to her. TSH 5.04 02/12/21             Elevated TSH 5.04 02/12/21, TSH 5.07 06/06/21             HTN, takes  Losartan/HCT 100/12.'5mg'$  qd, Metoprolol '25mg'$  qd since 02/07/21, takes ASA, Atorvastatin. Bun/creat 28/0.87 06/10/21             GERD, stable on Omeprazole '40mg'$  qd. Hgb 11.7 06/10/21             Hyperlipidemia, takes Atorvastatin, LDL 67 09/24/20             Prediabetes Hgb a1c 5.7 09/24/20, 5.7 06/06/21      Past Medical History:  Diagnosis Date   GERD (gastroesophageal reflux disease)    Hx of cataract surgery    both eyes   Hyperglycemia     Hyperlipidemia    Hypertension    Lumbago 04/05/2016   Macular degeneration    ? which eye(s)   Obstructive sleep apnea    Osteoporosis, senile    Overactive bladder    Squamous cell skin cancer 2/16   left lower leg    Past Surgical History:  Procedure Laterality Date   ABDOMINAL HYSTERECTOMY  1960's   APPENDECTOMY  1960's   MOLE REMOVAL  2000   facial   TONSILLECTOMY      Allergies  Allergen Reactions   Adhesive [Tape]    Ampicillin    Elemental Sulfur    Nitrofuran Derivatives    Zoloft [Sertraline Hcl] Other (See Comments)    Dizziness, nausea, faint feeling, and nightmares      Allergies as of 08/01/2021       Reactions   Adhesive [tape]    Ampicillin    Elemental Sulfur    Nitrofuran Derivatives    Zoloft [sertraline Hcl] Other (See Comments)   Dizziness, nausea, faint feeling, and nightmares  Medication List        Accurate as of August 01, 2021  3:07 PM. If you have any questions, ask your nurse or doctor.          STOP taking these medications    ipratropium 0.03 % nasal spray Commonly known as: ATROVENT Stopped by: Billye Nydam X Fawaz Borquez, NP   loratadine 10 MG tablet Commonly known as: CLARITIN Stopped by: Beda Dula X Arnulfo Batson, NP       TAKE these medications    acetaminophen 500 MG tablet Commonly known as: TYLENOL Take 500 mg by mouth daily.   alendronate 70 MG tablet Commonly known as: Fosamax Take 1 tablet (70 mg total) by mouth once a week. Take with a full glass of water on an empty stomach.   aspirin EC 81 MG tablet Take 1 tablet (81 mg total) by mouth daily.   atorvastatin 20 MG tablet Commonly known as: LIPITOR TAKE 1 TABLET ONCE DAILY.   Calcium Carb-Cholecalciferol 600-800 MG-UNIT Tabs Take 1 tablet by mouth 2 (two) times daily.   fluticasone 50 MCG/ACT nasal spray Commonly known as: FLONASE Place 1 spray into both nostrils daily. Each nostril   LORazepam 0.5 MG tablet Commonly known as: ATIVAN Take 0.5 mg by mouth daily  as needed for anxiety.   losartan-hydrochlorothiazide 100-12.5 MG tablet Commonly known as: HYZAAR TAKE (1) TABLET DAILY FOR HIGH BLOOD PRESSURE.   metoprolol succinate 25 MG 24 hr tablet Commonly known as: TOPROL-XL Take 0.5 tablets (12.5 mg total) by mouth daily.   mupirocin cream 2 % Commonly known as: Bactroban Apply 1 application topically daily.   omeprazole 40 MG capsule Commonly known as: PRILOSEC TAKE 1 CAPSULE DAILY 1/2 HOUR BEFORE BREAKFAST FOR STOMACH ACID.   Systane 0.4-0.3 % Gel ophthalmic gel Generic drug: Polyethyl Glycol-Propyl Glycol Place 1 application into both eyes daily.   SYSTANE ULTRA OP Apply to eye in the morning, at noon, and at bedtime.   traMADol 50 MG tablet Commonly known as: ULTRAM Take 50 mg by mouth as needed.   TRIAMCINOLONE ACETONIDE(NASAL) NA Place 2 sprays into the nose. Each nostril        Review of Systems:  Review of Systems  Constitutional:  Negative for appetite change, fatigue and fever.  HENT:  Negative for congestion, hearing loss, trouble swallowing and voice change.        Improved.   Eyes:  Negative for visual disturbance.  Respiratory:  Negative for cough and shortness of breath.   Cardiovascular:  Negative for leg swelling.  Gastrointestinal:  Negative for abdominal pain and constipation.       X1-2 times in the past 3 months, diet adjustment helped.   Genitourinary:  Negative for dysuria and urgency.       0-1x/night urination, occasionally urinary leakage.   Musculoskeletal:  Positive for arthralgias, back pain and gait problem.  Skin:  Negative for color change.  Neurological:  Negative for speech difficulty, weakness and light-headedness.       Tingling in toes sometimes, better after moves around  Psychiatric/Behavioral:  Negative for confusion and sleep disturbance. The patient is not nervous/anxious.    Health Maintenance  Topic Date Due   Zoster Vaccines- Shingrix (1 of 2) Never done   COVID-19 Vaccine  (4 - Booster for Moderna series) 02/06/2021   INFLUENZA VACCINE  07/29/2021   TETANUS/TDAP  12/14/2022   DEXA SCAN  Completed   PNA vac Low Risk Adult  Completed   HPV VACCINES  Aged Out  Physical Exam: Vitals:   08/01/21 1446  BP: (!) 146/80  Pulse: 87  Resp: 18  Temp: (!) 97.3 F (36.3 C)  SpO2: 97%  Weight: 176 lb 3.2 oz (79.9 kg)  Height: '5\' 6"'$  (1.676 m)   Body mass index is 28.44 kg/m. Physical Exam Vitals and nursing note reviewed.  Constitutional:      Appearance: Normal appearance.  HENT:     Head: Normocephalic and atraumatic.     Mouth/Throat:     Mouth: Mucous membranes are moist.  Eyes:     Extraocular Movements: Extraocular movements intact.     Conjunctiva/sclera: Conjunctivae normal.     Pupils: Pupils are equal, round, and reactive to light.  Cardiovascular:     Rate and Rhythm: Normal rate and regular rhythm.     Heart sounds: No murmur heard. Pulmonary:     Effort: Pulmonary effort is normal.     Breath sounds: Normal breath sounds. No rales.  Abdominal:     General: Bowel sounds are normal.     Palpations: Abdomen is soft.     Tenderness: There is no abdominal tenderness.  Musculoskeletal:     Cervical back: Normal range of motion and neck supple.     Right lower leg: No edema.     Left lower leg: No edema.  Skin:    General: Skin is warm and dry.     Comments: The left gluteal sulcus region skin abrasion-carpet burn is healed.   Neurological:     General: No focal deficit present.     Mental Status: She is alert and oriented to person, place, and time. Mental status is at baseline.     Gait: Gait abnormal.  Psychiatric:        Mood and Affect: Mood normal.        Behavior: Behavior normal.        Thought Content: Thought content normal.        Judgment: Judgment normal.    Labs reviewed: Basic Metabolic Panel: Recent Labs    02/12/21 0700 06/03/21 1637 06/06/21 0700 06/10/21 1046  NA 143 141  --  143  K 3.9 3.4*  --  4.0   CL 106 105  --  106  CO2 28 28  --  26  GLUCOSE 106* 93  --  108*  BUN 25 27*  --  28*  CREATININE 0.86 0.72  --  0.87  CALCIUM 9.6 9.9  --  10.2  TSH 5.04*  --  5.07* 3.43   Liver Function Tests: Recent Labs    09/24/20 0858 02/12/21 0700 06/10/21 1046  AST '22 20 25  '$ ALT '14 16 22  '$ BILITOT 0.6 0.6 0.4  PROT 6.0* 6.1 6.1   No results for input(s): LIPASE, AMYLASE in the last 8760 hours. No results for input(s): AMMONIA in the last 8760 hours. CBC: Recent Labs    09/24/20 0858 02/12/21 0700 06/03/21 1637 06/10/21 1046  WBC 6.8 8.2 11.5* 9.2  NEUTROABS 3,794 4,936  --  6,127  HGB 12.7 12.2 11.9* 11.7  HCT 38.1 37.1 36.8 36.0  MCV 95.0 93.7 95.8 95.0  PLT 188 191 174 213   Lipid Panel: Recent Labs    09/24/20 0858  CHOL 135  HDL 54  LDLCALC 67  TRIG 63  CHOLHDL 2.5   Lab Results  Component Value Date   HGBA1C 5.6 06/06/2021    Procedures since last visit: DG Bone Density  Result Date: 07/16/2021 EXAM: DUAL X-RAY  ABSORPTIOMETRY (DXA) FOR BONE MINERAL DENSITY IMPRESSION: Referring Physician:  Lauree Chandler Your patient completed a bone mineral density test using GE Lunar iDXA system (analysis version: 16). Technologist: [place] PATIENT: Name: Yoshi, Akita Patient ID: [id number] Birth Date: 01/09/1924 Height: 62.0 in. Sex: Female Measured: 07/16/2021 Weight: 174.0 lbs. Indications: Advanced Age, Caucasian, Estrogen Deficient, Height Loss (781.91), Hysterectomy, Omeprazole, Postmenopausal Fractures: None Treatments: Calcium (E943.0) ASSESSMENT: The BMD measured at Femur Neck Right is 0.804 g/cm2 with a T-score of -1.7. This patient is considered osteopenic/low bone mass according to [facility]) criteria. The quality of the exam is good. The lumbar spine was excluded due to degenerative changes. Patient is not a candidate for FRAX due to age. Site Region Measured Date Measured Age YA BMD Significant CHANGE T-score DualFemur Neck Right 07/16/2021 95.4  -1.7 0.804 g/cm2 * DualFemur Neck Right 09/21/2018 92.6 -1.3 0.862 g/cm2 DualFemur Total Mean 07/16/2021 95.4 -1.0 0.877 g/cm2 * DualFemur Total Mean 09/21/2018 92.6 -0.8 0.905 g/cm2 Left Forearm Radius 33% 07/16/2021 95.4 -0.1 0.875 g/cm2 Left Forearm Radius 33% 09/21/2018 92.6 0.1 0.895 g/cm2 World Health Organization [facility]) criteria for post-menopausal, Caucasian Women: Normal       T-score at or above -1 SD Osteopenia   T-score between -1 and -2.5 SD Osteoporosis T-score at or below -2.5 SD RECOMMENDATION: 1. All patients should optimize calcium and vitamin D intake. 2. Consider FDA-approved medical therapies in postmenopausal women and men aged 83 years and older, based on the following: a. A hip or vertebral (clinical or morphometric) fracture. b. T-score = -2.5 at the femoral neck or spine after appropriate evaluation to exclude secondary causes. c. Low bone mass (T-score between -1.0 and -2.5 at the femoral neck or spine) and a 10-year probability of a hip fracture = 3% or a 10-year probability of a major osteoporosis-related fracture = 20% based on the [place]-adapted WHO algorithm. d. Clinician judgment and/or patient preferences may indicate treatment for people with 10-year fracture probabilities above or below these levels. FOLLOW-UP: Patients with diagnosis of osteoporosis or at high risk for fracture should have regular bone mineral density tests.? Patients eligible for Medicare are allowed routine testing every 2 years.? The testing frequency can be increased to one year for patients who have rapidly progressing disease, are receiving or discontinuing medical therapy to restore bone mass, or have additional risk factors. I have reviewed this study and agree with the findings. Mark A. Thornton Papas, M.D. [facility] Electronically Signed   By: Lavonia Dana M.D.   On: 07/16/2021 16:50    Assessment/Plan  Osteopenia after menopause OP declined Alendronate after initial consented.   Lumbago Back  pain mid to lower portion, better, X-ray T/L spine showed multilevel degenerative changes, no acute findings CT head/C+T+L spine, and pelvis             Hx of lower back pain, chronic, taking Tylenol, Tramadol.  Allergic rhinitis Allergic rhinitis, off Fluticasone nasal spray qd, on Claritin '10mg'$  qd, Atrovent nasal spray.   Anxiety Lorazepam prn available to her. TSH 5.04 02/12/21  Elevated TSH Mildly elevated TSH 5.04 02/12/21, TSH 5.07 06/06/21  HTN (hypertension) Blood pressure is controlled. takes  Losartan/HCT 100/12.'5mg'$  qd, Metoprolol '25mg'$  qd since 02/07/21, takes ASA, Atorvastatin. Bun/creat 28/0.87 06/10/21  GERD (gastroesophageal reflux disease) stable on Omeprazole '40mg'$  qd. Hgb 11.7 06/10/21  Hyperlipidemia  takes Atorvastatin, LDL 67 09/24/20  Prediabetes Prediabetes Hgb a1c 5.7 09/24/20, 5.7 06/06/21   Labs/tests ordered:  none  Next appt:  3 months.

## 2021-08-01 NOTE — Assessment & Plan Note (Signed)
Blood pressure is controlled. takes  Losartan/HCT 100/12.'5mg'$  qd, Metoprolol '25mg'$  qd since 02/07/21, takes ASA, Atorvastatin. Bun/creat 28/0.87 06/10/21

## 2021-08-01 NOTE — Assessment & Plan Note (Signed)
Lorazepam prn available to her. TSH 5.04 02/12/21

## 2021-08-01 NOTE — Assessment & Plan Note (Signed)
Prediabetes Hgb a1c 5.7 09/24/20, 5.7 06/06/21

## 2021-08-01 NOTE — Assessment & Plan Note (Signed)
Mildly elevated TSH 5.04 02/12/21, TSH 5.07 06/06/21

## 2021-08-01 NOTE — Assessment & Plan Note (Signed)
Back pain mid to lower portion, better, X-ray T/L spine showed multilevel degenerative changes, no acute findings CT head/C+T+L spine, and pelvis             Hx of lower back pain, chronic, taking Tylenol, Tramadol.

## 2021-08-01 NOTE — Assessment & Plan Note (Signed)
takes Atorvastatin, LDL 67 09/24/20

## 2021-08-11 ENCOUNTER — Other Ambulatory Visit: Payer: Self-pay | Admitting: Internal Medicine

## 2021-08-12 NOTE — Telephone Encounter (Signed)
Refill request received. Medication pended and sent to Man X Mast, NP for approval.

## 2021-08-15 ENCOUNTER — Other Ambulatory Visit: Payer: Self-pay | Admitting: Nurse Practitioner

## 2021-09-30 ENCOUNTER — Other Ambulatory Visit: Payer: Self-pay | Admitting: Nurse Practitioner

## 2021-09-30 NOTE — Telephone Encounter (Signed)
Called patient, no answer and no voicemail. Reason for call was to inform patient she is due for an appointment next month per last visit notes.

## 2021-10-02 DIAGNOSIS — G4733 Obstructive sleep apnea (adult) (pediatric): Secondary | ICD-10-CM | POA: Diagnosis not present

## 2021-10-02 DIAGNOSIS — N8110 Cystocele, unspecified: Secondary | ICD-10-CM | POA: Diagnosis not present

## 2021-10-10 ENCOUNTER — Other Ambulatory Visit: Payer: Self-pay | Admitting: Nurse Practitioner

## 2021-10-15 DIAGNOSIS — L565 Disseminated superficial actinic porokeratosis (DSAP): Secondary | ICD-10-CM | POA: Diagnosis not present

## 2021-10-15 DIAGNOSIS — L570 Actinic keratosis: Secondary | ICD-10-CM | POA: Diagnosis not present

## 2021-10-15 DIAGNOSIS — Z85828 Personal history of other malignant neoplasm of skin: Secondary | ICD-10-CM | POA: Diagnosis not present

## 2021-10-15 DIAGNOSIS — L821 Other seborrheic keratosis: Secondary | ICD-10-CM | POA: Diagnosis not present

## 2021-10-15 DIAGNOSIS — L820 Inflamed seborrheic keratosis: Secondary | ICD-10-CM | POA: Diagnosis not present

## 2021-10-15 DIAGNOSIS — L814 Other melanin hyperpigmentation: Secondary | ICD-10-CM | POA: Diagnosis not present

## 2021-11-12 ENCOUNTER — Other Ambulatory Visit: Payer: Self-pay | Admitting: Nurse Practitioner

## 2021-11-22 ENCOUNTER — Other Ambulatory Visit: Payer: Self-pay | Admitting: Nurse Practitioner

## 2021-11-28 ENCOUNTER — Encounter: Payer: Self-pay | Admitting: Nurse Practitioner

## 2021-11-28 ENCOUNTER — Other Ambulatory Visit: Payer: Self-pay

## 2021-11-28 ENCOUNTER — Non-Acute Institutional Stay: Payer: PPO | Admitting: Nurse Practitioner

## 2021-11-28 VITALS — BP 130/70 | HR 88 | Temp 97.9°F | Ht 66.0 in | Wt 179.0 lb

## 2021-11-28 DIAGNOSIS — K219 Gastro-esophageal reflux disease without esophagitis: Secondary | ICD-10-CM

## 2021-11-28 DIAGNOSIS — I10 Essential (primary) hypertension: Secondary | ICD-10-CM | POA: Diagnosis not present

## 2021-11-28 DIAGNOSIS — J309 Allergic rhinitis, unspecified: Secondary | ICD-10-CM

## 2021-11-28 DIAGNOSIS — R7989 Other specified abnormal findings of blood chemistry: Secondary | ICD-10-CM

## 2021-11-28 DIAGNOSIS — F5105 Insomnia due to other mental disorder: Secondary | ICD-10-CM

## 2021-11-28 DIAGNOSIS — R7303 Prediabetes: Secondary | ICD-10-CM

## 2021-11-28 DIAGNOSIS — M545 Low back pain, unspecified: Secondary | ICD-10-CM | POA: Diagnosis not present

## 2021-11-28 DIAGNOSIS — F419 Anxiety disorder, unspecified: Secondary | ICD-10-CM

## 2021-11-28 DIAGNOSIS — E782 Mixed hyperlipidemia: Secondary | ICD-10-CM

## 2021-11-28 NOTE — Assessment & Plan Note (Signed)
Hgb a1c 5.7 09/24/20, 5.7 06/06/21. Will update CBC/diff, CMP/eGFR, lipid panel, TSH, Hgb a1c

## 2021-11-28 NOTE — Assessment & Plan Note (Signed)
takes Atorvastatin, LDL 67 09/24/20

## 2021-11-28 NOTE — Progress Notes (Signed)
Location:   clinic [place]   Place of Service:  Clinic (12) Provider: Marlana Latus NP  Code Status: DNR Goals of Care: [place] 11/28/2021  Does Patient Have a Medical Advance Directive? Yes  Type of Paramedic of [place];Living will;Out of facility DNR (pink MOST or yellow form)  Does patient want to make changes to medical advance directive? No - Patient declined  Copy of [place] in Chart? Yes - validated most recent copy scanned in chart (See row information)  Would patient like information on creating a medical advance directive? -  Pre-existing out of facility DNR order (yellow form or pink MOST form) Pink MOST form placed in chart (order not valid for inpatient use)     Chief Complaint  Patient presents with   Medical Management of Chronic Issues    3 month follow-up. Patient also c/o not feeling well related to headaches. Need to discuss fosamax, patient is not taking and unsure why or when she stopped taking medication. Discuss need for shingrix and covid vaccines or exclude if patient refuses.      HPI: Patient is a 87 y.o. female seen today for medical management of chronic diseases.     OP declined Alendronate after initial consented.  Back pain mid to lower portion, better, X-ray T/L spine showed multilevel degenerative changes, no acute findings CT head/C+T+L spine, and pelvis             Hx of lower back pain, chronic, taking Tylenol, Tramadol.             Allergic rhinitis, takes nasal spray             Anxiety, Lorazepam prn available to her. TSH 5.04 02/12/21             Elevated TSH 5.04 02/12/21, TSH 5.07 06/06/21             HTN, takes  Losartan/HCT, Metoprolol, ASA, Atorvastatin. Bun/creat 28/0.87 06/10/21             GERD, stable on Omeprazole 54m qd. Hgb 11.7 06/10/21             Hyperlipidemia, takes Atorvastatin, LDL 67 09/24/20             Prediabetes Hgb a1c 5.7 09/24/20, 5.7 06/06/21     Past Medical  History:  Diagnosis Date   GERD (gastroesophageal reflux disease)    Hx of cataract surgery    both eyes   Hyperglycemia    Hyperlipidemia    Hypertension    Lumbago 04/05/2016   Macular degeneration    ? which eye(s)   Obstructive sleep apnea    Osteoporosis, senile    Overactive bladder    Squamous cell skin cancer 2/16   left lower leg    Past Surgical History:  Procedure Laterality Date   ABDOMINAL HYSTERECTOMY  1960's   APPENDECTOMY  1960's   MOLE REMOVAL  2000   facial   TONSILLECTOMY      Allergies  Allergen Reactions   Adhesive [Tape]    Ampicillin    Elemental Sulfur    Nitrofuran Derivatives    Zoloft [Sertraline Hcl] Other (See Comments)    Dizziness, nausea, faint feeling, and nightmares      Allergies as of 11/28/2021       Reactions   Adhesive [tape]    Ampicillin    Elemental Sulfur    Nitrofuran Derivatives    Zoloft [  sertraline Hcl] Other (See Comments)   Dizziness, nausea, faint feeling, and nightmares          Medication List        Accurate as of November 28, 2021 11:59 PM. If you have any questions, ask your nurse or doctor.          STOP taking these medications    fluticasone 50 MCG/ACT nasal spray Commonly known as: FLONASE Stopped by: Annielee Jemmott X Jeanean Hollett, NP       TAKE these medications    acetaminophen 500 MG tablet Commonly known as: TYLENOL Take 500 mg by mouth daily.   alendronate 70 MG tablet Commonly known as: Fosamax Take 1 tablet (70 mg total) by mouth once a week. Take with a full glass of water on an empty stomach.   aspirin EC 81 MG tablet Take 1 tablet (81 mg total) by mouth daily.   atorvastatin 20 MG tablet Commonly known as: LIPITOR TAKE ONE TABLET BY MOUTH ONCE DAILY   Calcium Carb-Cholecalciferol 600-800 MG-UNIT Tabs Take 1 tablet by mouth 2 (two) times daily.   loratadine 10 MG tablet Commonly known as: CLARITIN Take 10 mg by mouth daily.   LORazepam 0.5 MG tablet Commonly known as:  ATIVAN TAKE ONE TABLET ONCE DAILY AS NEEDED FOR ANXIETY.   losartan-hydrochlorothiazide 100-12.5 MG tablet Commonly known as: HYZAAR Take 1 tablet by mouth daily. For High Blood pressure APPOINTMENT DUE IN November   metoprolol succinate 25 MG 24 hr tablet Commonly known as: TOPROL-XL Take 0.5 tablets (12.5 mg total) by mouth daily.   mupirocin cream 2 % Commonly known as: Bactroban Apply 1 application topically daily.   omeprazole 40 MG capsule Commonly known as: PRILOSEC TAKE 1 CAPSULE DAILY 1/2 HOUR BEFORE BREAKFAST FOR STOMACH ACID.   Systane 0.4-0.3 % Gel ophthalmic gel Generic drug: Polyethyl Glycol-Propyl Glycol Place 1 application into both eyes daily.   SYSTANE ULTRA OP Apply to eye in the morning, at noon, and at bedtime.   traMADol 50 MG tablet Commonly known as: ULTRAM Take 50 mg by mouth every 6 (six) hours as needed.   TRIAMCINOLONE ACETONIDE(NASAL) NA Place 2 sprays into the nose. Each nostril        Review of Systems:  Review of Systems  Constitutional:  Negative for appetite change, fatigue and fever.  HENT:  Negative for congestion, hearing loss, trouble swallowing and voice change.        Improved.   Eyes:  Negative for visual disturbance.  Respiratory:  Negative for cough and shortness of breath.   Cardiovascular:  Negative for leg swelling.  Gastrointestinal:  Negative for abdominal pain and constipation.       X1-2 times in the past 3 months, diet adjustment helped.   Genitourinary:  Negative for dysuria and urgency.       0-1x/night urination, occasionally urinary leakage.   Musculoskeletal:  Positive for arthralgias, back pain and gait problem.  Skin:  Negative for color change.  Neurological:  Negative for speech difficulty, weakness and light-headedness.       Tingling in toes sometimes, better after moves around  Psychiatric/Behavioral:  Negative for confusion and sleep disturbance. The patient is not nervous/anxious.    Health  Maintenance  Topic Date Due   Zoster Vaccines- Shingrix (1 of 2) Never done   COVID-19 Vaccine (4 - Booster for Moderna series) 01/01/2021   TETANUS/TDAP  12/14/2022   Pneumonia Vaccine 67+ Years old  Completed   INFLUENZA VACCINE  Completed  DEXA SCAN  Completed   HPV VACCINES  Aged Out    Physical Exam: Vitals:   11/28/21 1444  BP: 130/70  Pulse: 88  Temp: 97.9 F (36.6 C)  SpO2: 98%  Weight: 179 lb (81.2 kg)  Height: '5\' 6"'  (1.676 m)   Body mass index is 28.89 kg/m. Physical Exam Vitals and nursing note reviewed.  Constitutional:      Appearance: Normal appearance.  HENT:     Head: Normocephalic and atraumatic.     Mouth/Throat:     Mouth: Mucous membranes are moist.  Eyes:     Extraocular Movements: Extraocular movements intact.     Conjunctiva/sclera: Conjunctivae normal.     Pupils: Pupils are equal, round, and reactive to light.  Cardiovascular:     Rate and Rhythm: Normal rate and regular rhythm.     Heart sounds: No murmur heard. Pulmonary:     Effort: Pulmonary effort is normal.     Breath sounds: Normal breath sounds. No rales.  Abdominal:     General: Bowel sounds are normal.     Palpations: Abdomen is soft.     Tenderness: There is no abdominal tenderness.  Musculoskeletal:     Cervical back: Normal range of motion and neck supple.     Right lower leg: No edema.     Left lower leg: No edema.  Skin:    General: Skin is warm and dry.     Comments: The left gluteal sulcus region skin abrasion-carpet burn is healed.   Neurological:     General: No focal deficit present.     Mental Status: She is alert and oriented to person, place, and time. Mental status is at baseline.     Gait: Gait abnormal.  Psychiatric:        Mood and Affect: Mood normal.        Behavior: Behavior normal.        Thought Content: Thought content normal.        Judgment: Judgment normal.    Labs reviewed: Basic Metabolic Panel: Recent Labs    02/12/21 0700  06/03/21 1637 06/06/21 0700 06/10/21 1046  NA 143 141  --  143  K 3.9 3.4*  --  4.0  CL 106 105  --  106  CO2 28 28  --  26  GLUCOSE 106* 93  --  108*  BUN 25 27*  --  28*  CREATININE 0.86 0.72  --  0.87  CALCIUM 9.6 9.9  --  10.2  TSH 5.04*  --  5.07* 3.43   Liver Function Tests: Recent Labs    02/12/21 0700 06/10/21 1046  AST 20 25  ALT 16 22  BILITOT 0.6 0.4  PROT 6.1 6.1   No results for input(s): LIPASE, AMYLASE in the last 8760 hours. No results for input(s): AMMONIA in the last 8760 hours. CBC: Recent Labs    02/12/21 0700 06/03/21 1637 06/10/21 1046  WBC 8.2 11.5* 9.2  NEUTROABS 4,936  --  6,127  HGB 12.2 11.9* 11.7  HCT 37.1 36.8 36.0  MCV 93.7 95.8 95.0  PLT 191 174 213   Lipid Panel: No results for input(s): CHOL, HDL, LDLCALC, TRIG, CHOLHDL, LDLDIRECT in the last 8760 hours. Lab Results  Component Value Date   HGBA1C 5.6 06/06/2021    Procedures since last visit: No results found.  Assessment/Plan  Lumbago lower back pain/leg, chronic, taking Tylenol, Tramadol. Desires to work with therapy.   Allergic rhinitis Allergic rhinitis, takes  nasal spray  Insomnia secondary to anxiety Lorazepam prn available to her. TSH 5.04 02/12/21  Elevated TSH TSH 5.04 02/12/21, TSH 5.07 06/06/21  HTN (hypertension) Blood pressure is controlled  takes  Losartan/HCT, Metoprolol, ASA, Atorvastatin. Bun/creat 28/0.87 06/10/21  GERD (gastroesophageal reflux disease) stable on Omeprazole 74m qd. Hgb 11.7 06/10/21  Hyperlipidemia takes Atorvastatin, LDL 67 09/24/20  Prediabetes Hgb a1c 5.7 09/24/20, 5.7 06/06/21. Will update CBC/diff, CMP/eGFR, lipid panel, TSH, Hgb a1c   Labs/tests ordered:  CBC/diff, CMP/eGFR, lipid panel, Hgb a1c, TSH 6 moths.   Next appt:  3 months.

## 2021-11-28 NOTE — Assessment & Plan Note (Signed)
Lorazepam prn available to her. TSH 5.04 02/12/21

## 2021-11-28 NOTE — Assessment & Plan Note (Signed)
TSH 5.04 02/12/21, TSH 5.07 06/06/21

## 2021-11-28 NOTE — Assessment & Plan Note (Signed)
Blood pressure is controlled  takes  Losartan/HCT, Metoprolol, ASA, Atorvastatin. Bun/creat 28/0.87 06/10/21

## 2021-11-28 NOTE — Assessment & Plan Note (Signed)
Allergic rhinitis, takes nasal spray

## 2021-11-28 NOTE — Assessment & Plan Note (Signed)
stable on Omeprazole 40mg  qd. Hgb 11.7 06/10/21

## 2021-11-28 NOTE — Assessment & Plan Note (Addendum)
lower back pain/leg, chronic, taking Tylenol, Tramadol. Desires to work with therapy.

## 2021-11-29 ENCOUNTER — Encounter: Payer: Self-pay | Admitting: Nurse Practitioner

## 2021-12-03 ENCOUNTER — Other Ambulatory Visit: Payer: Self-pay | Admitting: Nurse Practitioner

## 2021-12-03 NOTE — Telephone Encounter (Signed)
[facility] called requesting refill.

## 2021-12-09 ENCOUNTER — Other Ambulatory Visit: Payer: Self-pay | Admitting: Nurse Practitioner

## 2022-01-05 ENCOUNTER — Encounter (HOSPITAL_COMMUNITY): Payer: Self-pay

## 2022-01-05 ENCOUNTER — Emergency Department (HOSPITAL_COMMUNITY): Payer: PPO

## 2022-01-05 ENCOUNTER — Other Ambulatory Visit: Payer: Self-pay

## 2022-01-05 ENCOUNTER — Emergency Department (HOSPITAL_COMMUNITY)
Admission: EM | Admit: 2022-01-05 | Discharge: 2022-01-05 | Disposition: A | Discharge status: Home / Self Care | Payer: PPO | Attending: Emergency Medicine | Admitting: Emergency Medicine

## 2022-01-05 DIAGNOSIS — R509 Fever, unspecified: Secondary | ICD-10-CM | POA: Diagnosis not present

## 2022-01-05 DIAGNOSIS — R0789 Other chest pain: Secondary | ICD-10-CM

## 2022-01-05 DIAGNOSIS — Z79899 Other long term (current) drug therapy: Secondary | ICD-10-CM | POA: Insufficient documentation

## 2022-01-05 DIAGNOSIS — R079 Chest pain, unspecified: Secondary | ICD-10-CM | POA: Diagnosis not present

## 2022-01-05 DIAGNOSIS — I517 Cardiomegaly: Secondary | ICD-10-CM | POA: Diagnosis not present

## 2022-01-05 DIAGNOSIS — J90 Pleural effusion, not elsewhere classified: Secondary | ICD-10-CM | POA: Diagnosis not present

## 2022-01-05 DIAGNOSIS — J9811 Atelectasis: Secondary | ICD-10-CM | POA: Diagnosis not present

## 2022-01-05 DIAGNOSIS — R52 Pain, unspecified: Secondary | ICD-10-CM

## 2022-01-05 DIAGNOSIS — R0781 Pleurodynia: Secondary | ICD-10-CM | POA: Diagnosis not present

## 2022-01-05 DIAGNOSIS — Z7982 Long term (current) use of aspirin: Secondary | ICD-10-CM | POA: Insufficient documentation

## 2022-01-05 DIAGNOSIS — Z743 Need for continuous supervision: Secondary | ICD-10-CM | POA: Diagnosis not present

## 2022-01-05 DIAGNOSIS — I959 Hypotension, unspecified: Secondary | ICD-10-CM | POA: Diagnosis not present

## 2022-01-05 LAB — TROPONIN I (HIGH SENSITIVITY): Troponin I (High Sensitivity): 7 ng/L (ref <18)

## 2022-01-05 LAB — CBC
HCT: 35.9 % — ABNORMAL LOW (ref 36.0–46.0)
Hemoglobin: 11.5 g/dL — ABNORMAL LOW (ref 12.0–15.0)
MCH: 30.7 pg (ref 26.0–34.0)
MCHC: 32 g/dL (ref 30.0–36.0)
MCV: 95.7 fL (ref 80.0–100.0)
Platelets: 166 10*3/uL (ref 150–400)
RBC: 3.75 MIL/uL — ABNORMAL LOW (ref 3.87–5.11)
RDW: 14.3 % (ref 11.5–15.5)
WBC: 11.4 10*3/uL — ABNORMAL HIGH (ref 4.0–10.5)
nRBC: 0 % (ref 0.0–0.2)

## 2022-01-05 LAB — BASIC METABOLIC PANEL
Anion gap: 9 (ref 5–15)
BUN: 23 mg/dL (ref 8–23)
CO2: 29 mmol/L (ref 22–32)
Calcium: 9.4 mg/dL (ref 8.9–10.3)
Chloride: 103 mmol/L (ref 98–111)
Creatinine, Ser: 0.72 mg/dL (ref 0.44–1.00)
GFR, Estimated: >60 mL/min (ref >60)
Glucose, Bld: 112 mg/dL — ABNORMAL HIGH (ref 70–99)
Potassium: 3.4 mmol/L — ABNORMAL LOW (ref 3.5–5.1)
Sodium: 141 mmol/L (ref 135–145)

## 2022-01-05 MED ORDER — TRAMADOL HCL 50 MG PO TABS
50.0000 mg | ORAL_TABLET | Freq: Once | ORAL | Status: AC
  Administered 2022-01-05: 50 mg via ORAL
  Filled 2022-01-05: qty 1

## 2022-01-05 MED ORDER — HYDROMORPHONE HCL 1 MG/ML IJ SOLN
0.5000 mg | Freq: Once | INTRAMUSCULAR | Status: AC
  Administered 2022-01-05: 0.5 mg via SUBCUTANEOUS
  Filled 2022-01-05: qty 1

## 2022-01-05 NOTE — Discharge Instructions (Signed)
It is not clear what is causing your chest discomfort.  It will probably help to use a heating pad on the sore area 3-4 times a day, and take your tramadol, 4 times a day for pain.  See your doctor if not better in 2 or 3 days or having other problems or concerns.

## 2022-01-05 NOTE — ED Triage Notes (Signed)
Pt arrived via EMS from [facility]. Pt has a sharp pain in her ribs under her right breast. Pt reports that the pain is worse when she lifts her arms up. Pt denies dizziness, lightheadedness, or shortness of breath. Pt reports her pain began about 8 hours ago.

## 2022-01-05 NOTE — ED Provider Notes (Signed)
[facility] DEPT Provider Note   CSN: [id number] Arrival date & time: 01/05/22  1620     History  Chief Complaint  Patient presents with   Chest Pain    Jean [name] is a 88 y.o. female.  HPI She reports onset of right-sided chest discomfort today without trauma.  She denies coughing, fever, chills, shortness of breath, weakness or dizziness.  She has a DNR and a MOST form.  These are not in the EMR.  They are with her at the bedside.    Home Medications Prior to Admission medications   Medication Sig Start Date End Date Taking? Authorizing Provider  acetaminophen (TYLENOL) 500 MG tablet Take 500 mg by mouth daily.    Yes [provider]  aspirin EC 81 MG tablet Take 1 tablet (81 mg total) by mouth daily. 07/07/17  Yes Blanchie Serve, MD  atorvastatin (LIPITOR) 20 MG tablet TAKE ONE TABLET BY MOUTH ONCE DAILY Patient taking differently: Take 20 mg by mouth daily. 11/25/21  Yes Mast, Man X, NP  Calcium Carb-Cholecalciferol 600-800 MG-UNIT TABS Take 1 tablet by mouth 2 (two) times daily.    Yes [provider]  loratadine (CLARITIN) 10 MG tablet Take 10 mg by mouth daily.   Yes [provider]  losartan-hydrochlorothiazide (HYZAAR) 100-12.5 MG tablet Take one tablet by mouth once daily for high blood pressure. Patient taking differently: Take 1 tablet by mouth See admin instructions. Take one tablet by mouth once daily for high blood pressure. 12/03/21  Yes Mast, Man X, NP  metoprolol succinate (TOPROL-XL) 25 MG 24 hr tablet Take 0.5 tablets (12.5 mg total) by mouth daily. 07/05/21  Yes Mast, Man X, NP  Multiple Vitamins-Minerals (OCUVITE ADULT 50+) CAPS Take 1 capsule by mouth every evening.   Yes [provider]  omeprazole (PRILOSEC) 40 MG capsule TAKE 1 CAPSULE DAILY 1/2 HOUR BEFORE BREAKFAST FOR STOMACH ACID. Patient taking differently: Take 20 mg by mouth See admin instructions. TAKE 1 CAPSULE DAILY 1/2 HOUR BEFORE  BREAKFAST FOR STOMACH ACID. 10/10/21  Yes Mast, Man X, NP  Polyethyl Glycol-Propyl Glycol (SYSTANE) 0.4-0.3 % GEL ophthalmic gel Place 1 application into both eyes daily.   Yes [provider]  TRIAMCINOLONE ACETONIDE,NASAL, NA Place 2 sprays into the nose. Each nostril   Yes [provider]  alendronate (FOSAMAX) 70 MG tablet Take 1 tablet (70 mg total) by mouth once a week. Take with a full glass of water on an empty stomach. Patient not taking: Reported on 11/28/2021 07/25/21   Mast, Man X, NP  LORazepam (ATIVAN) 0.5 MG tablet TAKE ONE TABLET ONCE DAILY AS NEEDED FOR ANXIETY. Patient not taking: Reported on 01/05/2022 08/13/21   Mast, Man X, NP  mupirocin cream (BACTROBAN) 2 % Apply 1 application topically daily. Patient not taking: Reported on 01/05/2022 06/07/21   Mast, Man X, NP  traMADol (ULTRAM) 50 MG tablet Take 1 tablet (50 mg total) by mouth every 6 (six) hours as needed for up to 7 days. Patient not taking: Reported on 01/05/2022 12/09/21   Mast, Man X, NP      Allergies    Adhesive [tape], Ampicillin, Elemental sulfur, Nitrofuran derivatives, and Zoloft [sertraline hcl]    Review of Systems   Review of Systems  All other systems reviewed and are negative.  Physical Exam Updated Vital Signs BP (!) 123/50    Pulse 82    Temp 100.2 F (37.9 C) (Oral)    Resp 18  Ht 5\' 5"  (1.651 m)    Wt 81.2 kg    SpO2 96%    BMI 29.79 kg/m  Physical Exam Vitals and nursing note reviewed.  Constitutional:      Appearance: She is well-developed. She is not ill-appearing.  HENT:     Head: Normocephalic and atraumatic.     Right Ear: External ear normal.     Left Ear: External ear normal.  Eyes:     Conjunctiva/sclera: Conjunctivae normal.     Pupils: Pupils are equal, round, and reactive to light.  Neck:     Trachea: Phonation normal.  Cardiovascular:     Rate and Rhythm: Normal rate and regular rhythm.     Heart sounds: Normal heart sounds.  Pulmonary:     Effort:  Pulmonary effort is normal.     Breath sounds: Normal breath sounds.  Chest:     Chest wall: Tenderness (Exquisite diffuse right posterior and right posterolateral chest wall pain.  No associated crepitation, deformity or skin changes.) present.  Abdominal:     Palpations: Abdomen is soft.     Tenderness: There is no abdominal tenderness.  Musculoskeletal:        General: Normal range of motion.     Cervical back: Normal range of motion and neck supple.  Skin:    General: Skin is warm and dry.  Neurological:     Mental Status: She is alert and oriented to person, place, and time.     Cranial Nerves: No cranial nerve deficit.     Sensory: No sensory deficit.     Motor: No abnormal muscle tone.     Coordination: Coordination normal.  Psychiatric:        Mood and Affect: Mood normal.        Behavior: Behavior normal.        Thought Content: Thought content normal.        Judgment: Judgment normal.    ED Results / Procedures / Treatments   Labs (all labs ordered are listed, but only abnormal results are displayed) Labs Reviewed  BASIC METABOLIC PANEL - Abnormal; Notable for the following components:      Result Value   Potassium 3.4 (*)    Glucose, Bld 112 (*)    All other components within normal limits  CBC - Abnormal; Notable for the following components:   WBC 11.4 (*)    RBC 3.75 (*)    Hemoglobin 11.5 (*)    HCT 35.9 (*)    All other components within normal limits  TROPONIN I (HIGH SENSITIVITY)    EKG EKG Interpretation  Date/Time:  Sunday January 05 2022 16:34:46 EST Ventricular Rate:  82 PR Interval:    QRS Duration: 108 QT Interval:  384 QTC Calculation: 449 R Axis:   -31 Text Interpretation: Atrial flutter with varied AV block, Incomplete RBBB and LAFB Consider RVH w/ secondary repol abnormality Baseline wander in lead(s) V2 Partial missing lead(s): V2 since last tracing no significant change Confirmed by Daleen Bo [phone] on 01/05/2022 5:22:30  PM  Radiology DG Chest 2 View  Result Date: 01/05/2022 CLINICAL DATA:  Hervey Ard right-sided rib pain under her breast. No injury. EXAM: CHEST - 2 VIEW; RIGHT RIBS - 2 VIEW COMPARISON:  None. FINDINGS: Mild cardiomegaly. Small bilateral pleural effusions with bibasilar atelectasis. No pneumothorax. No acute osseous abnormality. Specifically, no right rib fracture or focal lesion. IMPRESSION: 1. Small bilateral pleural effusions with bibasilar atelectasis. 2. No rib fracture. Electronically Signed  By: Titus Dubin M.D.   On: 01/05/2022 19:28   DG Ribs Unilateral Right  Result Date: 01/05/2022 CLINICAL DATA:  Hervey Ard right-sided rib pain under her breast. No injury. EXAM: CHEST - 2 VIEW; RIGHT RIBS - 2 VIEW COMPARISON:  None. FINDINGS: Mild cardiomegaly. Small bilateral pleural effusions with bibasilar atelectasis. No pneumothorax. No acute osseous abnormality. Specifically, no right rib fracture or focal lesion. IMPRESSION: 1. Small bilateral pleural effusions with bibasilar atelectasis. 2. No rib fracture. Electronically Signed   By: Titus Dubin M.D.   On: 01/05/2022 19:28    Procedures Procedures    Medications Ordered in ED Medications  traMADol (ULTRAM) tablet 50 mg (has no administration in time range)  HYDROmorphone (DILAUDID) injection 0.5 mg (0.5 mg Subcutaneous Given 01/05/22 1808)    ED Course/ Medical Decision Making/ A&P                           Medical Decision Making   Patient Vitals for the past 24 hrs:  BP Temp Temp src Pulse Resp SpO2 Height Weight  01/05/22 2030 (!) 123/50 -- -- 82 18 96 % -- --  01/05/22 2015 -- -- -- 89 (!) 21 95 % -- --  01/05/22 2000 (!) 126/51 -- -- 81 18 97 % -- --  01/05/22 1953 (!) 111/53 -- -- 89 (!) 25 96 % 5\' 5"  (1.651 m) 81.2 kg  01/05/22 1805 133/77 -- -- 92 20 97 % -- --  01/05/22 1732 (!) 140/114 -- -- 87 17 97 % -- --  01/05/22 1715 -- -- -- 82 18 92 % -- --  01/05/22 1700 (!) 138/103 -- -- 81 (!) 23 (!) 88 % -- --  01/05/22 1639  -- -- -- -- -- -- 5\' 5"  (1.651 m) 81.2 kg  01/05/22 1627 140/66 100.2 F (37.9 C) Oral 86 20 100 % -- --    8:00 PM Reevaluation with update and discussion with patient. After initial assessment and treatment, an updated evaluation reveals her pain is better.  Findings discussed and questions answered. Illness risk, progression of symptoms, possibly including shingles, discussed. Daleen Bo    Medical Decision Making: Summary of Illness/Injury: She is presenting with chest wall pain, reproducible with light touch, with a clinically normal examination otherwise.  Screening testing ordered.  Differential diagnosis would include early shingles.  Critical Interventions-clinical evaluation, laboratory testing, radiography, medication treatment, observation and reassessment; to evaluate  Chief Complaint  Patient presents with   Chest Pain    and assess for illness characterized as Acute, Previously Undiagnosed, and Uncertain Prognosis   The Differential Diagnoses include chest wall pain, shingles, visceral disorders.  I did not require  Additional Historical Information from Anyone, as the patient is a good historian.  I decided to review pertinent External Data, and in summary Per note from PCP, on 11/28/2021.  Patient has chronic back pain with T and L-spine showing degenerative changes.  This is a likely contributor to her current condition.   Clinical Laboratory Tests Ordered, included CBC, Metabolic panel, and troponin . Review indicates normal except white count high, hemoglobin low, potassium low. Emergent testing abnormality management required for stabilization- no  Radiologic Tests Ordered, included chest, right ribs.  I independently Visualized: Radiographs images, which show no filtrate, edema or evident rib fracture  Cardiac Monitor Tracing which shows atrial flutter, indicating chronic changes  I  This patient is Presenting for Evaluation of posterior chest pain, which does  require a range of treatment options, and is a complaint that involves a moderate risk of morbidity and mortality.  Pharmaceutical Risk Management subcutaneous narcotic analgesia Parenteral Treatment    Treatment Complication Risk Evaluation indicates discharge from ED is appropriate   After These Interventions, the Patient was reevaluated and was found stable for discharge.  Suspect back pain associated with chronic condition, T and L-spine degenerative changes.  Doubt fracture, pneumothorax, intrathoracic disorders or cardiac disorders.  Symptomatic care with chronic medications as indicated.    CRITICAL CARE-no Performed by: Daleen Bo              Final Clinical Impression(s) / ED Diagnoses Final diagnoses:  Pain  Chest wall pain    Rx / DC Orders ED Discharge Orders     None         Daleen Bo, MD 01/05/22 2051

## 2022-01-06 DIAGNOSIS — R262 Difficulty in walking, not elsewhere classified: Secondary | ICD-10-CM | POA: Diagnosis not present

## 2022-01-06 DIAGNOSIS — M6281 Muscle weakness (generalized): Secondary | ICD-10-CM | POA: Diagnosis not present

## 2022-01-06 DIAGNOSIS — M5459 Other low back pain: Secondary | ICD-10-CM | POA: Diagnosis not present

## 2022-01-06 DIAGNOSIS — R29898 Other symptoms and signs involving the musculoskeletal system: Secondary | ICD-10-CM | POA: Diagnosis not present

## 2022-01-30 DIAGNOSIS — M6281 Muscle weakness (generalized): Secondary | ICD-10-CM | POA: Diagnosis not present

## 2022-01-30 DIAGNOSIS — R262 Difficulty in walking, not elsewhere classified: Secondary | ICD-10-CM | POA: Diagnosis not present

## 2022-01-30 DIAGNOSIS — R29898 Other symptoms and signs involving the musculoskeletal system: Secondary | ICD-10-CM | POA: Diagnosis not present

## 2022-01-30 DIAGNOSIS — M545 Low back pain, unspecified: Secondary | ICD-10-CM | POA: Diagnosis not present

## 2022-02-06 ENCOUNTER — Non-Acute Institutional Stay: Payer: PPO | Admitting: Nurse Practitioner

## 2022-02-06 ENCOUNTER — Encounter: Payer: Self-pay | Admitting: Nurse Practitioner

## 2022-02-06 ENCOUNTER — Other Ambulatory Visit: Payer: Self-pay

## 2022-02-06 ENCOUNTER — Other Ambulatory Visit: Payer: Self-pay | Admitting: Nurse Practitioner

## 2022-02-06 DIAGNOSIS — R7303 Prediabetes: Secondary | ICD-10-CM | POA: Diagnosis not present

## 2022-02-06 DIAGNOSIS — R7989 Other specified abnormal findings of blood chemistry: Secondary | ICD-10-CM | POA: Diagnosis not present

## 2022-02-06 DIAGNOSIS — J309 Allergic rhinitis, unspecified: Secondary | ICD-10-CM

## 2022-02-06 DIAGNOSIS — E876 Hypokalemia: Secondary | ICD-10-CM | POA: Diagnosis not present

## 2022-02-06 DIAGNOSIS — M545 Low back pain, unspecified: Secondary | ICD-10-CM

## 2022-02-06 DIAGNOSIS — G4733 Obstructive sleep apnea (adult) (pediatric): Secondary | ICD-10-CM | POA: Diagnosis not present

## 2022-02-06 DIAGNOSIS — Z780 Asymptomatic menopausal state: Secondary | ICD-10-CM

## 2022-02-06 DIAGNOSIS — R5381 Other malaise: Secondary | ICD-10-CM | POA: Diagnosis not present

## 2022-02-06 DIAGNOSIS — F419 Anxiety disorder, unspecified: Secondary | ICD-10-CM

## 2022-02-06 DIAGNOSIS — M8580 Other specified disorders of bone density and structure, unspecified site: Secondary | ICD-10-CM

## 2022-02-06 DIAGNOSIS — R42 Dizziness and giddiness: Secondary | ICD-10-CM

## 2022-02-06 DIAGNOSIS — K219 Gastro-esophageal reflux disease without esophagitis: Secondary | ICD-10-CM | POA: Diagnosis not present

## 2022-02-06 DIAGNOSIS — E782 Mixed hyperlipidemia: Secondary | ICD-10-CM | POA: Diagnosis not present

## 2022-02-06 DIAGNOSIS — I10 Essential (primary) hypertension: Secondary | ICD-10-CM

## 2022-02-06 DIAGNOSIS — R5383 Other fatigue: Secondary | ICD-10-CM

## 2022-02-06 DIAGNOSIS — I4892 Unspecified atrial flutter: Secondary | ICD-10-CM

## 2022-02-06 NOTE — Assessment & Plan Note (Signed)
Blood pressure is controlled, takes  Losartan/HCT 100/12.5mg  qd, Metoprolol 25mg  qd since 02/07/21, takes ASA, Atorvastatin. Bun/creat 23/0.72 1/823

## 2022-02-06 NOTE — Assessment & Plan Note (Signed)
No significant orthostatic Bp change, denied chest pain, palpitation. No noted focal weakness. No decreased ROM of neck or pain in neck. The patient declined AL [facility], Meclizine, Cardiology, desires CBC/diff, CMP/eGFR. May ED if worsens.

## 2022-02-06 NOTE — Assessment & Plan Note (Signed)
off Fluticasone nasal spray qd, on Claritin 10mg  qd, Atrovent nasal spray.

## 2022-02-06 NOTE — Assessment & Plan Note (Signed)
stable on Omeprazole 40mg  qd. Hgb 11.5 01/05/22

## 2022-02-06 NOTE — Assessment & Plan Note (Signed)
Stable,  Hgb a1c 5.7 09/24/20, 5.7 06/06/21

## 2022-02-06 NOTE — Assessment & Plan Note (Signed)
TSH 3.43 01/05/22

## 2022-02-06 NOTE — Assessment & Plan Note (Signed)
declined Alendronate after initial consented.  

## 2022-02-06 NOTE — Assessment & Plan Note (Signed)
Mild, K 3.4 01/05/22, repeat CMP/eGFR

## 2022-02-06 NOTE — Assessment & Plan Note (Signed)
Back pain mid to lower portion, better, X-ray T/L spine showed multilevel degenerative changes, no acute findings CT head/C+T+L spine, and pelvis             Hx of lower back pain, chronic, taking Tylenol, Tramadol.

## 2022-02-06 NOTE — Assessment & Plan Note (Signed)
takes Atorvastatin, LDL 67 09/24/20

## 2022-02-06 NOTE — Progress Notes (Addendum)
Location:   clinic [facility]   Place of Service:   clinic [facility] Provider: Marlana Latus NP  Code Status: DNR Goals of Care: [place] 01/05/2022  Does Patient Have a Medical Advance Directive? No  Type of Advance Directive -  Does patient want to make changes to medical advance directive? -  Copy of [place] in Chart? -  Would patient like information on creating a medical advance directive? No - Patient declined  Pre-existing out of facility DNR order (yellow form or pink MOST form) -     Chief Complaint  Patient presents with   Acute Visit    Patient presents today for dizziness since this morning. She reports dizzy feeling with standing. Also head pressure while sitting.    HPI: Patient is a 88 y.o. female seen today for c/o malaise, dizziness and pressure on top of her head since this morning, denied sore throat, congestion, no fever, cough, chest pain, abd pain, or dysuria.   ED eval 01/05/22 chest pain R, thought to be related to chronic spinal degenerative change, identified A flutter  A-flutter, ? cardiology OP declined Alendronate after initial consented.  Back pain mid to lower portion, better, X-ray T/L spine showed multilevel degenerative changes, no acute findings CT head/C+T+L spine, and pelvis             Hx of lower back pain, chronic, taking Tylenol, last dose of Tramadol was long time ago.              Allergic rhinitis, off Fluticasone nasal spray qd, on Claritin 46m qd, Atrovent nasal spray.              Anxiety, Lorazepam prn available to her. TSH 3.43 06/10/21             Elevated TSH 3.43 01/05/22             HTN, takes  Losartan/HCT 100/12.510mqd, Metoprolol 2528md since 02/07/21, takes ASA, Atorvastatin. Bun/creat 23/0.72 1/823             GERD, stable on Omeprazole 30m70m. Hgb 11.5 01/05/22             Hyperlipidemia, takes Atorvastatin, LDL 67 09/24/20             Prediabetes Hgb a1c 5.7 09/24/20, 5.7 06/06/21   Hypokalemia, K 3.4  01/05/22.     Past Medical History:  Diagnosis Date   GERD (gastroesophageal reflux disease)    Hx of cataract surgery    both eyes   Hyperglycemia    Hyperlipidemia    Hypertension    Lumbago 04/05/2016   Macular degeneration    ? which eye(s)   Obstructive sleep apnea    Osteoporosis, senile    Overactive bladder    Squamous cell skin cancer 2/16   left lower leg    Past Surgical History:  Procedure Laterality Date   ABDOMINAL HYSTERECTOMY  1960's   APPENDECTOMY  1960's   MOLE REMOVAL  2000   facial   TONSILLECTOMY      Allergies  Allergen Reactions   Adhesive [Tape]    Ampicillin    Elemental Sulfur    Nitrofuran Derivatives    Zoloft [Sertraline Hcl] Other (See Comments)    Dizziness, nausea, faint feeling, and nightmares      Allergies as of 02/06/2022       Reactions   Adhesive [tape]    Ampicillin    Elemental Sulfur  Nitrofuran Derivatives    Zoloft [sertraline Hcl] Other (See Comments)   Dizziness, nausea, faint feeling, and nightmares          Medication List        Accurate as of February 06, 2022 11:59 PM. If you have any questions, ask your nurse or doctor.          STOP taking these medications    alendronate 70 MG tablet Commonly known as: Fosamax Stopped by: Sherard Sutch X Deniss Wormley, NP       TAKE these medications    acetaminophen 500 MG tablet Commonly known as: TYLENOL Take 500 mg by mouth daily.   aspirin EC 81 MG tablet Take 1 tablet (81 mg total) by mouth daily.   atorvastatin 20 MG tablet Commonly known as: LIPITOR TAKE ONE TABLET BY MOUTH ONCE DAILY   Calcium Carb-Cholecalciferol 600-800 MG-UNIT Tabs Take 1 tablet by mouth 2 (two) times daily.   loratadine 10 MG tablet Commonly known as: CLARITIN Take 10 mg by mouth daily.   LORazepam 0.5 MG tablet Commonly known as: ATIVAN TAKE ONE TABLET ONCE DAILY AS NEEDED FOR ANXIETY.   losartan-hydrochlorothiazide 100-12.5 MG tablet Commonly known as:  HYZAAR Take one tablet by mouth once daily for high blood pressure. What changed:  how much to take how to take this when to take this   metoprolol succinate 25 MG 24 hr tablet Commonly known as: TOPROL-XL TAKE 1/2 TABLET ONCE A DAY. What changed: See the new instructions. Changed by: Leiana Rund X Emitt Maglione, NP   mupirocin cream 2 % Commonly known as: Bactroban Apply 1 application topically daily.   Ocuvite Adult 50+ Caps Take 1 capsule by mouth every evening.   omeprazole 40 MG capsule Commonly known as: PRILOSEC TAKE 1 CAPSULE DAILY 1/2 HOUR BEFORE BREAKFAST FOR STOMACH ACID. What changed: See the new instructions.   Systane 0.4-0.3 % Gel ophthalmic gel Generic drug: Polyethyl Glycol-Propyl Glycol Place 1 application into both eyes daily.   traMADol 50 MG tablet Commonly known as: ULTRAM Take 1 tablet (50 mg total) by mouth every 6 (six) hours as needed for up to 7 days.   TRIAMCINOLONE ACETONIDE(NASAL) NA Place 2 sprays into the nose. Each nostril        Review of Systems:  Review of Systems  Constitutional:  Positive for appetite change and fatigue. Negative for fever.  HENT:  Negative for congestion, hearing loss, trouble swallowing and voice change.        Improved.   Eyes:  Negative for visual disturbance.  Respiratory:  Negative for cough and shortness of breath.   Cardiovascular:  Negative for leg swelling.  Gastrointestinal:  Negative for abdominal pain and constipation.       X1-2 times in the past 3 months, diet adjustment helped.   Genitourinary:  Negative for dysuria and urgency.       0-1x/night urination, occasionally urinary leakage.   Musculoskeletal:  Positive for arthralgias, back pain and gait problem.  Skin:  Negative for color change.  Neurological:  Positive for dizziness. Negative for speech difficulty, weakness and light-headedness.       Tingling in toes sometimes, better after moves around. Pressure on top of head.   Psychiatric/Behavioral:   Negative for confusion and sleep disturbance. The patient is not nervous/anxious.    Health Maintenance  Topic Date Due   Zoster Vaccines- Shingrix (1 of 2) Never done   COVID-19 Vaccine (4 - Booster for Moderna series) 01/01/2021   TETANUS/TDAP  12/14/2022  Pneumonia Vaccine 19+ Years old  Completed   INFLUENZA VACCINE  Completed   DEXA SCAN  Completed   HPV VACCINES  Aged Out    Physical Exam: Vitals:   02/06/22 1426  BP: 128/82  Pulse: 82  Temp: 97.8 F (36.6 C)  SpO2: 98%  Weight: 169 lb 9.6 oz (76.9 kg)  Height: _0  (1.651 m)   Body mass index is 28.22 kg/m. Physical Exam Vitals and nursing note reviewed.  Constitutional:      Appearance: Normal appearance.  HENT:     Head: Normocephalic and atraumatic.     Mouth/Throat:     Mouth: Mucous membranes are moist.  Eyes:     Extraocular Movements: Extraocular movements intact.     Conjunctiva/sclera: Conjunctivae normal.     Pupils: Pupils are equal, round, and reactive to light.  Cardiovascular:     Rate and Rhythm: Normal rate and regular rhythm.     Heart sounds: No murmur heard. Pulmonary:     Effort: Pulmonary effort is normal.     Breath sounds: Normal breath sounds. No rales.  Abdominal:     General: Bowel sounds are normal.     Palpations: Abdomen is soft.     Tenderness: There is no abdominal tenderness.  Musculoskeletal:     Cervical back: Normal range of motion and neck supple.     Right lower leg: No edema.     Left lower leg: No edema.  Skin:    General: Skin is warm and dry.     Comments: The left gluteal sulcus region skin abrasion-carpet burn is healed.   Neurological:     General: No focal deficit present.     Mental Status: She is alert and oriented to person, place, and time. Mental status is at baseline.     Cranial Nerves: No cranial nerve deficit.     Motor: No weakness.     Coordination: Coordination normal.     Gait: Gait abnormal.  Psychiatric:        Mood and Affect:  Mood normal.        Behavior: Behavior normal.        Thought Content: Thought content normal.        Judgment: Judgment normal.    Labs reviewed: Basic Metabolic Panel: Recent Labs    02/12/21 0700 06/03/21 1637 06/06/21 0700 06/10/21 1046 01/05/22 1644  NA 143 141  --  143 141  K 3.9 3.4*  --  4.0 3.4*  CL 106 105  --  106 103  CO2 28 28  --  26 29  GLUCOSE 106* 93  --  108* 112*  BUN 25 27*  --  28* 23  CREATININE 0.86 0.72  --  0.87 0.72  CALCIUM 9.6 9.9  --  10.2 9.4  TSH 5.04*  --  5.07* 3.43  --    Liver Function Tests: Recent Labs    02/12/21 0700 06/10/21 1046  AST 20 25  ALT 16 22  BILITOT 0.6 0.4  PROT 6.1 6.1   No results for input(s): LIPASE, AMYLASE in the last 8760 hours. No results for input(s): AMMONIA in the last 8760 hours. CBC: Recent Labs    02/12/21 0700 06/03/21 1637 06/10/21 1046 01/05/22 1644  WBC 8.2 11.5* 9.2 11.4*  NEUTROABS 4,936  --  6,127  --   HGB 12.2 11.9* 11.7 11.5*  HCT 37.1 36.8 36.0 35.9*  MCV 93.7 95.8 95.0 95.7  PLT 191 174 213  166   Lipid Panel: No results for input(s): CHOL, HDL, LDLCALC, TRIG, CHOLHDL, LDLDIRECT in the last 8760 hours. Lab Results  Component Value Date   HGBA1C 5.6 06/06/2021    Procedures since last visit: No results found.  Assessment/Plan  Malaise and fatigue K 3.4 01/05/22, ED visit 01/05/22 noted A flutter, will update CBC/diff, CMP/eGFR, may consider Cardiology referral.  02/06/22 Na 141, K 4.2, Bun 24, creat 0.82, wbc 7.0, Hgb 11.2, plt 160, Neutrophils 60  Hypokalemia Mild, K 3.4 01/05/22, repeat CMP/eGFR  Prediabetes Stable,  Hgb a1c 5.7 09/24/20, 5.7 06/06/21   Hyperlipidemia takes Atorvastatin, LDL 67 09/24/20  GERD (gastroesophageal reflux disease) stable on Omeprazole 90m qd. Hgb 11.5 01/05/22  HTN (hypertension) Blood pressure is controlled, takes  Losartan/HCT 100/12.551mqd, Metoprolol 2531md since 02/07/21, takes ASA, Atorvastatin. Bun/creat 23/0.72 1/823  Elevated TSH TSH  3.43 01/05/22  Anxiety Lorazepam prn available to her. TSH 3.43 06/10/21  Allergic rhinitis off Fluticasone nasal spray qd, on Claritin 29m25m, Atrovent nasal spray.   Lumbago Back pain mid to lower portion, better, X-ray T/L spine showed multilevel degenerative changes, no acute findings CT head/C+T+L spine, and pelvis             Hx of lower back pain, chronic, taking Tylenol, Tramadol.  Osteopenia after menopause declined Alendronate after initial consented.   [facility]   Dizziness No significant orthostatic Bp change, denied chest pain, palpitation. No noted focal weakness. No decreased ROM of neck or pain in neck. The patient declined AL [facility], Meclizine, Cardiology, desires CBC/diff, CMP/eGFR. May ED if worsens.    Labs/tests ordered:  CBC/diff, CMP/eGFR stat  Next appt:  1 week

## 2022-02-06 NOTE — Assessment & Plan Note (Signed)
Lorazepam prn available to her. TSH 3.43 06/10/21

## 2022-02-06 NOTE — Assessment & Plan Note (Addendum)
K 3.4 01/05/22, ED visit 01/05/22 noted A flutter, will update CBC/diff, CMP/eGFR, may consider Cardiology referral.  02/06/22 Na 141, K 4.2, Bun 24, creat 0.82, wbc 7.0, Hgb 11.2, plt 160, Neutrophils 60

## 2022-02-06 NOTE — Assessment & Plan Note (Signed)
Cardiology referral if desires.

## 2022-02-07 ENCOUNTER — Encounter: Payer: Self-pay | Admitting: Nurse Practitioner

## 2022-02-10 DIAGNOSIS — L821 Other seborrheic keratosis: Secondary | ICD-10-CM | POA: Diagnosis not present

## 2022-02-10 DIAGNOSIS — L720 Epidermal cyst: Secondary | ICD-10-CM | POA: Diagnosis not present

## 2022-03-11 DIAGNOSIS — R262 Difficulty in walking, not elsewhere classified: Secondary | ICD-10-CM | POA: Diagnosis not present

## 2022-03-11 DIAGNOSIS — M6281 Muscle weakness (generalized): Secondary | ICD-10-CM | POA: Diagnosis not present

## 2022-03-11 DIAGNOSIS — M545 Low back pain, unspecified: Secondary | ICD-10-CM | POA: Diagnosis not present

## 2022-03-11 DIAGNOSIS — R29898 Other symptoms and signs involving the musculoskeletal system: Secondary | ICD-10-CM | POA: Diagnosis not present

## 2022-03-31 DIAGNOSIS — M6281 Muscle weakness (generalized): Secondary | ICD-10-CM | POA: Diagnosis not present

## 2022-03-31 DIAGNOSIS — R29898 Other symptoms and signs involving the musculoskeletal system: Secondary | ICD-10-CM | POA: Diagnosis not present

## 2022-03-31 DIAGNOSIS — R262 Difficulty in walking, not elsewhere classified: Secondary | ICD-10-CM | POA: Diagnosis not present

## 2022-03-31 DIAGNOSIS — M545 Low back pain, unspecified: Secondary | ICD-10-CM | POA: Diagnosis not present

## 2022-04-02 ENCOUNTER — Other Ambulatory Visit: Payer: Self-pay | Admitting: Nurse Practitioner

## 2022-04-14 DIAGNOSIS — N8110 Cystocele, unspecified: Secondary | ICD-10-CM | POA: Diagnosis not present

## 2022-05-07 DIAGNOSIS — G4733 Obstructive sleep apnea (adult) (pediatric): Secondary | ICD-10-CM | POA: Diagnosis not present

## 2022-05-28 ENCOUNTER — Non-Acute Institutional Stay: Payer: PPO | Admitting: Internal Medicine

## 2022-05-28 VITALS — BP 128/68 | HR 93 | Temp 97.5°F | Ht 65.0 in | Wt 172.9 lb

## 2022-05-28 DIAGNOSIS — E782 Mixed hyperlipidemia: Secondary | ICD-10-CM | POA: Diagnosis not present

## 2022-05-28 DIAGNOSIS — F419 Anxiety disorder, unspecified: Secondary | ICD-10-CM | POA: Diagnosis not present

## 2022-05-28 DIAGNOSIS — I10 Essential (primary) hypertension: Secondary | ICD-10-CM | POA: Diagnosis not present

## 2022-05-28 DIAGNOSIS — R42 Dizziness and giddiness: Secondary | ICD-10-CM

## 2022-05-28 DIAGNOSIS — L84 Corns and callosities: Secondary | ICD-10-CM | POA: Diagnosis not present

## 2022-05-28 DIAGNOSIS — G8929 Other chronic pain: Secondary | ICD-10-CM

## 2022-05-28 DIAGNOSIS — M8580 Other specified disorders of bone density and structure, unspecified site: Secondary | ICD-10-CM | POA: Diagnosis not present

## 2022-05-28 DIAGNOSIS — R600 Localized edema: Secondary | ICD-10-CM

## 2022-05-28 DIAGNOSIS — Z780 Asymptomatic menopausal state: Secondary | ICD-10-CM

## 2022-05-28 DIAGNOSIS — L570 Actinic keratosis: Secondary | ICD-10-CM | POA: Diagnosis not present

## 2022-05-28 DIAGNOSIS — E876 Hypokalemia: Secondary | ICD-10-CM

## 2022-05-28 DIAGNOSIS — M5440 Lumbago with sciatica, unspecified side: Secondary | ICD-10-CM

## 2022-05-28 DIAGNOSIS — M7751 Other enthesopathy of right foot: Secondary | ICD-10-CM | POA: Diagnosis not present

## 2022-05-28 NOTE — Patient Instructions (Signed)
I am making referal for Podiatrist for your foot Also Dermatology referal You also need Blood work.

## 2022-05-28 NOTE — Progress Notes (Signed)
Location: [facility] (12)  Provider:   Code Status:  Goals of Care:     01/05/2022    4:40 PM  Advanced Directives  Does Patient Have a Medical Advance Directive? No  Would patient like information on creating a medical advance directive? No - Patient declined     Chief Complaint  Patient presents with   Medical Management of Chronic Issues    Patient returns to the clinic for a general check up.     HPI: Patient is a 88 y.o. female seen today for an acute visit for follow up of her Chronic issues  She has h/o HTN< HLD GERD, Prediabetes, Osteopenia and Allergic Rhinitis Anxiety   She came for  Bilateral Lower leg swelling right more then Left No Worsening No SOB or Cough  Also has Callus in her Right Foot Needs Podiarty Referal  Had c/o Dizziness per [facility] but she denies it now No Chest pain or Palpitations  No falls recently [facility] with her walker Also wants to Make sure Fosamax is taken off her List    Past Medical History:  Diagnosis Date   GERD (gastroesophageal reflux disease)    Hx of cataract surgery    both eyes   Hyperglycemia    Hyperlipidemia    Hypertension    Lumbago 04/05/2016   Macular degeneration    ? which eye(s)   Obstructive sleep apnea    Osteoporosis, senile    Overactive bladder    Squamous cell skin cancer 2/16   left lower leg    Past Surgical History:  Procedure Laterality Date   ABDOMINAL HYSTERECTOMY  1960's   APPENDECTOMY  1960's   MOLE REMOVAL  2000   facial   TONSILLECTOMY      Allergies  Allergen Reactions   Adhesive [Tape]    Ampicillin    Elemental Sulfur    Nitrofuran Derivatives    Zoloft [Sertraline Hcl] Other (See Comments)    Dizziness, nausea, faint feeling, and nightmares      Outpatient Encounter Medications as of 05/28/2022  Medication Sig   acetaminophen (TYLENOL) 500 MG tablet Take 500 mg by mouth daily.    aspirin EC 81 MG tablet Take 1 tablet (81 mg  total) by mouth daily.   atorvastatin (LIPITOR) 20 MG tablet TAKE ONE TABLET BY MOUTH ONCE DAILY (Patient taking differently: Take 20 mg by mouth daily.)   Calcium Carb-Cholecalciferol 600-800 MG-UNIT TABS Take 1 tablet by mouth 2 (two) times daily.    loratadine (CLARITIN) 10 MG tablet Take 10 mg by mouth daily.   LORazepam (ATIVAN) 0.5 MG tablet TAKE ONE TABLET ONCE DAILY AS NEEDED FOR ANXIETY.   losartan-hydrochlorothiazide (HYZAAR) 100-12.5 MG tablet Take one tablet by mouth once daily for high blood pressure. (Patient taking differently: Take 1 tablet by mouth See admin instructions. Take one tablet by mouth once daily for high blood pressure.)   metoprolol succinate (TOPROL-XL) 25 MG 24 hr tablet TAKE 1/2 TABLET ONCE A DAY.   Multiple Vitamins-Minerals (OCUVITE ADULT 50+) CAPS Take 1 capsule by mouth every evening.   omeprazole (PRILOSEC) 40 MG capsule TAKE 1 CAPSULE DAILY 1/2 HOUR BEFORE BREAKFAST FOR STOMACH ACID.   Polyethyl Glycol-Propyl Glycol (SYSTANE) 0.4-0.3 % GEL ophthalmic gel Place 1 application into both eyes daily.   traMADol (ULTRAM) 50 MG tablet Take 1 tablet (50 mg total) by mouth every 6 (six) hours as needed for up to 7 days.  TRIAMCINOLONE ACETONIDE,NASAL, NA Place 2 sprays into the nose. Each nostril   [DISCONTINUED] mupirocin cream (BACTROBAN) 2 % Apply 1 application topically daily. (Patient not taking: Reported on 05/28/2022)   No facility-administered encounter medications on file as of 05/28/2022.    Review of Systems:  Review of Systems  Constitutional:  Negative for activity change and appetite change.  HENT: Negative.    Respiratory:  Negative for cough and shortness of breath.   Cardiovascular:  Positive for leg swelling.  Gastrointestinal:  Negative for constipation.  Genitourinary: Negative.   Musculoskeletal:  Positive for gait problem. Negative for arthralgias and myalgias.  Skin: Negative.   Neurological:  Negative for dizziness and weakness.   Psychiatric/Behavioral:  Negative for confusion, dysphoric mood and sleep disturbance.    Health Maintenance  Topic Date Due   Zoster Vaccines- Shingrix (1 of 2) Never done   COVID-19 Vaccine (4 - Booster for Moderna series) 01/01/2021   INFLUENZA VACCINE  07/29/2022   TETANUS/TDAP  12/14/2022   Pneumonia Vaccine 77+ Years old  Completed   DEXA SCAN  Completed   HPV VACCINES  Aged Out    Physical Exam: Vitals:   05/28/22 1318  BP: 128/68  Pulse: 93  Temp: (!) 97.5 F (36.4 C)  SpO2: 95%  Weight: 172 lb 14.4 oz (78.4 kg)  Height: '5\' 5"'$  (1.651 m)   Body mass index is 28.77 kg/m. Physical Exam Vitals reviewed.  Constitutional:      Appearance: Normal appearance.  HENT:     Head: Normocephalic.     Nose: Nose normal.     Mouth/Throat:     Mouth: Mucous membranes are moist.     Pharynx: Oropharynx is clear.  Eyes:     Pupils: Pupils are equal, round, and reactive to light.  Cardiovascular:     Rate and Rhythm: Normal rate and regular rhythm.     Pulses: Normal pulses.     Heart sounds: Normal heart sounds. No murmur heard. Pulmonary:     Effort: Pulmonary effort is normal.     Breath sounds: Normal breath sounds.  Abdominal:     General: Abdomen is flat. Bowel sounds are normal.     Palpations: Abdomen is soft.  Musculoskeletal:     Cervical back: Neck supple.     Comments: Mild swelling Right more then left  Skin:    General: Skin is warm.  Neurological:     General: No focal deficit present.     Mental Status: She is alert and oriented to person, place, and time.  Psychiatric:        Mood and Affect: Mood normal.        Thought Content: Thought content normal.    Labs reviewed: Basic Metabolic Panel: Recent Labs    06/03/21 1637 06/06/21 0700 06/10/21 1046 01/05/22 1644  NA 141  --  143 141  K 3.4*  --  4.0 3.4*  CL 105  --  106 103  CO2 28  --  26 29  GLUCOSE 93  --  108* 112*  BUN 27*  --  28* 23  CREATININE 0.72  --  0.87 0.72  CALCIUM 9.9   --  10.2 9.4  TSH  --  5.07* 3.43  --    Liver Function Tests: Recent Labs    06/10/21 1046  AST 25  ALT 22  BILITOT 0.4  PROT 6.1   No results for input(s): LIPASE, AMYLASE in the last 8760 hours. No results for  input(s): AMMONIA in the last 8760 hours. CBC: Recent Labs    06/03/21 1637 06/10/21 1046 01/05/22 1644  WBC 11.5* 9.2 11.4*  NEUTROABS  --  6,127  --   HGB 11.9* 11.7 11.5*  HCT 36.8 36.0 35.9*  MCV 95.8 95.0 95.7  PLT 174 213 166   Lipid Panel: No results for input(s): CHOL, HDL, LDLCALC, TRIG, CHOLHDL, LDLDIRECT in the last 8760 hours. Lab Results  Component Value Date   HGBA1C 5.6 06/06/2021    Procedures since last visit: No results found.  Assessment/Plan 1. Bilateral edema of lower extremity Continue Stockings and elevation Also on Low dose of Hyzaar  2. Hypokalemia Repeat BMP  3. Osteopenia after menopause Does not need to be on Fosamax Will take it out of her list  4. Callus of foot  - Ambulatory referral to Podiatry  5. Bone spur of right foot  - Ambulatory referral to Podiatry  6. Keratosis - Ambulatory referral to Dermatology  7. Dizziness No Complains Anymore  8. Primary hypertension On Hyzaar and Toprol  9. Mixed hyperlipidemia Continue statin  10. Anxiety Takes Ativan Seldomly  11. Chronic bilateral low back pain with sciatica, sciatica laterality unspecified Manageable Takes Tramadol pRn    Labs/tests ordered:  * No order type specified * Next appt:  06/12/2022

## 2022-05-31 ENCOUNTER — Other Ambulatory Visit: Payer: Self-pay | Admitting: Nurse Practitioner

## 2022-06-08 ENCOUNTER — Other Ambulatory Visit: Payer: Self-pay | Admitting: Nurse Practitioner

## 2022-06-11 ENCOUNTER — Other Ambulatory Visit: Payer: Self-pay

## 2022-06-11 DIAGNOSIS — R7989 Other specified abnormal findings of blood chemistry: Secondary | ICD-10-CM | POA: Diagnosis not present

## 2022-06-11 DIAGNOSIS — K219 Gastro-esophageal reflux disease without esophagitis: Secondary | ICD-10-CM

## 2022-06-11 DIAGNOSIS — E782 Mixed hyperlipidemia: Secondary | ICD-10-CM

## 2022-06-11 DIAGNOSIS — R7303 Prediabetes: Secondary | ICD-10-CM

## 2022-06-13 LAB — HEMOGLOBIN A1C
Hgb A1c MFr Bld: 5.7 % of total Hgb — ABNORMAL HIGH (ref <5.7)
Mean Plasma Glucose: 117 mg/dL
eAG (mmol/L): 6.5 mmol/L

## 2022-06-13 LAB — CBC WITH DIFFERENTIAL/PLATELET
Absolute Monocytes: 567 cells/uL (ref 200–950)
Basophils Absolute: 63 cells/uL (ref 0–200)
Basophils Relative: 0.9 %
Eosinophils Absolute: 210 cells/uL (ref 15–500)
Eosinophils Relative: 3 %
HCT: 36.3 % (ref 35.0–45.0)
Hemoglobin: 11.5 g/dL — ABNORMAL LOW (ref 11.7–15.5)
Lymphs Abs: 2464 cells/uL (ref 850–3900)
MCH: 30.3 pg (ref 27.0–33.0)
MCHC: 31.7 g/dL — ABNORMAL LOW (ref 32.0–36.0)
MCV: 95.5 fL (ref 80.0–100.0)
MPV: 9.9 fL (ref 7.5–12.5)
Monocytes Relative: 8.1 %
Neutro Abs: 3696 cells/uL (ref 1500–7800)
Neutrophils Relative %: 52.8 %
Platelets: 206 10*3/uL (ref 140–400)
RBC: 3.8 10*6/uL (ref 3.80–5.10)
RDW: 13.1 % (ref 11.0–15.0)
Total Lymphocyte: 35.2 %
WBC: 7 10*3/uL (ref 3.8–10.8)

## 2022-06-13 LAB — COMPLETE METABOLIC PANEL WITH GFR
AG Ratio: 1.9 (calc) (ref 1.0–2.5)
ALT: 13 U/L (ref 6–29)
AST: 19 U/L (ref 10–35)
Albumin: 4.1 g/dL (ref 3.6–5.1)
Alkaline phosphatase (APISO): 77 U/L (ref 37–153)
BUN/Creatinine Ratio: 36 (calc) — ABNORMAL HIGH (ref 6–22)
BUN: 35 mg/dL — ABNORMAL HIGH (ref 7–25)
CO2: 29 mmol/L (ref 20–32)
Calcium: 9.8 mg/dL (ref 8.6–10.4)
Chloride: 106 mmol/L (ref 98–110)
Creat: 0.97 mg/dL — ABNORMAL HIGH (ref 0.60–0.95)
Globulin: 2.2 g/dL (calc) (ref 1.9–3.7)
Glucose, Bld: 85 mg/dL (ref 65–99)
Potassium: 3.5 mmol/L (ref 3.5–5.3)
Sodium: 143 mmol/L (ref 135–146)
Total Bilirubin: 0.5 mg/dL (ref 0.2–1.2)
Total Protein: 6.3 g/dL (ref 6.1–8.1)
eGFR: 53 mL/min/{1.73_m2} — ABNORMAL LOW (ref >=60)

## 2022-06-13 LAB — LIPID PANEL
Cholesterol: 139 mg/dL
HDL: 56 mg/dL
LDL Cholesterol (Calc): 70 mg/dL
Non-HDL Cholesterol (Calc): 83 mg/dL
Total CHOL/HDL Ratio: 2.5 (calc)
Triglycerides: 57 mg/dL

## 2022-06-13 LAB — TSH: TSH: 5.07 m[IU]/L — ABNORMAL HIGH (ref 0.40–4.50)

## 2022-06-20 ENCOUNTER — Ambulatory Visit (INDEPENDENT_AMBULATORY_CARE_PROVIDER_SITE_OTHER): Payer: PPO | Admitting: Podiatry

## 2022-06-20 ENCOUNTER — Encounter: Payer: Self-pay | Admitting: Podiatry

## 2022-06-20 DIAGNOSIS — L84 Corns and callosities: Secondary | ICD-10-CM | POA: Diagnosis not present

## 2022-06-20 DIAGNOSIS — D169 Benign neoplasm of bone and articular cartilage, unspecified: Secondary | ICD-10-CM | POA: Diagnosis not present

## 2022-08-07 DIAGNOSIS — G4733 Obstructive sleep apnea (adult) (pediatric): Secondary | ICD-10-CM | POA: Diagnosis not present

## 2022-08-13 ENCOUNTER — Other Ambulatory Visit: Payer: Self-pay | Admitting: Nurse Practitioner

## 2022-08-13 NOTE — Telephone Encounter (Signed)
RX last refilled on 08/13/2021   No treatment agreement on file, notation made on pending appointment

## 2022-08-19 DIAGNOSIS — M2042 Other hammer toe(s) (acquired), left foot: Secondary | ICD-10-CM | POA: Diagnosis not present

## 2022-08-19 DIAGNOSIS — M79671 Pain in right foot: Secondary | ICD-10-CM | POA: Diagnosis not present

## 2022-08-19 DIAGNOSIS — M2011 Hallux valgus (acquired), right foot: Secondary | ICD-10-CM | POA: Diagnosis not present

## 2022-08-19 DIAGNOSIS — L84 Corns and callosities: Secondary | ICD-10-CM | POA: Diagnosis not present

## 2022-08-19 DIAGNOSIS — M79672 Pain in left foot: Secondary | ICD-10-CM | POA: Diagnosis not present

## 2022-08-19 DIAGNOSIS — M2141 Flat foot [pes planus] (acquired), right foot: Secondary | ICD-10-CM | POA: Diagnosis not present

## 2022-08-19 DIAGNOSIS — M2012 Hallux valgus (acquired), left foot: Secondary | ICD-10-CM | POA: Diagnosis not present

## 2022-08-19 DIAGNOSIS — M2041 Other hammer toe(s) (acquired), right foot: Secondary | ICD-10-CM | POA: Diagnosis not present

## 2022-08-28 ENCOUNTER — Encounter: Payer: Self-pay | Admitting: Nurse Practitioner

## 2022-08-28 ENCOUNTER — Non-Acute Institutional Stay: Payer: PPO | Admitting: Nurse Practitioner

## 2022-08-28 VITALS — BP 140/80 | HR 86 | Temp 97.3°F | Resp 18 | Wt 174.0 lb

## 2022-08-28 DIAGNOSIS — E876 Hypokalemia: Secondary | ICD-10-CM | POA: Diagnosis not present

## 2022-08-28 DIAGNOSIS — J309 Allergic rhinitis, unspecified: Secondary | ICD-10-CM | POA: Diagnosis not present

## 2022-08-28 DIAGNOSIS — I10 Essential (primary) hypertension: Secondary | ICD-10-CM | POA: Diagnosis not present

## 2022-08-28 DIAGNOSIS — M7751 Other enthesopathy of right foot: Secondary | ICD-10-CM

## 2022-08-28 DIAGNOSIS — R7303 Prediabetes: Secondary | ICD-10-CM | POA: Diagnosis not present

## 2022-08-28 DIAGNOSIS — Z780 Asymptomatic menopausal state: Secondary | ICD-10-CM

## 2022-08-28 DIAGNOSIS — R609 Edema, unspecified: Secondary | ICD-10-CM

## 2022-08-28 DIAGNOSIS — E782 Mixed hyperlipidemia: Secondary | ICD-10-CM | POA: Diagnosis not present

## 2022-08-28 DIAGNOSIS — M545 Low back pain, unspecified: Secondary | ICD-10-CM

## 2022-08-28 DIAGNOSIS — I4892 Unspecified atrial flutter: Secondary | ICD-10-CM | POA: Diagnosis not present

## 2022-08-28 DIAGNOSIS — F419 Anxiety disorder, unspecified: Secondary | ICD-10-CM

## 2022-08-28 DIAGNOSIS — M8580 Other specified disorders of bone density and structure, unspecified site: Secondary | ICD-10-CM | POA: Diagnosis not present

## 2022-08-28 DIAGNOSIS — K219 Gastro-esophageal reflux disease without esophagitis: Secondary | ICD-10-CM | POA: Diagnosis not present

## 2022-08-28 DIAGNOSIS — R7989 Other specified abnormal findings of blood chemistry: Secondary | ICD-10-CM

## 2022-08-28 MED ORDER — TRAMADOL HCL 50 MG PO TABS: 50.0000 mg | ORAL_TABLET | Freq: Four times a day (QID) | ORAL | 1 refills | Status: DC | PRN

## 2022-08-28 NOTE — Assessment & Plan Note (Signed)
stable on Omeprazole, Hgb 11.5 06/11/22 

## 2022-08-28 NOTE — Assessment & Plan Note (Signed)
Elevated TSH 5.07 06/11/22, f/u TSH 6 months.

## 2022-08-28 NOTE — Progress Notes (Signed)
Location:   clinic [place]   Place of Service:  Clinic (12) Provider: Marlana Latus NP  Code Status: DNR Goals of Care: [place]    01/05/2022    4:40 PM  Advanced Directives  Does Patient Have a Medical Advance Directive? No  Would patient like information on creating a medical advance directive? No - Patient declined     Chief Complaint  Patient presents with   Medical Management of Chronic Issues    Patient is here for a 23M F/U, patient is due for updated vaccines, fall screening, and lab review from 06/13/2022    HPI: Patient is a 88 y.o. female seen today for medical management of chronic diseases.    Edema BLE, trace, on Hyzaar, TED  ED eval 01/05/22 chest pain R, thought to be related to chronic spinal degenerative change, identified A flutter             A-flutter, no recurrence, delay cardiology OP declined Alendronate after initial consented.  Back pain mid to lower portion, better, X-ray T/L spine showed multilevel degenerative changes, no acute findings CT head/C+T+L spine, and pelvis             Hx of lower back pain, chronic, taking Tylenol, last dose of Tramadol was long time ago.   Bone spur R foot, callus of foot, f/u podiatrist.              Allergic rhinitis, off Fluticasone nasal spray qd, on Claritin, Atrovent nasal spray.              Anxiety, Lorazepam prn available to her.              Elevated TSH 5.07 06/11/22, f/u TSH             HTN, takes  Losartan/HCT, Metoprolol,  takes ASA, Atorvastatin. Bun/creat 35/0.97 06/11/22             GERD, stable on Omeprazole, Hgb 11.5 06/11/22             Hyperlipidemia, takes Atorvastatin, LDL 70 06/11/22             Prediabetes, diet controlled, Hgb a1c 5.7 06/11/22             Hypokalemia, K 3.5 06/11/22     Past Medical History:  Diagnosis Date   GERD (gastroesophageal reflux disease)    Hx of cataract surgery    both eyes   Hyperglycemia    Hyperlipidemia    Hypertension    Lumbago 04/05/2016   Macular degeneration    ?  which eye(s)   Obstructive sleep apnea    Osteoporosis, senile    Overactive bladder    Squamous cell skin cancer 2/16   left lower leg    Past Surgical History:  Procedure Laterality Date   ABDOMINAL HYSTERECTOMY  1960's   APPENDECTOMY  1960's   MOLE REMOVAL  2000   facial   TONSILLECTOMY      Allergies  Allergen Reactions   Adhesive [Tape]    Ampicillin    Elemental Sulfur    Nitrofuran Derivatives    Zoloft [Sertraline Hcl] Other (See Comments)    Dizziness, nausea, faint feeling, and nightmares      Allergies as of 08/28/2022       Reactions   Adhesive [tape]    Ampicillin    Elemental Sulfur    Nitrofuran Derivatives    Zoloft [sertraline Hcl] Other (See Comments)   Dizziness, nausea,  faint feeling, and nightmares          Medication List        Accurate as of August 28, 2022 11:59 PM. If you have any questions, ask your nurse or doctor.          acetaminophen 500 MG tablet Commonly known as: TYLENOL Take 500 mg by mouth daily.   aspirin EC 81 MG tablet Take 1 tablet (81 mg total) by mouth daily.   atorvastatin 20 MG tablet Commonly known as: LIPITOR TAKE ONE TABLET BY MOUTH ONCE DAILY   Calcium Carb-Cholecalciferol 600-800 MG-UNIT Tabs Take 1 tablet by mouth 2 (two) times daily.   loratadine 10 MG tablet Commonly known as: CLARITIN Take 10 mg by mouth daily.   LORazepam 0.5 MG tablet Commonly known as: ATIVAN TAKE ONE TABLET ONCE DAILY AS NEEDED FOR ANXIETY.   losartan-hydrochlorothiazide 100-12.5 MG tablet Commonly known as: HYZAAR Take one tablet by mouth once daily for high blood pressure.   metoprolol succinate 25 MG 24 hr tablet Commonly known as: TOPROL-XL TAKE 1/2 TABLET ONCE A DAY.   Ocuvite Adult 50+ Caps Take 1 capsule by mouth every evening.   omeprazole 40 MG capsule Commonly known as: PRILOSEC TAKE 1 CAPSULE DAILY 1/2 HOUR BEFORE BREAKFAST FOR STOMACH ACID.   Systane 0.4-0.3 % Gel ophthalmic gel Generic drug:  Polyethyl Glycol-Propyl Glycol Place 1 application into both eyes daily.   traMADol 50 MG tablet Commonly known as: ULTRAM Take 1 tablet (50 mg total) by mouth every 6 (six) hours as needed. What changed: See the new instructions. Changed by: Ariaunna Longsworth X Reyonna Haack, NP   TRIAMCINOLONE ACETONIDE(NASAL) NA Place 2 sprays into the nose. Each nostril        Review of Systems:  Review of Systems  Constitutional:  Positive for appetite change and fatigue. Negative for fever.  HENT:  Negative for congestion, hearing loss, trouble swallowing and voice change.        Improved.   Eyes:  Negative for visual disturbance.  Respiratory:  Negative for cough and shortness of breath.   Cardiovascular:  Negative for leg swelling.  Gastrointestinal:  Negative for abdominal pain and constipation.       X1-2 times in the past 3 months, diet adjustment helped.   Genitourinary:  Negative for dysuria and urgency.       0-1x/night urination, occasionally urinary leakage.   Musculoskeletal:  Positive for arthralgias, back pain and gait problem.  Skin:  Negative for color change.  Neurological:  Negative for dizziness, speech difficulty, weakness and light-headedness.       Tingling in toes sometimes, better after moves around.   Psychiatric/Behavioral:  Negative for confusion and sleep disturbance. The patient is not nervous/anxious.     Health Maintenance  Topic Date Due   Zoster Vaccines- Shingrix (1 of 2) Never done   COVID-19 Vaccine (4 - Moderna risk series) 01/01/2021   INFLUENZA VACCINE  07/29/2022   TETANUS/TDAP  12/14/2022   Pneumonia Vaccine 26+ Years old  Completed   DEXA SCAN  Completed   HPV VACCINES  Aged Out    Physical Exam: Vitals:   08/28/22 1323  BP: (!) 140/80  Pulse: 86  Resp: 18  Temp: (!) 97.3 F (36.3 C)  SpO2: 99%  Weight: 174 lb (78.9 kg)   Body mass index is 28.96 kg/m. Physical Exam Vitals and nursing note reviewed.  Constitutional:      Appearance: Normal  appearance.  HENT:     Head:  Normocephalic and atraumatic.     Mouth/Throat:     Mouth: Mucous membranes are moist.  Eyes:     Extraocular Movements: Extraocular movements intact.     Conjunctiva/sclera: Conjunctivae normal.     Pupils: Pupils are equal, round, and reactive to light.  Cardiovascular:     Rate and Rhythm: Normal rate and regular rhythm.     Heart sounds: No murmur heard. Pulmonary:     Effort: Pulmonary effort is normal.     Breath sounds: Normal breath sounds. No rales.  Abdominal:     General: Bowel sounds are normal.     Palpations: Abdomen is soft.     Tenderness: There is no abdominal tenderness.  Musculoskeletal:     Cervical back: Normal range of motion and neck supple.     Right lower leg: No edema.     Left lower leg: No edema.  Skin:    General: Skin is warm and dry.  Neurological:     General: No focal deficit present.     Mental Status: She is alert and oriented to person, place, and time. Mental status is at baseline.     Gait: Gait abnormal.  Psychiatric:        Mood and Affect: Mood normal.        Behavior: Behavior normal.        Thought Content: Thought content normal.        Judgment: Judgment normal.   .  Labs reviewed: Basic Metabolic Panel: Recent Labs    01/05/22 1644 06/11/22 1135  NA 141 143  K 3.4* 3.5  CL 103 106  CO2 29 29  GLUCOSE 112* 85  BUN 23 35*  CREATININE 0.72 0.97*  CALCIUM 9.4 9.8  TSH  --  5.07*   Liver Function Tests: Recent Labs    06/11/22 1135  AST 19  ALT 13  BILITOT 0.5  PROT 6.3   No results for input(s): "LIPASE", "AMYLASE" in the last 8760 hours. No results for input(s): "AMMONIA" in the last 8760 hours. CBC: Recent Labs    01/05/22 1644 06/11/22 1135  WBC 11.4* 7.0  NEUTROABS  --  3,696  HGB 11.5* 11.5*  HCT 35.9* 36.3  MCV 95.7 95.5  PLT 166 206   Lipid Panel: Recent Labs    06/11/22 1135  CHOL 139  HDL 56  LDLCALC 70  TRIG 57  CHOLHDL 2.5   Lab Results  Component  Value Date   HGBA1C 5.7 (H) 06/11/2022    Procedures since last visit: No results found.  Assessment/Plan  Elevated TSH Elevated TSH 5.07 06/11/22, f/u TSH 6 months.   HTN (hypertension)  takes  Losartan/HCT, Metoprolol,  takes ASA, Atorvastatin. Bun/creat 35/0.97 06/11/22  GERD (gastroesophageal reflux disease) stable on Omeprazole, Hgb 11.5 06/11/22  Hyperlipidemia takes Atorvastatin, LDL 70 06/11/22  Prediabetes  diet controlled, Hgb a1c 5.7 06/11/22  Hypokalemia K 3.5 06/11/22  Anxiety Lorazepam prn available to her.   Allergic rhinitis off Fluticasone nasal spray qd, on Claritin, Atrovent nasal spray.   Bone spur of right foot Bone spur R foot, callus of foot, f/u podiatrist.   Osteopenia after menopause Off Fosamax.   Lumbago Hx of lower back pain, chronic, taking Tylenol, last dose of Tramadol was long time ago.   Atrial flutter (HCC) No recurrence, delayed cardiology  Edema Chronic, trace, remains no change.    Labs/tests ordered: TSH 6 months  Next appt:  3 months

## 2022-08-28 NOTE — Assessment & Plan Note (Signed)
No recurrence, delayed cardiology

## 2022-08-28 NOTE — Assessment & Plan Note (Signed)
K 3.5 06/11/22

## 2022-08-28 NOTE — Assessment & Plan Note (Signed)
Off Fosamax.

## 2022-08-28 NOTE — Assessment & Plan Note (Signed)
takes  Losartan/HCT, Metoprolol,  takes ASA, Atorvastatin. Bun/creat 35/0.97 06/11/22 

## 2022-08-28 NOTE — Assessment & Plan Note (Signed)
diet controlled, Hgb a1c 5.7 06/11/22 

## 2022-08-28 NOTE — Assessment & Plan Note (Signed)
Chronic, trace, remains no change.

## 2022-08-28 NOTE — Assessment & Plan Note (Signed)
takes Atorvastatin, LDL 70 06/11/22 

## 2022-08-28 NOTE — Assessment & Plan Note (Signed)
Hx of lower back pain, chronic, taking Tylenol, last dose of Tramadol was long time ago.

## 2022-08-28 NOTE — Assessment & Plan Note (Signed)
off Fluticasone nasal spray qd, on Claritin, Atrovent nasal spray.  

## 2022-08-28 NOTE — Assessment & Plan Note (Signed)
Bone spur R foot, callus of foot, f/u podiatrist.

## 2022-08-28 NOTE — Assessment & Plan Note (Signed)
Lorazepam prn available to her.  

## 2022-08-29 ENCOUNTER — Encounter: Payer: Self-pay | Admitting: Nurse Practitioner

## 2022-09-09 DIAGNOSIS — L821 Other seborrheic keratosis: Secondary | ICD-10-CM | POA: Diagnosis not present

## 2022-09-09 DIAGNOSIS — Z85810 Personal history of malignant neoplasm of tongue: Secondary | ICD-10-CM | POA: Diagnosis not present

## 2022-09-09 DIAGNOSIS — L814 Other melanin hyperpigmentation: Secondary | ICD-10-CM | POA: Diagnosis not present

## 2022-09-26 DIAGNOSIS — N8110 Cystocele, unspecified: Secondary | ICD-10-CM | POA: Diagnosis not present

## 2022-10-03 ENCOUNTER — Other Ambulatory Visit: Payer: Self-pay | Admitting: Nurse Practitioner

## 2022-10-28 DIAGNOSIS — L84 Corns and callosities: Secondary | ICD-10-CM | POA: Diagnosis not present

## 2022-10-28 DIAGNOSIS — M79671 Pain in right foot: Secondary | ICD-10-CM | POA: Diagnosis not present

## 2022-10-28 DIAGNOSIS — M79672 Pain in left foot: Secondary | ICD-10-CM | POA: Diagnosis not present

## 2022-10-28 DIAGNOSIS — B351 Tinea unguium: Secondary | ICD-10-CM | POA: Diagnosis not present

## 2022-10-29 DIAGNOSIS — G4733 Obstructive sleep apnea (adult) (pediatric): Secondary | ICD-10-CM | POA: Diagnosis not present

## 2022-11-13 ENCOUNTER — Non-Acute Institutional Stay: Payer: PPO | Admitting: Nurse Practitioner

## 2022-11-13 ENCOUNTER — Encounter: Payer: Self-pay | Admitting: Nurse Practitioner

## 2022-11-13 VITALS — BP 130/70 | HR 75 | Temp 98.0°F | Resp 16 | Ht 65.0 in | Wt 174.0 lb

## 2022-11-13 DIAGNOSIS — I10 Essential (primary) hypertension: Secondary | ICD-10-CM | POA: Diagnosis not present

## 2022-11-13 DIAGNOSIS — R7303 Prediabetes: Secondary | ICD-10-CM

## 2022-11-13 DIAGNOSIS — M8580 Other specified disorders of bone density and structure, unspecified site: Secondary | ICD-10-CM | POA: Diagnosis not present

## 2022-11-13 DIAGNOSIS — G4733 Obstructive sleep apnea (adult) (pediatric): Secondary | ICD-10-CM | POA: Diagnosis not present

## 2022-11-13 DIAGNOSIS — E782 Mixed hyperlipidemia: Secondary | ICD-10-CM

## 2022-11-13 DIAGNOSIS — M159 Polyosteoarthritis, unspecified: Secondary | ICD-10-CM | POA: Insufficient documentation

## 2022-11-13 DIAGNOSIS — Z780 Asymptomatic menopausal state: Secondary | ICD-10-CM

## 2022-11-13 DIAGNOSIS — F419 Anxiety disorder, unspecified: Secondary | ICD-10-CM | POA: Diagnosis not present

## 2022-11-13 DIAGNOSIS — R609 Edema, unspecified: Secondary | ICD-10-CM

## 2022-11-13 DIAGNOSIS — J309 Allergic rhinitis, unspecified: Secondary | ICD-10-CM

## 2022-11-13 DIAGNOSIS — I4892 Unspecified atrial flutter: Secondary | ICD-10-CM | POA: Diagnosis not present

## 2022-11-13 DIAGNOSIS — F5105 Insomnia due to other mental disorder: Secondary | ICD-10-CM

## 2022-11-13 DIAGNOSIS — K219 Gastro-esophageal reflux disease without esophagitis: Secondary | ICD-10-CM

## 2022-11-13 DIAGNOSIS — M150 Primary generalized (osteo)arthritis: Secondary | ICD-10-CM

## 2022-11-13 DIAGNOSIS — R7989 Other specified abnormal findings of blood chemistry: Secondary | ICD-10-CM

## 2022-11-13 NOTE — Assessment & Plan Note (Signed)
Elevated TSH 5.07 06/11/22, f/u TSH pending

## 2022-11-13 NOTE — Assessment & Plan Note (Signed)
Lorazepam prn available to her.  

## 2022-11-13 NOTE — Assessment & Plan Note (Signed)
trace, on Hyzaar, TED 

## 2022-11-13 NOTE — Assessment & Plan Note (Signed)
01/05/22, no recurrence, delay cardiology

## 2022-11-13 NOTE — Assessment & Plan Note (Signed)
off Fluticasone nasal spray qd, on Claritin, Atrovent nasal spray.  

## 2022-11-13 NOTE — Assessment & Plan Note (Signed)
CPAP, sleep study.

## 2022-11-13 NOTE — Progress Notes (Signed)
Location:   Clinic [facility]   Place of Service:  Clinic (12) Provider: Marlana Latus NP  Code Status: DNR Goals of Care: [place]    01/05/2022    4:40 PM  Advanced Directives  Does Patient Have a Medical Advance Directive? No  Would patient like information on creating a medical advance directive? No - Patient declined     Chief Complaint  Patient presents with   Medical Management of Chronic Issues    Patient is here for a follow up for chronic conditions    Quality Metric Gaps    Patient due for AWV, and covid booster    HPI: Patient is a 88 y.o. female seen today for medical management of chronic diseases.     Edema BLE, trace, on Hyzaar, TED  OSA, CPAP             A-flutter 01/05/22, no recurrence, delay cardiology OP declined Alendronate after initial consented.  Back pain mid to lower portion, better, X-ray T/L spine showed multilevel degenerative changes, no acute findings CT head/C+T+L spine, and pelvis,  taking Tylenol, Tramadol             Bone spur R foot, callus of foot, f/u podiatrist.              Allergic rhinitis, off Fluticasone nasal spray qd, on Claritin, Atrovent nasal spray.              Anxiety, Lorazepam prn available to her.              Elevated TSH 5.07 06/11/22, f/u TSH pending             HTN, takes  Losartan/HCT, Metoprolol,  takes ASA, Atorvastatin. Bun/creat 35/0.97 06/11/22             GERD, stable on Omeprazole, Hgb 11.5 06/11/22             Hyperlipidemia, takes Atorvastatin, LDL 70 06/11/22             Prediabetes, diet controlled, Hgb a1c 5.7 06/11/22             Hypokalemia, K 3.5 06/11/22     Past Medical History:  Diagnosis Date   GERD (gastroesophageal reflux disease)    Hx of cataract surgery    both eyes   Hyperglycemia    Hyperlipidemia    Hypertension    Lumbago 04/05/2016   Macular degeneration    ? which eye(s)   Obstructive sleep apnea    Osteoporosis, senile    Overactive bladder    Squamous cell skin cancer 2/16   left lower leg     Past Surgical History:  Procedure Laterality Date   ABDOMINAL HYSTERECTOMY  1960's   APPENDECTOMY  1960's   MOLE REMOVAL  2000   facial   TONSILLECTOMY      Allergies  Allergen Reactions   Adhesive [Tape]    Ampicillin    Elemental Sulfur    Nitrofuran Derivatives    Zoloft [Sertraline Hcl] Other (See Comments)    Dizziness, nausea, faint feeling, and nightmares      Allergies as of 11/13/2022       Reactions   Adhesive [tape]    Ampicillin    Elemental Sulfur    Nitrofuran Derivatives    Zoloft [sertraline Hcl] Other (See Comments)   Dizziness, nausea, faint feeling, and nightmares          Medication List  Accurate as of November 13, 2022 11:59 PM. If you have any questions, ask your nurse or doctor.          acetaminophen 500 MG tablet Commonly known as: TYLENOL Take 500 mg by mouth daily.   aspirin EC 81 MG tablet Take 1 tablet (81 mg total) by mouth daily.   atorvastatin 20 MG tablet Commonly known as: LIPITOR TAKE ONE TABLET BY MOUTH ONCE DAILY   Calcium Carb-Cholecalciferol 600-800 MG-UNIT Tabs Take 1 tablet by mouth 2 (two) times daily.   loratadine 10 MG tablet Commonly known as: CLARITIN Take 10 mg by mouth daily.   LORazepam 0.5 MG tablet Commonly known as: ATIVAN TAKE ONE TABLET ONCE DAILY AS NEEDED FOR ANXIETY.   losartan-hydrochlorothiazide 100-12.5 MG tablet Commonly known as: HYZAAR Take one tablet by mouth once daily for high blood pressure.   metoprolol succinate 25 MG 24 hr tablet Commonly known as: TOPROL-XL TAKE 1/2 TABLET ONCE A DAY.   Ocuvite Adult 50+ Caps Take 1 capsule by mouth every evening.   omeprazole 40 MG capsule Commonly known as: PRILOSEC TAKE 1 CAPSULE DAILY 1/2 HOUR BEFORE BREAKFAST FOR STOMACH ACID.   Systane 0.4-0.3 % Gel ophthalmic gel Generic drug: Polyethyl Glycol-Propyl Glycol Place 1 application into both eyes daily.   traMADol 50 MG tablet Commonly known as: ULTRAM Take 1  tablet (50 mg total) by mouth every 6 (six) hours as needed.   TRIAMCINOLONE ACETONIDE(NASAL) NA Place 2 sprays into the nose. Each nostril        Review of Systems:  Review of Systems  Constitutional:  Negative for appetite change, fatigue and fever.  HENT:  Negative for congestion, hearing loss, trouble swallowing and voice change.        Improved.   Eyes:  Negative for visual disturbance.  Respiratory:  Negative for cough and shortness of breath.   Cardiovascular:  Negative for leg swelling.  Gastrointestinal:  Negative for abdominal pain and constipation.       X1-2 times in the past 3 months, diet adjustment helped.   Genitourinary:  Negative for dysuria and urgency.       0-1x/night urination, occasionally urinary leakage.   Musculoskeletal:  Positive for arthralgias, back pain and gait problem.  Skin:  Negative for color change.  Neurological:  Negative for dizziness, speech difficulty, weakness and light-headedness.       Tingling in toes sometimes, better after moves around.   Psychiatric/Behavioral:  Negative for confusion and sleep disturbance. The patient is not nervous/anxious.     Health Maintenance  Topic Date Due   Zoster Vaccines- Shingrix (1 of 2) Never done   Medicare Annual Wellness (AWV)  02/05/2022   COVID-19 Vaccine (4 - Moderna risk series) 07/11/2022   Pneumonia Vaccine 81+ Years old  Completed   INFLUENZA VACCINE  Completed   DEXA SCAN  Completed   HPV VACCINES  Aged Out    Physical Exam: Vitals:   11/13/22 1312  BP: 130/70  Pulse: 75  Resp: 16  Temp: 98 F (36.7 C)  SpO2: 95%  Weight: 174 lb (78.9 kg)  Height: '5\' 5"'$  (1.651 m)   Body mass index is 28.96 kg/m. Physical Exam Vitals and nursing note reviewed.  Constitutional:      Appearance: Normal appearance.  HENT:     Head: Normocephalic and atraumatic.     Mouth/Throat:     Mouth: Mucous membranes are moist.  Eyes:     Extraocular Movements: Extraocular movements intact.  Conjunctiva/sclera: Conjunctivae normal.     Pupils: Pupils are equal, round, and reactive to light.  Cardiovascular:     Rate and Rhythm: Normal rate and regular rhythm.     Heart sounds: No murmur heard. Pulmonary:     Effort: Pulmonary effort is normal.     Breath sounds: Normal breath sounds. No rales.  Abdominal:     General: Bowel sounds are normal.     Palpations: Abdomen is soft.     Tenderness: There is no abdominal tenderness.  Musculoskeletal:     Cervical back: Normal range of motion and neck supple.     Right lower leg: No edema.     Left lower leg: No edema.  Skin:    General: Skin is warm and dry.  Neurological:     General: No focal deficit present.     Mental Status: She is alert and oriented to person, place, and time. Mental status is at baseline.     Gait: Gait abnormal.  Psychiatric:        Mood and Affect: Mood normal.        Behavior: Behavior normal.        Thought Content: Thought content normal.        Judgment: Judgment normal.     Labs reviewed: Basic Metabolic Panel: Recent Labs    01/05/22 1644 06/11/22 1135  NA 141 143  K 3.4* 3.5  CL 103 106  CO2 29 29  GLUCOSE 112* 85  BUN 23 35*  CREATININE 0.72 0.97*  CALCIUM 9.4 9.8  TSH  --  5.07*   Liver Function Tests: Recent Labs    06/11/22 1135  AST 19  ALT 13  BILITOT 0.5  PROT 6.3   No results for input(s): "LIPASE", "AMYLASE" in the last 8760 hours. No results for input(s): "AMMONIA" in the last 8760 hours. CBC: Recent Labs    01/05/22 1644 06/11/22 1135  WBC 11.4* 7.0  NEUTROABS  --  3,696  HGB 11.5* 11.5*  HCT 35.9* 36.3  MCV 95.7 95.5  PLT 166 206   Lipid Panel: Recent Labs    06/11/22 1135  CHOL 139  HDL 56  LDLCALC 70  TRIG 57  CHOLHDL 2.5   Lab Results  Component Value Date   HGBA1C 5.7 (H) 06/11/2022    Procedures since last visit: No results found.  Assessment/Plan  Atrial flutter (Marianne) 01/05/22, no recurrence, delay cardiology  Edema   trace, on Hyzaar, TED  Osteopenia after menopause declined Alendronate after initial consented.   Osteoarthritis, multiple sites Back pain mid to lower portion, better, X-ray T/L spine showed multilevel degenerative changes, no acute findings CT head/C+T+L spine, and pelvis,  taking Tylenol, Tramadol             Bone spur R foot, callus of foot, f/u podiatrist.   Allergic rhinitis  off Fluticasone nasal spray qd, on Claritin, Atrovent nasal spray.   Insomnia secondary to anxiety  Lorazepam prn available to her.   Elevated TSH   Elevated TSH 5.07 06/11/22, f/u TSH pending  HTN (hypertension) takes  Losartan/HCT, Metoprolol,  takes ASA, Atorvastatin. Bun/creat 35/0.97 06/11/22  GERD (gastroesophageal reflux disease) stable on Omeprazole, Hgb 11.5 06/11/22  Hyperlipidemia  takes Atorvastatin, LDL 70 06/11/22  Prediabetes  diet controlled, Hgb a1c 5.7 06/11/22  OSA (obstructive sleep apnea) CPAP, sleep study.    Labs/tests ordered: pending TSH  Next appt:  3 months. 2 weels AWV

## 2022-11-13 NOTE — Assessment & Plan Note (Signed)
Back pain mid to lower portion, better, X-ray T/L spine showed multilevel degenerative changes, no acute findings CT head/C+T+L spine, and pelvis,  taking Tylenol, Tramadol             Bone spur R foot, callus of foot, f/u podiatrist.  

## 2022-11-13 NOTE — Assessment & Plan Note (Signed)
takes  Losartan/HCT, Metoprolol,  takes ASA, Atorvastatin. Bun/creat 35/0.97 06/11/22

## 2022-11-13 NOTE — Assessment & Plan Note (Signed)
stable on Omeprazole, Hgb 11.5 06/11/22

## 2022-11-13 NOTE — Assessment & Plan Note (Signed)
diet controlled, Hgb a1c 5.7 06/11/22 

## 2022-11-13 NOTE — Assessment & Plan Note (Signed)
takes Atorvastatin, LDL 70 06/11/22 

## 2022-11-13 NOTE — Assessment & Plan Note (Signed)
declined Alendronate after initial consented.

## 2022-11-14 ENCOUNTER — Encounter: Payer: Self-pay | Admitting: Nurse Practitioner

## 2022-11-27 ENCOUNTER — Other Ambulatory Visit: Payer: Self-pay | Admitting: Nurse Practitioner

## 2022-12-04 ENCOUNTER — Encounter: Payer: Self-pay | Admitting: Nurse Practitioner

## 2022-12-04 ENCOUNTER — Ambulatory Visit (INDEPENDENT_AMBULATORY_CARE_PROVIDER_SITE_OTHER): Payer: PPO | Admitting: Nurse Practitioner

## 2022-12-04 VITALS — BP 116/78 | HR 69 | Temp 97.8°F | Resp 18 | Wt 168.0 lb

## 2022-12-04 DIAGNOSIS — E782 Mixed hyperlipidemia: Secondary | ICD-10-CM

## 2022-12-04 DIAGNOSIS — I10 Essential (primary) hypertension: Secondary | ICD-10-CM | POA: Diagnosis not present

## 2022-12-04 DIAGNOSIS — Z0000 Encounter for general adult medical examination without abnormal findings: Secondary | ICD-10-CM | POA: Diagnosis not present

## 2022-12-04 MED ORDER — ATORVASTATIN CALCIUM 20 MG PO TABS: 20.0000 mg | ORAL_TABLET | Freq: Every day | ORAL | 1 refills | Status: DC

## 2022-12-04 MED ORDER — LOSARTAN POTASSIUM-HCTZ 100-12.5 MG PO TABS: 1.0000 | ORAL_TABLET | Freq: Every day | ORAL | 5 refills | Status: DC

## 2022-12-04 NOTE — Progress Notes (Signed)
Subjective:   Jean [name] is a 88 y.o. female who presents for Medicare Annual (Subsequent) preventive examination in the Clinic [facility]     Objective:    Today's Vitals   12/04/22 1647  BP: 116/78  Pulse: 69  Resp: 18  Temp: 97.8 F (36.6 C)  SpO2: 96%  Weight: 168 lb (76.2 kg)   Body mass index is 27.96 kg/m.     12/04/2022    9:47 AM 01/05/2022    4:40 PM 11/28/2021    3:23 PM 08/01/2021    2:36 PM 07/25/2021    3:34 PM 06/20/2021    3:01 PM 06/06/2021    1:06 PM  Advanced Directives  Does Patient Have a Medical Advance Directive? Yes No Yes Yes Yes Yes Yes  Type of Advance Directive Living will;Out of facility DNR (pink MOST or yellow form)  [place];Living will;Out of facility DNR (pink MOST or yellow form) [place];Living will;Out of facility DNR (pink MOST or yellow form) [place];Living will [place];Living will;Out of facility DNR (pink MOST or yellow form) [place];Living will;Out of facility DNR (pink MOST or yellow form)  Does patient want to make changes to medical advance directive? No - Patient declined  No - Patient declined No - Patient declined No - Patient declined No - Patient declined No - Patient declined  Copy of [place] in Chart?   Yes - validated most recent copy scanned in chart (See row information) Yes - validated most recent copy scanned in chart (See row information) Yes - validated most recent copy scanned in chart (See row information) Yes - validated most recent copy scanned in chart (See row information) Yes - validated most recent copy scanned in chart (See row information)  Would patient like information on creating a medical advance directive?  No - Patient declined       Pre-existing out of facility DNR order (yellow form or pink MOST form)   Pink MOST form placed in chart (order not valid for inpatient use) Pink MOST form placed in  chart (order not valid for inpatient use) Yellow form placed in chart (order not valid for inpatient use);Pink MOST form placed in chart (order not valid for inpatient use) Yellow form placed in chart (order not valid for inpatient use) Pink MOST form placed in chart (order not valid for inpatient use)    Current Medications (verified) Outpatient Encounter Medications as of 12/04/2022  Medication Sig   acetaminophen (TYLENOL) 500 MG tablet Take 500 mg by mouth daily.    aspirin EC 81 MG tablet Take 1 tablet (81 mg total) by mouth daily.   Calcium Carb-Cholecalciferol 600-800 MG-UNIT TABS Take 1 tablet by mouth 2 (two) times daily.    loratadine (CLARITIN) 10 MG tablet Take 10 mg by mouth daily.   LORazepam (ATIVAN) 0.5 MG tablet TAKE ONE TABLET ONCE DAILY AS NEEDED FOR ANXIETY.   metoprolol succinate (TOPROL-XL) 25 MG 24 hr tablet TAKE 1/2 TABLET ONCE A DAY.   Multiple Vitamins-Minerals (OCUVITE ADULT 50+) CAPS Take 1 capsule by mouth every evening.   omeprazole (PRILOSEC) 40 MG capsule TAKE 1 CAPSULE DAILY 1/2 HOUR BEFORE BREAKFAST FOR STOMACH ACID.   Polyethyl Glycol-Propyl Glycol (SYSTANE) 0.4-0.3 % GEL ophthalmic gel Place 1 application into both eyes daily.   traMADol (ULTRAM) 50 MG tablet Take 1 tablet (50 mg total) by mouth every 6 (six) hours as needed.  TRIAMCINOLONE ACETONIDE,NASAL, NA Place 2 sprays into the nose. Each nostril   [DISCONTINUED] atorvastatin (LIPITOR) 20 MG tablet TAKE ONE TABLET BY MOUTH ONCE DAILY   [DISCONTINUED] losartan-hydrochlorothiazide (HYZAAR) 100-12.5 MG tablet Take one tablet by mouth once daily for high blood pressure.   atorvastatin (LIPITOR) 20 MG tablet Take 1 tablet (20 mg total) by mouth daily.   losartan-hydrochlorothiazide (HYZAAR) 100-12.5 MG tablet Take 1 tablet by mouth daily.   No facility-administered encounter medications on file as of 12/04/2022.    Allergies (verified) Adhesive [tape], Ampicillin, Elemental sulfur, Nitrofuran derivatives,  and Zoloft [sertraline hcl]   History: Past Medical History:  Diagnosis Date   GERD (gastroesophageal reflux disease)    Hx of cataract surgery    both eyes   Hyperglycemia    Hyperlipidemia    Hypertension    Lumbago 04/05/2016   Macular degeneration    ? which eye(s)   Obstructive sleep apnea    Osteoporosis, senile    Overactive bladder    Squamous cell skin cancer 2/16   left lower leg   Past Surgical History:  Procedure Laterality Date   ABDOMINAL HYSTERECTOMY  1960's   APPENDECTOMY  1960's   MOLE REMOVAL  2000   facial   TONSILLECTOMY     Family History  Problem Relation Age of Onset   Heart disease Mother    Heart disease Father    Social History   Socioeconomic History   Marital status: Widowed    Spouse name: Not on file   Number of children: Not on file   Years of education: Not on file   Highest education level: Not on file  Occupational History   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  Tobacco Use   Smoking status: Never   Smokeless tobacco: Never  Vaping Use   Vaping Use: Never used  Substance and Sexual Activity   Alcohol use: Not Currently    Comment: occassionally 1/2 glass of wine 1-2 x month   Drug use: No   Sexual activity: Never  Other Topics Concern   Not on file  Social History Narrative   Lives at [facility] since 11/12/15   Widow   Never smoked   Alcohol occasionally 1/2 glass of wine   Exercise none   POA, Living Will   Walks with walker   Social Determinants of Health   Financial Resource Strain: Not on file  Food Insecurity: Not on file  Transportation Needs: Not on file  Physical Activity: Not on file  Stress: Not on file  Social Connections: Not on file    Tobacco Counseling Counseling given: Not Answered   Clinical Intake:  Pre-visit preparation completed: Yes  Pain : No/denies pain     Nutritional Risks: None Diabetes: No  How often do you need to have someone help you when you read  instructions, pamphlets, or other written materials from your doctor or pharmacy?: 1 - Never What is the last grade level you completed in school?: senior high school  Diabetic? no  Interpreter Needed?: No  Information entered by :: Govind Furey Bretta Bang NP   Activities of Daily Living    12/04/2022    4:04 PM  In your present state of health, do you have any difficulty performing the following activities:  Hearing? 0  Vision? 0  Difficulty concentrating or making decisions? 0  Walking or climbing stairs? 0  Dressing or bathing? 0  Doing errands, shopping? 0  Preparing Food and eating ? N  Using the Toilet? N  In the past six months, have you accidently leaked urine? N  Do you have problems with loss of bowel control? Y  Comment occasionally  Managing your Medications? N  Managing your Finances? N  Housekeeping or managing your Housekeeping? N    Patient Care Team: Denny Lave X, NP as PCP - General (Internal Medicine) Gayatri Teasdale X, NP as Nurse Practitioner (Internal Medicine)  Indicate any recent Medical Services you may have received from other than [facility] providers in the past year (date may be approximate).     Assessment:   This is a routine wellness examination for English.  Hearing/Vision screen No results found.  Dietary issues and exercise activities discussed: Current Exercise Habits: The patient does not participate in regular exercise at present, Exercise limited by: cardiac condition(s)   Goals Addressed             This Visit's Progress    Maintain Mobility and Function       Evidence-based guidance:  Acknowledge and validate impact of pain, loss of strength and potential disfigurement (hand osteoarthritis) on mental health and daily life, such as social isolation, anxiety, depression, impaired sexual relationship and   injury from falls.  Anticipate referral to physical or occupational therapy for assessment, therapeutic exercise and recommendation for adaptive  equipment or assistive devices; encourage participation.  Assess impact on ability to perform activities of daily living, as well as engage in sports and leisure events or requirements of work or school.  Provide anticipatory guidance and reassurance about the benefit of exercise to maintain function; acknowledge and normalize fear that exercise may worsen symptoms.  Encourage regular exercise, at least 10 minutes at a time for 45 minutes per week; consider yoga, water exercise and proprioceptive exercises; encourage use of wearable activity tracker to increase motivation and adherence.  Encourage maintenance or resumption of daily activities, including employment, as pain allows and with minimal exposure to trauma.  Assist patient to advocate for adaptations to the work environment.  Consider level of pain and function, gender, age, lifestyle, patient preference, quality of life, readiness and ?ocapacity to benefit? when recommending patients for orthopaedic surgery consultation.  Explore strategies, such as changes to medication regimen or activity that enables patient to anticipate and manage flare-ups that increase deconditioning and disability.  Explore patient preferences; encourage exposure to a broader range of activities that have been avoided for fear of experiencing pain.  Identify barriers to participation in therapy or exercise, such as pain with activity, anticipated or imagined pain.  Monitor postoperative joint replacement or any preexisting joint replacement for ongoing pain and loss of function; provide social support and encouragement throughout recovery.   Notes:        Depression Screen    02/05/2021    2:08 PM 10/28/2018    3:33 PM 11/03/2017   11:08 AM 09/23/2016   10:43 AM 05/29/2016    1:33 PM 04/17/2016    3:00 PM 11/29/2015    4:07 PM  PHQ 2/9 Scores  PHQ - 2 Score 0 0 0 1 0 1 0    Fall Risk    12/04/2022    9:48 AM 02/06/2022    2:35 PM 08/01/2021    2:35 PM  07/25/2021    3:33 PM 06/20/2021    3:01 PM  Fall Risk   Falls in the past year? 0 0 0 1 1  Number falls in past yr: 0 0 0 1 1  Injury with  Fall? 0 0 0 1 1  Risk for fall due to : No Fall Risks History of fall(s) No Fall Risks History of fall(s) History of fall(s)  Follow up Falls evaluation completed Falls evaluation completed;Education provided;Falls prevention discussed Falls evaluation completed Falls evaluation completed Falls evaluation completed    FALL RISK PREVENTION PERTAINING TO THE HOME:  Any stairs in or around the home? Yes  If so, are there any without handrails? No  Home free of loose throw rugs in walkways, pet beds, electrical cords, etc? Yes  Adequate lighting in your home to reduce risk of falls? Yes   ASSISTIVE DEVICES UTILIZED TO PREVENT FALLS:  Life alert? No  Use of a cane, walker or w/c? Yes  Grab bars in the bathroom? Yes  Shower chair or bench in shower? Yes  Elevated toilet seat or a handicapped toilet? Yes   TIMED UP AND GO:  Was the test performed? Yes .  Length of time to ambulate 10 feet: 12 sec.   Gait slow and steady with assistive device  Cognitive Function:    12/04/2022    4:08 PM 09/15/2019    4:40 PM 08/25/2019    3:18 PM 07/20/2018    9:49 AM 11/03/2017   11:09 AM  MMSE - Mini Mental State Exam  Orientation to time '5 4 5 5 5  '$ Orientation to Place '5 5 5 5 5  '$ Registration '3 3 3 3 3  '$ Attention/ Calculation '5 5 5 5 5  '$ Recall 1 3 0 1 2  Language- name 2 objects '2 2 2 2 2  '$ Language- repeat '1 1 1 1 1  '$ Language- follow 3 step command '3 3 3 3 3  '$ Language- read & follow direction '1 1 1 1 1  '$ Write a sentence '1 1 1 1 1  '$ Copy design '1 1 1 1 1  '$ Total score '28 29 27 28 29        '$ 02/05/2021    2:14 PM  6CIT Screen  What Year? 0 points  What month? 0 points  What time? 0 points  Count back from 20 0 points  Months in reverse 0 points  Repeat phrase 2 points  Total Score 2 points    Immunizations Immunization History  Administered  Date(s) Administered   Fluad Quad(high Dose 65+) 10/12/2019, 10/14/2022   Influenza Whole 09/30/2018   Influenza, High Dose Seasonal PF 10/07/2017, 09/28/2021   Influenza-Unspecified 09/27/2015, 10/09/2016, 10/10/2020   Moderna Sars-Covid-2 Vaccination 01/02/2020, 01/30/2020, 11/06/2020   Pfizer Fall 2023 Covid-19 Vaccine 49mo thru 464yr 05/16/2022   Pneumococcal Conjugate-13 01/23/2015   Pneumococcal Polysaccharide-23 10/20/2007   Td 12/14/2012    TDAP status: Up to date  Flu Vaccine status: Up to date  Pneumococcal vaccine status: Up to date  Covid-19 vaccine status: Completed vaccines  Qualifies for Shingles Vaccine? Yes   Zostavax completed Yes   Shingrix Completed?: No.    Education has been provided regarding the importance of this vaccine. Patient has been advised to call insurance company to determine out of pocket expense if they have not yet received this vaccine. Advised may also receive vaccine at local pharmacy or Health Dept. Verbalized acceptance and understanding.  Screening Tests Health Maintenance  Topic Date Due   Zoster Vaccines- Shingrix (1 of 2) Never done   COVID-19 Vaccine (4 - 2023-24 season) 08/29/2022   DTaP/Tdap/Td (2 - Tdap) 12/14/2022   Medicare Annual Wellness (AWV)  12/05/2023   Pneumonia Vaccine 6538Years old  Completed   INFLUENZA VACCINE  Completed   DEXA SCAN  Completed   HPV VACCINES  Aged Out    Health Maintenance  Health Maintenance Due  Topic Date Due   Zoster Vaccines- Shingrix (1 of 2) Never done   COVID-19 Vaccine (4 - 2023-24 season) 08/29/2022    Colorectal cancer screening: No longer required.   Mammogram status: No longer required due to age.  Bone Density status: Completed 07/16/21. Results reflect: Bone density results: OSTEOPENIA. Repeat every 2-3 years.  Lung Cancer Screening: (Low Dose CT Chest recommended if Age 86-80 years, 30 pack-year currently smoking OR have quit w/in 15years.) does not qualify.   Lung  Cancer Screening Referral: no  Additional Screening:  Hepatitis C Screening: does not qualify; Completed no  Vision Screening: Recommended annual ophthalmology exams for early detection of glaucoma and other disorders of the eye. Is the patient up to date with their annual eye exam?  Yes  Who is the provider or what is the name of the office in which the patient attends annual eye exams? Dr. Ruby Cola If pt is not established with a provider, would they like to be referred to a provider to establish care? No .   Dental Screening: Recommended annual dental exams for proper oral hygiene  Community Resource Referral / Chronic Care Management: CRR required this visit?  No   CCM required this visit?  No      Plan:     I have personally reviewed and noted the following in the patient's chart:   Medical and social history Use of alcohol, tobacco or illicit drugs  Current medications and supplements including opioid prescriptions. Patient is not currently taking opioid prescriptions. Functional ability and status Nutritional status Physical activity Advanced directives List of other physicians Hospitalizations, surgeries, and ER visits in previous 12 months Vitals Screenings to include cognitive, depression, and falls Referrals and appointments  In addition, I have reviewed and discussed with patient certain preventive protocols, quality metrics, and best practice recommendations. A written personalized care plan for preventive services as well as general preventive health recommendations were provided to patient.     Daylan Boggess X Nicholaos Schippers, NP   12/04/2022

## 2023-01-06 ENCOUNTER — Other Ambulatory Visit: Payer: Self-pay | Admitting: *Deleted

## 2023-01-06 MED ORDER — LORAZEPAM 0.5 MG PO TABS: ORAL_TABLET | ORAL | 0 refills | Status: DC

## 2023-01-06 NOTE — Telephone Encounter (Signed)
Patient called requesting refill.  Epic LR: 08/13/2022  Pended Rx and sent to [facility] for approval.

## 2023-01-07 ENCOUNTER — Ambulatory Visit (HOSPITAL_BASED_OUTPATIENT_CLINIC_OR_DEPARTMENT_OTHER): Payer: PPO | Attending: Nurse Practitioner | Admitting: Internal Medicine

## 2023-01-07 DIAGNOSIS — I493 Ventricular premature depolarization: Secondary | ICD-10-CM | POA: Insufficient documentation

## 2023-01-07 DIAGNOSIS — G4733 Obstructive sleep apnea (adult) (pediatric): Secondary | ICD-10-CM

## 2023-01-07 DIAGNOSIS — R5383 Other fatigue: Secondary | ICD-10-CM | POA: Insufficient documentation

## 2023-01-07 DIAGNOSIS — I10 Essential (primary) hypertension: Secondary | ICD-10-CM | POA: Diagnosis not present

## 2023-01-07 DIAGNOSIS — E669 Obesity, unspecified: Secondary | ICD-10-CM | POA: Insufficient documentation

## 2023-01-07 DIAGNOSIS — R0683 Snoring: Secondary | ICD-10-CM | POA: Insufficient documentation

## 2023-01-11 DIAGNOSIS — G4733 Obstructive sleep apnea (adult) (pediatric): Secondary | ICD-10-CM

## 2023-01-11 NOTE — Procedures (Signed)
    Patient Name: Jean [name], Jean [name] Date: 01/07/2023 Gender: Female D.O.B: 1924-03-30 Age (years): 89 Referring Provider: Man Mast NP Height (inches): 65 Interpreting Physician: Baird Lyons MD, ABSM Weight (lbs): 171.40 RPSGT: Carolin Coy BMI: 29 MRN: [id number]  CLINICAL INFORMATION Sleep Study Type: NPSG Indication for sleep study: Daytime Fatigue, Hypertension, Obesity, Snoring Epworth Sleepiness Score: 9  SLEEP STUDY TECHNIQUE As per the AASM Manual for the Scoring of Sleep and Associated Events v2.3 (April 2016) with a hypopnea requiring 4% desaturations.  The channels recorded and monitored were frontal, central and occipital EEG, electrooculogram (EOG), submentalis EMG (chin), nasal and oral airflow, thoracic and abdominal wall motion, anterior tibialis EMG, snore microphone, electrocardiogram, and pulse oximetry.  MEDICATIONS Medications self-administered by patient taken the night of the study : ASPIRIN, ATORVASTATIN, calicium-cholecalciferol D3, SYSTANE  SLEEP ARCHITECTURE The study was initiated at 10:56:12 PM and ended at 5:10:44 AM.  Sleep onset time was 8.4 minutes and the sleep efficiency was 87.8%. The total sleep time was 329 minutes.  Stage REM latency was 62.0 minutes.  The patient spent 21.7% of the night in stage N1 sleep, 63.7% in stage N2 sleep, 0.0% in stage N3 and 14.6% in REM.  Alpha intrusion was absent.  Supine sleep was 22.72%.  RESPIRATORY PARAMETERS The overall apnea/hypopnea index (AHI) was 50.7 per hour. There were 49 total apneas, including 25 obstructive, 13 central and 11 mixed apneas. There were 229 hypopneas and 40 RERAs.  The AHI during Stage REM sleep was 46.3 per hour.  AHI while supine was 52.2 per hour.  The mean oxygen saturation was 94.7%. The minimum SpO2 during sleep was 80.0%.  soft snoring was noted during this study.  CARDIAC DATA The 2 lead EKG demonstrated sinus rhythm. The mean heart rate was 66.5 beats  per minute. Other EKG findings include: PVCs. LEG MOVEMENT DATA The total PLMS were 0 with a resulting PLMS index of 0.0. Associated arousal with leg movement index was 0.0 .  IMPRESSIONS - Severe obstructive sleep apnea occurred during this study (AHI = 50.7/h). - No significant central sleep apnea occurred during this study (CAI = 2.4/h). - Moderate oxygen desaturation was noted during this study (Min O2 = 80.0%). Mean 94.7% - The patient snored with soft snoring volume. - EKG findings include PVCs. - Clinically significant periodic limb movements did not occur during sleep. No significant associated arousals.  DIAGNOSIS - Obstructive Sleep Apnea (G47.33)  RECOMMENDATIONS - Suggest CPAP titration sleep study or autopap. Other options would be based on clinical judgment. - Be careful with alcohol, sedatives and other CNS depressants that may worsen sleep apnea and disrupt normal sleep architecture. - Sleep hygiene should be reviewed to assess factors that may improve sleep quality. - Weight management and regular exercise should be initiated or continued if appropriate.  [Electronically signed] 01/11/2023 12:08 PM  Baird Lyons MD, [place], American Board of Sleep Medicine NPI: [id number]                         [place] [place] [facility]  ELECTRONICALLY SIGNED ON:  01/11/2023, 12:05 PM [place] PH: [phone]   FX: [phone] [place]

## 2023-01-16 ENCOUNTER — Encounter (HOSPITAL_COMMUNITY): Payer: Self-pay

## 2023-01-16 ENCOUNTER — Other Ambulatory Visit: Payer: Self-pay

## 2023-01-16 ENCOUNTER — Emergency Department (HOSPITAL_COMMUNITY): Payer: PPO

## 2023-01-16 ENCOUNTER — Emergency Department (HOSPITAL_COMMUNITY)
Admission: EM | Admit: 2023-01-16 | Discharge: 2023-01-17 | Disposition: A | Discharge status: Skilled Nursing Facility | Payer: PPO | Attending: Emergency Medicine | Admitting: Emergency Medicine

## 2023-01-16 DIAGNOSIS — M1711 Unilateral primary osteoarthritis, right knee: Secondary | ICD-10-CM | POA: Diagnosis not present

## 2023-01-16 DIAGNOSIS — M25561 Pain in right knee: Secondary | ICD-10-CM | POA: Insufficient documentation

## 2023-01-16 DIAGNOSIS — Z043 Encounter for examination and observation following other accident: Secondary | ICD-10-CM | POA: Diagnosis not present

## 2023-01-16 DIAGNOSIS — M542 Cervicalgia: Secondary | ICD-10-CM | POA: Diagnosis not present

## 2023-01-16 DIAGNOSIS — S80919A Unspecified superficial injury of unspecified knee, initial encounter: Secondary | ICD-10-CM | POA: Diagnosis not present

## 2023-01-16 DIAGNOSIS — R55 Syncope and collapse: Secondary | ICD-10-CM | POA: Diagnosis not present

## 2023-01-16 DIAGNOSIS — I10 Essential (primary) hypertension: Secondary | ICD-10-CM | POA: Diagnosis not present

## 2023-01-16 DIAGNOSIS — S0990XA Unspecified injury of head, initial encounter: Secondary | ICD-10-CM | POA: Diagnosis not present

## 2023-01-16 DIAGNOSIS — I959 Hypotension, unspecified: Secondary | ICD-10-CM | POA: Diagnosis not present

## 2023-01-16 DIAGNOSIS — W19XXXA Unspecified fall, initial encounter: Secondary | ICD-10-CM | POA: Diagnosis not present

## 2023-01-16 DIAGNOSIS — R42 Dizziness and giddiness: Secondary | ICD-10-CM | POA: Diagnosis not present

## 2023-01-16 DIAGNOSIS — Z85828 Personal history of other malignant neoplasm of skin: Secondary | ICD-10-CM | POA: Insufficient documentation

## 2023-01-16 DIAGNOSIS — S199XXA Unspecified injury of neck, initial encounter: Secondary | ICD-10-CM | POA: Diagnosis not present

## 2023-01-16 LAB — COMPREHENSIVE METABOLIC PANEL
ALT: 17 U/L (ref 0–44)
AST: 26 U/L (ref 15–41)
Albumin: 3.7 g/dL (ref 3.5–5.0)
Alkaline Phosphatase: 60 U/L (ref 38–126)
Anion gap: 10 (ref 5–15)
BUN: 29 mg/dL — ABNORMAL HIGH (ref 8–23)
CO2: 24 mmol/L (ref 22–32)
Calcium: 9.5 mg/dL (ref 8.9–10.3)
Chloride: 105 mmol/L (ref 98–111)
Creatinine, Ser: 0.88 mg/dL (ref 0.44–1.00)
GFR, Estimated: >60 mL/min (ref >60)
Glucose, Bld: 105 mg/dL — ABNORMAL HIGH (ref 70–99)
Potassium: 3.1 mmol/L — ABNORMAL LOW (ref 3.5–5.1)
Sodium: 139 mmol/L (ref 135–145)
Total Bilirubin: 0.2 mg/dL — ABNORMAL LOW (ref 0.3–1.2)
Total Protein: 6.1 g/dL — ABNORMAL LOW (ref 6.5–8.1)

## 2023-01-16 LAB — CBC WITH DIFFERENTIAL/PLATELET
Abs Immature Granulocytes: 0.01 10*3/uL (ref 0.00–0.07)
Basophils Absolute: 0.1 10*3/uL (ref 0.0–0.1)
Basophils Relative: 1 %
Eosinophils Absolute: 0.1 10*3/uL (ref 0.0–0.5)
Eosinophils Relative: 2 %
HCT: 35.9 % — ABNORMAL LOW (ref 36.0–46.0)
Hemoglobin: 11.5 g/dL — ABNORMAL LOW (ref 12.0–15.0)
Immature Granulocytes: 0 %
Lymphocytes Relative: 28 %
Lymphs Abs: 1.8 10*3/uL (ref 0.7–4.0)
MCH: 30.7 pg (ref 26.0–34.0)
MCHC: 32 g/dL (ref 30.0–36.0)
MCV: 95.7 fL (ref 80.0–100.0)
Monocytes Absolute: 0.5 10*3/uL (ref 0.1–1.0)
Monocytes Relative: 9 %
Neutro Abs: 3.8 10*3/uL (ref 1.7–7.7)
Neutrophils Relative %: 60 %
Platelets: 179 10*3/uL (ref 150–400)
RBC: 3.75 MIL/uL — ABNORMAL LOW (ref 3.87–5.11)
RDW: 14.9 % (ref 11.5–15.5)
WBC: 6.4 10*3/uL (ref 4.0–10.5)
nRBC: 0 % (ref 0.0–0.2)

## 2023-01-16 MED ORDER — ACETAMINOPHEN 325 MG PO TABS
325.0000 mg | ORAL_TABLET | Freq: Once | ORAL | Status: AC
  Administered 2023-01-16: 325 mg via ORAL
  Filled 2023-01-16: qty 1

## 2023-01-16 NOTE — ED Provider Triage Note (Signed)
Emergency Medicine Provider Triage Evaluation Note  Jean [name] , a 89 y.o. female  was evaluated in triage.  Pt complains of dizziness/fall.  Patient states that she was walking to go get dinner when she felt acute onset visual disturbance described as blurry vision bilaterally and dizziness described as "further floating on face."  Reports episode lasting a matter of seconds.  She subsequently had another episode of similar occurrence occurring minutes later where she subsequently fall landed on her right knee.  Denies trauma to head, loss of consciousness.  She states initially had difficulty moving her right knee but since then knee pain is significantly improved.  Denies of current visual complaints, slurred speech, facial droop, weakness/sensory deficits in upper or lower extremities, gait abnormalities.  Review of Systems  Positive: See above Negative:   Physical Exam  BP (!) 124/56 (BP Location: Left Arm)   Pulse 67   Temp 99.1 F (37.3 C)   Resp 16   SpO2 100%  Gen:   Awake, no distress   Resp:  Normal effort  MSK:   Moves extremities without difficulty  Other:  Cranial nerves II through XII grossly intact.  Patient is all 4 extremities without difficulty.  Patient ambulates independently in room.  Medical Decision Making  Medically screening exam initiated at 7:14 PM.  Appropriate orders placed.  Barnabas Harries was informed that the remainder of the evaluation will be completed by another provider, this initial triage assessment does not replace that evaluation, and the importance of remaining in the ED until their evaluation is complete.     Wilnette Kales, [place] 01/16/23 1921

## 2023-01-16 NOTE — ED Triage Notes (Signed)
[facility] from [facility] d/t 2 sudden onset events of dizziness, the last one actually causing her to have a witnessed fall. Dizziness has since stopped, pt is A/Ox4 & states Rt knee pain post fall but denies any other s/s.  114/58  88 bpm 98% on RA CBG 196

## 2023-01-17 NOTE — ED Notes (Signed)
Pt verbalizes understanding of discharge instructions. Opportunity for questions and answers were provided. Pt discharged from the ED to [facility] via [facility].

## 2023-01-17 NOTE — Discharge Instructions (Signed)
You were evaluated in the Emergency Department and after careful evaluation, we did not find any emergent condition requiring admission or further testing in the hospital.  Your exam/testing today is overall reassuring.  Workup overall reassuring.  Small nodule on your chest x-ray, recommend discussing this with your primary care doctor.  Please return to the Emergency Department if you experience any worsening of your condition.   Thank you for allowing [place] to be a part of your care.

## 2023-01-17 NOTE — ED Provider Notes (Addendum)
[facility] Emergency Department Provider Note MRN:  [id number]  Arrival date & time: 01/17/23     Chief Complaint   Fall and Dizziness   History of Present Illness   Jean [name] is a 89 y.o. year-old female with history of hypertension presenting to the ED with chief complaint of fall.  Patient was walking and then felt lightheaded, felt like she was going to pass out.  She fell down on her right knee and then rolled onto the ground.  As she laid there the lightheadedness subsided.  She has mild right knee pain but currently without any other symptoms.  Never had any chest pain or shortness of breath, no abdominal pain, no headache.  Review of Systems  A thorough review of systems was obtained and all systems are negative except as noted in the HPI and PMH.   Patient's Health History    Past Medical History:  Diagnosis Date   GERD (gastroesophageal reflux disease)    Hx of cataract surgery    both eyes   Hyperglycemia    Hyperlipidemia    Hypertension    Lumbago 04/05/2016   Macular degeneration    ? which eye(s)   Obstructive sleep apnea    Osteoporosis, senile    Overactive bladder    Squamous cell skin cancer 2/16   left lower leg    Past Surgical History:  Procedure Laterality Date   ABDOMINAL HYSTERECTOMY  1960's   APPENDECTOMY  1960's   MOLE REMOVAL  2000   facial   TONSILLECTOMY      Family History  Problem Relation Age of Onset   Heart disease Mother    Heart disease Father     Social History   Socioeconomic History   Marital status: Widowed    Spouse name: Not on file   Number of children: Not on file   Years of education: Not on file   Highest education level: Not on file  Occupational History   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  Tobacco Use   Smoking status: Never   Smokeless tobacco: Never  Vaping Use   Vaping Use: Never used  Substance and Sexual Activity   Alcohol use: Not Currently    Comment:  occassionally 1/2 glass of wine 1-2 x month   Drug use: No   Sexual activity: Never  Other Topics Concern   Not on file  Social History Narrative   Lives at [facility] since 11/12/15   Widow   Never smoked   Alcohol occasionally 1/2 glass of wine   Exercise none   POA, Living Will   Walks with walker   Social Determinants of Health   Financial Resource Strain: Not on file  Food Insecurity: Not on file  Transportation Needs: Not on file  Physical Activity: Not on file  Stress: Not on file  Social Connections: Not on file  Intimate Partner Violence: Not on file     Physical Exam   Vitals:   01/17/23 0222 01/17/23 0539  BP: 123/66 122/70  Pulse: 78 80  Resp: 15 16  Temp: 98.1 F (36.7 C) 98.1 F (36.7 C)  SpO2: 95% 96%    CONSTITUTIONAL: Well-appearing, NAD NEURO/PSYCH:  Alert and oriented x 3, normal and symmetric strength and sensation, normal coordination, normal speech EYES:  eyes equal and reactive ENT/NECK:  no LAD, no JVD CARDIO: Regular rate, well-perfused, normal S1 and S2 PULM:  CTAB no wheezing or rhonchi GI/GU:  non-distended, non-tender  MSK/SPINE:  No gross deformities, no edema SKIN:  no rash, atraumatic   *Additional and/or pertinent findings included in MDM below  Diagnostic and Interventional Summary    EKG Interpretation  Date/Time:  Friday January 16 2023 18:44:11 EST Ventricular Rate:  70 PR Interval:  138 QRS Duration: 106 QT Interval:  426 QTC Calculation: 460 R Axis:   -31 Text Interpretation: Normal sinus rhythm Left axis deviation Pulmonary disease pattern Incomplete right bundle branch block Abnormal ECG When compared with ECG of 05-Jan-2022 16:34, PREVIOUS ECG IS PRESENT Confirmed by Gerlene Fee [id number]) on 01/17/2023 4:44:07 AM       Labs Reviewed  COMPREHENSIVE METABOLIC PANEL - Abnormal; Notable for the following components:      Result Value   Potassium 3.1 (*)    Glucose, Bld 105 (*)    BUN 29 (*)    Total  Protein 6.1 (*)    Total Bilirubin 0.2 (*)    All other components within normal limits  CBC WITH DIFFERENTIAL/PLATELET - Abnormal; Notable for the following components:   RBC 3.75 (*)    Hemoglobin 11.5 (*)    HCT 35.9 (*)    All other components within normal limits    CT Head Wo Contrast  Final Result    CT Cervical Spine Wo Contrast  Final Result    DG Knee Complete 4 Views Right  Final Result      Medications  acetaminophen (TYLENOL) tablet 325 mg (325 mg Oral Given 01/16/23 1932)     Procedures  /  Critical Care Procedures  ED Course and Medical Decision Making  Initial Impression and Ddx Near syncopal episode with lightheadedness and fall.  Nontraumatic on exam, normal vital signs, no neurological deficits.  Suspect benign orthostatic event of some kind with other considerations include electrolyte disturbance, traumatic injury from fall.  Doubt PE, doubt ACS, arrhythmia considered.  Past medical/surgical history that increases complexity of ED encounter: Hypertension, advanced age  Interpretation of Diagnostics I personally reviewed the EKG and my interpretation is as follows: Sinus rhythm  Labs reassuring with no significant blood count or electrolyte disturbance.  Knee x-ray normal, CT imaging reassuring with no acute process  Patient Reassessment and Ultimate Disposition/Management     Patient ambulates without issue, no return of symptoms, no indication for further testing or admission, appropriate for discharge.  Patient management required discussion with the following services or consulting groups:  None  Complexity of Problems Addressed Acute illness or injury that poses threat of life of bodily function  Additional Data Reviewed and Analyzed Further history obtained from: None  Additional Factors Impacting ED Encounter Risk None  Barth Kirks. [place]onia Small, [place] [email or web address]  Final Clinical  Impressions(s) / ED Diagnoses     ICD-10-CM   1. Near syncope  R55     2. Fall, initial encounter  [email or web address]       ED Discharge Orders     None        Discharge Instructions Discussed with and Provided to Patient:     Discharge Instructions      You were evaluated in the Emergency Department and after careful evaluation, we did not find any emergent condition requiring admission or further testing in the hospital.  Your exam/testing today is overall reassuring.  Workup overall reassuring.  Small nodule on your chest x-ray, recommend discussing this with your primary care doctor.  Please return to the Emergency Department if you experience any  worsening of your condition.   Thank you for allowing [place] to be a part of your care.       Maudie Flakes, MD 01/17/23 2591    Maudie Flakes, MD 01/17/23 0289    Maudie Flakes, MD 01/17/23 [phone]

## 2023-01-17 NOTE — ED Notes (Signed)
[phone].

## 2023-02-10 DIAGNOSIS — R7989 Other specified abnormal findings of blood chemistry: Secondary | ICD-10-CM | POA: Diagnosis not present

## 2023-02-11 LAB — TSH: TSH: 5.11 mIU/L — ABNORMAL HIGH (ref 0.40–4.50)

## 2023-02-12 ENCOUNTER — Encounter: Payer: Self-pay | Admitting: Nurse Practitioner

## 2023-02-12 ENCOUNTER — Non-Acute Institutional Stay: Payer: PPO | Admitting: Nurse Practitioner

## 2023-02-12 VITALS — BP 132/80 | HR 95 | Temp 98.0°F | Resp 18 | Ht 65.0 in | Wt 166.0 lb

## 2023-02-12 DIAGNOSIS — I10 Essential (primary) hypertension: Secondary | ICD-10-CM | POA: Diagnosis not present

## 2023-02-12 DIAGNOSIS — K219 Gastro-esophageal reflux disease without esophagitis: Secondary | ICD-10-CM | POA: Diagnosis not present

## 2023-02-12 DIAGNOSIS — Z780 Asymptomatic menopausal state: Secondary | ICD-10-CM

## 2023-02-12 DIAGNOSIS — I4892 Unspecified atrial flutter: Secondary | ICD-10-CM

## 2023-02-12 DIAGNOSIS — R7989 Other specified abnormal findings of blood chemistry: Secondary | ICD-10-CM

## 2023-02-12 DIAGNOSIS — M8580 Other specified disorders of bone density and structure, unspecified site: Secondary | ICD-10-CM

## 2023-02-12 DIAGNOSIS — M159 Polyosteoarthritis, unspecified: Secondary | ICD-10-CM

## 2023-02-12 DIAGNOSIS — E876 Hypokalemia: Secondary | ICD-10-CM | POA: Diagnosis not present

## 2023-02-12 DIAGNOSIS — F419 Anxiety disorder, unspecified: Secondary | ICD-10-CM | POA: Diagnosis not present

## 2023-02-12 DIAGNOSIS — E782 Mixed hyperlipidemia: Secondary | ICD-10-CM

## 2023-02-12 DIAGNOSIS — J309 Allergic rhinitis, unspecified: Secondary | ICD-10-CM | POA: Diagnosis not present

## 2023-02-12 DIAGNOSIS — R911 Solitary pulmonary nodule: Secondary | ICD-10-CM | POA: Diagnosis not present

## 2023-02-12 DIAGNOSIS — R7303 Prediabetes: Secondary | ICD-10-CM

## 2023-02-12 DIAGNOSIS — G4733 Obstructive sleep apnea (adult) (pediatric): Secondary | ICD-10-CM

## 2023-02-12 DIAGNOSIS — R609 Edema, unspecified: Secondary | ICD-10-CM | POA: Diagnosis not present

## 2023-02-12 NOTE — Assessment & Plan Note (Signed)
takes Atorvastatin, LDL 70 06/11/22

## 2023-02-12 NOTE — Assessment & Plan Note (Signed)
Blood pressure is controlled,  takes  Losartan/HCT, Metoprolol,  takes ASA, Atorvastatin.

## 2023-02-12 NOTE — Progress Notes (Signed)
Location:   [place] Room Number: [id number] Place of Service:  Clinic (12) Provider: Marlana Latus NP  Code Status: DNR Goals of Care:     01/07/2023    9:18 PM  Advanced Directives  Does Patient Have a Medical Advance Directive? Yes  Type of Advance Directive Living will;Healthcare Power of [place];Out of facility DNR (pink MOST or yellow form)  Does patient want to make changes to medical advance directive? No - Patient declined     Chief Complaint  Patient presents with   Medical Management of Chronic Issues    Patient is here for a follow up for chronic conditions Patient is also due for updated Tdap vaccine    HPI: Patient is a 89 y.o. female seen today for medical management of chronic diseases.      ED eval 01/16/23 near syncope, fall, EKG, CT head/cervical spine, X-ray R knee, CBC, CMP unremarkable. Except K 3.1  CT head/cervical spine, incidental finding: 6 mm right solid pulmonary nodule within the upper lobe. Per Fleischner Society Guidelines, recommend a non-contrast Chest CT at 6-12 months   Edema BLE, trace, on Hyzaar, TED             OSA, CPAP             A-flutter 01/05/22, no recurrence, delay cardiology OP declined Alendronate after initial consented.  Back pain mid to lower portion, better, X-ray T/L spine showed multilevel degenerative changes, no acute findings CT head/C+T+L spine, and pelvis,  taking Tylenol, Tramadol             Bone spur R foot, callus of foot, f/u podiatrist.              Allergic rhinitis, off Fluticasone nasal spray qd, on Claritin, Atrovent nasal spray.              Anxiety, Lorazepam prn available to her.              Elevated TSH 5.07 06/11/22, TSH 5.11 02/10/23             HTN, takes  Losartan/HCT, Metoprolol,  takes ASA, Atorvastatin.              GERD, stable on Omeprazole, Hgb 11.5 01/16/23             Hyperlipidemia, takes Atorvastatin, LDL 70 06/11/22             Prediabetes, diet controlled, Hgb a1c 5.7 06/11/22                Past Medical History:  Diagnosis Date   GERD (gastroesophageal reflux disease)    Hx of cataract surgery    both eyes   Hyperglycemia    Hyperlipidemia    Hypertension    Lumbago 04/05/2016   Macular degeneration    ? which eye(s)   Obstructive sleep apnea    Osteoporosis, senile    Overactive bladder    Squamous cell skin cancer 2/16   left lower leg    Past Surgical History:  Procedure Laterality Date   ABDOMINAL HYSTERECTOMY  1960's   APPENDECTOMY  1960's   MOLE REMOVAL  2000   facial   TONSILLECTOMY      Allergies  Allergen Reactions   Adhesive [Tape]    Ampicillin    Elemental Sulfur    Nitrofuran Derivatives    Zoloft [Sertraline Hcl] Other (See Comments)    Dizziness, nausea, faint feeling, and nightmares  Allergies as of 02/12/2023       Reactions   Adhesive [tape]    Ampicillin    Elemental Sulfur    Nitrofuran Derivatives    Zoloft [sertraline Hcl] Other (See Comments)   Dizziness, nausea, faint feeling, and nightmares          Medication List        Accurate as of February 12, 2023 11:59 PM. If you have any questions, ask your nurse or doctor.          acetaminophen 500 MG tablet Commonly known as: TYLENOL Take 500 mg by mouth daily.   aspirin EC 81 MG tablet Take 1 tablet (81 mg total) by mouth daily.   atorvastatin 20 MG tablet Commonly known as: LIPITOR Take 1 tablet (20 mg total) by mouth daily.   Calcium Carb-Cholecalciferol 600-800 MG-UNIT Tabs Take 1 tablet by mouth 2 (two) times daily.   loratadine 10 MG tablet Commonly known as: CLARITIN Take 10 mg by mouth daily.   LORazepam 0.5 MG tablet Commonly known as: ATIVAN TAKE ONE TABLET ONCE DAILY AS NEEDED FOR ANXIETY.   losartan-hydrochlorothiazide 100-12.5 MG tablet Commonly known as: HYZAAR Take 1 tablet by mouth daily.   metoprolol succinate 25 MG 24 hr tablet Commonly known as: TOPROL-XL TAKE 1/2 TABLET ONCE A DAY.   Ocuvite Adult 50+  Caps Take 1 capsule by mouth every evening.   omeprazole 40 MG capsule Commonly known as: PRILOSEC TAKE 1 CAPSULE DAILY 1/2 HOUR BEFORE BREAKFAST FOR STOMACH ACID.   Systane 0.4-0.3 % Gel ophthalmic gel Generic drug: Polyethyl Glycol-Propyl Glycol Place 1 application into both eyes daily.   traMADol 50 MG tablet Commonly known as: ULTRAM Take 1 tablet (50 mg total) by mouth every 6 (six) hours as needed.   TRIAMCINOLONE ACETONIDE(NASAL) NA Place 2 sprays into the nose. Each nostril        Review of Systems:  Review of Systems  Constitutional:  Negative for appetite change, fatigue and fever.  HENT:  Negative for congestion, hearing loss, trouble swallowing and voice change.        Improved.   Eyes:  Negative for visual disturbance.  Respiratory:  Negative for cough and shortness of breath.   Cardiovascular:  Negative for leg swelling.  Gastrointestinal:  Negative for abdominal pain and constipation.       X1-2 times in the past 3 months, diet adjustment helped.   Genitourinary:  Negative for dysuria and urgency.       0-1x/night urination, occasionally urinary leakage.   Musculoskeletal:  Positive for arthralgias, back pain and gait problem.  Skin:  Negative for color change.  Neurological:  Negative for dizziness, speech difficulty, weakness and light-headedness.       Tingling in toes sometimes, better after moves around.   Psychiatric/Behavioral:  Negative for confusion and sleep disturbance. The patient is not nervous/anxious.     Health Maintenance  Topic Date Due   DTaP/Tdap/Td (2 - Tdap) 12/14/2022   COVID-19 Vaccine (5 - 2023-24 season) 03/30/2023 (Originally 12/25/2022)   Zoster Vaccines- Shingrix (1 of 2) 12/29/2094 (Originally 01/23/1943)   Medicare Annual Wellness (AWV)  12/05/2023   Pneumonia Vaccine 6+ Years old  Completed   INFLUENZA VACCINE  Completed   DEXA SCAN  Completed   HPV VACCINES  Aged Out    Physical Exam: Vitals:   02/12/23 1453  BP:  132/80  Pulse: 95  Resp: 18  Temp: 98 F (36.7 C)  SpO2: 99%  Weight: 166  lb (75.3 kg)  Height: 5' 5"$  (1.651 m)   Body mass index is 27.62 kg/m. Physical Exam Vitals and nursing note reviewed.  Constitutional:      Appearance: Normal appearance.  HENT:     Head: Normocephalic and atraumatic.     Mouth/Throat:     Mouth: Mucous membranes are moist.  Eyes:     Extraocular Movements: Extraocular movements intact.     Conjunctiva/sclera: Conjunctivae normal.     Pupils: Pupils are equal, round, and reactive to light.  Cardiovascular:     Rate and Rhythm: Normal rate and regular rhythm.     Heart sounds: No murmur heard. Pulmonary:     Effort: Pulmonary effort is normal.     Breath sounds: Normal breath sounds. No rales.  Abdominal:     General: Bowel sounds are normal.     Palpations: Abdomen is soft.     Tenderness: There is no abdominal tenderness.  Musculoskeletal:     Cervical back: Normal range of motion and neck supple.     Right lower leg: No edema.     Left lower leg: No edema.  Skin:    General: Skin is warm and dry.  Neurological:     General: No focal deficit present.     Mental Status: She is alert and oriented to person, place, and time. Mental status is at baseline.     Gait: Gait abnormal.  Psychiatric:        Mood and Affect: Mood normal.        Behavior: Behavior normal.        Thought Content: Thought content normal.        Judgment: Judgment normal.     Labs reviewed: Basic Metabolic Panel: Recent Labs    06/11/22 1135 01/16/23 1941 02/10/23 0713  NA 143 139  --   K 3.5 3.1*  --   CL 106 105  --   CO2 29 24  --   GLUCOSE 85 105*  --   BUN 35* 29*  --   CREATININE 0.97* 0.88  --   CALCIUM 9.8 9.5  --   TSH 5.07*  --  5.11*   Liver Function Tests: Recent Labs    06/11/22 1135 01/16/23 1941  AST 19 26  ALT 13 17  ALKPHOS  --  60  BILITOT 0.5 0.2*  PROT 6.3 6.1*  ALBUMIN  --  3.7   No results for input(s): "LIPASE", "AMYLASE"  in the last 8760 hours. No results for input(s): "AMMONIA" in the last 8760 hours. CBC: Recent Labs    06/11/22 1135 01/16/23 1941  WBC 7.0 6.4  NEUTROABS 3,696 3.8  HGB 11.5* 11.5*  HCT 36.3 35.9*  MCV 95.5 95.7  PLT 206 179   Lipid Panel: Recent Labs    06/11/22 1135  CHOL 139  HDL 56  LDLCALC 70  TRIG 57  CHOLHDL 2.5   Lab Results  Component Value Date   HGBA1C 5.7 (H) 06/11/2022    Procedures since last visit: CT Head Wo Contrast  Result Date: 01/16/2023 CLINICAL DATA:  Minor head trauma. Dizziness and fall. Neck trauma. Neck pain. EXAM: CT HEAD WITHOUT CONTRAST CT CERVICAL SPINE WITHOUT CONTRAST TECHNIQUE: Multidetector CT imaging of the head and cervical spine was performed following the standard protocol without intravenous contrast. Multiplanar CT image reconstructions of the cervical spine were also generated. RADIATION DOSE REDUCTION: This exam was performed according to the departmental dose-optimization program which includes automated exposure control,  adjustment of the mA and/or kV according to patient size and/or use of iterative reconstruction technique. COMPARISON:  06/03/2021 FINDINGS: CT HEAD FINDINGS Brain: Diffuse cerebral atrophy. Ventricular dilatation consistent with central atrophy. Low-attenuation changes in the deep white matter consistent with small vessel ischemia. No abnormal extra-axial fluid collections. No mass effect or midline shift. Gray-white matter junctions are distinct. Basal cisterns are not effaced. No acute intracranial hemorrhage. Vascular: No hyperdense vessel or unexpected calcification. Skull: Normal. Negative for fracture or focal lesion. Sinuses/Orbits: Mucosal thickening in the paranasal sinuses. No acute air-fluid levels. Mastoid air cells are clear. Other: None. CT CERVICAL SPINE FINDINGS Alignment: Normal. Skull base and vertebrae: No acute fracture. No primary bone lesion or focal pathologic process. Soft tissues and spinal canal:  No prevertebral fluid or swelling. No visible canal hematoma. Disc levels: Degenerative changes with disc space narrowing and endplate osteophyte formation most prominent at C5-6 and C6-7 levels. Upper chest: Focal area of ground-glass consolidation in the left upper lung, incompletely included within the field of view, possibly pneumonia. Right upper lung nodule measuring 6 mm. Not included within the field of view on the prior study. Other: None. IMPRESSION: 1. No acute intracranial abnormalities. Chronic atrophy and small vessel ischemic changes. 2. Normal alignment of the cervical spine. No acute displaced fractures identified. Degenerative changes. 3. Focal ground-glass consolidation suggested in the left upper lung, incompletely included. This could represent pneumonia in the appropriate clinical setting. 4. 6 mm right solid pulmonary nodule within the upper lobe. Per Fleischner Society Guidelines, recommend a non-contrast Chest CT at 6-12 months. If patient is high risk for malignancy, consider an additional non-contrast Chest CT at 18-24 months. If patient is low risk for malignancy, non-contrast Chest CT at 18-24 months is optional. These guidelines do not apply to immunocompromised patients and patients with cancer. Follow up in patients with significant comorbidities as clinically warranted. For lung cancer screening, adhere to Lung-RADS guidelines. Reference: Radiology. 2017; 284(1):228-43. Electronically Signed   By: Lucienne Capers M.D.   On: 01/16/2023 21:50   CT Cervical Spine Wo Contrast  Result Date: 01/16/2023 CLINICAL DATA:  Minor head trauma. Dizziness and fall. Neck trauma. Neck pain. EXAM: CT HEAD WITHOUT CONTRAST CT CERVICAL SPINE WITHOUT CONTRAST TECHNIQUE: Multidetector CT imaging of the head and cervical spine was performed following the standard protocol without intravenous contrast. Multiplanar CT image reconstructions of the cervical spine were also generated. RADIATION DOSE  REDUCTION: This exam was performed according to the departmental dose-optimization program which includes automated exposure control, adjustment of the mA and/or kV according to patient size and/or use of iterative reconstruction technique. COMPARISON:  06/03/2021 FINDINGS: CT HEAD FINDINGS Brain: Diffuse cerebral atrophy. Ventricular dilatation consistent with central atrophy. Low-attenuation changes in the deep white matter consistent with small vessel ischemia. No abnormal extra-axial fluid collections. No mass effect or midline shift. Gray-white matter junctions are distinct. Basal cisterns are not effaced. No acute intracranial hemorrhage. Vascular: No hyperdense vessel or unexpected calcification. Skull: Normal. Negative for fracture or focal lesion. Sinuses/Orbits: Mucosal thickening in the paranasal sinuses. No acute air-fluid levels. Mastoid air cells are clear. Other: None. CT CERVICAL SPINE FINDINGS Alignment: Normal. Skull base and vertebrae: No acute fracture. No primary bone lesion or focal pathologic process. Soft tissues and spinal canal: No prevertebral fluid or swelling. No visible canal hematoma. Disc levels: Degenerative changes with disc space narrowing and endplate osteophyte formation most prominent at C5-6 and C6-7 levels. Upper chest: Focal area of ground-glass consolidation in the left upper  lung, incompletely included within the field of view, possibly pneumonia. Right upper lung nodule measuring 6 mm. Not included within the field of view on the prior study. Other: None. IMPRESSION: 1. No acute intracranial abnormalities. Chronic atrophy and small vessel ischemic changes. 2. Normal alignment of the cervical spine. No acute displaced fractures identified. Degenerative changes. 3. Focal ground-glass consolidation suggested in the left upper lung, incompletely included. This could represent pneumonia in the appropriate clinical setting. 4. 6 mm right solid pulmonary nodule within the upper  lobe. Per Fleischner Society Guidelines, recommend a non-contrast Chest CT at 6-12 months. If patient is high risk for malignancy, consider an additional non-contrast Chest CT at 18-24 months. If patient is low risk for malignancy, non-contrast Chest CT at 18-24 months is optional. These guidelines do not apply to immunocompromised patients and patients with cancer. Follow up in patients with significant comorbidities as clinically warranted. For lung cancer screening, adhere to Lung-RADS guidelines. Reference: Radiology. 2017; 284(1):228-43. Electronically Signed   By: Lucienne Capers M.D.   On: 01/16/2023 21:50   DG Knee Complete 4 Views Right  Result Date: 01/16/2023 CLINICAL DATA:  Fall. EXAM: RIGHT KNEE - COMPLETE 4+ VIEW COMPARISON:  None Available. FINDINGS: No evidence of fracture, dislocation, or joint effusion. Joint spaces are maintained. There is mild tricompartmental osteophyte formation compatible with degenerative change. Soft tissues are unremarkable. IMPRESSION: 1. No fracture or dislocation. 2. Mild tricompartmental degenerative change. Electronically Signed   By: Ronney Asters M.D.   On: 01/16/2023 20:10    Assessment/Plan  Hypokalemia K 3.1 01/16/23 ED, repeat CMP/eGFR  Pulmonary nodule less than 6 mm determined by computed tomography of lung CT head/cervical spine, incidental finding: 6 mm right solid pulmonary nodule within the upper lobe. Per Fleischner Society Guidelines, recommend a non-contrast Chest CT at 6-12 months  Edema  trace, on Hyzaar, TED  OSA (obstructive sleep apnea) CPAP, suggested sleep apnea titration study or autopap  Atrial flutter ([place])  A-flutter 01/05/22, no recurrence, delay cardiology  Osteopenia after menopause OP declined Alendronate after initial consented.   Osteoarthritis, multiple sites Back pain mid to lower portion, better, X-ray T/L spine showed multilevel degenerative changes, no acute findings CT head/C+T+L spine, and pelvis,   taking Tylenol, Tramadol             Bone spur R foot, callus of foot, f/u podiatrist.   Allergic rhinitis  off Fluticasone nasal spray qd, on Claritin, Atrovent nasal spray.   Anxiety Lorazepam prn available to her.   Elevated TSH   Elevated TSH 5.07 06/11/22, TSH 5.11 02/10/23  HTN (hypertension) Blood pressure is controlled,  takes  Losartan/HCT, Metoprolol,  takes ASA, Atorvastatin.   GERD (gastroesophageal reflux disease) stable on Omeprazole, Hgb 11.5 01/16/23  Hyperlipidemia  takes Atorvastatin, LDL 70 06/11/22  Prediabetes diet controlled, Hgb a1c 5.7 06/11/22   Labs/tests ordered: CMP/eGFR next Tuesday, CT chest w/o CM one year  Next appt:  prn

## 2023-02-12 NOTE — Assessment & Plan Note (Signed)
OP declined Alendronate after initial consented.

## 2023-02-12 NOTE — Assessment & Plan Note (Signed)
Back pain mid to lower portion, better, X-ray T/L spine showed multilevel degenerative changes, no acute findings CT head/C+T+L spine, and pelvis,  taking Tylenol, Tramadol             Bone spur R foot, callus of foot, f/u podiatrist.

## 2023-02-12 NOTE — Assessment & Plan Note (Signed)
CT head/cervical spine, incidental finding: 6 mm right solid pulmonary nodule within the upper lobe. Per Fleischner Society Guidelines, recommend a non-contrast Chest CT at 6-12 months

## 2023-02-12 NOTE — Assessment & Plan Note (Signed)
K 3.1 01/16/23 ED, repeat CMP/eGFR

## 2023-02-12 NOTE — Assessment & Plan Note (Signed)
A-flutter 01/05/22, no recurrence, delay cardiology

## 2023-02-12 NOTE — Assessment & Plan Note (Signed)
off Fluticasone nasal spray qd, on Claritin, Atrovent nasal spray.

## 2023-02-12 NOTE — Assessment & Plan Note (Signed)
stable on Omeprazole, Hgb 11.5 01/16/23

## 2023-02-12 NOTE — Assessment & Plan Note (Signed)
Lorazepam prn available to her.

## 2023-02-12 NOTE — Assessment & Plan Note (Signed)
diet controlled, Hgb a1c 5.7 06/11/22

## 2023-02-12 NOTE — Assessment & Plan Note (Signed)
trace, on Hyzaar, TED

## 2023-02-12 NOTE — Assessment & Plan Note (Addendum)
CPAP, suggested sleep apnea titration study or autopap

## 2023-02-12 NOTE — Assessment & Plan Note (Signed)
Elevated TSH 5.07 06/11/22, TSH 5.11 02/10/23

## 2023-02-13 ENCOUNTER — Telehealth: Payer: Self-pay

## 2023-02-13 ENCOUNTER — Encounter: Payer: Self-pay | Admitting: Nurse Practitioner

## 2023-02-13 NOTE — Telephone Encounter (Signed)
Patient called and left voicemail stating that she forgot to ask about CPAP results. She wanted to also know if she could discontinue using CPAP machine every night. Message routed to PCP Mast, Man X, NP

## 2023-02-13 NOTE — Telephone Encounter (Signed)
Patient called and notified. Patient states that she doesn't know how to spell Dr's name and she was told to call her PCP. Patient placed on brief hold and hung up. I called patient back twice and no answer.

## 2023-02-17 ENCOUNTER — Telehealth: Payer: Self-pay | Admitting: *Deleted

## 2023-02-17 DIAGNOSIS — E876 Hypokalemia: Secondary | ICD-10-CM

## 2023-02-17 NOTE — Telephone Encounter (Signed)
Jean [name], Nurse with [facility] called and stated that patient did not have any orders for labs to be drawn but patient stated that she was to get them done.   Lab orders were placed in Epic under [facility] and placed incorrectly.   Placed the orders and printed the Requisition and faxed to [facility] as requested Fax:[phone]

## 2023-02-18 LAB — COMPREHENSIVE METABOLIC PANEL
AG Ratio: 2 (calc) (ref 1.0–2.5)
ALT: 11 U/L (ref 6–29)
AST: 20 U/L (ref 10–35)
Albumin: 3.9 g/dL (ref 3.6–5.1)
Alkaline phosphatase (APISO): 71 U/L (ref 37–153)
BUN/Creatinine Ratio: 29 (calc) — ABNORMAL HIGH (ref 6–22)
BUN: 31 mg/dL — ABNORMAL HIGH (ref 7–25)
CO2: 25 mmol/L (ref 20–32)
Calcium: 9.9 mg/dL (ref 8.6–10.4)
Chloride: 107 mmol/L (ref 98–110)
Creat: 1.06 mg/dL — ABNORMAL HIGH (ref 0.60–0.95)
Globulin: 2 g/dL (calc) (ref 1.9–3.7)
Glucose, Bld: 117 mg/dL — ABNORMAL HIGH (ref 65–99)
Potassium: 4.9 mmol/L (ref 3.5–5.3)
Sodium: 142 mmol/L (ref 135–146)
Total Bilirubin: 0.5 mg/dL (ref 0.2–1.2)
Total Protein: 5.9 g/dL — ABNORMAL LOW (ref 6.1–8.1)

## 2023-03-05 ENCOUNTER — Non-Acute Institutional Stay: Payer: PPO | Admitting: Nurse Practitioner

## 2023-03-05 ENCOUNTER — Encounter: Payer: Self-pay | Admitting: Nurse Practitioner

## 2023-03-05 VITALS — BP 129/78 | HR 60 | Temp 97.4°F | Resp 18 | Ht 63.0 in | Wt 169.0 lb

## 2023-03-05 DIAGNOSIS — G4733 Obstructive sleep apnea (adult) (pediatric): Secondary | ICD-10-CM | POA: Diagnosis not present

## 2023-03-05 DIAGNOSIS — R7989 Other specified abnormal findings of blood chemistry: Secondary | ICD-10-CM | POA: Diagnosis not present

## 2023-03-05 DIAGNOSIS — I10 Essential (primary) hypertension: Secondary | ICD-10-CM | POA: Diagnosis not present

## 2023-03-05 DIAGNOSIS — F419 Anxiety disorder, unspecified: Secondary | ICD-10-CM | POA: Diagnosis not present

## 2023-03-05 DIAGNOSIS — F5105 Insomnia due to other mental disorder: Secondary | ICD-10-CM | POA: Diagnosis not present

## 2023-03-05 NOTE — Assessment & Plan Note (Addendum)
Blood pressure is controlled,  on Hyzaar, TED

## 2023-03-05 NOTE — Assessment & Plan Note (Signed)
Elevated TSH 5.07 06/11/22, TSH 5.11 02/10/23

## 2023-03-05 NOTE — Assessment & Plan Note (Addendum)
OSA, recent sleep study done, the patient brought her CPAP to the clinic [facility] for setting adjustment, referred to sleep study center for follow up.

## 2023-03-05 NOTE — Assessment & Plan Note (Signed)
Lorazepam prn available to her.

## 2023-03-05 NOTE — Progress Notes (Signed)
Location:   [place] Room Number: [id number] Place of Service:  Clinic (12) Provider: Marlana Latus NP  Code Status: DNR Goals of Care:     01/07/2023    9:18 PM  Advanced Directives  Does Patient Have a Medical Advance Directive? Yes  Type of Advance Directive Living will;Healthcare Power of [place];Out of facility DNR (pink MOST or yellow form)  Does patient want to make changes to medical advance directive? No - Patient declined     Chief Complaint  Patient presents with   Medical Management of Chronic Issues    sleep study results with c pap order    HPI: Patient is a 89 y.o. female seen today for medical management of chronic diseases.     ED eval 01/16/23 near syncope, fall, EKG, CT head/cervical spine, X-ray R knee, CBC, CMP unremarkable.             CT head/cervical spine, incidental finding: 6 mm right solid pulmonary nodule within the upper lobe. Per Fleischner Society Guidelines, recommend a non-contrast Chest CT at 6-12 months-ordered.              Edema BLE, trace, on Hyzaar, TED             OSA, CPAP             A-flutter 01/05/22, no recurrence, delay cardiology OP declined Alendronate after initial consented.  Back pain mid to lower portion, better, X-ray T/L spine showed multilevel degenerative changes, no acute findings CT head/C+T+L spine, and pelvis,  taking Tylenol, Tramadol             Bone spur R foot, callus of foot, f/u podiatrist.              Allergic rhinitis, off Fluticasone nasal spray qd, on Claritin, Atrovent nasal spray.              Anxiety, Lorazepam prn available to her.              Elevated TSH 5.07 06/11/22, TSH 5.11 02/10/23             HTN, takes  Losartan/HCT, Metoprolol,  takes ASA, Atorvastatin.              GERD, stable on Omeprazole, Hgb 11.5 01/16/23             Hyperlipidemia, takes Atorvastatin, LDL 70 06/11/22             Prediabetes, diet controlled, Hgb a1c 5.7 06/11/22    Past Medical History:  Diagnosis Date    GERD (gastroesophageal reflux disease)    Hx of cataract surgery    both eyes   Hyperglycemia    Hyperlipidemia    Hypertension    Lumbago 04/05/2016   Macular degeneration    ? which eye(s)   Obstructive sleep apnea    Osteoporosis, senile    Overactive bladder    Squamous cell skin cancer 2/16   left lower leg    Past Surgical History:  Procedure Laterality Date   ABDOMINAL HYSTERECTOMY  1960's   APPENDECTOMY  1960's   MOLE REMOVAL  2000   facial   TONSILLECTOMY      Allergies  Allergen Reactions   Adhesive [Tape]    Ampicillin    Elemental Sulfur    Nitrofuran Derivatives    Zoloft [Sertraline Hcl] Other (See Comments)    Dizziness, nausea, faint feeling, and nightmares      Allergies  as of 03/05/2023       Reactions   Adhesive [tape]    Ampicillin    Elemental Sulfur    Nitrofuran Derivatives    Zoloft [sertraline Hcl] Other (See Comments)   Dizziness, nausea, faint feeling, and nightmares          Medication List        Accurate as of March 05, 2023 11:59 PM. If you have any questions, ask your nurse or doctor.          acetaminophen 500 MG tablet Commonly known as: TYLENOL Take 500 mg by mouth daily.   aspirin EC 81 MG tablet Take 1 tablet (81 mg total) by mouth daily.   atorvastatin 20 MG tablet Commonly known as: LIPITOR Take 1 tablet (20 mg total) by mouth daily.   Calcium Carb-Cholecalciferol 600-800 MG-UNIT Tabs Take 1 tablet by mouth 2 (two) times daily.   loratadine 10 MG tablet Commonly known as: CLARITIN Take 10 mg by mouth daily.   LORazepam 0.5 MG tablet Commonly known as: ATIVAN TAKE ONE TABLET ONCE DAILY AS NEEDED FOR ANXIETY.   losartan-hydrochlorothiazide 100-12.5 MG tablet Commonly known as: HYZAAR Take 1 tablet by mouth daily.   metoprolol succinate 25 MG 24 hr tablet Commonly known as: TOPROL-XL TAKE 1/2 TABLET ONCE A DAY.   Ocuvite Adult 50+ Caps Take 1 capsule by mouth every evening.   omeprazole 40 MG  capsule Commonly known as: PRILOSEC TAKE 1 CAPSULE DAILY 1/2 HOUR BEFORE BREAKFAST FOR STOMACH ACID.   Systane 0.4-0.3 % Gel ophthalmic gel Generic drug: Polyethyl Glycol-Propyl Glycol Place 1 application into both eyes daily.   traMADol 50 MG tablet Commonly known as: ULTRAM Take 1 tablet (50 mg total) by mouth every 6 (six) hours as needed.   TRIAMCINOLONE ACETONIDE(NASAL) NA Place 2 sprays into the nose. Each nostril        Review of Systems:  Review of Systems  Constitutional:  Negative for appetite change, fatigue and fever.  HENT:  Negative for congestion, hearing loss, trouble swallowing and voice change.        Improved.   Eyes:  Negative for visual disturbance.  Respiratory:  Negative for cough and shortness of breath.   Cardiovascular:  Negative for leg swelling.  Gastrointestinal:  Negative for abdominal pain and constipation.       X1-2 times in the past 3 months, diet adjustment helped.   Genitourinary:  Negative for dysuria and urgency.       0-1x/night urination, occasionally urinary leakage.   Musculoskeletal:  Positive for arthralgias, back pain and gait problem.  Skin:  Negative for color change.  Neurological:  Negative for dizziness, speech difficulty, weakness and light-headedness.       Tingling in toes sometimes, better after moves around.   Psychiatric/Behavioral:  Negative for confusion and sleep disturbance. The patient is not nervous/anxious.     Health Maintenance  Topic Date Due   DTaP/Tdap/Td (2 - Tdap) 12/14/2022   COVID-19 Vaccine (5 - 2023-24 season) 03/30/2023 (Originally 12/25/2022)   Zoster Vaccines- Shingrix (1 of 2) 12/29/2094 (Originally 01/23/1943)   Medicare Annual Wellness (AWV)  12/05/2023   Pneumonia Vaccine 74+ Years old  Completed   INFLUENZA VACCINE  Completed   DEXA SCAN  Completed   HPV VACCINES  Aged Out    Physical Exam: Vitals:   03/05/23 1509  BP: 129/78  Pulse: 60  Resp: 18  Temp: (!) 97.4 F (36.3 C)   SpO2: 100%  Weight: 169  lb (76.7 kg)  Height: '5\' 3"'$  (1.6 m)   Body mass index is 29.94 kg/m. Physical Exam Vitals and nursing note reviewed.  Constitutional:      Appearance: Normal appearance.  HENT:     Head: Normocephalic and atraumatic.     Mouth/Throat:     Mouth: Mucous membranes are moist.  Eyes:     Extraocular Movements: Extraocular movements intact.     Conjunctiva/sclera: Conjunctivae normal.     Pupils: Pupils are equal, round, and reactive to light.  Cardiovascular:     Rate and Rhythm: Normal rate and regular rhythm.     Heart sounds: No murmur heard. Pulmonary:     Effort: Pulmonary effort is normal.     Breath sounds: Normal breath sounds. No rales.  Abdominal:     General: Bowel sounds are normal.     Palpations: Abdomen is soft.     Tenderness: There is no abdominal tenderness.  Musculoskeletal:     Cervical back: Normal range of motion and neck supple.     Right lower leg: No edema.     Left lower leg: No edema.  Skin:    General: Skin is warm and dry.  Neurological:     General: No focal deficit present.     Mental Status: She is alert and oriented to person, place, and time. Mental status is at baseline.     Gait: Gait abnormal.  Psychiatric:        Mood and Affect: Mood normal.        Behavior: Behavior normal.        Thought Content: Thought content normal.        Judgment: Judgment normal.     Labs reviewed: Basic Metabolic Panel: Recent Labs    06/11/22 1135 01/16/23 1941 02/10/23 0713 02/17/23 1105  NA 143 139  --  142  K 3.5 3.1*  --  4.9  CL 106 105  --  107  CO2 29 24  --  25  GLUCOSE 85 105*  --  117*  BUN 35* 29*  --  31*  CREATININE 0.97* 0.88  --  1.06*  CALCIUM 9.8 9.5  --  9.9  TSH 5.07*  --  5.11*  --    Liver Function Tests: Recent Labs    06/11/22 1135 01/16/23 1941 02/17/23 1105  AST '19 26 20  '$ ALT '13 17 11  '$ ALKPHOS  --  60  --   BILITOT 0.5 0.2* 0.5  PROT 6.3 6.1* 5.9*  ALBUMIN  --  3.7  --    No  results for input(s): "LIPASE", "AMYLASE" in the last 8760 hours. No results for input(s): "AMMONIA" in the last 8760 hours. CBC: Recent Labs    06/11/22 1135 01/16/23 1941  WBC 7.0 6.4  NEUTROABS 3,696 3.8  HGB 11.5* 11.5*  HCT 36.3 35.9*  MCV 95.5 95.7  PLT 206 179   Lipid Panel: Recent Labs    06/11/22 1135  CHOL 139  HDL 56  LDLCALC 70  TRIG 57  CHOLHDL 2.5   Lab Results  Component Value Date   HGBA1C 5.7 (H) 06/11/2022    Procedures since last visit: No results found.  Assessment/Plan  OSA (obstructive sleep apnea) OSA, recent sleep study done, the patient brought her CPAP to the clinic [facility] for setting adjustment, referred to sleep study center for follow up.   HTN (hypertension) Blood pressure is controlled,  on Hyzaar, TED  Insomnia secondary to anxiety Lorazepam prn  available to her.   Elevated TSH Elevated TSH 5.07 06/11/22, TSH 5.11 02/10/23   Labs/tests ordered:  none   Next appt:  prn

## 2023-03-12 ENCOUNTER — Non-Acute Institutional Stay: Payer: PPO | Admitting: Nurse Practitioner

## 2023-03-12 ENCOUNTER — Encounter: Payer: Self-pay | Admitting: Nurse Practitioner

## 2023-03-12 VITALS — Temp 98.0°F | Resp 17 | Ht 63.0 in | Wt 169.0 lb

## 2023-03-12 DIAGNOSIS — I4892 Unspecified atrial flutter: Secondary | ICD-10-CM | POA: Diagnosis not present

## 2023-03-12 DIAGNOSIS — I10 Essential (primary) hypertension: Secondary | ICD-10-CM | POA: Diagnosis not present

## 2023-03-12 DIAGNOSIS — J309 Allergic rhinitis, unspecified: Secondary | ICD-10-CM | POA: Diagnosis not present

## 2023-03-12 DIAGNOSIS — E782 Mixed hyperlipidemia: Secondary | ICD-10-CM | POA: Diagnosis not present

## 2023-03-12 DIAGNOSIS — G4733 Obstructive sleep apnea (adult) (pediatric): Secondary | ICD-10-CM

## 2023-03-12 DIAGNOSIS — M150 Primary generalized (osteo)arthritis: Secondary | ICD-10-CM

## 2023-03-12 DIAGNOSIS — R609 Edema, unspecified: Secondary | ICD-10-CM | POA: Diagnosis not present

## 2023-03-12 DIAGNOSIS — F419 Anxiety disorder, unspecified: Secondary | ICD-10-CM

## 2023-03-12 DIAGNOSIS — M159 Polyosteoarthritis, unspecified: Secondary | ICD-10-CM | POA: Diagnosis not present

## 2023-03-12 DIAGNOSIS — K219 Gastro-esophageal reflux disease without esophagitis: Secondary | ICD-10-CM

## 2023-03-12 DIAGNOSIS — R42 Dizziness and giddiness: Secondary | ICD-10-CM | POA: Diagnosis not present

## 2023-03-12 MED ORDER — LOSARTAN POTASSIUM 100 MG PO TABS: 100.0000 mg | ORAL_TABLET | Freq: Every day | ORAL | 2 refills | Status: DC

## 2023-03-12 NOTE — Assessment & Plan Note (Signed)
Back pain mid to lower portion, better, X-ray T/L spine showed multilevel degenerative changes, no acute findings CT head/C+T+L spine, and pelvis,  taking Tylenol, Tramadol

## 2023-03-12 NOTE — Assessment & Plan Note (Signed)
Lorazepam prn available to her-use with caution in setting of c/o light headed at times

## 2023-03-12 NOTE — Assessment & Plan Note (Signed)
stable on Omeprazole, Hgb 11.5 01/16/23 

## 2023-03-12 NOTE — Assessment & Plan Note (Signed)
takes  Losartan, Metoprolol,  takes ASA, loose blood pressure control in setting of c/o light headed at times.

## 2023-03-12 NOTE — Assessment & Plan Note (Signed)
A-flutter 01/05/22, no recurrence, delay cardiology 

## 2023-03-12 NOTE — Progress Notes (Signed)
Location:   Clinic [facility]   Place of Service:  Clinic (12) Provider: Marlana Latus NP  Code Status: DNR Goals of Care:     03/12/2023    3:33 PM  Advanced Directives  Does Patient Have a Medical Advance Directive? Yes  Type of Advance Directive Living will;Healthcare Power of Attorney     Chief Complaint  Patient presents with   Acute Visit    Patient states she has been light headed     HPI: Patient is a 89 y.o. female seen today for c/o light headed at times, mostly positional.    ED eval 01/16/23 near syncope, fall, EKG, CT head/cervical spine, X-ray R knee, CBC, CMP unremarkable.             CT head/cervical spine, incidental finding: 6 mm right solid pulmonary nodule within the upper lobe. Per Fleischner Society Guidelines, recommend a non-contrast Chest CT at 6-12 months-ordered.              Edema BLE, not apparent, TED, Bun/creat 31/1.06 02/17/23             OSA, CPAP             A-flutter 01/05/22, no recurrence, delay cardiology OP declined Alendronate after initial consented.  Back pain mid to lower portion, better, X-ray T/L spine showed multilevel degenerative changes, no acute findings CT head/C+T+L spine, and pelvis,  taking Tylenol, Tramadol             Bone spur R foot, callus of foot, f/u podiatrist.              Allergic rhinitis, off Fluticasone nasal spray, Claritin, Atrovent nasal spray.              Anxiety, Lorazepam prn available to her.              Elevated TSH 5.07 06/11/22, TSH 5.11 02/10/23             HTN, takes  Losartan, Metoprolol,  takes ASA             GERD, stable on Omeprazole, Hgb 11.5 01/16/23             Hyperlipidemia, off Atorvastatin, LDL 70 06/11/22             Prediabetes, diet controlled, Hgb a1c 5.7 06/11/22  Past Medical History:  Diagnosis Date   GERD (gastroesophageal reflux disease)    Hx of cataract surgery    both eyes   Hyperglycemia    Hyperlipidemia    Hypertension    Lumbago 04/05/2016   Macular degeneration    ? which  eye(s)   Obstructive sleep apnea    Osteoporosis, senile    Overactive bladder    Squamous cell skin cancer 2/16   left lower leg    Past Surgical History:  Procedure Laterality Date   ABDOMINAL HYSTERECTOMY  1960's   APPENDECTOMY  1960's   MOLE REMOVAL  2000   facial   TONSILLECTOMY      Allergies  Allergen Reactions   Adhesive [Tape]    Ampicillin    Elemental Sulfur    Nitrofuran Derivatives    Zoloft [Sertraline Hcl] Other (See Comments)    Dizziness, nausea, faint feeling, and nightmares      Allergies as of 03/12/2023       Reactions   Adhesive [tape]    Ampicillin    Elemental Sulfur    Nitrofuran Derivatives  Zoloft [sertraline Hcl] Other (See Comments)   Dizziness, nausea, faint feeling, and nightmares          Medication List        Accurate as of March 12, 2023  3:53 PM. If you have any questions, ask your nurse or doctor.          acetaminophen 500 MG tablet Commonly known as: TYLENOL Take 500 mg by mouth daily.   aspirin EC 81 MG tablet Take 1 tablet (81 mg total) by mouth daily.   atorvastatin 20 MG tablet Commonly known as: LIPITOR Take 1 tablet (20 mg total) by mouth daily.   Calcium Carb-Cholecalciferol 600-800 MG-UNIT Tabs Take 1 tablet by mouth 2 (two) times daily.   loratadine 10 MG tablet Commonly known as: CLARITIN Take 10 mg by mouth daily.   LORazepam 0.5 MG tablet Commonly known as: ATIVAN TAKE ONE TABLET ONCE DAILY AS NEEDED FOR ANXIETY.   losartan-hydrochlorothiazide 100-12.5 MG tablet Commonly known as: HYZAAR Take 1 tablet by mouth daily.   metoprolol succinate 25 MG 24 hr tablet Commonly known as: TOPROL-XL TAKE 1/2 TABLET ONCE A DAY.   Ocuvite Adult 50+ Caps Take 1 capsule by mouth every evening.   omeprazole 40 MG capsule Commonly known as: PRILOSEC TAKE 1 CAPSULE DAILY 1/2 HOUR BEFORE BREAKFAST FOR STOMACH ACID.   Systane 0.4-0.3 % Gel ophthalmic gel Generic drug: Polyethyl Glycol-Propyl  Glycol Place 1 application into both eyes daily.   traMADol 50 MG tablet Commonly known as: ULTRAM Take 1 tablet (50 mg total) by mouth every 6 (six) hours as needed.   TRIAMCINOLONE ACETONIDE(NASAL) NA Place 2 sprays into the nose. Each nostril        Review of Systems:  Review of Systems  Constitutional:  Negative for appetite change, fatigue and fever.  HENT:  Negative for congestion, hearing loss, trouble swallowing and voice change.        Improved.   Eyes:  Negative for visual disturbance.  Respiratory:  Negative for cough and shortness of breath.   Cardiovascular:  Negative for leg swelling.  Gastrointestinal:  Negative for abdominal pain and constipation.       X1-2 times in the past 3 months, diet adjustment helped.   Genitourinary:  Negative for dysuria and urgency.       0-1x/night urination, occasionally urinary leakage.   Musculoskeletal:  Positive for arthralgias, back pain and gait problem.  Skin:  Negative for color change.  Neurological:  Positive for light-headedness. Negative for dizziness, speech difficulty and weakness.       Tingling in toes sometimes, better after moves around.   Psychiatric/Behavioral:  Negative for confusion and sleep disturbance. The patient is not nervous/anxious.     Health Maintenance  Topic Date Due   DTaP/Tdap/Td (2 - Tdap) 12/14/2022   COVID-19 Vaccine (5 - 2023-24 season) 03/30/2023 (Originally 12/25/2022)   Zoster Vaccines- Shingrix (1 of 2) 12/29/2094 (Originally 01/23/1943)   Medicare Annual Wellness (AWV)  12/05/2023   Pneumonia Vaccine 51+ Years old  Completed   INFLUENZA VACCINE  Completed   DEXA SCAN  Completed   HPV VACCINES  Aged Out    Physical Exam: Vitals:   03/12/23 1529 03/12/23 1530 03/12/23 1531  Resp: 17    Temp: 98 F (36.7 C)    TempSrc: Temporal    SpO2: 97% 99% 94%  Weight: 169 lb (76.7 kg)    Height: '5\' 3"'$  (1.6 m)     Body mass index is 29.94 kg/m.  Physical Exam Vitals and nursing note  reviewed.  Constitutional:      Appearance: Normal appearance.  HENT:     Head: Normocephalic and atraumatic.     Mouth/Throat:     Mouth: Mucous membranes are moist.  Eyes:     Extraocular Movements: Extraocular movements intact.     Conjunctiva/sclera: Conjunctivae normal.     Pupils: Pupils are equal, round, and reactive to light.  Cardiovascular:     Rate and Rhythm: Normal rate and regular rhythm.     Heart sounds: No murmur heard. Pulmonary:     Effort: Pulmonary effort is normal.     Breath sounds: Normal breath sounds. No rales.  Abdominal:     General: Bowel sounds are normal.     Palpations: Abdomen is soft.     Tenderness: There is no abdominal tenderness.  Musculoskeletal:     Cervical back: Normal range of motion and neck supple.     Right lower leg: No edema.     Left lower leg: No edema.  Skin:    General: Skin is warm and dry.  Neurological:     General: No focal deficit present.     Mental Status: She is alert and oriented to person, place, and time. Mental status is at baseline.     Gait: Gait abnormal.  Psychiatric:        Mood and Affect: Mood normal.        Behavior: Behavior normal.        Thought Content: Thought content normal.        Judgment: Judgment normal.    Labs reviewed: Basic Metabolic Panel: Recent Labs    06/11/22 1135 01/16/23 1941 02/10/23 0713 02/17/23 1105  NA 143 139  --  142  K 3.5 3.1*  --  4.9  CL 106 105  --  107  CO2 29 24  --  25  GLUCOSE 85 105*  --  117*  BUN 35* 29*  --  31*  CREATININE 0.97* 0.88  --  1.06*  CALCIUM 9.8 9.5  --  9.9  TSH 5.07*  --  5.11*  --    Liver Function Tests: Recent Labs    06/11/22 1135 01/16/23 1941 02/17/23 1105  AST '19 26 20  '$ ALT '13 17 11  '$ ALKPHOS  --  60  --   BILITOT 0.5 0.2* 0.5  PROT 6.3 6.1* 5.9*  ALBUMIN  --  3.7  --    No results for input(s): "LIPASE", "AMYLASE" in the last 8760 hours. No results for input(s): "AMMONIA" in the last 8760 hours. CBC: Recent Labs     06/11/22 1135 01/16/23 1941  WBC 7.0 6.4  NEUTROABS 3,696 3.8  HGB 11.5* 11.5*  HCT 36.3 35.9*  MCV 95.5 95.7  PLT 206 179   Lipid Panel: Recent Labs    06/11/22 1135  CHOL 139  HDL 56  LDLCALC 70  TRIG 57  CHOLHDL 2.5   Lab Results  Component Value Date   HGBA1C 5.7 (H) 06/11/2022    Procedures since last visit: No results found.  Assessment/Plan  Dizziness Still c/o light headed at times, positional. Obtain ortho Bps-no significant Bp change, simplify meds: dc Atorvastatin, Loratadine, HCTZ  EKG: SR vent rate 64, no significant ST-T changes    ED eval 01/16/23 near syncope, fall, EKG, CT head/cervical spine, X-ray R knee, CBC, CMP unremarkable.               CT head/cervical  spine, incidental finding: 6 mm right solid pulmonary nodule within the upper lobe. Per Fleischner Society Guidelines, recommend a non-contrast Chest CT at 6-12 months-ordered.   Edema  not apparent, TED, Bun/creat 31/1.06 02/17/23, off HCTZ in setting of c/o light headed at times.   OSA (obstructive sleep apnea) CPAP  Atrial flutter ([place])   A-flutter 01/05/22, no recurrence, delay cardiology  Osteoarthritis, multiple sites Back pain mid to lower portion, better, X-ray T/L spine showed multilevel degenerative changes, no acute findings CT head/C+T+L spine, and pelvis,  taking Tylenol, Tramadol  Allergic rhinitis  off Fluticasone nasal spray, Claritin, Atrovent nasal spray, ? efficacy  Anxiety Lorazepam prn available to her-use with caution in setting of c/o light headed at times  HTN (hypertension) takes  Losartan, Metoprolol,  takes ASA, loose blood pressure control in setting of c/o light headed at times.   GERD (gastroesophageal reflux disease) stable on Omeprazole, Hgb 11.5 01/16/23  Hyperlipidemia  off Atorvastatin, LDL 70 06/11/22   Labs/tests ordered:  none  Next appt:  2 months.

## 2023-03-12 NOTE — Assessment & Plan Note (Signed)
not apparent, TED, Bun/creat 31/1.06 02/17/23, off HCTZ in setting of c/o light headed at times.

## 2023-03-12 NOTE — Assessment & Plan Note (Signed)
CPAP.  

## 2023-03-12 NOTE — Assessment & Plan Note (Signed)
off Fluticasone nasal spray, Claritin, Atrovent nasal spray, ? efficacy

## 2023-03-12 NOTE — Assessment & Plan Note (Signed)
off Atorvastatin, LDL 70 06/11/22

## 2023-03-12 NOTE — Assessment & Plan Note (Addendum)
Still c/o light headed at times, positional. Obtain ortho Bps-no significant Bp change, simplify meds: dc Atorvastatin, Loratadine, HCTZ  EKG: SR vent rate 64, no significant ST-T changes    ED eval 01/16/23 near syncope, fall, EKG, CT head/cervical spine, X-ray R knee, CBC, CMP unremarkable.               CT head/cervical spine, incidental finding: 6 mm right solid pulmonary nodule within the upper lobe. Per Fleischner Society Guidelines, recommend a non-contrast Chest CT at 6-12 months-ordered.

## 2023-03-13 ENCOUNTER — Telehealth: Payer: Self-pay

## 2023-03-13 NOTE — Telephone Encounter (Signed)
Patient has been using CPAP for about 5 years. Sleep enhancement program. Patient had sleep study done didn't receive the information. Sleep study report was sent and is under media tab. I have routed the report to [facility] Mast, NP. She also wants to discuss the nodule that was found.  Message routed to [facility] Mast, NP

## 2023-03-19 ENCOUNTER — Telehealth: Payer: Self-pay

## 2023-03-19 DIAGNOSIS — G4733 Obstructive sleep apnea (adult) (pediatric): Secondary | ICD-10-CM

## 2023-03-19 NOTE — Telephone Encounter (Signed)
Helene Kelp (independent nurse at [facility])  stopped by the clinic to advise [facility] Mast, NP that she needs an order to fax to [employer] in order for patient to get her CPAP settings set. ManXie asked that I contact [employer] to see exactly what they need.   I called [employer], spoke with [name] (female) and was told that a RX and order would need to come from whomever performed patients sleep study.   Dr.Young interpreted patients sleep study performed on 01/07/2023. I will forward this message to him at this time for further follow through.

## 2023-03-19 NOTE — Telephone Encounter (Signed)
Manixe please advise. It appears that an order for patient to be evaluated at [facility] needs to be placed for patient to be seen for management of sleep apnea and for them to provide orders for CPAP settings.   Order is pending for review and signature if in agreement

## 2023-03-19 NOTE — Telephone Encounter (Signed)
I'm sorry, but the DME company was incorrect. I am not the treating physician, as I have never examined this patient. The results of the sleep study go back to the provider who ordered the study. If that person is not trained to evaluate and prescribe for sleep apnea, then one of our sleep medicine providers at [facility] would be happy to see the patient in consultation for management.

## 2023-03-30 NOTE — Progress Notes (Unsigned)
04/02/23- 89 yoF never smoker for sleep courtesy of Man Mast, NP with concern of OSA, bringing CPAP. Medical problem list includes OSA, Insomnia,  HTN, AFlutter, Lung nodule<86mm, GERD, HOH, Obesity, Anxiety, Allergic Rhinitiss,  NPSG 01/07/23-  AHI 50.7/ hr, desaturation to 80%, body weight 171 lbs Epworth score- Body weight today-    CXR 01/05/22 IMPRESSION: 1. Small bilateral pleural effusions with bibasilar atelectasis. 2. No rib fracture.

## 2023-04-02 ENCOUNTER — Ambulatory Visit (INDEPENDENT_AMBULATORY_CARE_PROVIDER_SITE_OTHER): Payer: PPO | Admitting: Internal Medicine

## 2023-04-02 ENCOUNTER — Encounter: Payer: Self-pay | Admitting: Internal Medicine

## 2023-04-02 ENCOUNTER — Ambulatory Visit (INDEPENDENT_AMBULATORY_CARE_PROVIDER_SITE_OTHER): Payer: PPO

## 2023-04-02 VITALS — BP 146/88 | HR 82 | Ht 63.0 in | Wt 169.0 lb

## 2023-04-02 DIAGNOSIS — R911 Solitary pulmonary nodule: Secondary | ICD-10-CM

## 2023-04-02 DIAGNOSIS — G4733 Obstructive sleep apnea (adult) (pediatric): Secondary | ICD-10-CM

## 2023-04-02 DIAGNOSIS — K449 Diaphragmatic hernia without obstruction or gangrene: Secondary | ICD-10-CM | POA: Diagnosis not present

## 2023-04-02 NOTE — Patient Instructions (Addendum)
Order- DME [facility]- please replace old CPAP machine-motor life exceeded". Change to auto 5-15, humidifier, supplies, AirView/ card. Please refit mask of choice for better fit and seal. She is used to nasal mask.  Order- CXR   dx lung nodule

## 2023-04-02 NOTE — Assessment & Plan Note (Signed)
She asks about this while she is here.  Never smoked. Plan-CXR

## 2023-04-02 NOTE — Assessment & Plan Note (Signed)
Benefits from CPAP and is compliant goals.  Control is not optimal and there is more leak than we like.  Machine has reached end of motor life and due for replacement. Plan-[facility] will replace machine, changing to auto 5-15 and will refit mask for better fit and seal.

## 2023-04-08 ENCOUNTER — Other Ambulatory Visit: Payer: Self-pay

## 2023-04-08 DIAGNOSIS — R911 Solitary pulmonary nodule: Secondary | ICD-10-CM

## 2023-04-23 ENCOUNTER — Non-Acute Institutional Stay: Payer: PPO | Admitting: Nurse Practitioner

## 2023-04-23 ENCOUNTER — Encounter: Payer: Self-pay | Admitting: Nurse Practitioner

## 2023-04-23 VITALS — BP 136/84 | HR 84 | Temp 97.7°F | Ht 63.0 in | Wt 164.6 lb

## 2023-04-23 DIAGNOSIS — K219 Gastro-esophageal reflux disease without esophagitis: Secondary | ICD-10-CM

## 2023-04-23 DIAGNOSIS — G4733 Obstructive sleep apnea (adult) (pediatric): Secondary | ICD-10-CM

## 2023-04-23 DIAGNOSIS — F419 Anxiety disorder, unspecified: Secondary | ICD-10-CM

## 2023-04-23 DIAGNOSIS — I10 Essential (primary) hypertension: Secondary | ICD-10-CM | POA: Diagnosis not present

## 2023-04-23 NOTE — Assessment & Plan Note (Signed)
Blood pressure is controlled,  takes  Losartan, Metoprolol,  takes ASA, Bun/creat 31/1.06 02/17/23

## 2023-04-23 NOTE — Assessment & Plan Note (Signed)
CPAP, seeing pulmonology.

## 2023-04-23 NOTE — Assessment & Plan Note (Signed)
No change, Lorazepam prn available to her.

## 2023-04-23 NOTE — Progress Notes (Signed)
Location:   Clinic [facility]   Place of Service:  Clinic (12) Provider: Chipper Oman NP  Code Status: DNR Goals of Care:     03/12/2023    3:33 PM  Advanced Directives  Does Patient Have a Medical Advance Directive? Yes  Type of Advance Directive Living will;Healthcare Power of Attorney     Chief Complaint  Patient presents with   Medical Management of Chronic Issues    Patient presents today for a 6 weeks follow-up   Quality Metric Gaps    TDAP,COVID#5    HPI: Patient is a 89 y.o. female seen today for medical management of chronic diseases.      ED eval 01/16/23 near syncope, fall, EKG, CT head/cervical spine, X-ray R knee, CBC, CMP unremarkable.             CT head/cervical spine, incidental finding: 6 mm right solid pulmonary nodule within the upper lobe. Per Fleischner Society Guidelines, recommend a non-contrast Chest CT at 6-12 months-ordered. pending CT chest wo CM. CXR 04/02/23 no acute process.              Edema BLE, not apparent, TED, Bun/creat 31/1.06 02/17/23             OSA, CPAP, seeing pulmonology.              A-flutter 01/05/22, no recurrence, delay cardiology OP declined Alendronate after initial consented.  Back pain mid to lower portion, better, X-ray T/L spine showed multilevel degenerative changes, no acute findings CT head/C+T+L spine, and pelvis,  taking Tylenol, Tramadol             Bone spur R foot, callus of foot, f/u podiatrist.              Allergic rhinitis, off Fluticasone nasal spray, Claritin, Atrovent nasal spray.              Anxiety, Lorazepam prn available to her.              Elevated TSH 5.07 06/11/22, TSH 5.11 02/10/23             HTN, dc'd Losartan by Dr. Maple Hudson 04/02/23? The patient is still taking Losartan  qd,  continue Metoprolol,  takes ASA, Bun/creat 31/1.06 02/17/23             GERD, stable on Omeprazole, Hgb 11.5 01/16/23             Hyperlipidemia, off Atorvastatin, LDL 70 06/11/22             Prediabetes, diet controlled, Hgb a1c 5.7  06/11/22 Past Medical History:  Diagnosis Date   GERD (gastroesophageal reflux disease)    Hx of cataract surgery    both eyes   Hyperglycemia    Hyperlipidemia    Hypertension    Lumbago 04/05/2016   Macular degeneration    ? which eye(s)   Obstructive sleep apnea    Osteoporosis, senile    Overactive bladder    Squamous cell skin cancer 2/16   left lower leg    Past Surgical History:  Procedure Laterality Date   ABDOMINAL HYSTERECTOMY  1960's   APPENDECTOMY  1960's   MOLE REMOVAL  2000   facial   TONSILLECTOMY      Allergies  Allergen Reactions   Adhesive [Tape]    Ampicillin    Elemental Sulfur    Nitrofuran Derivatives    Zoloft [Sertraline Hcl] Other (See Comments)    Dizziness, nausea, faint  feeling, and nightmares      Allergies as of 04/23/2023       Reactions   Adhesive [tape]    Ampicillin    Elemental Sulfur    Nitrofuran Derivatives    Zoloft [sertraline Hcl] Other (See Comments)   Dizziness, nausea, faint feeling, and nightmares          Medication List        Accurate as of April 23, 2023 11:59 PM. If you have any questions, ask your nurse or doctor.          STOP taking these medications    losartan-hydrochlorothiazide 100-12.5 MG tablet Commonly known as: HYZAAR Stopped by: Jackson Fetters X Wallice Granville, NP       TAKE these medications    acetaminophen 500 MG tablet Commonly known as: TYLENOL Take 500 mg by mouth daily.   aspirin EC 81 MG tablet Take 1 tablet (81 mg total) by mouth daily.   Calcium Carb-Cholecalciferol 600-800 MG-UNIT Tabs Take 1 tablet by mouth 2 (two) times daily.   LORazepam 0.5 MG tablet Commonly known as: ATIVAN TAKE ONE TABLET ONCE DAILY AS NEEDED FOR ANXIETY.   losartan 100 MG tablet Commonly known as: COZAAR Take 100 mg by mouth daily.   metoprolol succinate 25 MG 24 hr tablet Commonly known as: TOPROL-XL TAKE 1/2 TABLET ONCE A DAY.   Ocuvite Adult 50+ Caps Take 1 capsule by mouth every evening.    omeprazole 40 MG capsule Commonly known as: PRILOSEC TAKE 1 CAPSULE DAILY 1/2 HOUR BEFORE BREAKFAST FOR STOMACH ACID.   Systane 0.4-0.3 % Gel ophthalmic gel Generic drug: Polyethyl Glycol-Propyl Glycol Place 1 application into both eyes daily.   traMADol 50 MG tablet Commonly known as: ULTRAM Take 1 tablet (50 mg total) by mouth every 6 (six) hours as needed.   TRIAMCINOLONE ACETONIDE(NASAL) NA Place 2 sprays into the nose. Each nostril        Review of Systems:  Review of Systems  Constitutional:  Negative for appetite change, fatigue and fever.  HENT:  Negative for congestion, hearing loss, trouble swallowing and voice change.        Improved.   Eyes:  Negative for visual disturbance.  Respiratory:  Negative for cough and shortness of breath.   Cardiovascular:  Negative for leg swelling.  Gastrointestinal:  Negative for abdominal pain and constipation.       X1-2 times in the past 3 months, diet adjustment helped.   Genitourinary:  Negative for dysuria and urgency.       0-1x/night urination, occasionally urinary leakage.   Musculoskeletal:  Positive for arthralgias, back pain and gait problem.  Skin:  Negative for color change.  Neurological:  Negative for dizziness, speech difficulty, weakness and light-headedness.       Tingling in toes sometimes, better after moves around.   Psychiatric/Behavioral:  Negative for confusion and sleep disturbance. The patient is not nervous/anxious.     Health Maintenance  Topic Date Due   DTaP/Tdap/Td (2 - Tdap) 12/14/2022   COVID-19 Vaccine (5 - 2023-24 season) 12/25/2022   Zoster Vaccines- Shingrix (1 of 2) 12/29/2094 (Originally 01/23/1943)   INFLUENZA VACCINE  07/30/2023   Medicare Annual Wellness (AWV)  12/05/2023   Pneumonia Vaccine 9+ Years old  Completed   DEXA SCAN  Completed   HPV VACCINES  Aged Out    Physical Exam: Vitals:   04/23/23 1349  BP: 136/84  Pulse: 84  Temp: 97.7 F (36.5 C)  SpO2: 100%  Weight:  164 lb 9.6 oz (74.7 kg)  Height: 5\' 3"  (1.6 m)   Body mass index is 29.16 kg/m. Physical Exam Vitals and nursing note reviewed.  Constitutional:      Appearance: Normal appearance.  HENT:     Head: Normocephalic and atraumatic.     Mouth/Throat:     Mouth: Mucous membranes are moist.  Eyes:     Extraocular Movements: Extraocular movements intact.     Conjunctiva/sclera: Conjunctivae normal.     Pupils: Pupils are equal, round, and reactive to light.  Cardiovascular:     Rate and Rhythm: Normal rate and regular rhythm.     Heart sounds: No murmur heard. Pulmonary:     Effort: Pulmonary effort is normal.     Breath sounds: Normal breath sounds. No rales.  Abdominal:     General: Bowel sounds are normal.     Palpations: Abdomen is soft.     Tenderness: There is no abdominal tenderness.  Musculoskeletal:     Cervical back: Normal range of motion and neck supple.     Right lower leg: No edema.     Left lower leg: No edema.  Skin:    General: Skin is warm and dry.  Neurological:     General: No focal deficit present.     Mental Status: She is alert and oriented to person, place, and time. Mental status is at baseline.     Gait: Gait abnormal.  Psychiatric:        Mood and Affect: Mood normal.        Behavior: Behavior normal.        Thought Content: Thought content normal.        Judgment: Judgment normal.     Labs reviewed: Basic Metabolic Panel: Recent Labs    06/11/22 1135 01/16/23 1941 02/10/23 0713 02/17/23 1105  NA 143 139  --  142  K 3.5 3.1*  --  4.9  CL 106 105  --  107  CO2 29 24  --  25  GLUCOSE 85 105*  --  117*  BUN 35* 29*  --  31*  CREATININE 0.97* 0.88  --  1.06*  CALCIUM 9.8 9.5  --  9.9  TSH 5.07*  --  5.11*  --    Liver Function Tests: Recent Labs    06/11/22 1135 01/16/23 1941 02/17/23 1105  AST 19 26 20   ALT 13 17 11   ALKPHOS  --  60  --   BILITOT 0.5 0.2* 0.5  PROT 6.3 6.1* 5.9*  ALBUMIN  --  3.7  --    No results for  input(s): "LIPASE", "AMYLASE" in the last 8760 hours. No results for input(s): "AMMONIA" in the last 8760 hours. CBC: Recent Labs    06/11/22 1135 01/16/23 1941  WBC 7.0 6.4  NEUTROABS 3,696 3.8  HGB 11.5* 11.5*  HCT 36.3 35.9*  MCV 95.5 95.7  PLT 206 179   Lipid Panel: Recent Labs    06/11/22 1135  CHOL 139  HDL 56  LDLCALC 70  TRIG 57  CHOLHDL 2.5   Lab Results  Component Value Date   HGBA1C 5.7 (H) 06/11/2022    Procedures since last visit: DG Chest 2 View  Result Date: 04/05/2023 CLINICAL DATA:  Pulmonary nodule described in the right upper lobe on recent cervical spine CT. EXAM: CHEST - 2 VIEW COMPARISON:  01/16/2023 cervical spine CT. 01/05/2022 chest radiograph. FINDINGS: Normal heart size. Moderate hiatal hernia and atherosclerotic aortic arch. Otherwise normal  mediastinal contour. No pneumothorax. No pleural effusion. Lungs appear clear, with no acute consolidative airspace disease and no pulmonary edema. No discrete nodular pulmonary opacities. IMPRESSION: 1. No active cardiopulmonary disease. 2. Patient is due for follow-up noncontrast chest CT for a right upper lobe 6 mm solid pulmonary nodule per the recommendations from 01/16/2023 cervical spine CT to be performed 6-12 months after 01/16/2023. 3. Moderate hiatal hernia. Electronically Signed   By: Delbert Phenix M.D.   On: 04/05/2023 18:00    Assessment/Plan  HTN (hypertension) Blood pressure is controlled,  takes  Losartan, Metoprolol,  takes ASA, Bun/creat 31/1.06 02/17/23  GERD (gastroesophageal reflux disease)  Stable, continue Omeprazole, Hgb 11.5 01/16/23  Anxiety No change, Lorazepam prn available to her.   OSA (obstructive sleep apnea) CPAP, seeing pulmonology.    Labs/tests ordered:  none  Next appt:  3 months

## 2023-04-23 NOTE — Assessment & Plan Note (Signed)
Stable, continue Omeprazole, Hgb 11.5 01/16/23

## 2023-04-24 ENCOUNTER — Encounter: Payer: Self-pay | Admitting: Nurse Practitioner

## 2023-04-27 DIAGNOSIS — N8110 Cystocele, unspecified: Secondary | ICD-10-CM | POA: Diagnosis not present

## 2023-04-28 ENCOUNTER — Ambulatory Visit (HOSPITAL_COMMUNITY)
Admission: RE | Admit: 2023-04-28 | Discharge: 2023-04-28 | Disposition: A | Discharge status: Home / Self Care | Payer: PPO | Source: Ambulatory Visit | Attending: Nurse Practitioner | Admitting: Nurse Practitioner

## 2023-04-28 DIAGNOSIS — R911 Solitary pulmonary nodule: Secondary | ICD-10-CM | POA: Insufficient documentation

## 2023-04-28 DIAGNOSIS — R918 Other nonspecific abnormal finding of lung field: Secondary | ICD-10-CM | POA: Diagnosis not present

## 2023-05-07 NOTE — Telephone Encounter (Signed)
Called and spoke with pt's son who stated he did see pt's CT results in the mychart message that was sent. Told him he could reach out to pt's PCP about the large hiatal hernia for recommendations and he verbalized understanding. Nothing further needed.

## 2023-05-07 NOTE — Telephone Encounter (Signed)
Molly Maduro son is returning message. Robert phone number is [phone].

## 2023-05-20 ENCOUNTER — Non-Acute Institutional Stay: Payer: PPO | Admitting: Family Medicine

## 2023-05-20 ENCOUNTER — Encounter: Payer: Self-pay | Admitting: Family Medicine

## 2023-05-20 VITALS — BP 128/70 | HR 86 | Ht 63.0 in | Wt 164.4 lb

## 2023-05-20 DIAGNOSIS — R609 Edema, unspecified: Secondary | ICD-10-CM

## 2023-05-20 DIAGNOSIS — R269 Unspecified abnormalities of gait and mobility: Secondary | ICD-10-CM

## 2023-05-20 NOTE — Patient Instructions (Signed)
Recommend for leg swelling: Walk Decrease salt intake Drink 4-6 glasses of water daily Elevate legs  To use telephone dial 1-9- area code-number

## 2023-05-20 NOTE — Progress Notes (Signed)
Provider:  Jacalyn Lefevre, MD  Careteam: Patient Care Team: Mast, Man X, NP as PCP - General (Internal Medicine) Mast, Man X, NP as Nurse Practitioner (Internal Medicine)  PLACE OF SERVICE:  [facility]  Advanced Directive information    Allergies  Allergen Reactions   Adhesive [Tape]    Ampicillin    Elemental Sulfur    Nitrofuran Derivatives    Zoloft [Sertraline Hcl] Other (See Comments)    Dizziness, nausea, faint feeling, and nightmares      No chief complaint on file.    HPI: Patient is a 89 y.o. female.  Patient is here today concerned about swelling in her lower legs.  This has been a chronic problem.  She is wearing support stockings today.  She is on no diuretic or medicine that would otherwise cause fluid retention.  She denies shortness of breath or dyspnea with exertion.  In reviewing her labs there is very mild chronic kidney disease stage IIIa.  Review of Systems:  Review of Systems  Respiratory: Negative.    Cardiovascular:  Positive for leg swelling.  All other systems reviewed and are negative.   Past Medical History:  Diagnosis Date   GERD (gastroesophageal reflux disease)    Hx of cataract surgery    both eyes   Hyperglycemia    Hyperlipidemia    Hypertension    Lumbago 04/05/2016   Macular degeneration    ? which eye(s)   Obstructive sleep apnea    Osteoporosis, senile    Overactive bladder    Squamous cell skin cancer 2/16   left lower leg   Past Surgical History:  Procedure Laterality Date   ABDOMINAL HYSTERECTOMY  1960's   APPENDECTOMY  1960's   MOLE REMOVAL  2000   facial   TONSILLECTOMY     Social History:   reports that she has never smoked. She has never used smokeless tobacco. She reports that she does not currently use alcohol. She reports that she does not use drugs.  Family History  Problem Relation Age of Onset   Heart disease Mother    Heart disease Father     Medications: Patient's Medications  New  Prescriptions   No medications on file  Previous Medications   ACETAMINOPHEN (TYLENOL) 500 MG TABLET    Take 500 mg by mouth daily.    ASPIRIN EC 81 MG TABLET    Take 1 tablet (81 mg total) by mouth daily.   CALCIUM CARB-CHOLECALCIFEROL 600-800 MG-UNIT TABS    Take 1 tablet by mouth 2 (two) times daily.    LORAZEPAM (ATIVAN) 0.5 MG TABLET    TAKE ONE TABLET ONCE DAILY AS NEEDED FOR ANXIETY.   LOSARTAN (COZAAR) 100 MG TABLET    Take 100 mg by mouth daily.   METOPROLOL SUCCINATE (TOPROL-XL) 25 MG 24 HR TABLET    TAKE 1/2 TABLET ONCE A DAY.   MULTIPLE VITAMINS-MINERALS (OCUVITE ADULT 50+) CAPS    Take 1 capsule by mouth every evening.   OMEPRAZOLE (PRILOSEC) 40 MG CAPSULE    TAKE 1 CAPSULE DAILY 1/2 HOUR BEFORE BREAKFAST FOR STOMACH ACID.   POLYETHYL GLYCOL-PROPYL GLYCOL (SYSTANE) 0.4-0.3 % GEL OPHTHALMIC GEL    Place 1 application into both eyes daily.   TRAMADOL (ULTRAM) 50 MG TABLET    Take 1 tablet (50 mg total) by mouth every 6 (six) hours as needed.   TRIAMCINOLONE ACETONIDE,NASAL, NA    Place 2 sprays into the nose. Each nostril  Modified Medications  No medications on file  Discontinued Medications   No medications on file    Physical Exam:  There were no vitals filed for this visit. There is no height or weight on file to calculate BMI. Wt Readings from Last 3 Encounters:  04/23/23 164 lb 9.6 oz (74.7 kg)  04/02/23 169 lb (76.7 kg)  03/12/23 169 lb (76.7 kg)    Physical Exam Vitals and nursing note reviewed.  Constitutional:      Appearance: Normal appearance.  Cardiovascular:     Rate and Rhythm: Normal rate and regular rhythm.  Pulmonary:     Effort: Pulmonary effort is normal.     Breath sounds: Normal breath sounds.  Musculoskeletal:     Right lower leg: Edema (Swelling is 1+) present.     Comments: Swelling is 1+ bilaterally  Neurological:     General: No focal deficit present.     Mental Status: She is alert and oriented to person, place, and time.   Psychiatric:        Mood and Affect: Mood normal.        Behavior: Behavior normal.     Labs reviewed: Basic Metabolic Panel: Recent Labs    06/11/22 1135 01/16/23 1941 02/10/23 0713 02/17/23 1105  NA 143 139  --  142  K 3.5 3.1*  --  4.9  CL 106 105  --  107  CO2 29 24  --  25  GLUCOSE 85 105*  --  117*  BUN 35* 29*  --  31*  CREATININE 0.97* 0.88  --  1.06*  CALCIUM 9.8 9.5  --  9.9  TSH 5.07*  --  5.11*  --    Liver Function Tests: Recent Labs    06/11/22 1135 01/16/23 1941 02/17/23 1105  AST 19 26 20   ALT 13 17 11   ALKPHOS  --  60  --   BILITOT 0.5 0.2* 0.5  PROT 6.3 6.1* 5.9*  ALBUMIN  --  3.7  --    No results for input(s): "LIPASE", "AMYLASE" in the last 8760 hours. No results for input(s): "AMMONIA" in the last 8760 hours. CBC: Recent Labs    06/11/22 1135 01/16/23 1941  WBC 7.0 6.4  NEUTROABS 3,696 3.8  HGB 11.5* 11.5*  HCT 36.3 35.9*  MCV 95.5 95.7  PLT 206 179   Lipid Panel: Recent Labs    06/11/22 1135  CHOL 139  HDL 56  LDLCALC 70  TRIG 57  CHOLHDL 2.5   TSH: Recent Labs    06/11/22 1135 02/10/23 0713  TSH 5.07* 5.11*   A1C: Lab Results  Component Value Date   HGBA1C 5.7 (H) 06/11/2022     Assessment/Plan  1. Abnormality of gait Uses a walker.  Encouraged ambulation to help dependent edema  2. Edema, unspecified type Rather than using a diuretic I have asked her to observe the following: Decrease salt intake, increase water intake, elevate legs whenever possible and walk is much as possible.  Continue to use support hose    Jacalyn Lefevre, MD [facility] [phone]

## 2023-06-01 ENCOUNTER — Telehealth: Payer: Self-pay | Admitting: Internal Medicine

## 2023-06-04 NOTE — Telephone Encounter (Signed)
Spoke with [employer] they state order for new cpap is in there system and being processed. She wasn't sure why it had taken so long but would call and update the family. She advised they would make patient apt to come pick up new machine. I have called son Molly Maduro to advise. NFN

## 2023-06-05 ENCOUNTER — Other Ambulatory Visit: Payer: Self-pay

## 2023-06-05 ENCOUNTER — Emergency Department (HOSPITAL_COMMUNITY)
Admission: EM | Admit: 2023-06-05 | Discharge: 2023-06-05 | Disposition: A | Discharge status: Home / Self Care | Payer: PPO | Attending: Emergency Medicine | Admitting: Emergency Medicine

## 2023-06-05 ENCOUNTER — Encounter (HOSPITAL_COMMUNITY): Payer: Self-pay | Admitting: Emergency Medicine

## 2023-06-05 ENCOUNTER — Emergency Department (HOSPITAL_COMMUNITY): Payer: PPO

## 2023-06-05 DIAGNOSIS — R9082 White matter disease, unspecified: Secondary | ICD-10-CM | POA: Diagnosis not present

## 2023-06-05 DIAGNOSIS — Z7982 Long term (current) use of aspirin: Secondary | ICD-10-CM | POA: Diagnosis not present

## 2023-06-05 DIAGNOSIS — I10 Essential (primary) hypertension: Secondary | ICD-10-CM | POA: Diagnosis not present

## 2023-06-05 DIAGNOSIS — W1830XA Fall on same level, unspecified, initial encounter: Secondary | ICD-10-CM | POA: Insufficient documentation

## 2023-06-05 DIAGNOSIS — S199XXA Unspecified injury of neck, initial encounter: Secondary | ICD-10-CM | POA: Diagnosis not present

## 2023-06-05 DIAGNOSIS — M549 Dorsalgia, unspecified: Secondary | ICD-10-CM | POA: Diagnosis not present

## 2023-06-05 DIAGNOSIS — M47814 Spondylosis without myelopathy or radiculopathy, thoracic region: Secondary | ICD-10-CM | POA: Diagnosis not present

## 2023-06-05 DIAGNOSIS — S50311A Abrasion of right elbow, initial encounter: Secondary | ICD-10-CM | POA: Insufficient documentation

## 2023-06-05 DIAGNOSIS — N3000 Acute cystitis without hematuria: Secondary | ICD-10-CM | POA: Diagnosis not present

## 2023-06-05 DIAGNOSIS — Z79899 Other long term (current) drug therapy: Secondary | ICD-10-CM | POA: Diagnosis not present

## 2023-06-05 DIAGNOSIS — S0990XA Unspecified injury of head, initial encounter: Secondary | ICD-10-CM | POA: Diagnosis not present

## 2023-06-05 DIAGNOSIS — M791 Myalgia, unspecified site: Secondary | ICD-10-CM | POA: Insufficient documentation

## 2023-06-05 DIAGNOSIS — W19XXXA Unspecified fall, initial encounter: Secondary | ICD-10-CM

## 2023-06-05 LAB — CBC WITH DIFFERENTIAL/PLATELET
Abs Immature Granulocytes: 0.03 10*3/uL (ref 0.00–0.07)
Basophils Absolute: 0.1 10*3/uL (ref 0.0–0.1)
Basophils Relative: 1 %
Eosinophils Absolute: 0.1 10*3/uL (ref 0.0–0.5)
Eosinophils Relative: 1 %
HCT: 37 % (ref 36.0–46.0)
Hemoglobin: 11.8 g/dL — ABNORMAL LOW (ref 12.0–15.0)
Immature Granulocytes: 0 %
Lymphocytes Relative: 14 %
Lymphs Abs: 1.5 10*3/uL (ref 0.7–4.0)
MCH: 29.9 pg (ref 26.0–34.0)
MCHC: 31.9 g/dL (ref 30.0–36.0)
MCV: 93.9 fL (ref 80.0–100.0)
Monocytes Absolute: 0.6 10*3/uL (ref 0.1–1.0)
Monocytes Relative: 6 %
Neutro Abs: 8.2 10*3/uL — ABNORMAL HIGH (ref 1.7–7.7)
Neutrophils Relative %: 78 %
Platelets: 174 10*3/uL (ref 150–400)
RBC: 3.94 MIL/uL (ref 3.87–5.11)
RDW: 15 % (ref 11.5–15.5)
WBC: 10.5 10*3/uL (ref 4.0–10.5)
nRBC: 0 % (ref 0.0–0.2)

## 2023-06-05 LAB — URINALYSIS, W/ REFLEX TO CULTURE (INFECTION SUSPECTED)
Bilirubin Urine: NEGATIVE
Glucose, UA: NEGATIVE mg/dL
Ketones, ur: 5 mg/dL — AB
Leukocytes,Ua: NEGATIVE
Nitrite: POSITIVE — AB
Protein, ur: NEGATIVE mg/dL
Specific Gravity, Urine: 1.019 (ref 1.005–1.030)
pH: 5 (ref 5.0–8.0)

## 2023-06-05 LAB — COMPREHENSIVE METABOLIC PANEL
ALT: 22 U/L (ref 0–44)
AST: 35 U/L (ref 15–41)
Albumin: 3.7 g/dL (ref 3.5–5.0)
Alkaline Phosphatase: 61 U/L (ref 38–126)
Anion gap: 10 (ref 5–15)
BUN: 20 mg/dL (ref 8–23)
CO2: 25 mmol/L (ref 22–32)
Calcium: 10 mg/dL (ref 8.9–10.3)
Chloride: 109 mmol/L (ref 98–111)
Creatinine, Ser: 0.84 mg/dL (ref 0.44–1.00)
GFR, Estimated: >60 mL/min (ref >60)
Glucose, Bld: 92 mg/dL (ref 70–99)
Potassium: 4.5 mmol/L (ref 3.5–5.1)
Sodium: 144 mmol/L (ref 135–145)
Total Bilirubin: 0.9 mg/dL (ref 0.3–1.2)
Total Protein: 5.8 g/dL — ABNORMAL LOW (ref 6.5–8.1)

## 2023-06-05 LAB — CK: Total CK: 486 U/L — ABNORMAL HIGH (ref 38–234)

## 2023-06-05 MED ORDER — CEPHALEXIN 250 MG PO CAPS
500.0000 mg | ORAL_CAPSULE | Freq: Once | ORAL | Status: AC
  Administered 2023-06-05: 500 mg via ORAL
  Filled 2023-06-05: qty 2

## 2023-06-05 MED ORDER — CEPHALEXIN 500 MG PO CAPS: 500.0000 mg | ORAL_CAPSULE | Freq: Two times a day (BID) | ORAL | 0 refills | Status: AC

## 2023-06-05 MED ORDER — ACETAMINOPHEN 500 MG PO TABS
1000.0000 mg | ORAL_TABLET | Freq: Once | ORAL | Status: AC
  Administered 2023-06-05: 1000 mg via ORAL
  Filled 2023-06-05: qty 2

## 2023-06-05 NOTE — ED Notes (Signed)
Pt ambulate with nurse in the hallway
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
## 2023-06-05 NOTE — ED Notes (Signed)
Report was given to the facility. Pt son told facility he will be taking her home to [facility] with him

## 2023-06-05 NOTE — ED Provider Notes (Signed)
[place] EMERGENCY DEPARTMENT AT [facility] Provider Note   CSN: [id number] Arrival date & time: 06/05/23  1401     History  Chief Complaint  Patient presents with   Back Pain    MABREY HOWLAND is a 89 y.o. female.  Patient is a 89 year old female with a history of hypertension, hyperlipidemia, overactive bladder who lives at an assisted living facility and is being brought in by EMS today after being found down in the floor.  Patient reports that yesterday was a normal day however she said yesterday this sky looked terrible and she was concerned that there was going to be a tornado so she went to lay down into the bathtub.  While she was laying in the bathtub she said she started feeling very uncomfortable and could not find a comfortable position.  She then started having significant back and neck pain.  She was not able to stand and walk to get out of the bathtub but did eventually crawl out of the bathtub and crawled on the floor to another room in her house.  She reports when all this happened it was dark outside but nobody found her at the assisted living facility until this morning and then called EMS.  EMS reports patient is having significant neck and back pain.  Patient denies hitting her head or loss of consciousness.  She denies significant pain in her arms or legs but does report pain in her back with any movement.  She thinks the last meal she ate was lunch yesterday and she has not taken any of her medications this morning.  She denies any chest pain, shortness of breath, abdominal pain, nausea or vomiting.  The history is provided by the patient and the EMS personnel.  Back Pain      Home Medications Prior to Admission medications   Medication Sig Start Date End Date Taking? Authorizing Provider  acetaminophen (TYLENOL) 500 MG tablet Take 500 mg by mouth daily.     [provider]  aspirin EC 81 MG tablet Take 1 tablet (81 mg total) by mouth daily.  07/07/17   Oneal Grout, MD  Calcium Carb-Cholecalciferol 600-800 MG-UNIT TABS Take 1 tablet by mouth 2 (two) times daily.     [provider]  LORazepam (ATIVAN) 0.5 MG tablet TAKE ONE TABLET ONCE DAILY AS NEEDED FOR ANXIETY. 01/06/23   Mast, Man X, NP  losartan (COZAAR) 100 MG tablet Take 100 mg by mouth daily.    [provider]  metoprolol succinate (TOPROL-XL) 25 MG 24 hr tablet TAKE 1/2 TABLET ONCE A DAY. 08/13/22   Mast, Man X, NP  Multiple Vitamins-Minerals (OCUVITE ADULT 50+) CAPS Take 1 capsule by mouth every evening.    [provider]  omeprazole (PRILOSEC) 40 MG capsule TAKE 1 CAPSULE DAILY 1/2 HOUR BEFORE BREAKFAST FOR STOMACH ACID. 10/03/22   Mast, Man X, NP  Polyethyl Glycol-Propyl Glycol (SYSTANE) 0.4-0.3 % GEL ophthalmic gel Place 1 application into both eyes daily.    [provider]  traMADol (ULTRAM) 50 MG tablet Take 1 tablet (50 mg total) by mouth every 6 (six) hours as needed. 08/28/22   Mast, Man X, NP  TRIAMCINOLONE ACETONIDE,NASAL, NA Place 2 sprays into the nose. Each nostril    [provider]      Allergies    Adhesive [tape], Ampicillin, Elemental sulfur, Nitrofuran derivatives, and Zoloft [sertraline hcl]    Review of Systems   Review of Systems  Musculoskeletal:  Positive for back pain.    Physical Exam Updated Vital Signs There were no vitals taken for this visit. Physical Exam Vitals and nursing note reviewed.  Constitutional:      General: She is not in acute distress.    Appearance: She is well-developed.  HENT:     Head: Normocephalic and atraumatic.  Eyes:     Conjunctiva/sclera: Conjunctivae normal.     Pupils: Pupils are equal, round, and reactive to light.  Neck:   Cardiovascular:     Rate and Rhythm: Normal rate and regular rhythm.     Pulses: Normal pulses.     Heart sounds: No murmur heard. Pulmonary:     Effort: Pulmonary effort is normal. No respiratory distress.     Breath sounds: Normal  breath sounds. No wheezing or rales.  Abdominal:     General: There is no distension.     Palpations: Abdomen is soft.     Tenderness: There is no abdominal tenderness. There is no guarding or rebound.  Musculoskeletal:        General: Normal range of motion.     Cervical back: Normal range of motion and neck supple. Tenderness present. Muscular tenderness present. No spinous process tenderness.     Thoracic back: Tenderness and bony tenderness present.       Back:     Right lower leg: No edema.     Left lower leg: No edema.     Comments: Mild tenderness with palpation of the left quadricep but normal range of motion of bilateral hips.  Patient is able to lift her legs off the table without difficulty.  Able to range both arms without difficulty.  Minor abrasion on the right elbow.  Skin:    General: Skin is warm and dry.     Findings: No erythema or rash.  Neurological:     Mental Status: She is alert and oriented to person, place, and time. Mental status is at baseline.     Sensory: No sensory deficit.     Motor: No weakness.     Comments: 5 out of 5 strength in the bilateral upper and lower extremities, sensation is intact.  No pronator drift.  Psychiatric:        Mood and Affect: Mood normal.        Behavior: Behavior normal.     ED Results / Procedures / Treatments   Labs (all labs ordered are listed, but only abnormal results are displayed) Labs Reviewed - No data to display  EKG None  Radiology No results found.  Procedures Procedures    Medications Ordered in ED Medications  acetaminophen (TYLENOL) tablet 1,000 mg (has no administration in time range)    ED Course/ Medical Decision Making/ A&P                             Medical Decision Making Amount and/or Complexity of Data Reviewed Labs: ordered. Radiology: ordered and independent interpretation performed. Decision-making details documented in ED Course. ECG/medicine tests: ordered and independent  interpretation performed. Decision-making details documented in ED Course.  Risk OTC drugs.   Pt with multiple medical problems and comorbidities and presenting today with a complaint that caries a high risk for morbidity and mortality.  Here today after experiencing neck and back pain when she tried to lay in the bathtub when she thought there may be a tornado and being unable to get out of the bathtub  and walk back to her room.  Patient laid on the floor for more than 12 hours.  She is currently awake and alert but is still having cervical and thoracic pain.  She is neurologically intact at this time and able to move both legs and upper extremities without difficulty.  She denies any recent infectious symptoms.  Concern for possible compression fracture.  Also concern for traumatic rhabdomyolysis.  Labs pending.  Imaging pending.  She does complain of a mild pressure in her head but denies hitting her head and does not take anticoagulation.  3:57 PM I independently interpreted patient's EKG which showed no acute findings.  I have independently visualized and interpreted pt's images today.  Head CT today without evidence of intracranial bleed, cervical and thoracic spine without sign of acute fracture.  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          Final Clinical Impression(s) / ED Diagnoses Final diagnoses:  None    Rx / DC Orders ED Discharge Orders     None         Gwyneth Sprout, MD 06/05/23 1558

## 2023-06-05 NOTE — ED Provider Notes (Signed)
89 yo female presenting from nursing facility with pain in her neck and back after lying in bathtub yesterday  CT imaging with no acute fracture  Pending labs, CK level  Physical Exam  BP (!) 145/72 (BP Location: Right Arm)   Pulse 70   Temp (!) 97.5 F (36.4 C) (Oral)   Resp 18   Ht 5\' 3"  (1.6 m)   Wt 73.9 kg   SpO2 100%   BMI 28.87 kg/m   Physical Exam  Procedures  Procedures  ED Course / MDM   Clinical Course as of 06/05/23 1912  Fri Jun 05, 2023  1724 Patient reassessed and is feeling better.  She wants to attempt to ambulate, typically uses a walker at baseline.  We are also awaiting a UA.  Her son is at bedside and reports of the patient has no emergent findings, he is comfortable taking her home to stay with the family and her for the weekend.  I think this is a reasonable plan. [MT]  9604 Patient easily ambulated around the emergency department with her walker. [MT]  1912 UA consistent with UTI.  Foul-smelling urine.  Will discharge on Keflex.  Patient is comfortable, behaving normally according to her son at the bedside, and okay for discharge [MT]    Clinical Course User Index [MT] Chukwudi Ewen, Kermit Balo, MD   Medical Decision Making Amount and/or Complexity of Data Reviewed Labs: ordered. Radiology: ordered.  Risk OTC drugs. Prescription drug management.         Terald Sleeper, MD 06/05/23 [phone]

## 2023-06-07 LAB — URINE CULTURE: Culture: 80000 — AB

## 2023-06-08 ENCOUNTER — Telehealth (HOSPITAL_BASED_OUTPATIENT_CLINIC_OR_DEPARTMENT_OTHER): Payer: Self-pay | Admitting: *Deleted

## 2023-06-08 NOTE — Telephone Encounter (Signed)
Post ED Visit - Positive Culture Follow-up  Culture report reviewed by antimicrobial stewardship pharmacist: [facility] []  Camelia Phenes, Pharm.D. []  Celedonio Miyamoto, Pharm.D., BCPS AQ-ID []  Garvin Fila, Pharm.D., BCPS []  Georgina Pillion, Pharm.D., BCPS []  [place], [place].D., BCPS, AAHIVP []  Estella Husk, Pharm.D., BCPS, AAHIVP []  Lysle Pearl, PharmD, BCPS []  Phillips Climes, PharmD, BCPS []  Agapito Games, PharmD, BCPS []  Verlan Friends, PharmD []  Mervyn Gay, PharmD, BCPS []  Vinnie Level, PharmD  [facility] []  Len Childs, PharmD []  Greer Pickerel, PharmD []  Adalberto Cole, PharmD []  Perlie Gold, Rph []  Lonell Face) Jean Rosenthal, PharmD []  Earl Many, PharmD []  Junita Push, PharmD []  Dorna Leitz, PharmD []  Terrilee Files, PharmD []  Lynann Beaver, PharmD []  Keturah Barre, PharmD []  Loralee Pacas, PharmD []  Bernadene Person, PharmD   Positive urine culture Treated with Cephalexin, organism sensitive to the same and no further patient follow-up is required at this time.  Virl Axe [facility] 06/08/2023, 9:13 AM

## 2023-06-09 ENCOUNTER — Non-Acute Institutional Stay: Payer: PPO | Admitting: Nurse Practitioner

## 2023-06-09 DIAGNOSIS — G4733 Obstructive sleep apnea (adult) (pediatric): Secondary | ICD-10-CM

## 2023-06-09 DIAGNOSIS — E782 Mixed hyperlipidemia: Secondary | ICD-10-CM

## 2023-06-09 DIAGNOSIS — I4892 Unspecified atrial flutter: Secondary | ICD-10-CM | POA: Diagnosis not present

## 2023-06-09 DIAGNOSIS — F419 Anxiety disorder, unspecified: Secondary | ICD-10-CM

## 2023-06-09 DIAGNOSIS — K219 Gastro-esophageal reflux disease without esophagitis: Secondary | ICD-10-CM | POA: Diagnosis not present

## 2023-06-09 DIAGNOSIS — I10 Essential (primary) hypertension: Secondary | ICD-10-CM

## 2023-06-09 DIAGNOSIS — N390 Urinary tract infection, site not specified: Secondary | ICD-10-CM | POA: Diagnosis not present

## 2023-06-09 DIAGNOSIS — M159 Polyosteoarthritis, unspecified: Secondary | ICD-10-CM

## 2023-06-09 DIAGNOSIS — M8580 Other specified disorders of bone density and structure, unspecified site: Secondary | ICD-10-CM

## 2023-06-09 DIAGNOSIS — W19XXXD Unspecified fall, subsequent encounter: Secondary | ICD-10-CM

## 2023-06-09 DIAGNOSIS — R7303 Prediabetes: Secondary | ICD-10-CM | POA: Diagnosis not present

## 2023-06-09 DIAGNOSIS — R7989 Other specified abnormal findings of blood chemistry: Secondary | ICD-10-CM | POA: Diagnosis not present

## 2023-06-09 DIAGNOSIS — W19XXXA Unspecified fall, initial encounter: Secondary | ICD-10-CM | POA: Insufficient documentation

## 2023-06-09 DIAGNOSIS — Z780 Asymptomatic menopausal state: Secondary | ICD-10-CM

## 2023-06-09 NOTE — Assessment & Plan Note (Signed)
declined Alendronate after initial consented.  °

## 2023-06-09 NOTE — Assessment & Plan Note (Signed)
CPAP, seeing pulmonology.  

## 2023-06-09 NOTE — Assessment & Plan Note (Signed)
off Atorvastatin, LDL 70 06/11/22 

## 2023-06-09 NOTE — Assessment & Plan Note (Signed)
Back pain mid to lower portion, better, X-ray T/L spine showed multilevel degenerative changes, no acute findings CT head/C+T+L spine, and pelvis,  taking Tylenol, Tramadol 

## 2023-06-09 NOTE — Assessment & Plan Note (Signed)
Elevated TSH 5.07 06/11/22, TSH 5.11 02/10/23 

## 2023-06-09 NOTE — Assessment & Plan Note (Signed)
A-flutter 01/05/22, no recurrence, delay cardiology 

## 2023-06-09 NOTE — Assessment & Plan Note (Signed)
ED eval 06/05/23 for fall, CT head/cervical spine, thoracic spine showed no acute fracture, she was found to have UTI even if the patient stated she is asymptomatic, urine culture: E coli >100,000c/ml, 7 day course of Keflex started, tolerated well.

## 2023-06-09 NOTE — Assessment & Plan Note (Signed)
,   Lorazepam prn available to her.

## 2023-06-09 NOTE — Assessment & Plan Note (Signed)
ED eval 06/05/23 for fall, CT head/cervical spine, thoracic spine showed no acute fracture, she was found to have UTI even if the patient stated she is asymptomatic, urine culture: E coli >100,000c/ml, 7 day course of Keflex started, tolerated well.  Therapy to eval and tx.

## 2023-06-09 NOTE — Progress Notes (Unsigned)
Location:   [facility] Room Number: 820 Place of Service:  AL[facility] (13) Provider: Arna Snipe Keyondra Lagrand NP  Mayerly Kaman X, NP  Patient Care Team: Belal Scallon X, NP as PCP - General (Internal Medicine) Amylia Collazos X, NP as Nurse Practitioner (Internal Medicine)  Extended Emergency Contact Information Primary Emergency Contact: [facility] Address: [address]          [place], [place] [address] D[place] of [place] Home Phone: [phone] Mobile Phone: [phone] Relation: Son  Code Status: DNR Goals of care: Advanced Directive information    06/05/2023    7:03 PM  Advanced Directives  Does Patient Have a Medical Advance Directive? Yes  Type of Advance Directive Living will;Healthcare Power of Attorney     Chief Complaint  Patient presents with   Acute Visit    Follow up ED visit for fall    HPI:  Pt is a 89 y.o. female seen today for an acute visit for follow up ED evaluation 06/05/23.   ED eval 06/05/23 for fall, CT head/cervical spine, thoracic spine showed no acute fracture, she was found to have UTI even if the patient stated she is asymptomatic, urine culture: E coli >100,000c/ml, 7 day course of Keflex started, tolerated well.   ED eval 01/16/23 near syncope, fall, EKG, CT head/cervical spine, X-ray R knee, CBC, CMP unremarkable.             CT head/cervical spine, incidental finding: 6 mm right solid pulmonary nodule within the upper lobe. Per Fleischner Society Guidelines, recommend a non-contrast Chest CT at 6-12 months-ordered. pending CT chest wo CM. CXR 04/02/23 no acute process.              Edema BLE, not apparent, TED, Bun/creat 20/0.84             OSA, CPAP, seeing pulmonology.              A-flutter 01/05/22, no recurrence, delay cardiology OP declined Alendronate after initial consented.  Back pain mid to lower portion, better, X-ray T/L spine showed multilevel degenerative changes, no acute findings CT head/C+T+L spine, and pelvis,  taking Tylenol, Tramadol              Bone spur R foot, callus of foot, f/u podiatrist.              Allergic rhinitis, off Fluticasone nasal spray, Claritin, Atrovent nasal spray.              Anxiety, Lorazepam prn available to her.              Elevated TSH 5.07 06/11/22, TSH 5.11 02/10/23             HTN, dc'd Losartan by Dr. Maple Hudson 04/02/23? The patient is still taking Losartan 100mg  qd,  continue Metoprolol,  takes ASA, Bun/creat 20/0.84 06/05/23             GERD, stable on Omeprazole, Hgb 11.8 06/05/23             Hyperlipidemia, off Atorvastatin, LDL 70 06/11/22             Prediabetes, diet controlled, Hgb a1c 5.7 06/11/22     Past Medical History:  Diagnosis Date   GERD (gastroesophageal reflux disease)    Hx of cataract surgery    both eyes   Hyperglycemia    Hyperlipidemia    Hypertension    Lumbago 04/05/2016   Macular degeneration    ? which  eye(s)   Obstructive sleep apnea    Osteoporosis, senile    Overactive bladder    Squamous cell skin cancer 2/16   left lower leg   Past Surgical History:  Procedure Laterality Date   ABDOMINAL HYSTERECTOMY  1960's   APPENDECTOMY  1960's   MOLE REMOVAL  2000   facial   TONSILLECTOMY      Allergies  Allergen Reactions   Adhesive [Tape]    Ampicillin    Elemental Sulfur    Nitrofuran Derivatives    Zoloft [Sertraline Hcl] Other (See Comments)    Dizziness, nausea, faint feeling, and nightmares      Allergies as of 06/09/2023       Reactions   Adhesive [tape]    Ampicillin    Elemental Sulfur    Nitrofuran Derivatives    Zoloft [sertraline Hcl] Other (See Comments)   Dizziness, nausea, faint feeling, and nightmares          Medication List        Accurate as of June 09, 2023 11:59 PM. If you have any questions, ask your nurse or doctor.          acetaminophen 500 MG tablet Commonly known as: TYLENOL Take 500 mg by mouth daily.   aspirin EC 81 MG tablet Take 1 tablet (81 mg total) by mouth daily.   Calcium Carb-Cholecalciferol 600-800  MG-UNIT Tabs Take 1 tablet by mouth 2 (two) times daily.   cephALEXin 500 MG capsule Commonly known as: KEFLEX Take 1 capsule (500 mg total) by mouth 2 (two) times daily for 7 days.   LORazepam 0.5 MG tablet Commonly known as: ATIVAN TAKE ONE TABLET ONCE DAILY AS NEEDED FOR ANXIETY.   losartan 100 MG tablet Commonly known as: COZAAR Take 100 mg by mouth daily.   metoprolol succinate 25 MG 24 hr tablet Commonly known as: TOPROL-XL TAKE 1/2 TABLET ONCE A DAY.   Ocuvite Adult 50+ Caps Take 1 capsule by mouth every evening.   omeprazole 40 MG capsule Commonly known as: PRILOSEC TAKE 1 CAPSULE DAILY 1/2 HOUR BEFORE BREAKFAST FOR STOMACH ACID.   Systane 0.4-0.3 % Gel ophthalmic gel Generic drug: Polyethyl Glycol-Propyl Glycol Place 1 application into both eyes daily.   traMADol 50 MG tablet Commonly known as: ULTRAM Take 1 tablet (50 mg total) by mouth every 6 (six) hours as needed.   TRIAMCINOLONE ACETONIDE(NASAL) NA Place 2 sprays into the nose. Each nostril        Review of Systems  Constitutional:  Negative for appetite change, fatigue and fever.  HENT:  Negative for congestion, hearing loss, trouble swallowing and voice change.        Improved.   Eyes:  Negative for visual disturbance.  Respiratory:  Negative for cough and shortness of breath.   Cardiovascular:  Negative for leg swelling.  Gastrointestinal:  Negative for abdominal pain and constipation.       X1-2 times in the past 3 months, diet adjustment helped.   Genitourinary:  Negative for dysuria and urgency.       0-1x/night urination, occasionally urinary leakage.   Musculoskeletal:  Positive for arthralgias, back pain and gait problem.  Skin:  Negative for color change.  Neurological:  Negative for dizziness, speech difficulty, weakness and light-headedness.       Tingling in toes sometimes, better after moves around.   Psychiatric/Behavioral:  Negative for confusion and sleep disturbance. The patient  is not nervous/anxious.     Immunization History  Administered Date(s)  Administered   Fluad Quad(high Dose 65+) 10/12/2019, 10/14/2022   Influenza Whole 09/30/2018   Influenza, High Dose Seasonal PF 10/07/2017, 09/28/2021   Influenza-Unspecified 09/27/2015, 10/09/2016, 10/10/2020   Moderna Covid-19 Vaccine Bivalent Booster 71yrs & up 10/30/2022   Moderna Sars-Covid-2 Vaccination 01/02/2020, 01/30/2020, 11/06/2020   Pfizer Fall 2023 Covid-19 Vaccine 6mos thru 16yrs  05/16/2022   Pneumococcal Conjugate-13 01/23/2015   Pneumococcal Polysaccharide-23 10/20/2007   Respiratory Syncytial Virus Vaccine,Recomb Aduvanted(Arexvy) 12/26/2022   Td 12/14/2012   Pertinent  Health Maintenance Due  Topic Date Due   INFLUENZA VACCINE  07/30/2023   DEXA SCAN  Completed      12/04/2022    9:48 AM 03/05/2023    2:48 PM 03/12/2023    3:32 PM 04/23/2023    1:49 PM 05/20/2023    2:12 PM  Fall Risk  Falls in the past year? 0 0 0 0 0  Was there an injury with Fall? 0 0 0 0 0  Fall Risk Category Calculator 0 0 0 0 0  Fall Risk Category (Retired) Low      (RETIRED) Patient Fall Risk Level Low fall risk      Patient at Risk for Falls Due to No Fall Risks No Fall Risks Impaired balance/gait;Impaired mobility History of fall(s) History of fall(s)  Fall risk Follow up Falls evaluation completed Falls evaluation completed Falls evaluation completed Falls evaluation completed Falls evaluation completed   Functional Status Survey:    Vitals:   06/09/23 1116  BP: 138/80  Pulse: 85  Resp: 16  Temp: (!) 97.1 F (36.2 C)  SpO2: 99%   There is no height or weight on file to calculate BMI. Physical Exam Vitals and nursing note reviewed.  Constitutional:      Appearance: Normal appearance.  HENT:     Head: Normocephalic and atraumatic.     Mouth/Throat:     Mouth: Mucous membranes are moist.  Eyes:     Extraocular Movements: Extraocular movements intact.     Conjunctiva/sclera: Conjunctivae normal.      Pupils: Pupils are equal, round, and reactive to light.  Cardiovascular:     Rate and Rhythm: Normal rate and regular rhythm.     Heart sounds: No murmur heard. Pulmonary:     Effort: Pulmonary effort is normal.     Breath sounds: Normal breath sounds. No rales.  Abdominal:     General: Bowel sounds are normal.     Palpations: Abdomen is soft.     Tenderness: There is no abdominal tenderness.  Musculoskeletal:     Cervical back: Normal range of motion and neck supple.     Right lower leg: No edema.     Left lower leg: No edema.  Skin:    General: Skin is warm and dry.  Neurological:     General: No focal deficit present.     Mental Status: She is alert and oriented to person, place, and time. Mental status is at baseline.     Gait: Gait abnormal.  Psychiatric:        Mood and Affect: Mood normal.        Behavior: Behavior normal.        Thought Content: Thought content normal.        Judgment: Judgment normal.     Labs reviewed: Recent Labs    01/16/23 1941 02/17/23 1105 06/05/23 1548  NA 139 142 144  K 3.1* 4.9 4.5  CL 105 107 109  CO2 24 25 25  GLUCOSE 105* 117* 92  BUN 29* 31* 20  CREATININE 0.88 1.06* 0.84  CALCIUM 9.5 9.9 10.0   Recent Labs    01/16/23 1941 02/17/23 1105 06/05/23 1548  AST 26 20 35  ALT 17 11 22   ALKPHOS 60  --  61  BILITOT 0.2* 0.5 0.9  PROT 6.1* 5.9* 5.8*  ALBUMIN 3.7  --  3.7   Recent Labs    06/11/22 1135 01/16/23 1941 06/05/23 1548  WBC 7.0 6.4 10.5  NEUTROABS 3,696 3.8 8.2*  HGB 11.5* 11.5* 11.8*  HCT 36.3 35.9* 37.0  MCV 95.5 95.7 93.9  PLT 206 179 174   Lab Results  Component Value Date   TSH 5.11 (H) 02/10/2023   Lab Results  Component Value Date   HGBA1C 5.7 (H) 06/11/2022   Lab Results  Component Value Date   CHOL 139 06/11/2022   HDL 56 06/11/2022   LDLCALC 70 06/11/2022   TRIG 57 06/11/2022   CHOLHDL 2.5 06/11/2022    Significant Diagnostic Results in last 30 days:  CT Thoracic Spine Wo  Contrast  Result Date: 06/05/2023 CLINICAL DATA:  Back trauma. EXAM: CT THORACIC SPINE WITHOUT CONTRAST TECHNIQUE: Multidetector CT images of the thoracic were obtained using the standard protocol without intravenous contrast. RADIATION DOSE REDUCTION: This exam was performed according to the departmental dose-optimization program which includes automated exposure control, adjustment of the mA and/or kV according to patient size and/or use of iterative reconstruction technique. COMPARISON:  06/03/2021 FINDINGS: Alignment: Mild curvature of the thoracic spine convex right unchanged. Vertebrae: Vertebral body heights are maintained. There is moderate spondylosis throughout the thoracic spine. No acute fracture or subluxation. Mild neural foraminal narrowing at several levels of the lower thoracic spine due to adjacent bony spurring. Paraspinal and other soft tissues: Negative.  No canal stenosis. Disc levels: Mild vacuum disc over several levels of the lower thoracic spine and thoracolumbar junction unchanged. Moderate size hiatal hernia is present. This aortic atherosclerosis with significant atherosclerotic plaque over the thoracoabdominal aorta. IMPRESSION: 1. No acute findings. 2. Moderate spondylosis of the thoracic spine. 3. Moderate size hiatal hernia. 4. Aortic atherosclerosis. Aortic Atherosclerosis (ICD10-I70.0). Electronically Signed   By: Elberta Fortis M.D.   On: 06/05/2023 15:58   CT Head Wo Contrast  Result Date: 06/05/2023 CLINICAL DATA:  Head and neck trauma EXAM: CT HEAD WITHOUT CONTRAST CT CERVICAL SPINE WITHOUT CONTRAST TECHNIQUE: Multidetector CT imaging of the head and cervical spine was performed following the standard protocol without intravenous contrast. Multiplanar CT image reconstructions of the cervical spine were also generated. RADIATION DOSE REDUCTION: This exam was performed according to the departmental dose-optimization program which includes automated exposure control, adjustment  of the mA and/or kV according to patient size and/or use of iterative reconstruction technique. COMPARISON:  01/16/2019 FINDINGS: CT HEAD FINDINGS Brain: No evidence of acute infarction, hemorrhage, hydrocephalus, extra-axial collection or mass lesion/mass effect. Extensive periventricular and deep white matter hypodensity. Vascular: No hyperdense vessel or unexpected calcification. Skull: Normal. Negative for fracture or focal lesion. Sinuses/Orbits: No acute finding. Other: None. CT CERVICAL SPINE FINDINGS Alignment: Normal. Skull base and vertebrae: No acute fracture. No primary bone lesion or focal pathologic process. Soft tissues and spinal canal: No prevertebral fluid or swelling. No visible canal hematoma. Disc levels: Moderate multilevel disc space height loss and osteophytosis, worst at the lower cervical levels. Upper chest: Negative. Other: None. IMPRESSION: 1. No acute intracranial pathology. Small-vessel white matter disease. 2. No fracture or static subluxation of the cervical spine.  3. Moderate multilevel cervical disc degenerative disease. Electronically Signed   By: Jearld Lesch M.D.   On: 06/05/2023 15:23   CT Cervical Spine Wo Contrast  Result Date: 06/05/2023 CLINICAL DATA:  Head and neck trauma EXAM: CT HEAD WITHOUT CONTRAST CT CERVICAL SPINE WITHOUT CONTRAST TECHNIQUE: Multidetector CT imaging of the head and cervical spine was performed following the standard protocol without intravenous contrast. Multiplanar CT image reconstructions of the cervical spine were also generated. RADIATION DOSE REDUCTION: This exam was performed according to the departmental dose-optimization program which includes automated exposure control, adjustment of the mA and/or kV according to patient size and/or use of iterative reconstruction technique. COMPARISON:  01/16/2019 FINDINGS: CT HEAD FINDINGS Brain: No evidence of acute infarction, hemorrhage, hydrocephalus, extra-axial collection or mass lesion/mass  effect. Extensive periventricular and deep white matter hypodensity. Vascular: No hyperdense vessel or unexpected calcification. Skull: Normal. Negative for fracture or focal lesion. Sinuses/Orbits: No acute finding. Other: None. CT CERVICAL SPINE FINDINGS Alignment: Normal. Skull base and vertebrae: No acute fracture. No primary bone lesion or focal pathologic process. Soft tissues and spinal canal: No prevertebral fluid or swelling. No visible canal hematoma. Disc levels: Moderate multilevel disc space height loss and osteophytosis, worst at the lower cervical levels. Upper chest: Negative. Other: None. IMPRESSION: 1. No acute intracranial pathology. Small-vessel white matter disease. 2. No fracture or static subluxation of the cervical spine. 3. Moderate multilevel cervical disc degenerative disease. Electronically Signed   By: Jearld Lesch M.D.   On: 06/05/2023 15:23    Assessment/Plan: UTI (urinary tract infection) ED eval 06/05/23 for fall, CT head/cervical spine, thoracic spine showed no acute fracture, she was found to have UTI even if the patient stated she is asymptomatic, urine culture: E coli >100,000c/ml, 7 day course of Keflex started, tolerated well.   Fall ED eval 06/05/23 for fall, CT head/cervical spine, thoracic spine showed no acute fracture, she was found to have UTI even if the patient stated she is asymptomatic, urine culture: E coli >100,000c/ml, 7 day course of Keflex started, tolerated well.  Therapy to eval and tx.   OSA (obstructive sleep apnea)  CPAP, seeing pulmonology.   Atrial flutter (HCC)  A-flutter 01/05/22, no recurrence, delay cardiology  Osteopenia after menopause declined Alendronate after initial consented.   Osteoarthritis, multiple sites Back pain mid to lower portion, better, X-ray T/L spine showed multilevel degenerative changes, no acute findings CT head/C+T+L spine, and pelvis,  taking Tylenol, Tramadol  Anxiety , Lorazepam prn available to her.    Elevated TSH      Elevated TSH 5.07 06/11/22, TSH 5.11 02/10/23  HTN (hypertension) dc'd Losartan by Dr. Maple Hudson 04/02/23? The patient is still taking Losartan 100mg  qd,  continue Metoprolol,  takes ASA, Bun/creat 20/0.84 06/05/23  GERD (gastroesophageal reflux disease) stable on Omeprazole, Hgb 11.8 06/05/23  Hyperlipidemia off Atorvastatin, LDL 70 06/11/22  Prediabetes diet controlled, Hgb a1c 5.7 06/11/22    Family/ staff Communication: plan of care reviewed with the patient and charge nurse.   Labs/tests ordered:  none  Time spend 40 minutes.

## 2023-06-09 NOTE — Assessment & Plan Note (Signed)
diet controlled, Hgb a1c 5.7 06/11/22 

## 2023-06-09 NOTE — Assessment & Plan Note (Signed)
dc'd Losartan by Dr. Maple Hudson 04/02/23? The patient is still taking Losartan 100mg  qd,  continue Metoprolol,  takes ASA, Bun/creat 20/0.84 06/05/23

## 2023-06-09 NOTE — Assessment & Plan Note (Signed)
stable on Omeprazole, Hgb 11.8 06/05/23

## 2023-06-10 ENCOUNTER — Encounter: Payer: Self-pay | Admitting: Nurse Practitioner

## 2023-06-10 DIAGNOSIS — M6281 Muscle weakness (generalized): Secondary | ICD-10-CM | POA: Diagnosis not present

## 2023-06-10 DIAGNOSIS — R2681 Unsteadiness on feet: Secondary | ICD-10-CM | POA: Diagnosis not present

## 2023-06-10 DIAGNOSIS — N3946 Mixed incontinence: Secondary | ICD-10-CM | POA: Diagnosis not present

## 2023-06-10 DIAGNOSIS — R29898 Other symptoms and signs involving the musculoskeletal system: Secondary | ICD-10-CM | POA: Diagnosis not present

## 2023-06-10 DIAGNOSIS — R41841 Cognitive communication deficit: Secondary | ICD-10-CM | POA: Diagnosis not present

## 2023-06-11 ENCOUNTER — Encounter: Payer: PPO | Admitting: Nurse Practitioner

## 2023-06-29 DIAGNOSIS — R29898 Other symptoms and signs involving the musculoskeletal system: Secondary | ICD-10-CM | POA: Diagnosis not present

## 2023-06-29 DIAGNOSIS — R2681 Unsteadiness on feet: Secondary | ICD-10-CM | POA: Diagnosis not present

## 2023-06-29 DIAGNOSIS — M6281 Muscle weakness (generalized): Secondary | ICD-10-CM | POA: Diagnosis not present

## 2023-06-29 DIAGNOSIS — N3946 Mixed incontinence: Secondary | ICD-10-CM | POA: Diagnosis not present

## 2023-07-01 ENCOUNTER — Telehealth: Payer: Self-pay | Admitting: Internal Medicine

## 2023-07-01 NOTE — Telephone Encounter (Signed)
Pt. Called and got rescheduled due to Dr. Not in office on 7.9 now her apt is till Aug.18th but she really wanted to see Dr. Maple Hudson sooner I told her I would ask her nurse and see if he could see her sooner on a block spot for having to reschedule her please advise

## 2023-07-03 NOTE — Telephone Encounter (Signed)
Spoke to pt, son gave pt, sooner apt

## 2023-07-03 NOTE — Telephone Encounter (Signed)
Ok to use a held spot 

## 2023-07-03 NOTE — Telephone Encounter (Signed)
Dr. Young are you okay with me using a held spot? 

## 2023-07-03 NOTE — Telephone Encounter (Signed)
ATC X1 LVM for patient to call the office back 

## 2023-07-05 NOTE — Progress Notes (Signed)
04/02/23- 89 yoF never smoker for sleep courtesy of Man Mast, NP with concern of OSA, bringing CPAP. Medical problem list includes OSA, Insomnia,  HTN, AFlutter, Lung nodule<46mm, GERD, HOH, Obesity, Anxiety, Allergic Rhinitis,  NPSG 01/07/23-  AHI 50.7/ hr, desaturation to 80%, body weight 171 lbs Epworth score- CPAP       auto 5-12/ AirSense 10      range 11.3-11.8 Download compliance   77%. AHI 12.2/ hr           Body weight today-169 lbs -----Patient has two cpap machines, been using a cpap machine for 10 years. Had sleep study in January Moderately high leak.  Download reviewed.  She feels she sleeps better with CPAP.  Current machine is at least 87 years old and she says she is getting a message that motor life is exceeded.  Living at [facility] now, widowed.  She has lived in this area 6 or 7 years after moving here from [place] where her original sleep study was done. She also expresses concern that she has a lung nodule.  This was noted on CT scan of the neck in January after a fall.  We can do a chest x-ray while here.  She denies cough or breathing discomfort and denies cardiac problems.  I note history of atrial flutter. CXR 01/05/22 IMPRESSION: 1. Small bilateral pleural effusions with bibasilar atelectasis. 2. No rib fracture.  07/06/23- 89 yoF never smoker followed for OSA, Lung Nodule, complicated by  Insomnia,  HTN, AFlutter, Lung nodule<56mm, GERD, HOH, Obesity, Anxiety, Allergic Rhinitis,                    Son Molly Maduro here CPAP       auto 5-12/ [employer]- replacement ordered 87/4/24 Download compliance-   70%, AHI 11.2/ hr     (pressure range 11.5-11.7)      Body weight today-160 lbs -----CPAP f/u, pt is still using old cpap machine  Download reviewed. Has 2 CPAP machines. Denies any respiratory problems.  CT chest 04/28/23 for lung nodule- MPRESSION: 1. No acute findings in the chest. 2. 6 mm right upper lobe pulmonary nodule identified on previous CT cervical spine is 5 mm  today. CT at 18-24 months (from today's scan) is considered optional for low-risk patients, but is recommended for high-risk patients. This recommendation follows the consensus statement: Guidelines for Management of Incidental Pulmonary Nodules Detected on CT Images: From the Fleischner Society 2017; Radiology 2017; 284:228-243. 3. Large hiatal hernia. 4.  Aortic Atherosclerosis (ICD10-I70.0).  ROS-see HPI   + = positive Constitutional:    weight loss, night sweats, fevers, chills, fatigue, lassitude. HEENT:    headaches, difficulty swallowing, tooth/dental problems, sore throat,       sneezing, itching, ear ache, nasal congestion, post nasal drip, snoring CV:    chest pain, orthopnea, PND, swelling in lower extremities, anasarca,                                   dizziness, palpitations Resp:   shortness of breath with exertion or at rest.                productive cough,   non-productive cough, coughing up of blood.              change in color of mucus.  wheezing.   Skin:    rash or lesions. GI:  No-  heartburn, indigestion, abdominal pain, nausea, vomiting, diarrhea,                 change in bowel habits, loss of appetite GU: dysuria, change in color of urine, no urgency or frequency.   flank pain. MS:   joint pain, stiffness, decreased range of motion, back pain. Neuro-     nothing unusual Psych:  change in mood or affect.  +depression or +anxiety.   memory loss.  OBJ- Physical Exam General- Alert, Oriented, Affect-appropriate, Distress- none acute Skin- rash-none, lesions- none, excoriation- none Lymphadenopathy- none Head- atraumatic            Eyes- Gross vision intact, PERRLA, conjunctivae and secretions clear            Ears- Hearing, canals-normal            Nose- Clear, no-Septal dev, mucus, polyps, erosion, perforation             Throat- Mallampati III-IV , mucosa clear , drainage- none, tonsils- atrophic, +teeth Neck- flexible , trachea midline, no stridor ,  thyroid nl, carotid no bruit Chest - +coarse crackles, unlaboredsymmetrical excursion , unlabored           Heart/CV- RRR ,  murmur+ 2/6 S , no gallop  , no rub, nl s1 s2                           - JVD- none , edema- none, stasis changes- none, varices- none           Lung- +basilar crackles, wheeze- none, cough- none , dullness-none, rub- none           Chest wall-  Abd-  Br/ Gen/ Rectal- Not done, not indicated Extrem- cyanosis- none, clubbing, none, atrophy- none, strength- nl Neuro- grossly intact to observation

## 2023-07-06 ENCOUNTER — Encounter: Payer: Self-pay | Admitting: Internal Medicine

## 2023-07-06 ENCOUNTER — Ambulatory Visit: Payer: PPO | Admitting: Internal Medicine

## 2023-07-06 VITALS — BP 130/78 | HR 71 | Ht 62.0 in | Wt 160.0 lb

## 2023-07-06 DIAGNOSIS — G4733 Obstructive sleep apnea (adult) (pediatric): Secondary | ICD-10-CM

## 2023-07-06 DIAGNOSIS — F5105 Insomnia due to other mental disorder: Secondary | ICD-10-CM

## 2023-07-06 DIAGNOSIS — F419 Anxiety disorder, unspecified: Secondary | ICD-10-CM | POA: Diagnosis not present

## 2023-07-06 NOTE — Patient Instructions (Signed)
You can replace your old CPAP machine and get replacement supplies either through the durable medical equipment company [employer], or by using an on-line internet company such as [email or web address] You can discuss this with your son and see which he thinks would be best. If you decide to go with [employer]- please let us know that is what you want to do. We will send them an order.  If you decide to go with an on-line company, I will need to generate a printed prescription that lists the machine and specific supplies (masks, hoses and etc.)

## 2023-07-07 ENCOUNTER — Ambulatory Visit: Payer: PPO | Admitting: Internal Medicine

## 2023-07-07 ENCOUNTER — Telehealth: Payer: Self-pay | Admitting: Internal Medicine

## 2023-07-07 NOTE — Telephone Encounter (Signed)
Pt calling in bc she is wants to know the next steps for her CPAP

## 2023-07-09 NOTE — Telephone Encounter (Signed)
MyChart message sent with next steps nothing further needed.

## 2023-07-13 ENCOUNTER — Telehealth: Payer: Self-pay

## 2023-07-13 ENCOUNTER — Other Ambulatory Visit: Payer: Self-pay

## 2023-07-13 DIAGNOSIS — G4733 Obstructive sleep apnea (adult) (pediatric): Secondary | ICD-10-CM

## 2023-07-13 NOTE — Telephone Encounter (Signed)
New order for cpap placed. Pt would like to stick with DME: [employer]. Nothing further needed.

## 2023-07-14 DIAGNOSIS — B351 Tinea unguium: Secondary | ICD-10-CM | POA: Diagnosis not present

## 2023-07-14 DIAGNOSIS — M79672 Pain in left foot: Secondary | ICD-10-CM | POA: Diagnosis not present

## 2023-07-14 DIAGNOSIS — L84 Corns and callosities: Secondary | ICD-10-CM | POA: Diagnosis not present

## 2023-07-14 DIAGNOSIS — M79671 Pain in right foot: Secondary | ICD-10-CM | POA: Diagnosis not present

## 2023-07-23 ENCOUNTER — Encounter: Payer: PPO | Admitting: Nurse Practitioner

## 2023-07-31 DIAGNOSIS — N3946 Mixed incontinence: Secondary | ICD-10-CM | POA: Diagnosis not present

## 2023-07-31 DIAGNOSIS — R2681 Unsteadiness on feet: Secondary | ICD-10-CM | POA: Diagnosis not present

## 2023-07-31 DIAGNOSIS — M6281 Muscle weakness (generalized): Secondary | ICD-10-CM | POA: Diagnosis not present

## 2023-07-31 DIAGNOSIS — R29898 Other symptoms and signs involving the musculoskeletal system: Secondary | ICD-10-CM | POA: Diagnosis not present

## 2023-08-04 DIAGNOSIS — G4733 Obstructive sleep apnea (adult) (pediatric): Secondary | ICD-10-CM | POA: Diagnosis not present

## 2023-08-09 ENCOUNTER — Encounter: Payer: Self-pay | Admitting: Internal Medicine

## 2023-08-09 NOTE — Assessment & Plan Note (Signed)
Benefits from CPAP with good compliance and control. Plan- continue auto 5-12 

## 2023-08-09 NOTE — Assessment & Plan Note (Signed)
Discussed sleep hygiene

## 2023-08-10 ENCOUNTER — Encounter: Payer: Self-pay | Admitting: Nurse Practitioner

## 2023-08-10 ENCOUNTER — Non-Acute Institutional Stay: Payer: PPO | Admitting: Nurse Practitioner

## 2023-08-10 DIAGNOSIS — K219 Gastro-esophageal reflux disease without esophagitis: Secondary | ICD-10-CM

## 2023-08-10 DIAGNOSIS — G4733 Obstructive sleep apnea (adult) (pediatric): Secondary | ICD-10-CM

## 2023-08-10 DIAGNOSIS — U071 COVID-19: Secondary | ICD-10-CM | POA: Diagnosis not present

## 2023-08-10 DIAGNOSIS — Z780 Asymptomatic menopausal state: Secondary | ICD-10-CM

## 2023-08-10 DIAGNOSIS — M8580 Other specified disorders of bone density and structure, unspecified site: Secondary | ICD-10-CM | POA: Diagnosis not present

## 2023-08-10 DIAGNOSIS — R7989 Other specified abnormal findings of blood chemistry: Secondary | ICD-10-CM | POA: Diagnosis not present

## 2023-08-10 DIAGNOSIS — F419 Anxiety disorder, unspecified: Secondary | ICD-10-CM | POA: Diagnosis not present

## 2023-08-10 DIAGNOSIS — I4892 Unspecified atrial flutter: Secondary | ICD-10-CM

## 2023-08-10 DIAGNOSIS — R7303 Prediabetes: Secondary | ICD-10-CM | POA: Diagnosis not present

## 2023-08-10 DIAGNOSIS — I10 Essential (primary) hypertension: Secondary | ICD-10-CM

## 2023-08-10 NOTE — Assessment & Plan Note (Signed)
declined Alendronate after initial consented.  

## 2023-08-10 NOTE — Assessment & Plan Note (Signed)
CPAP, seeing pulmonology.

## 2023-08-10 NOTE — Assessment & Plan Note (Signed)
stable on Omeprazole, Hgb 11.8 06/05/23

## 2023-08-10 NOTE — Assessment & Plan Note (Signed)
diet controlled, Hgb a1c 5.7 06/11/22, update Hgb a1c

## 2023-08-10 NOTE — Progress Notes (Unsigned)
Location:   [facility] Room Number: 820 Place of Service:  ALF (13) Provider: Arna Snipe Jamylah Marinaccio NP  Porschia Willbanks X, NP  Patient Care Team: Laryn Venning X, NP as PCP - General (Internal Medicine) Lekeith Wulf X, NP as Nurse Practitioner (Internal Medicine)  Extended Emergency Contact Information Primary Emergency Contact: [facility] Address: [address]          [place], [place] [address] Darden Amber of [place] Home Phone: [phone] Mobile Phone: [phone] Relation: Son  Code Status: DNR Goals of care: Advanced Directive information    06/05/2023    7:03 PM  Advanced Directives  Does Patient Have a Medical Advance Directive? Yes  Type of Advance Directive Living will;Healthcare Power of Attorney     Chief Complaint  Patient presents with  . Acute Visit    Fever, nasal congestion, tested positive COVID    HPI:  Pt is a 89 y.o. female seen today for an acute visit for fever, nasal congestion, tested positive COVID. Denied cough, SOB, or chest pain.    ED eval 06/05/23 for fall, CT head/cervical spine, thoracic spine showed no acute fracture, she was found to have UTI even if the patient stated she is asymptomatic, urine culture: E coli >100,000c/ml, 7 day course of Keflex             ED eval 01/16/23 near syncope, fall, EKG, CT head/cervical spine, X-ray R knee, CBC, CMP unremarkable.             CT head/cervical spine, incidental finding: 6 mm right solid pulmonary nodule within the upper lobe. Per Fleischner Society Guidelines, recommend a non-contrast Chest CT at 6-12 months-ordered. pending CT chest wo CM. CXR 04/02/23 no acute process.              Edema BLE, not apparent, TED, Bun/creat 20/0.84             OSA, CPAP, seeing pulmonology.              A-flutter 01/05/22, no recurrence, delay cardiology OP declined Alendronate after initial consented.  Back pain mid to lower portion, better, X-ray T/L spine showed multilevel degenerative changes, no acute findings CT head/C+T+L  spine, and pelvis,  taking Tylenol, Tramadol             Bone spur R foot, callus of foot, f/u podiatrist.              Allergic rhinitis, off Fluticasone nasal spray, Claritin, Atrovent nasal spray.              Anxiety, Lorazepam prn available to her.              Elevated TSH 5.07 06/11/22, TSH 5.11 02/10/23, not on supplement.              HTN, dc'd Losartan by Dr. Maple Hudson 04/02/23? The patient is still taking Losartan 100mg  qd,  continue Metoprolol,  takes ASA, Bun/creat 20/0.84 06/05/23             GERD, stable on Omeprazole, Hgb 11.8 06/05/23             Hyperlipidemia, off Atorvastatin, LDL 70 06/11/22             Prediabetes, diet controlled, Hgb a1c 5.7 06/11/22                  Past Medical History:  Diagnosis Date  . GERD (gastroesophageal reflux disease)   . Hx of cataract  surgery    both eyes  . Hyperglycemia   . Hyperlipidemia   . Hypertension   . Lumbago 04/05/2016  . Macular degeneration    ? which eye(s)  . Obstructive sleep apnea   . Osteoporosis, senile   . Overactive bladder   . Squamous cell skin cancer 2/16   left lower leg   Past Surgical History:  Procedure Laterality Date  . ABDOMINAL HYSTERECTOMY  1960's  . APPENDECTOMY  1960's  . MOLE REMOVAL  2000   facial  . TONSILLECTOMY      Allergies  Allergen Reactions  . Adhesive [Tape]   . Ampicillin   . Elemental Sulfur   . Nitrofuran Derivatives   . Zoloft [Sertraline Hcl] Other (See Comments)    Dizziness, nausea, faint feeling, and nightmares      Allergies as of 08/10/2023       Reactions   Adhesive [tape]    Ampicillin    Elemental Sulfur    Nitrofuran Derivatives    Zoloft [sertraline Hcl] Other (See Comments)   Dizziness, nausea, faint feeling, and nightmares          Medication List        Accurate as of August 10, 2023 11:59 PM. If you have any questions, ask your nurse or doctor.          acetaminophen 500 MG tablet Commonly known as: TYLENOL Take 500 mg by mouth daily.    aspirin EC 81 MG tablet Take 1 tablet (81 mg total) by mouth daily.   Calcium Carb-Cholecalciferol 600-800 MG-UNIT Tabs Take 1 tablet by mouth 2 (two) times daily.   LORazepam 0.5 MG tablet Commonly known as: ATIVAN TAKE ONE TABLET ONCE DAILY AS NEEDED FOR ANXIETY.   losartan 100 MG tablet Commonly known as: COZAAR Take 100 mg by mouth daily.   metoprolol succinate 25 MG 24 hr tablet Commonly known as: TOPROL-XL TAKE 1/2 TABLET ONCE A DAY.   Ocuvite Adult 50+ Caps Take 1 capsule by mouth every evening.   omeprazole 40 MG capsule Commonly known as: PRILOSEC TAKE 1 CAPSULE DAILY 1/2 HOUR BEFORE BREAKFAST FOR STOMACH ACID.   Systane 0.4-0.3 % Gel ophthalmic gel Generic drug: Polyethyl Glycol-Propyl Glycol Place 1 application into both eyes daily.   traMADol 50 MG tablet Commonly known as: ULTRAM Take 1 tablet (50 mg total) by mouth every 6 (six) hours as needed.   TRIAMCINOLONE ACETONIDE(NASAL) NA Place 2 sprays into the nose. Each nostril        Review of Systems  Constitutional:  Positive for appetite change, fatigue and fever.  HENT:  Positive for congestion. Negative for hearing loss, trouble swallowing and voice change.        Improved.   Eyes:  Negative for visual disturbance.  Respiratory:  Negative for cough and shortness of breath.   Cardiovascular:  Negative for leg swelling.  Gastrointestinal:  Negative for abdominal pain and constipation.       X1-2 times in the past 3 months, diet adjustment helped.   Genitourinary:  Negative for dysuria and urgency.       0-1x/night urination, occasionally urinary leakage.   Musculoskeletal:  Positive for arthralgias, back pain and gait problem.  Skin:  Negative for color change.  Neurological:  Negative for dizziness, speech difficulty, weakness and light-headedness.       Tingling in toes sometimes, better after moves around.   Psychiatric/Behavioral:  Negative for confusion and sleep disturbance. The patient is  not nervous/anxious.  Immunization History  Administered Date(s) Administered  . Fluad Quad(high Dose 65+) 10/12/2019, 10/14/2022  . Influenza Whole 09/30/2018  . Influenza, High Dose Seasonal PF 10/07/2017, 09/28/2021  . Influenza-Unspecified 09/27/2015, 10/09/2016, 10/10/2020  . Moderna Covid-19 Vaccine Bivalent Booster 17yrs & up 10/30/2022  . Moderna Sars-Covid-2 Vaccination 01/02/2020, 01/30/2020, 11/06/2020  . Pfizer Fall 2023 Covid-19 Vaccine 6mos thru 48yrs  05/16/2022  . Pneumococcal Conjugate-13 01/23/2015  . Pneumococcal Polysaccharide-23 10/20/2007  . Respiratory Syncytial Virus Vaccine,Recomb Aduvanted(Arexvy) 12/26/2022  . Td 12/14/2012   Pertinent  Health Maintenance Due  Topic Date Due  . INFLUENZA VACCINE  07/30/2023  . DEXA SCAN  Completed      12/04/2022    9:48 AM 03/05/2023    2:48 PM 03/12/2023    3:32 PM 04/23/2023    1:49 PM 05/20/2023    2:12 PM  Fall Risk  Falls in the past year? 0 0 0 0 0  Was there an injury with Fall? 0 0 0 0 0  Fall Risk Category Calculator 0 0 0 0 0  Fall Risk Category (Retired) Low      (RETIRED) Patient Fall Risk Level Low fall risk      Patient at Risk for Falls Due to No Fall Risks No Fall Risks Impaired balance/gait;Impaired mobility History of fall(s) History of fall(s)  Fall risk Follow up Falls evaluation completed Falls evaluation completed Falls evaluation completed Falls evaluation completed Falls evaluation completed   Functional Status Survey:    Vitals:   08/10/23 1431  BP: (!) 150/60  Pulse: 90  Resp: (!) 22  Temp: 100 F (37.8 C)  SpO2: 91%  Weight: 157 lb (71.2 kg)   Body mass index is 28.72 kg/m. Physical Exam Vitals and nursing note reviewed.  Constitutional:      Comments: fatigue  HENT:     Head: Normocephalic and atraumatic.     Mouth/Throat:     Mouth: Mucous membranes are moist.  Eyes:     Extraocular Movements: Extraocular movements intact.     Conjunctiva/sclera: Conjunctivae normal.      Pupils: Pupils are equal, round, and reactive to light.  Cardiovascular:     Rate and Rhythm: Normal rate and regular rhythm.     Heart sounds: No murmur heard. Pulmonary:     Effort: Pulmonary effort is normal.     Breath sounds: Normal breath sounds. No rales.  Abdominal:     General: Bowel sounds are normal.     Palpations: Abdomen is soft.     Tenderness: There is no abdominal tenderness.  Musculoskeletal:     Cervical back: Normal range of motion and neck supple.     Right lower leg: No edema.     Left lower leg: No edema.  Skin:    General: Skin is warm and dry.  Neurological:     General: No focal deficit present.     Mental Status: She is alert and oriented to person, place, and time. Mental status is at baseline.     Gait: Gait abnormal.  Psychiatric:        Mood and Affect: Mood normal.        Behavior: Behavior normal.        Thought Content: Thought content normal.        Judgment: Judgment normal.    Labs reviewed: Recent Labs    01/16/23 1941 02/17/23 1105 06/05/23 1548  NA 139 142 144  K 3.1* 4.9 4.5  CL 105 107 109  CO2 24 25 25   GLUCOSE 105* 117* 92  BUN 29* 31* 20  CREATININE 0.88 1.06* 0.84  CALCIUM 9.5 9.9 10.0   Recent Labs    01/16/23 1941 02/17/23 1105 06/05/23 1548  AST 26 20 35  ALT 17 11 22   ALKPHOS 60  --  61  BILITOT 0.2* 0.5 0.9  PROT 6.1* 5.9* 5.8*  ALBUMIN 3.7  --  3.7   Recent Labs    01/16/23 1941 06/05/23 1548  WBC 6.4 10.5  NEUTROABS 3.8 8.2*  HGB 11.5* 11.8*  HCT 35.9* 37.0  MCV 95.7 93.9  PLT 179 174   Lab Results  Component Value Date   TSH 5.11 (H) 02/10/2023   Lab Results  Component Value Date   HGBA1C 5.7 (H) 06/11/2022   Lab Results  Component Value Date   CHOL 139 06/11/2022   HDL 56 06/11/2022   LDLCALC 70 06/11/2022   TRIG 57 06/11/2022   CHOLHDL 2.5 06/11/2022    Significant Diagnostic Results in last 30 days:  No results found.  Assessment/Plan: COVID-19 virus infection fever,  nasal congestion x 1 day, tested positive COVID. Denied cough, SOB, or chest pain. Will treat with Paxlovid 300/100mg  bid x 5 days, update CBC/diff, CMP/eGFR  OSA (obstructive sleep apnea) CPAP, seeing pulmonology.   Atrial flutter (HCC)  01/05/22, no recurrence, delay cardiology  Osteopenia after menopause  declined Alendronate after initial consented.   Anxiety Lorazepam prn available to her.   Elevated TSH   Elevated TSH 5.07 06/11/22, TSH 5.11 02/10/23, not on supplement. Update TSH  HTN (hypertension) dc'd Losartan by Dr. Maple Hudson 04/02/23? The patient is still taking Losartan 100mg  qd,  continue Metoprolol,  takes ASA, Bun/creat 20/0.84 06/05/23  GERD (gastroesophageal reflux disease) stable on Omeprazole, Hgb 11.8 06/05/23  Prediabetes diet controlled, Hgb a1c 5.7 06/11/22, update Hgb a1c    Family/ staff Communication: plan of care reviewed with the patient and charge nurse.   Labs/tests ordered:  none  Time spend 40 minutes.

## 2023-08-10 NOTE — Assessment & Plan Note (Signed)
dc'd Losartan by Dr. Maple Hudson 04/02/23? The patient is still taking Losartan 100mg  qd,  continue Metoprolol,  takes ASA, Bun/creat 20/0.84 06/05/23

## 2023-08-10 NOTE — Assessment & Plan Note (Signed)
Lorazepam prn available to her.  

## 2023-08-10 NOTE — Assessment & Plan Note (Signed)
01/05/22, no recurrence, delay cardiology

## 2023-08-10 NOTE — Assessment & Plan Note (Signed)
fever, nasal congestion x 1 day, tested positive COVID. Denied cough, SOB, or chest pain. Will treat with Paxlovid 300/100mg  bid x 5 days, update CBC/diff, CMP/eGFR

## 2023-08-10 NOTE — Assessment & Plan Note (Addendum)
Elevated TSH 5.07 06/11/22, TSH 5.11 02/10/23, not on supplement. Update TSH

## 2023-08-11 DIAGNOSIS — I10 Essential (primary) hypertension: Secondary | ICD-10-CM | POA: Diagnosis not present

## 2023-08-11 DIAGNOSIS — R7303 Prediabetes: Secondary | ICD-10-CM | POA: Diagnosis not present

## 2023-08-11 LAB — HEPATIC FUNCTION PANEL
ALT: 14 U/L (ref 7–35)
AST: 23 (ref 13–35)
Alkaline Phosphatase: 56 (ref 25–125)
Bilirubin, Total: 0.3

## 2023-08-11 LAB — BASIC METABOLIC PANEL
BUN: 29 — AB (ref 4–21)
CO2: 28 — AB (ref 13–22)
Chloride: 106 (ref 99–108)
Creatinine: 1 (ref 0.5–1.1)
Glucose: 89
Potassium: 4.2 mEq/L (ref 3.5–5.1)
Sodium: 141 (ref 137–147)

## 2023-08-11 LAB — CBC AND DIFFERENTIAL
HCT: 32 — AB (ref 36–46)
Hemoglobin: 10.5 — AB (ref 12.0–16.0)
Neutrophils Absolute: 1915
Platelets: 118 10*3/uL — AB (ref 150–400)
WBC: 3.8

## 2023-08-11 LAB — COMPREHENSIVE METABOLIC PANEL
Albumin: 3.4 — AB (ref 3.5–5.0)
Calcium: 8.7 (ref 8.7–10.7)
Globulin: 1.7
eGFR: 54

## 2023-08-11 LAB — CBC: RBC: 3.54 — AB (ref 3.87–5.11)

## 2023-08-11 LAB — HEMOGLOBIN A1C: Hemoglobin A1C: 5.8

## 2023-08-11 LAB — TSH: TSH: 3.49 (ref 0.41–5.90)

## 2023-08-14 ENCOUNTER — Non-Acute Institutional Stay: Payer: PPO | Admitting: Nurse Practitioner

## 2023-08-14 ENCOUNTER — Encounter: Payer: Self-pay | Admitting: Nurse Practitioner

## 2023-08-14 ENCOUNTER — Ambulatory Visit: Payer: PPO | Admitting: Internal Medicine

## 2023-08-14 DIAGNOSIS — U071 COVID-19: Secondary | ICD-10-CM | POA: Diagnosis not present

## 2023-08-14 DIAGNOSIS — I10 Essential (primary) hypertension: Secondary | ICD-10-CM | POA: Diagnosis not present

## 2023-08-14 DIAGNOSIS — J309 Allergic rhinitis, unspecified: Secondary | ICD-10-CM | POA: Diagnosis not present

## 2023-08-14 DIAGNOSIS — F419 Anxiety disorder, unspecified: Secondary | ICD-10-CM | POA: Diagnosis not present

## 2023-08-14 DIAGNOSIS — R7989 Other specified abnormal findings of blood chemistry: Secondary | ICD-10-CM | POA: Diagnosis not present

## 2023-08-14 DIAGNOSIS — I4892 Unspecified atrial flutter: Secondary | ICD-10-CM

## 2023-08-14 NOTE — Assessment & Plan Note (Signed)
Blood pressure is controlled,  dc'd Losartan by Dr. Maple Hudson 04/02/23? The patient is still taking Losartan 100mg  qd,  continue Metoprolol,  takes ASA, Bun/creat 29/1.0 08/11/23

## 2023-08-14 NOTE — Assessment & Plan Note (Signed)
Lorazepam prn available to her.  

## 2023-08-14 NOTE — Assessment & Plan Note (Signed)
01/05/22, no recurrence, delay cardiology

## 2023-08-14 NOTE — Progress Notes (Signed)
Location:   [facility] Room Number: 820A Place of Service:  ALF (13) Provider: Arna Snipe Donte Kary NP  Alixis Harmon X, NP  Patient Care Team: Conor Filsaime X, NP as PCP - General (Internal Medicine) Veeda Virgo X, NP as Nurse Practitioner (Internal Medicine)  Extended Emergency Contact Information Primary Emergency Contact: [facility] Address: [address]          [place], [place] [address] D[place] of [place] Home Phone: [phone] Mobile Phone: [phone] Relation: Son  Code Status: DNR Goals of care: Advanced Directive information    08/14/2023   12:42 PM  Advanced Directives  Does Patient Have a Medical Advance Directive? Yes  Type of Advance Directive Living will  Does patient want to make changes to medical advance directive? No - Patient declined     Chief Complaint  Patient presents with  . Acute Visit    Patient is following up from covid   . Immunizations    Patient is due for tdap, shingles and covid vaccine    HPI:  Pt is a 89 y.o. female seen today for an acute visit for f/u treated COVID  COVID, asymptomatic today.               ED eval 06/05/23 for fall, CT head/cervical spine, thoracic spine showed no acute fracture, she was found to have UTI even if the patient stated she is asymptomatic, urine culture: E coli >100,000c/ml, 7 day course of Keflex             ED eval 01/16/23 near syncope, fall, EKG, CT head/cervical spine, X-ray R knee, CBC, CMP unremarkable.             CT head/cervical spine, incidental finding: 6 mm right solid pulmonary nodule within the upper lobe. Per Fleischner Society Guidelines, recommend a non-contrast Chest CT at 6-12 months-ordered. pending CT chest wo CM. CXR 04/02/23 no acute process.              Edema BLE, not apparent, TED, Bun/creat 20/0.84             OSA, CPAP, seeing pulmonology.              A-flutter 01/05/22, no recurrence, delay cardiology OP declined Alendronate after initial consented.  Back pain mid to lower  portion, better, X-ray T/L spine showed multilevel degenerative changes, no acute findings CT head/C+T+L spine, and pelvis,  taking Tylenol, Tramadol             Bone spur R foot, callus of foot, f/u podiatrist.              Allergic rhinitis, off Fluticasone nasal spray, Claritin, Atrovent nasal spray.              Anxiety, Lorazepam prn available to her.              Elevated TSH 5.07 06/11/22, TSH 5.11 02/10/23, TSH 3.49 08/11/23,  not on supplement.             HTN, dc'd Losartan by Dr. Maple Hudson 04/02/23? The patient is still taking Losartan 100mg  qd,  continue Metoprolol,  takes ASA, Bun/creat 29/1.0 08/11/23             GERD, stable on Omeprazole, Hgb 10.5 08/11/23             Hyperlipidemia, off Atorvastatin, LDL 70 06/11/22             Prediabetes, diet controlled, Hgb a1c 5.7  06/11/22  Past Medical History:  Diagnosis Date  . GERD (gastroesophageal reflux disease)   . Hx of cataract surgery    both eyes  . Hyperglycemia   . Hyperlipidemia   . Hypertension   . Lumbago 04/05/2016  . Macular degeneration    ? which eye(s)  . Obstructive sleep apnea   . Osteoporosis, senile   . Overactive bladder   . Squamous cell skin cancer 2/16   left lower leg   Past Surgical History:  Procedure Laterality Date  . ABDOMINAL HYSTERECTOMY  1960's  . APPENDECTOMY  1960's  . MOLE REMOVAL  2000   facial  . TONSILLECTOMY      Allergies  Allergen Reactions  . Adhesive [Tape]   . Ampicillin   . Elemental Sulfur   . Nitrofuran Derivatives   . Zoloft [Sertraline Hcl] Other (See Comments)    Dizziness, nausea, faint feeling, and nightmares      Allergies as of 08/14/2023       Reactions   Adhesive [tape]    Ampicillin    Elemental Sulfur    Nitrofuran Derivatives    Zoloft [sertraline Hcl] Other (See Comments)   Dizziness, nausea, faint feeling, and nightmares          Medication List        Accurate as of August 14, 2023  4:08 PM. If you have any questions, ask your nurse or doctor.           acetaminophen 500 MG tablet Commonly known as: TYLENOL Take 500 mg by mouth daily.   aspirin EC 81 MG tablet Take 1 tablet (81 mg total) by mouth daily.   Calcium Carb-Cholecalciferol 600-800 MG-UNIT Tabs Take 1 tablet by mouth 2 (two) times daily.   LORazepam 0.5 MG tablet Commonly known as: ATIVAN TAKE ONE TABLET ONCE DAILY AS NEEDED FOR ANXIETY.   losartan 100 MG tablet Commonly known as: COZAAR Take 100 mg by mouth daily.   metoprolol succinate 25 MG 24 hr tablet Commonly known as: TOPROL-XL TAKE 1/2 TABLET ONCE A DAY.   Ocuvite Adult 50+ Caps Take 1 capsule by mouth every evening.   omeprazole 40 MG capsule Commonly known as: PRILOSEC TAKE 1 CAPSULE DAILY 1/2 HOUR BEFORE BREAKFAST FOR STOMACH ACID.   Paxlovid (300/100) 20 x 150 MG & 10 x 100MG  Tbpk Generic drug: nirmatrelvir & ritonavir Take by mouth.   Systane 0.4-0.3 % Gel ophthalmic gel Generic drug: Polyethyl Glycol-Propyl Glycol Place 1 application into both eyes daily.   traMADol 50 MG tablet Commonly known as: ULTRAM Take 1 tablet (50 mg total) by mouth every 6 (six) hours as needed.   TRIAMCINOLONE ACETONIDE(NASAL) NA Place 2 sprays into the nose. Each nostril        Review of Systems  Constitutional:  Negative for appetite change, fatigue and fever.  HENT:  Negative for congestion, hearing loss, trouble swallowing and voice change.   Eyes:  Negative for visual disturbance.  Respiratory:  Negative for cough and shortness of breath.   Cardiovascular:  Negative for leg swelling.  Gastrointestinal:  Negative for abdominal pain and constipation.       X1-2 times in the past 3 months, diet adjustment helped.   Genitourinary:  Negative for dysuria and urgency.       0-1x/night urination, occasionally urinary leakage.   Musculoskeletal:  Positive for arthralgias, back pain and gait problem.  Skin:  Negative for color change.  Neurological:  Negative for speech difficulty, weakness and  light-headedness.       Tingling in toes sometimes, better after moves around.   Psychiatric/Behavioral:  Negative for confusion and sleep disturbance. The patient is not nervous/anxious.     Immunization History  Administered Date(s) Administered  . Fluad Quad(high Dose 65+) 10/12/2019, 10/14/2022  . Influenza Whole 09/30/2018  . Influenza, High Dose Seasonal PF 10/07/2017, 09/28/2021  . Influenza-Unspecified 09/27/2015, 10/09/2016, 10/10/2020  . Moderna Covid-19 Vaccine Bivalent Booster 60yrs & up 10/30/2022  . Moderna Sars-Covid-2 Vaccination 01/02/2020, 01/30/2020, 11/06/2020  . Pfizer Fall 2023 Covid-19 Vaccine 6mos thru 50yrs  05/16/2022  . Pneumococcal Conjugate-13 01/23/2015  . Pneumococcal Polysaccharide-23 10/20/2007  . Respiratory Syncytial Virus Vaccine,Recomb Aduvanted(Arexvy) 12/26/2022  . Td 12/14/2012   Pertinent  Health Maintenance Due  Topic Date Due  . INFLUENZA VACCINE  07/30/2023  . DEXA SCAN  Completed      03/05/2023    2:48 PM 03/12/2023    3:32 PM 04/23/2023    1:49 PM 05/20/2023    2:12 PM 08/14/2023   12:41 PM  Fall Risk  Falls in the past year? 0 0 0 0 0  Was there an injury with Fall? 0 0 0 0 0  Fall Risk Category Calculator 0 0 0 0 0  Patient at Risk for Falls Due to No Fall Risks Impaired balance/gait;Impaired mobility History of fall(s) History of fall(s)   Fall risk Follow up Falls evaluation completed Falls evaluation completed Falls evaluation completed Falls evaluation completed Falls evaluation completed   Functional Status Survey:    Vitals:   08/14/23 1239  BP: (!) 148/60  Pulse: (!) 55  Resp: 20  Temp: 98.3 F (36.8 C)  TempSrc: Temporal  SpO2: 96%  Weight: 157 lb (71.2 kg)  Height: 5\' 2"  (1.575 m)   Body mass index is 28.72 kg/m. Physical Exam Vitals and nursing note reviewed.  Constitutional:      Appearance: Normal appearance.  HENT:     Head: Normocephalic and atraumatic.     Mouth/Throat:     Mouth: Mucous membranes  are moist.  Eyes:     Extraocular Movements: Extraocular movements intact.     Conjunctiva/sclera: Conjunctivae normal.     Pupils: Pupils are equal, round, and reactive to light.  Cardiovascular:     Rate and Rhythm: Normal rate and regular rhythm.     Heart sounds: No murmur heard. Pulmonary:     Effort: Pulmonary effort is normal.     Breath sounds: Normal breath sounds. No rales.  Abdominal:     General: Bowel sounds are normal.     Palpations: Abdomen is soft.     Tenderness: There is no abdominal tenderness.  Musculoskeletal:     Cervical back: Normal range of motion and neck supple.     Right lower leg: No edema.     Left lower leg: No edema.  Skin:    General: Skin is warm and dry.  Neurological:     General: No focal deficit present.     Mental Status: She is alert and oriented to person, place, and time. Mental status is at baseline.     Gait: Gait abnormal.  Psychiatric:        Mood and Affect: Mood normal.        Behavior: Behavior normal.        Thought Content: Thought content normal.        Judgment: Judgment normal.    Labs reviewed: Recent Labs    01/16/23 1941 02/17/23 1105  06/05/23 1548 08/11/23 0000  NA 139 142 144 141  K 3.1* 4.9 4.5 4.2  CL 105 107 109 106  CO2 24 25 25  28*  GLUCOSE 105* 117* 92  --   BUN 29* 31* 20 29*  CREATININE 0.88 1.06* 0.84 1.0  CALCIUM 9.5 9.9 10.0 8.7   Recent Labs    01/16/23 1941 02/17/23 1105 06/05/23 1548 08/11/23 0000  AST 26 20 35 23  ALT 17 11 22 14   ALKPHOS 60  --  61 56  BILITOT 0.2* 0.5 0.9  --   PROT 6.1* 5.9* 5.8*  --   ALBUMIN 3.7  --  3.7 3.4*   Recent Labs    01/16/23 1941 06/05/23 1548 08/11/23 0000  WBC 6.4 10.5 3.8  NEUTROABS 3.8 8.2* 1,915.00  HGB 11.5* 11.8* 10.5*  HCT 35.9* 37.0 32*  MCV 95.7 93.9  --   PLT 179 174 118*   Lab Results  Component Value Date   TSH 3.49 08/11/2023   Lab Results  Component Value Date   HGBA1C 5.8 08/11/2023   Lab Results  Component Value  Date   CHOL 139 06/11/2022   HDL 56 06/11/2022   LDLCALC 70 06/11/2022   TRIG 57 06/11/2022   CHOLHDL 2.5 06/11/2022    Significant Diagnostic Results in last 30 days:  No results found.  Assessment/Plan: COVID-19 virus infection Tolerated Paxlovid, asymptomatic today.   HTN (hypertension) Blood pressure is controlled,  dc'd Losartan by Dr. Maple Hudson 04/02/23? The patient is still taking Losartan 100mg  qd,  continue Metoprolol,  takes ASA, Bun/creat 29/1.0 08/11/23  Elevated TSH   Elevated TSH 5.07 06/11/22, TSH 5.11 02/10/23, TSH 3.49 08/11/23,  not on supplement.  Anxiety  Lorazepam prn available to her.   Allergic rhinitis  off Fluticasone nasal spray, Claritin, Atrovent nasal spray.   Atrial flutter (HCC) 01/05/22, no recurrence, delay cardiology    Family/ staff Communication: plan of care reviewed with the patient and charge nurse.   Labs/tests ordered:  none  Time spend 40 minutes.

## 2023-08-14 NOTE — Assessment & Plan Note (Signed)
Tolerated Paxlovid, asymptomatic today.

## 2023-08-14 NOTE — Assessment & Plan Note (Signed)
Elevated TSH 5.07 06/11/22, TSH 5.11 02/10/23, TSH 3.49 08/11/23,  not on supplement.

## 2023-08-14 NOTE — Assessment & Plan Note (Signed)
off Fluticasone nasal spray, Claritin, Atrovent nasal spray.

## 2023-08-19 ENCOUNTER — Non-Acute Institutional Stay: Payer: Self-pay | Admitting: Nurse Practitioner

## 2023-08-19 ENCOUNTER — Encounter: Payer: Self-pay | Admitting: Nurse Practitioner

## 2023-08-19 DIAGNOSIS — I10 Essential (primary) hypertension: Secondary | ICD-10-CM

## 2023-08-19 DIAGNOSIS — K219 Gastro-esophageal reflux disease without esophagitis: Secondary | ICD-10-CM | POA: Diagnosis not present

## 2023-08-19 DIAGNOSIS — N390 Urinary tract infection, site not specified: Secondary | ICD-10-CM | POA: Diagnosis not present

## 2023-08-19 DIAGNOSIS — F419 Anxiety disorder, unspecified: Secondary | ICD-10-CM

## 2023-08-19 NOTE — Progress Notes (Signed)
Location:   [facility] Room Number: [id number] Place of Service:  SNF (31) Provider: Arna Snipe Holman Bonsignore NP  Teshara Moree X, NP  Patient Care Team: Findley Vi X, NP as PCP - General (Internal Medicine) [name], NP as Nurse Practitioner (Internal Medicine)  Extended Emergency Contact Information Primary Emergency Contact: [facility] Address: [address]          [place], [place] [address] Darden Amber of [place] Home Phone: [phone] Mobile Phone: [phone] Relation: Son  Code Status: DNR Goals of care: Advanced Directive information    08/14/2023   12:42 PM  Advanced Directives  Does Patient Have a Medical Advance Directive? Yes  Type of Advance Directive Living will  Does patient want to make changes to medical advance directive? No - Patient declined     Chief Complaint  Patient presents with  . Acute Visit    Nocturnal urinary incontinence    HPI:  Pt is a 89 y.o. female seen today for an acute visit for c/o nocturnal urinary incontinence, denied dysuria, lower abd/back discomfort, urinary urgency, she is afebrile.      ED eval 06/05/23 for fall, CT head/cervical spine, thoracic spine showed no acute fracture, she was found to have UTI even if the patient stated she is asymptomatic, urine culture: E coli >100,000c/ml, 7 day course of Keflex             ED eval 01/16/23 near syncope, fall, EKG, CT head/cervical spine, X-ray R knee, CBC, CMP unremarkable.             CT head/cervical spine, incidental finding: 6 mm right solid pulmonary nodule within the upper lobe. Per Fleischner Society Guidelines, recommend a non-contrast Chest CT at 6-12 months-ordered. pending CT chest wo CM. CXR 04/02/23 no acute process.              Edema BLE, not apparent, TED, Bun/creat 20/0.84             OSA, CPAP, seeing pulmonology.              A-flutter 01/05/22, no recurrence, delay cardiology OP declined Alendronate after initial consented.  Back pain mid to lower portion, better, X-ray T/L  spine showed multilevel degenerative changes, no acute findings CT head/C+T+L spine, and pelvis,  taking Tylenol, Tramadol             Bone spur R foot, callus of foot, f/u podiatrist.              Allergic rhinitis, off Fluticasone nasal spray, Claritin, Atrovent nasal spray.              Anxiety, Lorazepam prn available to her.              Elevated TSH 5.07 06/11/22, TSH 5.11 02/10/23, TSH 3.49 08/11/23,  not on supplement.             HTN, dc'd Losartan by Dr. Maple Hudson 04/02/23? The patient is still taking Losartan 100mg  qd,  continue Metoprolol,  takes ASA, Bun/creat 29/1.0 08/11/23             GERD, stable on Omeprazole, Hgb 10.5 08/11/23             Hyperlipidemia, off Atorvastatin, LDL 70 06/11/22             Prediabetes, diet controlled, Hgb a1c 5.7 06/11/22       Past Medical History:  Diagnosis Date  . GERD (gastroesophageal reflux disease)   .  Hx of cataract surgery    both eyes  . Hyperglycemia   . Hyperlipidemia   . Hypertension   . Lumbago 04/05/2016  . Macular degeneration    ? which eye(s)  . Obstructive sleep apnea   . Osteoporosis, senile   . Overactive bladder   . Squamous cell skin cancer 2/16   left lower leg   Past Surgical History:  Procedure Laterality Date  . ABDOMINAL HYSTERECTOMY  1960's  . APPENDECTOMY  1960's  . MOLE REMOVAL  2000   facial  . TONSILLECTOMY      Allergies  Allergen Reactions  . Adhesive [Tape]   . Ampicillin   . Elemental Sulfur   . Nitrofuran Derivatives   . Zoloft [Sertraline Hcl] Other (See Comments)    Dizziness, nausea, faint feeling, and nightmares      Allergies as of 08/19/2023       Reactions   Adhesive [tape]    Ampicillin    Elemental Sulfur    Nitrofuran Derivatives    Zoloft [sertraline Hcl] Other (See Comments)   Dizziness, nausea, faint feeling, and nightmares          Medication List        Accurate as of August 19, 2023  3:51 PM. If you have any questions, ask your nurse or doctor.           acetaminophen 500 MG tablet Commonly known as: TYLENOL Take 500 mg by mouth daily.   aspirin EC 81 MG tablet Take 1 tablet (81 mg total) by mouth daily.   Calcium Carb-Cholecalciferol 600-800 MG-UNIT Tabs Take 1 tablet by mouth 2 (two) times daily.   LORazepam 0.5 MG tablet Commonly known as: ATIVAN TAKE ONE TABLET ONCE DAILY AS NEEDED FOR ANXIETY.   losartan 100 MG tablet Commonly known as: COZAAR Take 100 mg by mouth daily.   metoprolol succinate 25 MG 24 hr tablet Commonly known as: TOPROL-XL TAKE 1/2 TABLET ONCE A DAY.   Ocuvite Adult 50+ Caps Take 1 capsule by mouth every evening.   omeprazole 40 MG capsule Commonly known as: PRILOSEC TAKE 1 CAPSULE DAILY 1/2 HOUR BEFORE BREAKFAST FOR STOMACH ACID.   Paxlovid (300/100) 20 x 150 MG & 10 x 100MG  Tbpk Generic drug: nirmatrelvir & ritonavir Take by mouth.   Systane 0.4-0.3 % Gel ophthalmic gel Generic drug: Polyethyl Glycol-Propyl Glycol Place 1 application into both eyes daily.   traMADol 50 MG tablet Commonly known as: ULTRAM Take 1 tablet (50 mg total) by mouth every 6 (six) hours as needed.   TRIAMCINOLONE ACETONIDE(NASAL) NA Place 2 sprays into the nose. Each nostril        Review of Systems  Constitutional:  Negative for appetite change, fatigue and fever.  HENT:  Negative for congestion, hearing loss, trouble swallowing and voice change.   Eyes:  Negative for visual disturbance.  Respiratory:  Negative for cough and shortness of breath.   Cardiovascular:  Negative for leg swelling.  Gastrointestinal:  Negative for abdominal pain and constipation.       X1-2 times in the past 3 months, diet adjustment helped.   Genitourinary:  Negative for dysuria and urgency.       Worsened from occasionally urinary leakage to frequent urinary incontinence even if more at night.   Musculoskeletal:  Positive for arthralgias, back pain and gait problem.  Skin:  Negative for color change.  Neurological:  Negative  for speech difficulty, weakness and light-headedness.       Tingling  in toes sometimes, better after moves around.   Psychiatric/Behavioral:  Negative for confusion and sleep disturbance. The patient is not nervous/anxious.     Immunization History  Administered Date(s) Administered  . Fluad Quad(high Dose 65+) 10/12/2019, 10/14/2022  . Influenza Whole 09/30/2018  . Influenza, High Dose Seasonal PF 10/07/2017, 09/28/2021  . Influenza-Unspecified 09/27/2015, 10/09/2016, 10/10/2020  . Moderna Covid-19 Vaccine Bivalent Booster 72yrs & up 10/30/2022  . Moderna Sars-Covid-2 Vaccination 01/02/2020, 01/30/2020, 11/06/2020  . Pfizer Fall 2023 Covid-19 Vaccine 6mos thru 50yrs  05/16/2022  . Pneumococcal Conjugate-13 01/23/2015  . Pneumococcal Polysaccharide-23 10/20/2007  . Respiratory Syncytial Virus Vaccine,Recomb Aduvanted(Arexvy) 12/26/2022  . Td 12/14/2012   Pertinent  Health Maintenance Due  Topic Date Due  . INFLUENZA VACCINE  07/30/2023  . DEXA SCAN  Completed      03/05/2023    2:48 PM 03/12/2023    3:32 PM 04/23/2023    1:49 PM 05/20/2023    2:12 PM 08/14/2023   12:41 PM  Fall Risk  Falls in the past year? 0 0 0 0 0  Was there an injury with Fall? 0 0 0 0 0  Fall Risk Category Calculator 0 0 0 0 0  Patient at Risk for Falls Due to No Fall Risks Impaired balance/gait;Impaired mobility History of fall(s) History of fall(s)   Fall risk Follow up Falls evaluation completed Falls evaluation completed Falls evaluation completed Falls evaluation completed Falls evaluation completed   Functional Status Survey:    Vitals:   08/19/23 1207  BP: 132/72  Pulse: 62  Resp: 18  Temp: (!) 97.3 F (36.3 C)  SpO2: 94%  Weight: 157 lb (71.2 kg)   Body mass index is 28.72 kg/m. Physical Exam Vitals and nursing note reviewed.  Constitutional:      Appearance: Normal appearance.  HENT:     Head: Normocephalic and atraumatic.     Mouth/Throat:     Mouth: Mucous membranes are moist.   Eyes:     Extraocular Movements: Extraocular movements intact.     Conjunctiva/sclera: Conjunctivae normal.     Pupils: Pupils are equal, round, and reactive to light.  Cardiovascular:     Rate and Rhythm: Normal rate and regular rhythm.     Heart sounds: No murmur heard. Pulmonary:     Effort: Pulmonary effort is normal.     Breath sounds: Normal breath sounds. No rales.  Abdominal:     General: Bowel sounds are normal.     Palpations: Abdomen is soft.     Tenderness: There is no abdominal tenderness.  Musculoskeletal:     Cervical back: Normal range of motion and neck supple.     Right lower leg: No edema.     Left lower leg: No edema.  Skin:    General: Skin is warm and dry.  Neurological:     General: No focal deficit present.     Mental Status: She is alert and oriented to person, place, and time. Mental status is at baseline.     Gait: Gait abnormal.  Psychiatric:        Mood and Affect: Mood normal.        Behavior: Behavior normal.        Thought Content: Thought content normal.        Judgment: Judgment normal.    Labs reviewed: Recent Labs    01/16/23 1941 02/17/23 1105 06/05/23 1548 08/11/23 0000  NA 139 142 144 141  K 3.1* 4.9 4.5 4.2  CL  105 107 109 106  CO2 24 25 25  28*  GLUCOSE 105* 117* 92  --   BUN 29* 31* 20 29*  CREATININE 0.88 1.06* 0.84 1.0  CALCIUM 9.5 9.9 10.0 8.7   Recent Labs    01/16/23 1941 02/17/23 1105 06/05/23 1548 08/11/23 0000  AST 26 20 35 23  ALT 17 11 22 14   ALKPHOS 60  --  61 56  BILITOT 0.2* 0.5 0.9  --   PROT 6.1* 5.9* 5.8*  --   ALBUMIN 3.7  --  3.7 3.4*   Recent Labs    01/16/23 1941 06/05/23 1548 08/11/23 0000  WBC 6.4 10.5 3.8  NEUTROABS 3.8 8.2* 1,915.00  HGB 11.5* 11.8* 10.5*  HCT 35.9* 37.0 32*  MCV 95.7 93.9  --   PLT 179 174 118*   Lab Results  Component Value Date   TSH 3.49 08/11/2023   Lab Results  Component Value Date   HGBA1C 5.8 08/11/2023   Lab Results  Component Value Date   CHOL  139 06/11/2022   HDL 56 06/11/2022   LDLCALC 70 06/11/2022   TRIG 57 06/11/2022   CHOLHDL 2.5 06/11/2022    Significant Diagnostic Results in last 30 days:  No results found.  Assessment/Plan: HTN (hypertension) Blood pressure is controlled, continue Losartan, Metoprolol, ASA  GERD (gastroesophageal reflux disease)  stable on Omeprazole, Hgb 10.5 08/11/23  Anxiety Managed with prn Lorazepam.     Family/ staff Communication: plan of care reviewed with the patient and charge nurse.   Labs/tests ordered: UA C/S  Time spend 40 minutes.

## 2023-08-19 NOTE — Assessment & Plan Note (Signed)
stable on Omeprazole, Hgb 10.5 08/11/23

## 2023-08-19 NOTE — Assessment & Plan Note (Signed)
Managed with prn Lorazepam.

## 2023-08-19 NOTE — Assessment & Plan Note (Signed)
Blood pressure is controlled, continue Losartan, Metoprolol, ASA

## 2023-08-20 ENCOUNTER — Telehealth: Payer: Self-pay | Admitting: Internal Medicine

## 2023-08-20 NOTE — Telephone Encounter (Signed)
Pt son called in bc his mother stated she received a call from the doctors office in regards to why pt has not been using her.

## 2023-08-25 NOTE — Assessment & Plan Note (Signed)
Hx of OAB, noted increased nocturnal urinary incontinence, Cipro 500mg  bid x3 days started 08/24/23.

## 2023-08-27 NOTE — Telephone Encounter (Signed)
Closing message as no indication for call.

## 2023-08-31 DIAGNOSIS — M6281 Muscle weakness (generalized): Secondary | ICD-10-CM | POA: Diagnosis not present

## 2023-08-31 DIAGNOSIS — R29898 Other symptoms and signs involving the musculoskeletal system: Secondary | ICD-10-CM | POA: Diagnosis not present

## 2023-08-31 DIAGNOSIS — N3946 Mixed incontinence: Secondary | ICD-10-CM | POA: Diagnosis not present

## 2023-08-31 DIAGNOSIS — R2681 Unsteadiness on feet: Secondary | ICD-10-CM | POA: Diagnosis not present

## 2023-09-04 DIAGNOSIS — G4733 Obstructive sleep apnea (adult) (pediatric): Secondary | ICD-10-CM | POA: Diagnosis not present

## 2023-09-24 DIAGNOSIS — H353131 Nonexudative age-related macular degeneration, bilateral, early dry stage: Secondary | ICD-10-CM | POA: Diagnosis not present

## 2023-09-24 DIAGNOSIS — H524 Presbyopia: Secondary | ICD-10-CM | POA: Diagnosis not present

## 2023-09-24 DIAGNOSIS — Z961 Presence of intraocular lens: Secondary | ICD-10-CM | POA: Diagnosis not present

## 2023-09-24 DIAGNOSIS — H04123 Dry eye syndrome of bilateral lacrimal glands: Secondary | ICD-10-CM | POA: Diagnosis not present

## 2023-09-24 DIAGNOSIS — H5203 Hypermetropia, bilateral: Secondary | ICD-10-CM | POA: Diagnosis not present

## 2023-09-24 DIAGNOSIS — H52223 Regular astigmatism, bilateral: Secondary | ICD-10-CM | POA: Diagnosis not present

## 2023-09-25 ENCOUNTER — Non-Acute Institutional Stay (SKILLED_NURSING_FACILITY): Payer: Self-pay | Admitting: Sports Medicine

## 2023-09-25 ENCOUNTER — Encounter: Payer: Self-pay | Admitting: Sports Medicine

## 2023-09-25 DIAGNOSIS — M150 Primary generalized (osteo)arthritis: Secondary | ICD-10-CM

## 2023-09-25 DIAGNOSIS — F419 Anxiety disorder, unspecified: Secondary | ICD-10-CM | POA: Diagnosis not present

## 2023-09-25 DIAGNOSIS — I10 Essential (primary) hypertension: Secondary | ICD-10-CM | POA: Diagnosis not present

## 2023-09-25 DIAGNOSIS — M159 Polyosteoarthritis, unspecified: Secondary | ICD-10-CM

## 2023-09-25 DIAGNOSIS — K219 Gastro-esophageal reflux disease without esophagitis: Secondary | ICD-10-CM

## 2023-09-25 NOTE — Progress Notes (Unsigned)
Location:  [facility] Room Number: [id number] Place of Service:  ALF [phone]) Provider: Venita Sheffield, MD    Mast, Man X, NP  Patient Care Team: Mast, Man X, NP as PCP - General (Internal Medicine) Mast, Man X, NP as Nurse Practitioner (Internal Medicine)  Extended Emergency Contact Information Primary Emergency Contact: [facility] Address: [address]          [place], [place] [address] [place] of [place] Home Phone: [phone] Mobile Phone: [phone] Relation: Son  Code Status: DNR Goals of care: Advanced Directive information    09/25/2023    1:56 PM  Advanced Directives  Does Patient Have a Medical Advance Directive? Yes  Type of Advance Directive Living will  Does patient want to make changes to medical advance directive? No - Patient declined     Chief Complaint  Patient presents with   Medical Management of Chronic Issues    Routine Visit   Immunizations    DTAP, Influenza and Covid    HPI:  Pt is a 89 y.o. female seen today for medical management of chronic diseases.   Likes to write poetry and reading books  She eats her breakfast and walks to dining room for lunch and dinner Ambulates  with a Rolator walker. Had a fall about a month ago at her son's place. She missed walker when she got up to reach for it. Denies joint pains She moved to assisted living in June and prior to that [facility] at [facility]. States she is adjust well. Denies fevers, chills, cough, SOB, abdominal pain, nausea, vomiting, dysuria, hematuria, bloody or dark stools. Sleeping fine    Past Medical History:  Diagnosis Date   GERD (gastroesophageal reflux disease)    Hx of cataract surgery    both eyes   Hyperglycemia    Hyperlipidemia    Hypertension    Lumbago 04/05/2016   Macular degeneration    ? which eye(s)   Obstructive sleep apnea    Osteoporosis, senile    Overactive bladder    Squamous cell skin cancer 2/16   left  lower leg   Past Surgical History:  Procedure Laterality Date   ABDOMINAL HYSTERECTOMY  1960's   APPENDECTOMY  1960's   MOLE REMOVAL  2000   facial   TONSILLECTOMY      Allergies  Allergen Reactions   Adhesive [Tape]    Ampicillin    Elemental Sulfur    Nitrofuran Derivatives    Zoloft [Sertraline Hcl] Other (See Comments)    Dizziness, nausea, faint feeling, and nightmares      Outpatient Encounter Medications as of 09/25/2023  Medication Sig   acetaminophen (TYLENOL) 500 MG tablet Take 500 mg by mouth daily.    aspirin EC 81 MG tablet Take 1 tablet (81 mg total) by mouth daily.   Calcium Carb-Cholecalciferol 600-800 MG-UNIT TABS Take 1 tablet by mouth 2 (two) times daily.    LORazepam (ATIVAN) 0.5 MG tablet TAKE ONE TABLET ONCE DAILY AS NEEDED FOR ANXIETY.   losartan (COZAAR) 100 MG tablet Take 100 mg by mouth daily.   metoprolol succinate (TOPROL-XL) 25 MG 24 hr tablet TAKE 1/2 TABLET ONCE A DAY.   Multiple Vitamins-Minerals (OCUVITE ADULT 50+) CAPS Take 1 capsule by mouth every evening.   omeprazole (PRILOSEC) 40 MG capsule TAKE 1 CAPSULE DAILY 1/2 HOUR BEFORE BREAKFAST FOR STOMACH ACID.   Polyethyl Glycol-Propyl Glycol (SYSTANE) 0.4-0.3 % GEL ophthalmic gel Place 1 application into both eyes daily.  traMADol (ULTRAM) 50 MG tablet Take 1 tablet (50 mg total) by mouth every 6 (six) hours as needed.   TRIAMCINOLONE ACETONIDE,NASAL, NA Place 2 sprays into the nose. Each nostril   [DISCONTINUED] PAXLOVID, 300/100, 20 x 150 MG & 10 x 100MG  TBPK Take by mouth.   No facility-administered encounter medications on file as of 09/25/2023.    Review of Systems  Constitutional:  Negative for chills and fever.  HENT:  Negative for sinus pressure and sore throat.   Respiratory:  Negative for cough, shortness of breath and wheezing.   Cardiovascular:  Negative for chest pain, palpitations and leg swelling.  Gastrointestinal:  Negative for abdominal distention, abdominal pain, blood in  stool, constipation, diarrhea, nausea and vomiting.  Genitourinary:  Negative for dysuria, frequency and urgency.  Neurological:  Negative for dizziness, weakness and numbness.  Psychiatric/Behavioral:  Negative for confusion.     Immunization History  Administered Date(s) Administered   Fluad Quad(high Dose 65+) 10/12/2019, 10/14/2022   Influenza Whole 09/30/2018   Influenza, High Dose Seasonal PF 10/07/2017, 09/28/2021   Influenza-Unspecified 09/27/2015, 10/09/2016, 10/10/2020   Moderna Covid-19 Vaccine Bivalent Booster 28yrs & up 10/30/2022   Moderna Sars-Covid-2 Vaccination 01/02/2020, 01/30/2020, 11/06/2020   Pfizer Fall 2023 Covid-19 Vaccine 6mos thru 47yrs  05/16/2022   Pneumococcal Conjugate-13 01/23/2015   Pneumococcal Polysaccharide-23 10/20/2007   Respiratory Syncytial Virus Vaccine,Recomb Aduvanted(Arexvy) 12/26/2022   Td 12/14/2012   Pertinent  Health Maintenance Due  Topic Date Due   INFLUENZA VACCINE  07/30/2023   DEXA SCAN  Completed      03/05/2023    2:48 PM 03/12/2023    3:32 PM 04/23/2023    1:49 PM 05/20/2023    2:12 PM 08/14/2023   12:41 PM  Fall Risk  Falls in the past year? 0 0 0 0 0  Was there an injury with Fall? 0 0 0 0 0  Fall Risk Category Calculator 0 0 0 0 0  Patient at Risk for Falls Due to No Fall Risks Impaired balance/gait;Impaired mobility History of fall(s) History of fall(s)   Fall risk Follow up Falls evaluation completed Falls evaluation completed Falls evaluation completed Falls evaluation completed Falls evaluation completed   Functional Status Survey:    Vitals:   09/25/23 1352  BP: 139/88  Pulse: 63  Resp: 18  Temp: 97.7 F (36.5 C)  SpO2: 96%  Weight: 155 lb 9.6 oz (70.6 kg)  Height: 5\' 6"  (1.676 m)   Body mass index is 25.11 kg/m. Physical Exam Constitutional:      Appearance: Normal appearance.  HENT:     Head: Normocephalic and atraumatic.  Cardiovascular:     Rate and Rhythm: Normal rate and regular rhythm.      Heart sounds: No murmur heard. Pulmonary:     Effort: Pulmonary effort is normal. No respiratory distress.     Breath sounds: Normal breath sounds. No wheezing.  Abdominal:     General: Bowel sounds are normal. There is no distension.     Tenderness: There is no abdominal tenderness. There is no guarding or rebound.  Musculoskeletal:        General: No swelling or tenderness.  Neurological:     Mental Status: She is alert. Mental status is at baseline.     Sensory: No sensory deficit.     Motor: No weakness.     Labs reviewed: Recent Labs    01/16/23 1941 02/17/23 1105 06/05/23 1548 08/11/23 0000  NA 139 142 144 141  K  3.1* 4.9 4.5 4.2  CL 105 107 109 106  CO2 24 25 25  28*  GLUCOSE 105* 117* 92  --   BUN 29* 31* 20 29*  CREATININE 0.88 1.06* 0.84 1.0  CALCIUM 9.5 9.9 10.0 8.7   Recent Labs    01/16/23 1941 02/17/23 1105 06/05/23 1548 08/11/23 0000  AST 26 20 35 23  ALT 17 11 22 14   ALKPHOS 60  --  61 56  BILITOT 0.2* 0.5 0.9  --   PROT 6.1* 5.9* 5.8*  --   ALBUMIN 3.7  --  3.7 3.4*   Recent Labs    01/16/23 1941 06/05/23 1548 08/11/23 0000  WBC 6.4 10.5 3.8  NEUTROABS 3.8 8.2* 1,915.00  HGB 11.5* 11.8* 10.5*  HCT 35.9* 37.0 32*  MCV 95.7 93.9  --   PLT 179 174 118*   Lab Results  Component Value Date   TSH 3.49 08/11/2023   Lab Results  Component Value Date   HGBA1C 5.8 08/11/2023   Lab Results  Component Value Date   CHOL 139 06/11/2022   HDL 56 06/11/2022   LDLCALC 70 06/11/2022   TRIG 57 06/11/2022   CHOLHDL 2.5 06/11/2022    Significant Diagnostic Results in last 30 days:  No results found.  Assessment/Plan  1. Primary hypertension Cont with losartan, metoprolol    Latest Ref Rng & Units 08/11/2023   12:00 AM 06/05/2023    3:48 PM 02/17/2023   11:05 AM  BMP  Glucose 70 - 99 mg/dL  92  161   BUN 4 - 21 29     20  31    Creatinine 0.5 - 1.1 1.0     0.84  1.06   BUN/Creat Ratio 6 - 22 (calc)   29   Sodium 137 - 147 141     144  142    Potassium 3.5 - 5.1 mEq/L 4.2     4.5  4.9   Chloride 99 - 108 106     109  107   CO2 13 - 22 28     25  25    Calcium 8.7 - 10.7 8.7     10.0  9.9      This result is from an external source.     2. Gastroesophageal reflux disease, unspecified whether esophagitis present Denies bloody or dark stools Cont with omeprazole  3. Anxiety Reports doing fine Has lorazepam as needed but only took 1 dose within the last 1 month  4. Primary osteoarthritis involving multiple joints Cont with tylenol prn   Family/ staff Communication: care plan discussed with the nurse  Labs/tests ordered:  none

## 2023-09-28 ENCOUNTER — Encounter: Payer: Self-pay | Admitting: Sports Medicine

## 2023-10-04 DIAGNOSIS — G4733 Obstructive sleep apnea (adult) (pediatric): Secondary | ICD-10-CM | POA: Diagnosis not present

## 2023-10-19 DIAGNOSIS — N8110 Cystocele, unspecified: Secondary | ICD-10-CM | POA: Diagnosis not present

## 2023-10-26 ENCOUNTER — Encounter: Payer: Self-pay | Admitting: Sports Medicine

## 2023-10-26 ENCOUNTER — Non-Acute Institutional Stay: Payer: PPO | Admitting: Sports Medicine

## 2023-10-26 DIAGNOSIS — R2681 Unsteadiness on feet: Secondary | ICD-10-CM | POA: Diagnosis not present

## 2023-10-26 DIAGNOSIS — R296 Repeated falls: Secondary | ICD-10-CM

## 2023-10-26 NOTE — Progress Notes (Signed)
Provider:  Venita Sheffield MD Location:   [facility]   Place of Service:   Assisted living  PCP: Mast, Man X, NP Patient Care Team: Mast, Man X, NP as PCP - General (Internal Medicine) Mast, Man X, NP as Nurse Practitioner (Internal Medicine)  Extended Emergency Contact Information Primary Emergency Contact: [facility] Address: [address]          [place], [place] [address] [place] Home Phone: [phone] Mobile Phone: [phone] Relation: Son  Code Status:  Goals of Care: Advanced Directive information    09/25/2023    1:56 PM  Advanced Directives  Does Patient Have a Medical Advance Directive? Yes  Type of Advance Directive Living will  Does patient want to make changes to medical advance directive? No - Patient declined      No chief complaint on file.   HPI: Patient is a 89 y.o. female seen today for acute visit for an unwitnessed over the weekend. Pt seen and examined in her room. Pt states that she was standing next to the table in her room, her legs gave out and had a fall.  Denies being dizzy or lightheaded Currently not in pain. Pt reports that she is trying to be careful and use walker all the time She walks with the help of walker and goes to dining room to have her meals.  Pt denies chest pain, palpitations, SOB, abdominal pain, nausea, vomiting, dysuria, hematuria, bloody or dark stools. She had a bruise on rtside of her forehead  Denies headache nausea, vomiting, blurring or double vision  Past Medical History:  Diagnosis Date   GERD (gastroesophageal reflux disease)    Hx of cataract surgery    both eyes   Hyperglycemia    Hyperlipidemia    Hypertension    Lumbago 04/05/2016   Macular degeneration    ? which eye(s)   Obstructive sleep apnea    Osteoporosis, senile    Overactive bladder    Squamous cell skin cancer 2/16   left lower leg   Past Surgical History:  Procedure Laterality Date   ABDOMINAL  HYSTERECTOMY  1960's   APPENDECTOMY  1960's   MOLE REMOVAL  2000   facial   TONSILLECTOMY      reports that she has never smoked. She has never used smokeless tobacco. She reports that she does not currently use alcohol. She reports that she does not use drugs. Social History   Socioeconomic History   Marital status: Widowed    Spouse name: Not on file   Number of children: Not on file   Years of education: Not on file   Highest education level: Not on file  Occupational History   Occupation: retired Scientist, research (physical sciences)  Tobacco Use   Smoking status: Never   Smokeless tobacco: Never  Vaping Use   Vaping status: Never Used  Substance and Sexual Activity   Alcohol use: Not Currently    Comment: occassionally 1/2 glass of wine 1-2 x month   Drug use: No   Sexual activity: Never  Other Topics Concern   Not on file  Social History Narrative   Lives at [facility] since 11/12/15   Widow   Never smoked   Alcohol occasionally 1/2 glass of wine   Exercise none   POA, Living Will   Walks with walker   Social Determinants of Health   Financial Resource Strain: Not on file  Food Insecurity: Not on file  Transportation Needs: Not  on file  Physical Activity: Not on file  Stress: Not on file  Social Connections: Not on file  Intimate Partner Violence: Not on file    Functional Status Survey:    Family History  Problem Relation Age of Onset   Heart disease Mother    Heart disease Father     Health Maintenance  Topic Date Due   DTaP/Tdap/Td (2 - Tdap) 12/14/2022   INFLUENZA VACCINE  07/30/2023   COVID-19 Vaccine (5 - 2023-24 season) 08/30/2023   Medicare Annual Wellness (AWV)  12/05/2023   Zoster Vaccines- Shingrix (1 of 2) 12/29/2094 (Originally 01/24/1976)   Pneumonia Vaccine 38+ Years old  Completed   DEXA SCAN  Completed   HPV VACCINES  Aged Out    Allergies  Allergen Reactions   Adhesive [Tape]    Ampicillin    Elemental Sulfur    Nitrofuran  Derivatives    Zoloft [Sertraline Hcl] Other (See Comments)    Dizziness, nausea, faint feeling, and nightmares      Outpatient Encounter Medications as of 10/26/2023  Medication Sig   acetaminophen (TYLENOL) 500 MG tablet Take 500 mg by mouth daily.    aspirin EC 81 MG tablet Take 1 tablet (81 mg total) by mouth daily.   Calcium Carb-Cholecalciferol 600-800 MG-UNIT TABS Take 1 tablet by mouth 2 (two) times daily.    LORazepam (ATIVAN) 0.5 MG tablet TAKE ONE TABLET ONCE DAILY AS NEEDED FOR ANXIETY.   losartan (COZAAR) 100 MG tablet Take 100 mg by mouth daily.   metoprolol succinate (TOPROL-XL) 25 MG 24 hr tablet TAKE 1/2 TABLET ONCE A DAY.   Multiple Vitamins-Minerals (OCUVITE ADULT 50+) CAPS Take 1 capsule by mouth every evening.   omeprazole (PRILOSEC) 40 MG capsule TAKE 1 CAPSULE DAILY 1/2 HOUR BEFORE BREAKFAST FOR STOMACH ACID.   Polyethyl Glycol-Propyl Glycol (SYSTANE) 0.4-0.3 % GEL ophthalmic gel Place 1 application into both eyes daily.   traMADol (ULTRAM) 50 MG tablet Take 1 tablet (50 mg total) by mouth every 6 (six) hours as needed.   TRIAMCINOLONE ACETONIDE,NASAL, NA Place 2 sprays into the nose. Each nostril   No facility-administered encounter medications on file as of 10/26/2023.    Review of Systems  Constitutional:  Negative for fever.  Respiratory:  Negative for cough, shortness of breath and wheezing.   Cardiovascular:  Negative for chest pain, palpitations and leg swelling.  Gastrointestinal:  Negative for abdominal distention, abdominal pain, blood in stool, constipation, diarrhea, nausea and vomiting.  Genitourinary:  Negative for dysuria, frequency and urgency.  Neurological:  Negative for dizziness, weakness and numbness.  Psychiatric/Behavioral:  Negative for confusion.     There were no vitals filed for this visit. There is no height or weight on file to calculate BMI. Physical Exam Constitutional:      Appearance: Normal appearance.  HENT:     Head:  Normocephalic and atraumatic.  Cardiovascular:     Rate and Rhythm: Normal rate and regular rhythm.  Pulmonary:     Effort: Pulmonary effort is normal. No respiratory distress.     Breath sounds: Normal breath sounds. No wheezing.  Abdominal:     General: Bowel sounds are normal. There is no distension.     Tenderness: There is no abdominal tenderness. There is no guarding or rebound.  Musculoskeletal:        General: No swelling or tenderness.  Skin:    Comments: Bruise on her Rt forehead  Neurological:     Mental Status: She is alert.  Mental status is at baseline.     Sensory: No sensory deficit.     Motor: No weakness.     Labs reviewed: Basic Metabolic Panel: Recent Labs    01/16/23 1941 02/17/23 1105 06/05/23 1548 08/11/23 0000  NA 139 142 144 141  K 3.1* 4.9 4.5 4.2  CL 105 107 109 106  CO2 24 25 25  28*  GLUCOSE 105* 117* 92  --   BUN 29* 31* 20 29*  CREATININE 0.88 1.06* 0.84 1.0  CALCIUM 9.5 9.9 10.0 8.7   Liver Function Tests: Recent Labs    01/16/23 1941 02/17/23 1105 06/05/23 1548 08/11/23 0000  AST 26 20 35 23  ALT 17 11 22 14   ALKPHOS 60  --  61 56  BILITOT 0.2* 0.5 0.9  --   PROT 6.1* 5.9* 5.8*  --   ALBUMIN 3.7  --  3.7 3.4*   No results for input(s): "LIPASE", "AMYLASE" in the last 8760 hours. No results for input(s): "AMMONIA" in the last 8760 hours. CBC: Recent Labs    01/16/23 1941 06/05/23 1548 08/11/23 0000  WBC 6.4 10.5 3.8  NEUTROABS 3.8 8.2* 1,915.00  HGB 11.5* 11.8* 10.5*  HCT 35.9* 37.0 32*  MCV 95.7 93.9  --   PLT 179 174 118*   Cardiac Enzymes: Recent Labs    06/05/23 1548  CKTOTAL 486*   BNP: Invalid input(s): "POCBNP" Lab Results  Component Value Date   HGBA1C 5.8 08/11/2023   Lab Results  Component Value Date   TSH 3.49 08/11/2023   Lab Results  Component Value Date   VITAMINB12 420 07/20/2017   Lab Results  Component Value Date   FOLATE 13.5 07/20/2017   Lab Results  Component Value Date    FERRITIN 34 01/25/2018    Imaging and Procedures obtained prior to SNF admission: CT Thoracic Spine Wo Contrast  Result Date: 06/05/2023 CLINICAL DATA:  Back trauma. EXAM: CT THORACIC SPINE WITHOUT CONTRAST TECHNIQUE: Multidetector CT images of the thoracic were obtained using the standard protocol without intravenous contrast. RADIATION DOSE REDUCTION: This exam was performed according to the departmental dose-optimization program which includes automated exposure control, adjustment of the mA and/or kV according to patient size and/or use of iterative reconstruction technique. COMPARISON:  06/03/2021 FINDINGS: Alignment: Mild curvature of the thoracic spine convex right unchanged. Vertebrae: Vertebral body heights are maintained. There is moderate spondylosis throughout the thoracic spine. No acute fracture or subluxation. Mild neural foraminal narrowing at several levels of the lower thoracic spine due to adjacent bony spurring. Paraspinal and other soft tissues: Negative.  No canal stenosis. Disc levels: Mild vacuum disc over several levels of the lower thoracic spine and thoracolumbar junction unchanged. Moderate size hiatal hernia is present. This aortic atherosclerosis with significant atherosclerotic plaque over the thoracoabdominal aorta. IMPRESSION: 1. No acute findings. 2. Moderate spondylosis of the thoracic spine. 3. Moderate size hiatal hernia. 4. Aortic atherosclerosis. Aortic Atherosclerosis (ICD10-I70.0). Electronically Signed   By: Elberta Fortis M.D.   On: 06/05/2023 15:58   CT Head Wo Contrast  Result Date: 06/05/2023 CLINICAL DATA:  Head and neck trauma EXAM: CT HEAD WITHOUT CONTRAST CT CERVICAL SPINE WITHOUT CONTRAST TECHNIQUE: Multidetector CT imaging of the head and cervical spine was performed following the standard protocol without intravenous contrast. Multiplanar CT image reconstructions of the cervical spine were also generated. RADIATION DOSE REDUCTION: This exam was performed  according to the departmental dose-optimization program which includes automated exposure control, adjustment of the mA and/or kV according  to patient size and/or use of iterative reconstruction technique. COMPARISON:  01/16/2019 FINDINGS: CT HEAD FINDINGS Brain: No evidence of acute infarction, hemorrhage, hydrocephalus, extra-axial collection or mass lesion/mass effect. Extensive periventricular and deep white matter hypodensity. Vascular: No hyperdense vessel or unexpected calcification. Skull: Normal. Negative for fracture or focal lesion. Sinuses/Orbits: No acute finding. Other: None. CT CERVICAL SPINE FINDINGS Alignment: Normal. Skull base and vertebrae: No acute fracture. No primary bone lesion or focal pathologic process. Soft tissues and spinal canal: No prevertebral fluid or swelling. No visible canal hematoma. Disc levels: Moderate multilevel disc space height loss and osteophytosis, worst at the lower cervical levels. Upper chest: Negative. Other: None. IMPRESSION: 1. No acute intracranial pathology. Small-vessel white matter disease. 2. No fracture or static subluxation of the cervical spine. 3. Moderate multilevel cervical disc degenerative disease. Electronically Signed   By: Jearld Lesch M.D.   On: 06/05/2023 15:23   CT Cervical Spine Wo Contrast  Result Date: 06/05/2023 CLINICAL DATA:  Head and neck trauma EXAM: CT HEAD WITHOUT CONTRAST CT CERVICAL SPINE WITHOUT CONTRAST TECHNIQUE: Multidetector CT imaging of the head and cervical spine was performed following the standard protocol without intravenous contrast. Multiplanar CT image reconstructions of the cervical spine were also generated. RADIATION DOSE REDUCTION: This exam was performed according to the departmental dose-optimization program which includes automated exposure control, adjustment of the mA and/or kV according to patient size and/or use of iterative reconstruction technique. COMPARISON:  01/16/2019 FINDINGS: CT HEAD FINDINGS  Brain: No evidence of acute infarction, hemorrhage, hydrocephalus, extra-axial collection or mass lesion/mass effect. Extensive periventricular and deep white matter hypodensity. Vascular: No hyperdense vessel or unexpected calcification. Skull: Normal. Negative for fracture or focal lesion. Sinuses/Orbits: No acute finding. Other: None. CT CERVICAL SPINE FINDINGS Alignment: Normal. Skull base and vertebrae: No acute fracture. No primary bone lesion or focal pathologic process. Soft tissues and spinal canal: No prevertebral fluid or swelling. No visible canal hematoma. Disc levels: Moderate multilevel disc space height loss and osteophytosis, worst at the lower cervical levels. Upper chest: Negative. Other: None. IMPRESSION: 1. No acute intracranial pathology. Small-vessel white matter disease. 2. No fracture or static subluxation of the cervical spine. 3. Moderate multilevel cervical disc degenerative disease. Electronically Signed   By: Jearld Lesch M.D.   On: 06/05/2023 15:23    Assessment/Plan  1. Unwitnessed fall Pt with unwitnessed fall Has a bruise on her Rt forehead  Denies headache, nausea, vomiting No change in her mental status  2. Unsteady gait Will refer to PT for muscle strengthening and balance training exercises.    Family/ staff Communication:   Labs/tests ordered: care plan discussed with the nursing staff    I spent greater than 30  minutes for the care of this patient in face to face time, chart review, clinical documentation, patient education.

## 2023-10-27 ENCOUNTER — Non-Acute Institutional Stay (INDEPENDENT_AMBULATORY_CARE_PROVIDER_SITE_OTHER): Payer: PPO | Admitting: Nurse Practitioner

## 2023-10-27 ENCOUNTER — Encounter: Payer: Self-pay | Admitting: Nurse Practitioner

## 2023-10-27 DIAGNOSIS — Z0000 Encounter for general adult medical examination without abnormal findings: Secondary | ICD-10-CM

## 2023-10-27 NOTE — Progress Notes (Signed)
Subjective:   BURNETT WOMAC is a 89 y.o. female who presents for Medicare Annual (Subsequent) preventive examination.  Visit Complete: In person  Patient Medicare AWV questionnaire was completed by the patient on 10/27/23; I have confirmed that all information answered by patient is correct and no changes since this date.  Cardiac Risk Factors include: advanced age (>58men, >2 women);dyslipidemia;hypertension     Objective:    Today's Vitals   10/27/23 1012  BP: (!) 153/80  Pulse: 73  Resp: 18  Temp: 97.6 F (36.4 C)  Weight: 159 lb 12.8 oz (72.5 kg)  Height: 5\' 6"  (1.676 m)   Body mass index is 25.79 kg/m.     10/27/2023   10:27 AM 09/25/2023    1:56 PM 08/14/2023   12:42 PM 06/05/2023    7:03 PM 03/12/2023    3:33 PM 01/07/2023    9:18 PM 12/04/2022    9:47 AM  Advanced Directives  Does Patient Have a Medical Advance Directive? Yes Yes Yes Yes Yes Yes Yes  Type of Estate agent of [place];Out of facility DNR (pink MOST or yellow form);Living will Living will Living will Living will;Healthcare Power of Attorney Living will;Healthcare Power of Attorney Living will;Healthcare Power of [place];Out of facility DNR (pink MOST or yellow form) Living will;Out of facility DNR (pink MOST or yellow form)  Does patient want to make changes to medical advance directive? No - Patient declined No - Patient declined No - Patient declined   No - Patient declined No - Patient declined  Copy of Healthcare Power of Attorney in Chart? Yes - validated most recent copy scanned in chart (See row information)        Pre-existing out of facility DNR order (yellow form or pink MOST form) Pink MOST/Yellow Form most recent copy in chart - Physician notified to receive inpatient order          Current Medications (verified) Outpatient Encounter Medications as of 10/27/2023  Medication Sig   acetaminophen (TYLENOL) 500 MG tablet Take 500 mg by mouth daily.    aspirin  EC 81 MG tablet Take 1 tablet (81 mg total) by mouth daily.   Calcium Carb-Cholecalciferol 600-800 MG-UNIT TABS Take 1 tablet by mouth 2 (two) times daily.    LORazepam (ATIVAN) 0.5 MG tablet TAKE ONE TABLET ONCE DAILY AS NEEDED FOR ANXIETY.   losartan (COZAAR) 100 MG tablet Take 100 mg by mouth daily.   metoprolol succinate (TOPROL-XL) 25 MG 24 hr tablet TAKE 1/2 TABLET ONCE A DAY.   Multiple Vitamins-Minerals (OCUVITE ADULT 50+) CAPS Take 1 capsule by mouth every evening.   omeprazole (PRILOSEC) 40 MG capsule TAKE 1 CAPSULE DAILY 1/2 HOUR BEFORE BREAKFAST FOR STOMACH ACID.   Polyethyl Glycol-Propyl Glycol (SYSTANE) 0.4-0.3 % GEL ophthalmic gel Place 1 application into both eyes daily.   traMADol (ULTRAM) 50 MG tablet Take 1 tablet (50 mg total) by mouth every 6 (six) hours as needed.   TRIAMCINOLONE ACETONIDE,NASAL, NA Place 2 sprays into the nose. Each nostril   No facility-administered encounter medications on file as of 10/27/2023.    Allergies (verified) Adhesive [tape], Ampicillin, Elemental sulfur, Nitrofuran derivatives, and Zoloft [sertraline hcl]   History: Past Medical History:  Diagnosis Date   GERD (gastroesophageal reflux disease)    Hx of cataract surgery    both eyes   Hyperglycemia    Hyperlipidemia    Hypertension    Lumbago 04/05/2016   Macular degeneration    ?  which eye(s)   Obstructive sleep apnea    Osteoporosis, senile    Overactive bladder    Squamous cell skin cancer 2/16   left lower leg   Past Surgical History:  Procedure Laterality Date   ABDOMINAL HYSTERECTOMY  1960's   APPENDECTOMY  1960's   MOLE REMOVAL  2000   facial   TONSILLECTOMY     Family History  Problem Relation Age of Onset   Heart disease Mother    Heart disease Father    Social History   Socioeconomic History   Marital status: Widowed    Spouse name: Not on file   Number of children: Not on file   Years of education: Not on file   Highest education level: Not on file   Occupational History   Occupation: retired Scientist, research (physical sciences)  Tobacco Use   Smoking status: Never   Smokeless tobacco: Never  Vaping Use   Vaping status: Never Used  Substance and Sexual Activity   Alcohol use: Not Currently    Comment: occassionally 1/2 glass of wine 1-2 x month   Drug use: No   Sexual activity: Never  Other Topics Concern   Not on file  Social History Narrative   Lives at [facility] since 11/12/15   Widow   Never smoked   Alcohol occasionally 1/2 glass of wine   Exercise none   POA, Living Will   Walks with walker   Social Determinants of Health   Financial Resource Strain: Not on file  Food Insecurity: Not on file  Transportation Needs: Not on file  Physical Activity: Not on file  Stress: Not on file  Social Connections: Not on file    Tobacco Counseling Counseling given: Not Answered   Clinical Intake:  Pre-visit preparation completed: Yes  Pain : No/denies pain     BMI - recorded: 25.79 Nutritional Status: BMI 25 -29 Overweight Nutritional Risks: None Diabetes: No  How often do you need to have someone help you when you read instructions, pamphlets, or other written materials from your doctor or pharmacy?: 3 - Sometimes What is the last grade level you completed in school?: 2 years college  Interpreter Needed?: No  Information entered by :: Tajai Suder Nedra Hai   Activities of Daily Living    11/02/2023    4:18 PM 12/04/2022    4:04 PM  In your present state of health, do you have any difficulty performing the following activities:  Hearing? 0 0  Vision? 0 0  Difficulty concentrating or making decisions? 0 0  Walking or climbing stairs? 1 0  Dressing or bathing? 1 0  Doing errands, shopping? 1 0  Preparing Food and eating ? N N  Using the Toilet? N N  In the past six months, have you accidently leaked urine? Y N  Do you have problems with loss of bowel control? N Y  Comment  occasionally  Managing your Medications? Y N   Managing your Finances? Y N  Housekeeping or managing your Housekeeping? Y N    Patient Care Team: Hiroto Saltzman X, NP as PCP - General (Internal Medicine) Codie Hainer X, NP as Nurse Practitioner (Internal Medicine)  Indicate any recent Medical Services you may have received from other than [facility] providers in the past year (date may be approximate).     Assessment:   This is a routine wellness examination for Burnette.  Hearing/Vision screen No results found.   Goals Addressed  This Visit's Progress    Maintain Mobility and Function       Evidence-based guidance:  Acknowledge and validate impact of pain, loss of strength and potential disfigurement (hand osteoarthritis) on mental health and daily life, such as social isolation, anxiety, depression, impaired sexual relationship and   injury from falls.  Anticipate referral to physical or occupational therapy for assessment, therapeutic exercise and recommendation for adaptive equipment or assistive devices; encourage participation.  Assess impact on ability to perform activities of daily living, as well as engage in sports and leisure events or requirements of work or school.  Provide anticipatory guidance and reassurance about the benefit of exercise to maintain function; acknowledge and normalize fear that exercise may worsen symptoms.  Encourage regular exercise, at least 10 minutes at a time for 45 minutes per week; consider yoga, water exercise and proprioceptive exercises; encourage use of wearable activity tracker to increase motivation and adherence.  Encourage maintenance or resumption of daily activities, including employment, as pain allows and with minimal exposure to trauma.  Assist patient to advocate for adaptations to the work environment.  Consider level of pain and function, gender, age, lifestyle, patient preference, quality of life, readiness and ?ocapacity to benefit? when recommending patients for  orthopaedic surgery consultation.  Explore strategies, such as changes to medication regimen or activity that enables patient to anticipate and manage flare-ups that increase deconditioning and disability.  Explore patient preferences; encourage exposure to a broader range of activities that have been avoided for fear of experiencing pain.  Identify barriers to participation in therapy or exercise, such as pain with activity, anticipated or imagined pain.  Monitor postoperative joint replacement or any preexisting joint replacement for ongoing pain and loss of function; provide social support and encouragement throughout recovery.   Notes:        Depression Screen    11/02/2023    4:20 PM 03/12/2023    3:33 PM 02/05/2021    2:08 PM 10/28/2018    3:33 PM 11/03/2017   11:08 AM 09/23/2016   10:43 AM 05/29/2016    1:33 PM  PHQ 2/9 Scores  PHQ - 2 Score 0 0 0 0 0 1 0    Fall Risk    08/14/2023   12:41 PM 05/20/2023    2:12 PM 04/23/2023    1:49 PM 03/12/2023    3:32 PM 03/05/2023    2:48 PM  Fall Risk   Falls in the past year? 0 0 0 0 0  Number falls in past yr: 0 0 0 0 0  Injury with Fall? 0 0 0 0 0  Risk for fall due to :  History of fall(s) History of fall(s) Impaired balance/gait;Impaired mobility No Fall Risks  Follow up Falls evaluation completed Falls evaluation completed Falls evaluation completed Falls evaluation completed Falls evaluation completed    MEDICARE RISK AT HOME: Medicare Risk at Home Any stairs in or around the home?: Yes If so, are there any without handrails?: No Home free of loose throw rugs in walkways, pet beds, electrical cords, etc?: Yes Adequate lighting in your home to reduce risk of falls?: Yes Life alert?: No Use of a cane, walker or w/c?: Yes Grab bars in the bathroom?: Yes Shower chair or bench in shower?: Yes Elevated toilet seat or a handicapped toilet?: Yes  TIMED UP AND GO:  Was the test performed?  Yes  Length of time to ambulate 10 feet: 10  sec Gait slow and steady with assistive device  Cognitive Function:    12/04/2022    4:08 PM 09/15/2019    4:40 PM 08/25/2019    3:18 PM 07/20/2018    9:49 AM 11/03/2017   11:09 AM  MMSE - Mini Mental State Exam  Not completed:  -- -- -- --  Orientation to time 5 4 5 5 5   Orientation to Place 5 5 5 5 5   Registration 3 3 3 3 3   Attention/ Calculation 5 5 5 5 5   Recall 1 3 0 1 2  Language- name 2 objects 2 2 2 2 2   Language- repeat 1 1 1 1 1   Language- follow 3 step command 3 3 3 3 3   Language- read & follow direction 1 1 1 1 1   Write a sentence 1 1 1 1 1   Copy design 1 1 1 1 1   Total score 28 29 27 28 29         02/05/2021    2:14 PM  6CIT Screen  What Year? 0 points  What month? 0 points  What time? 0 points  Count back from 20 0 points  Months in reverse 0 points  Repeat phrase 2 points  Total Score 2 points    Immunizations Immunization History  Administered Date(s) Administered   Fluad Quad(high Dose 65+) 10/12/2019, 10/14/2022   Influenza Whole 09/30/2018   Influenza, High Dose Seasonal PF 10/07/2017, 09/28/2021   Influenza-Unspecified 09/27/2015, 10/09/2016, 10/10/2020, 09/30/2023   Moderna Covid-19 Vaccine Bivalent Booster 59yrs & up 10/30/2022   Moderna SARS-COV2 Booster Vaccination 10/14/2023   Moderna Sars-Covid-2 Vaccination 01/02/2020, 01/30/2020, 11/06/2020   Pfizer Fall 2023 Covid-19 Vaccine 6mos thru 54yrs  05/16/2022   Pneumococcal Conjugate-13 01/23/2015   Pneumococcal Polysaccharide-23 10/20/2007   Respiratory Syncytial Virus Vaccine,Recomb Aduvanted(Arexvy) 12/26/2022   Td 12/14/2012   Tdap 11/05/2023    TDAP status: Up to date  Flu Vaccine status: Up to date  Pneumococcal vaccine status: Up to date  Covid-19 vaccine status: Information provided on how to obtain vaccines.   Qualifies for Shingles Vaccine? Yes   Zostavax completed Yes   Shingrix Completed?: Yes  Screening Tests Health Maintenance  Topic Date Due   Zoster Vaccines-  Shingrix (1 of 2) 12/29/2094 (Originally 01/24/1974)   COVID-19 Vaccine (6 - 2023-24 season) 12/09/2023   Medicare Annual Wellness (AWV)  10/26/2024   DTaP/Tdap/Td (3 - Td or Tdap) 11/04/2033   Pneumonia Vaccine 59+ Years old  Completed   INFLUENZA VACCINE  Completed   DEXA SCAN  Completed   HPV VACCINES  Aged Out    Health Maintenance  There are no preventive care reminders to display for this patient.   Colorectal cancer screening: No longer required.   Mammogram status: No longer required due to aged out.  Bone Density status: Completed 07/16/21. Results reflect: Bone density results: OSTEOPENIA. Repeat every 2 years.  Lung Cancer Screening: (Low Dose CT Chest recommended if Age 28-80 years, 20 pack-year currently smoking OR have quit w/in 15years.) does not qualify.    Additional Screening:  Hepatitis C Screening: does not qualify;   Vision Screening: Recommended annual ophthalmology exams for early detection of glaucoma and other disorders of the eye. Is the patient up to date with their annual eye exam?  yes 09/24/23 Who is the provider or what is the name of the office in which the patient attends annual eye exams? [facility] will provide. If pt is not established with a provider, would they like to be referred to a provider to establish  care? [facility] [place] [address]  Dental Screening: Recommended annual dental exams for proper oral hygiene Diabetic Foot Exam: NA  Community Resource Referral / Chronic Care Management: CRR required this visit?  No   CCM required this visit?  No     Plan:     I have personally reviewed and noted the following in the patient's chart:   Medical and social history Use of alcohol, tobacco or illicit drugs  Current medications and supplements including opioid prescriptions. Patient is not currently taking opioid prescriptions. Functional ability and status Nutritional status Physical activity Advanced directives List of other  physicians Hospitalizations, surgeries, and ER visits in previous 12 months Vitals Screenings to include cognitive, depression, and falls Referrals and appointments  In addition, I have reviewed and discussed with patient certain preventive protocols, quality metrics, and best practice recommendations. A written personalized care plan for preventive services as well as general preventive health recommendations were provided to patient.     Kelie Gainey X Jovanka [place]gate, NP   11/06/2023   After Visit Summary: (In Person-Declined) Patient declined AVS at this time.

## 2023-11-02 ENCOUNTER — Encounter: Payer: Self-pay | Admitting: Nurse Practitioner

## 2023-11-04 DIAGNOSIS — G4733 Obstructive sleep apnea (adult) (pediatric): Secondary | ICD-10-CM | POA: Diagnosis not present

## 2023-11-05 ENCOUNTER — Other Ambulatory Visit: Payer: Self-pay | Admitting: Sports Medicine

## 2023-11-05 DIAGNOSIS — Z780 Asymptomatic menopausal state: Secondary | ICD-10-CM

## 2023-11-05 DIAGNOSIS — R5381 Other malaise: Secondary | ICD-10-CM

## 2023-11-09 DIAGNOSIS — M6281 Muscle weakness (generalized): Secondary | ICD-10-CM | POA: Diagnosis not present

## 2023-11-09 DIAGNOSIS — R2681 Unsteadiness on feet: Secondary | ICD-10-CM | POA: Diagnosis not present

## 2023-11-19 ENCOUNTER — Non-Acute Institutional Stay: Payer: PPO | Admitting: Nurse Practitioner

## 2023-11-19 VITALS — BP 160/75 | HR 70 | Temp 97.4°F | Resp 20 | Wt 160.4 lb

## 2023-11-19 DIAGNOSIS — I4892 Unspecified atrial flutter: Secondary | ICD-10-CM

## 2023-11-19 DIAGNOSIS — K219 Gastro-esophageal reflux disease without esophagitis: Secondary | ICD-10-CM

## 2023-11-19 DIAGNOSIS — M150 Primary generalized (osteo)arthritis: Secondary | ICD-10-CM | POA: Diagnosis not present

## 2023-11-19 DIAGNOSIS — I10 Essential (primary) hypertension: Secondary | ICD-10-CM | POA: Diagnosis not present

## 2023-11-19 DIAGNOSIS — F419 Anxiety disorder, unspecified: Secondary | ICD-10-CM

## 2023-11-19 DIAGNOSIS — M8580 Other specified disorders of bone density and structure, unspecified site: Secondary | ICD-10-CM

## 2023-11-19 DIAGNOSIS — Z780 Asymptomatic menopausal state: Secondary | ICD-10-CM

## 2023-11-19 DIAGNOSIS — R7303 Prediabetes: Secondary | ICD-10-CM

## 2023-11-19 NOTE — Assessment & Plan Note (Signed)
stable on Omeprazole, Hgb 10.5 08/11/23

## 2023-11-19 NOTE — Assessment & Plan Note (Signed)
diet controlled, Hgb a1c 5.8 08/11/23

## 2023-11-19 NOTE — Assessment & Plan Note (Signed)
01/05/22, no recurrence, delay cardiology

## 2023-11-19 NOTE — Assessment & Plan Note (Signed)
Back pain mid to lower portion, better, X-ray T/L spine showed multilevel degenerative changes, no acute findings CT head/C+T+L spine, and pelvis,  taking Tylenol, Tramadol             Bone spur R foot, callus of foot, f/u podiatrist.  

## 2023-11-19 NOTE — Assessment & Plan Note (Signed)
declined Alendronate after initial consented. DEXA if desires.

## 2023-11-19 NOTE — Progress Notes (Signed)
Location:   [facility] Room Number: 820 Place of Service:  [facility] (13)[facility] Provider: Chipper Oman NP  Code Status: DNR Goals of Care:     10/27/2023   10:27 AM  Advanced Directives  Does Patient Have a Medical Advance Directive? Yes  Type of Estate agent of [place];Out of facility DNR (pink MOST or yellow form);Living will  Does patient want to make changes to medical advance directive? No - Patient declined  Copy of Healthcare Power of Attorney in Chart? Yes - validated most recent copy scanned in chart (See row information)  Pre-existing out of facility DNR order (yellow form or pink MOST form) Pink MOST/Yellow Form most recent copy in chart - Physician notified to receive inpatient order     Chief Complaint  Patient presents with   Acute Visit    Possible uti   Medical Management of Chronic Issues    HPI: Patient is a 89 y.o. female seen today for medical management of chronic diseases.      ED eval 06/05/23 for fall, CT head/cervical spine, thoracic spine showed no acute fracture, she was found to have UTI even if the patient stated she is asymptomatic, urine culture: E coli >100,000c/ml, 7 day course of Keflex             ED eval 01/16/23 near syncope, fall, EKG, CT head/cervical spine, X-ray R knee, CBC, CMP unremarkable.             CT head/cervical spine, incidental finding: 6 mm right solid pulmonary nodule within the upper lobe. Per Fleischner Society Guidelines, recommend a non-contrast Chest CT at 6-12 months-ordered. pending CT chest wo CM. CXR 04/02/23 no acute process.              Edema BLE, not apparent, TED, Bun/creat 29/1.0 08/11/23             OSA, CPAP, seeing pulmonology.              A-flutter 01/05/22, no recurrence, delay cardiology OP declined Alendronate after initial consented.  Back pain mid to lower portion, better, X-ray T/L spine showed multilevel degenerative changes, no acute findings CT head/C+T+L spine, and pelvis,   taking Tylenol, Tramadol             Bone spur R foot, callus of foot, f/u podiatrist.              Allergic rhinitis, off Fluticasone nasal spray, Claritin, Atrovent nasal spray.              Anxiety, Lorazepam prn available to her.              Elevated TSH 5.07 06/11/22, TSH 5.11 02/10/23, TSH 3.49 08/11/23,  not on supplement.             HTN, dc'd Losartan by Dr. Maple Hudson 04/02/23? The patient is still taking Losartan 100mg  qd,  continue Metoprolol,  takes ASA, Bun/creat 29/1.0 08/11/23             GERD, stable on Omeprazole, Hgb 10.5 08/11/23             Hyperlipidemia, off Atorvastatin, LDL 70 06/11/22             Prediabetes, diet controlled, Hgb a1c 5.8 08/11/23     Past Medical History:  Diagnosis Date   GERD (gastroesophageal reflux disease)    Hx of cataract surgery    both eyes   Hyperglycemia  Hyperlipidemia    Hypertension    Lumbago 04/05/2016   Macular degeneration    ? which eye(s)   Obstructive sleep apnea    Osteoporosis, senile    Overactive bladder    Squamous cell skin cancer 2/16   left lower leg    Past Surgical History:  Procedure Laterality Date   ABDOMINAL HYSTERECTOMY  1960's   APPENDECTOMY  1960's   MOLE REMOVAL  2000   facial   TONSILLECTOMY      Allergies  Allergen Reactions   Adhesive [Tape]    Ampicillin    Elemental Sulfur    Nitrofuran Derivatives    Zoloft [Sertraline Hcl] Other (See Comments)    Dizziness, nausea, faint feeling, and nightmares      Allergies as of 11/19/2023       Reactions   Adhesive [tape]    Ampicillin    Elemental Sulfur    Nitrofuran Derivatives    Zoloft [sertraline Hcl] Other (See Comments)   Dizziness, nausea, faint feeling, and nightmares          Medication List        Accurate as of November 19, 2023 11:59 PM. If you have any questions, ask your nurse or doctor.          acetaminophen 500 MG tablet Commonly known as: TYLENOL Take 500 mg by mouth daily.   aspirin EC 81 MG tablet Take 1  tablet (81 mg total) by mouth daily.   Calcium Carb-Cholecalciferol 600-800 MG-UNIT Tabs Take 1 tablet by mouth 2 (two) times daily.   LORazepam 0.5 MG tablet Commonly known as: ATIVAN TAKE ONE TABLET ONCE DAILY AS NEEDED FOR ANXIETY.   losartan 100 MG tablet Commonly known as: COZAAR Take 100 mg by mouth daily.   metoprolol succinate 25 MG 24 hr tablet Commonly known as: TOPROL-XL TAKE 1/2 TABLET ONCE A DAY.   Ocuvite Adult 50+ Caps Take 1 capsule by mouth every evening.   omeprazole 40 MG capsule Commonly known as: PRILOSEC TAKE 1 CAPSULE DAILY 1/2 HOUR BEFORE BREAKFAST FOR STOMACH ACID.   Systane 0.4-0.3 % Gel ophthalmic gel Generic drug: Polyethyl Glycol-Propyl Glycol Place 1 application into both eyes daily.   traMADol 50 MG tablet Commonly known as: ULTRAM Take 1 tablet (50 mg total) by mouth every 6 (six) hours as needed.   TRIAMCINOLONE ACETONIDE(NASAL) NA Place 2 sprays into the nose. Each nostril        Review of Systems:  Review of Systems  Constitutional:  Negative for appetite change, fatigue and fever.  HENT:  Negative for congestion, hearing loss, trouble swallowing and voice change.   Eyes:  Negative for visual disturbance.  Respiratory:  Negative for cough and shortness of breath.   Cardiovascular:  Negative for leg swelling.  Gastrointestinal:  Negative for abdominal pain and constipation.       X1-2 times in the past 3 months, diet adjustment helped.   Genitourinary:  Negative for dysuria and urgency.       Worsened from occasionally urinary leakage to frequent urinary incontinence even if more at night.   Musculoskeletal:  Positive for arthralgias, back pain and gait problem.  Skin:  Negative for color change.  Neurological:  Negative for speech difficulty, weakness and light-headedness.       Tingling in toes sometimes, better after moves around.   Psychiatric/Behavioral:  Negative for confusion and sleep disturbance. The patient is not  nervous/anxious.     Health Maintenance  Topic Date Due  Zoster Vaccines- Shingrix (1 of 2) 12/29/2094 (Originally 01/24/1974)   COVID-19 Vaccine (6 - 2023-24 season) 12/09/2023   Medicare Annual Wellness (AWV)  10/26/2024   DTaP/Tdap/Td (3 - Td or Tdap) 11/04/2033   Pneumonia Vaccine 29+ Years old  Completed   INFLUENZA VACCINE  Completed   DEXA SCAN  Completed   HPV VACCINES  Aged Out    Physical Exam: Vitals:   11/19/23 1502  BP: (!) 160/75  Pulse: 70  Resp: 20  Temp: (!) 97.4 F (36.3 C)  SpO2: 97%  Weight: 160 lb 6.4 oz (72.8 kg)   Body mass index is 25.89 kg/m. Physical Exam Vitals and nursing note reviewed.  Constitutional:      Appearance: Normal appearance.  HENT:     Head: Normocephalic and atraumatic.     Mouth/Throat:     Mouth: Mucous membranes are moist.  Eyes:     Extraocular Movements: Extraocular movements intact.     Conjunctiva/sclera: Conjunctivae normal.     Pupils: Pupils are equal, round, and reactive to light.  Cardiovascular:     Rate and Rhythm: Normal rate and regular rhythm.     Heart sounds: No murmur heard. Pulmonary:     Effort: Pulmonary effort is normal.     Breath sounds: Normal breath sounds. No rales.  Abdominal:     General: Bowel sounds are normal.     Palpations: Abdomen is soft.     Tenderness: There is no abdominal tenderness.  Musculoskeletal:     Cervical back: Normal range of motion and neck supple.     Right lower leg: No edema.     Left lower leg: No edema.  Skin:    General: Skin is warm and dry.  Neurological:     General: No focal deficit present.     Mental Status: She is alert and oriented to person, place, and time. Mental status is at baseline.     Gait: Gait abnormal.  Psychiatric:        Mood and Affect: Mood normal.        Behavior: Behavior normal.        Thought Content: Thought content normal.        Judgment: Judgment normal.     Labs reviewed: Basic Metabolic Panel: Recent Labs     01/16/23 1941 02/10/23 0713 02/17/23 1105 06/05/23 1548 08/11/23 0000  NA 139  --  142 144 141  K 3.1*  --  4.9 4.5 4.2  CL 105  --  107 109 106  CO2 24  --  25 25 28*  GLUCOSE 105*  --  117* 92  --   BUN 29*  --  31* 20 29*  CREATININE 0.88  --  1.06* 0.84 1.0  CALCIUM 9.5  --  9.9 10.0 8.7  TSH  --  5.11*  --   --  3.49   Liver Function Tests: Recent Labs    01/16/23 1941 02/17/23 1105 06/05/23 1548 08/11/23 0000  AST 26 20 35 23  ALT 17 11 22 14   ALKPHOS 60  --  61 56  BILITOT 0.2* 0.5 0.9  --   PROT 6.1* 5.9* 5.8*  --   ALBUMIN 3.7  --  3.7 3.4*   No results for input(s): "LIPASE", "AMYLASE" in the last 8760 hours. No results for input(s): "AMMONIA" in the last 8760 hours. CBC: Recent Labs    01/16/23 1941 06/05/23 1548 08/11/23 0000  WBC 6.4 10.5 3.8  NEUTROABS 3.8  8.2* 1,915.00  HGB 11.5* 11.8* 10.5*  HCT 35.9* 37.0 32*  MCV 95.7 93.9  --   PLT 179 174 118*   Lipid Panel: No results for input(s): "CHOL", "HDL", "LDLCALC", "TRIG", "CHOLHDL", "LDLDIRECT" in the last 8760 hours. Lab Results  Component Value Date   HGBA1C 5.8 08/11/2023    Procedures since last visit: No results found.  Assessment/Plan  Atrial flutter (HCC) 01/05/22, no recurrence, delay cardiology  HTN (hypertension) Intermittent elevated systolic pressure, continue Losartan, Metoprolol  Osteopenia after menopause declined Alendronate after initial consented. DEXA if desires.   Osteoarthritis, multiple sites Back pain mid to lower portion, better, X-ray T/L spine showed multilevel degenerative changes, no acute findings CT head/C+T+L spine, and pelvis,  taking Tylenol, Tramadol             Bone spur R foot, callus of foot, f/u podiatrist.   Anxiety Lorazepam prn available to her.   GERD (gastroesophageal reflux disease) stable on Omeprazole, Hgb 10.5 08/11/23  Prediabetes  diet controlled, Hgb a1c 5.8 08/11/23     Labs/tests ordered: no  Next appt:  3 months

## 2023-11-19 NOTE — Assessment & Plan Note (Addendum)
Intermittent elevated systolic pressure, continue Losartan, Metoprolol

## 2023-11-19 NOTE — Assessment & Plan Note (Signed)
Lorazepam prn available to her.  

## 2023-11-23 ENCOUNTER — Encounter: Payer: Self-pay | Admitting: Nurse Practitioner

## 2023-12-01 DIAGNOSIS — R2681 Unsteadiness on feet: Secondary | ICD-10-CM | POA: Diagnosis not present

## 2023-12-01 DIAGNOSIS — M6281 Muscle weakness (generalized): Secondary | ICD-10-CM | POA: Diagnosis not present

## 2023-12-04 DIAGNOSIS — G4733 Obstructive sleep apnea (adult) (pediatric): Secondary | ICD-10-CM | POA: Diagnosis not present

## 2023-12-11 ENCOUNTER — Non-Acute Institutional Stay: Payer: Self-pay | Admitting: Nurse Practitioner

## 2023-12-11 ENCOUNTER — Encounter: Payer: Self-pay | Admitting: Nurse Practitioner

## 2023-12-11 DIAGNOSIS — R42 Dizziness and giddiness: Secondary | ICD-10-CM | POA: Diagnosis not present

## 2023-12-11 DIAGNOSIS — I10 Essential (primary) hypertension: Secondary | ICD-10-CM

## 2023-12-11 DIAGNOSIS — I4892 Unspecified atrial flutter: Secondary | ICD-10-CM

## 2023-12-11 DIAGNOSIS — K219 Gastro-esophageal reflux disease without esophagitis: Secondary | ICD-10-CM | POA: Diagnosis not present

## 2023-12-11 DIAGNOSIS — M150 Primary generalized (osteo)arthritis: Secondary | ICD-10-CM

## 2023-12-11 DIAGNOSIS — M542 Cervicalgia: Secondary | ICD-10-CM | POA: Diagnosis not present

## 2023-12-11 DIAGNOSIS — F419 Anxiety disorder, unspecified: Secondary | ICD-10-CM | POA: Diagnosis not present

## 2023-12-11 NOTE — Assessment & Plan Note (Signed)
stable on Omeprazole, Hgb 10.5 08/11/23

## 2023-12-11 NOTE — Assessment & Plan Note (Signed)
Back pain mid to lower portion, better, X-ray T/L spine showed multilevel degenerative changes, no acute findings CT head/C+T+L spine, and pelvis,  taking Tylenol, Tramadol

## 2023-12-11 NOTE — Progress Notes (Unsigned)
Location:   [facility] Room Number: 820 Place of Service:  ALF (13) Provider: Arna Snipe Lorie Melichar NP  Dennys Guin X, NP  Patient Care Team: Aadvika Konen X, NP as PCP - General (Internal Medicine) Masey Scheiber X, NP as Nurse Practitioner (Internal Medicine)  Extended Emergency Contact Information Primary Emergency Contact: [facility] Address: [address]          [place], [place] [address] D[place] of [place] Home Phone: [phone] Mobile Phone: [phone] Relation: Son  Code Status: DNR Goals of care: Advanced Directive information    10/27/2023   10:27 AM  Advanced Directives  Does Patient Have a Medical Advance Directive? Yes  Type of Estate agent of [place];Out of facility DNR (pink MOST or yellow form);Living will  Does patient want to make changes to medical advance directive? No - Patient declined  Copy of Healthcare Power of Attorney in Chart? Yes - validated most recent copy scanned in chart (See row information)  Pre-existing out of facility DNR order (yellow form or pink MOST form) Pink MOST/Yellow Form most recent copy in chart - Physician notified to receive inpatient order     Chief Complaint  Patient presents with  . Acute Visit    Dizziness episode last night.     HPI:  Pt is a 89 y.o. female seen today for an acute visit for reported feeling dizzy 12/10/23 while standing in front of her window decorating her christmas tree, resolved after sitting down. Denied further dizziness, change of vision, chest pain, palpitation, SOB, nausea, vomiting, abd pain, or dysuria. No noted focal weakness upon my visit today.      ED eval 06/05/23 for fall, CT head/cervical spine, thoracic spine showed no acute fracture, she was found to have UTI even if the patient stated she is asymptomatic, urine culture: E coli >100,000c/ml, 7 day course of Keflex             ED eval 01/16/23 near syncope, fall, EKG, CT head/cervical spine, X-ray R knee, CBC, CMP  unremarkable.             CT head/cervical spine, incidental finding: 6 mm right solid pulmonary nodule within the upper lobe. Per Fleischner Society Guidelines, recommend a non-contrast Chest CT at 6-12 months-ordered. pending CT chest wo CM. CXR 04/02/23 no acute process.              Edema BLE, not apparent, TED, Bun/creat 29/1.0 08/11/23             OSA, CPAP, seeing pulmonology.              A-flutter 01/05/22, no recurrence, delay cardiology OP declined Alendronate after initial consented.  Back pain mid to lower portion, better, X-ray T/L spine showed multilevel degenerative changes, no acute findings CT head/C+T+L spine, and pelvis,  taking Tylenol, Tramadol             Bone spur R foot, callus of foot, f/u podiatrist.              Allergic rhinitis, off Fluticasone nasal spray, Claritin, Atrovent nasal spray.              Anxiety, Lorazepam prn available to her.              Elevated TSH 5.07 06/11/22, TSH 5.11 02/10/23, TSH 3.49 08/11/23,  not on supplement.             HTN, dc'd Losartan by Dr. Maple Hudson 04/02/23? The  patient is still taking Losartan 100mg  qd,  continue Metoprolol,  takes ASA, Bun/creat 29/1.0 08/11/23             GERD, stable on Omeprazole, Hgb 10.5 08/11/23             Hyperlipidemia, off Atorvastatin, LDL 70 06/11/22             Prediabetes, diet controlled, Hgb a1c 5.8 08/11/23                  Past Medical History:  Diagnosis Date  . GERD (gastroesophageal reflux disease)   . Hx of cataract surgery    both eyes  . Hyperglycemia   . Hyperlipidemia   . Hypertension   . Lumbago 04/05/2016  . Macular degeneration    ? which eye(s)  . Obstructive sleep apnea   . Osteoporosis, senile   . Overactive bladder   . Squamous cell skin cancer 2/16   left lower leg   Past Surgical History:  Procedure Laterality Date  . ABDOMINAL HYSTERECTOMY  1960's  . APPENDECTOMY  1960's  . MOLE REMOVAL  2000   facial  . TONSILLECTOMY      Allergies  Allergen Reactions  . Adhesive  [Tape]   . Ampicillin   . Elemental Sulfur   . Nitrofuran Derivatives   . Zoloft [Sertraline Hcl] Other (See Comments)    Dizziness, nausea, faint feeling, and nightmares      Allergies as of 12/11/2023       Reactions   Adhesive [tape]    Ampicillin    Elemental Sulfur    Nitrofuran Derivatives    Zoloft [sertraline Hcl] Other (See Comments)   Dizziness, nausea, faint feeling, and nightmares          Medication List        Accurate as of December 11, 2023  3:31 PM. If you have any questions, ask your nurse or doctor.          acetaminophen 500 MG tablet Commonly known as: TYLENOL Take 500 mg by mouth daily.   aspirin EC 81 MG tablet Take 1 tablet (81 mg total) by mouth daily.   Calcium Carb-Cholecalciferol 600-800 MG-UNIT Tabs Take 1 tablet by mouth 2 (two) times daily.   LORazepam 0.5 MG tablet Commonly known as: ATIVAN TAKE ONE TABLET ONCE DAILY AS NEEDED FOR ANXIETY.   losartan 100 MG tablet Commonly known as: COZAAR Take 100 mg by mouth daily.   metoprolol succinate 25 MG 24 hr tablet Commonly known as: TOPROL-XL TAKE 1/2 TABLET ONCE A DAY.   Ocuvite Adult 50+ Caps Take 1 capsule by mouth every evening.   omeprazole 40 MG capsule Commonly known as: PRILOSEC TAKE 1 CAPSULE DAILY 1/2 HOUR BEFORE BREAKFAST FOR STOMACH ACID.   Systane 0.4-0.3 % Gel ophthalmic gel Generic drug: Polyethyl Glycol-Propyl Glycol Place 1 application into both eyes daily.   traMADol 50 MG tablet Commonly known as: ULTRAM Take 1 tablet (50 mg total) by mouth every 6 (six) hours as needed.   TRIAMCINOLONE ACETONIDE(NASAL) NA Place 2 sprays into the nose. Each nostril        Review of Systems  Constitutional:  Negative for appetite change, fatigue and fever.  HENT:  Negative for congestion, hearing loss, trouble swallowing and voice change.   Eyes:  Negative for visual disturbance.  Respiratory:  Negative for cough and shortness of breath.   Cardiovascular:   Negative for leg swelling.  Gastrointestinal:  Negative for abdominal pain  and constipation.       X1-2 times in the past 3 months, diet adjustment helped.   Genitourinary:  Negative for dysuria and urgency.       Worsened from occasionally urinary leakage to frequent urinary incontinence even if more at night.   Musculoskeletal:  Positive for arthralgias, back pain and gait problem.  Skin:  Negative for color change.  Neurological:  Negative for speech difficulty, weakness and light-headedness.       Tingling in toes sometimes, better after moves around.   Psychiatric/Behavioral:  Negative for confusion and sleep disturbance. The patient is not nervous/anxious.     Immunization History  Administered Date(s) Administered  . Fluad Quad(high Dose 65+) 10/12/2019, 10/14/2022  . Influenza Whole 09/30/2018  . Influenza, High Dose Seasonal PF 10/07/2017, 09/28/2021  . Influenza-Unspecified 09/27/2015, 10/09/2016, 10/10/2020, 09/30/2023  . Moderna Covid-19 Vaccine Bivalent Booster 40yrs & up 10/30/2022  . Moderna SARS-COV2 Booster Vaccination 10/14/2023  . Moderna Sars-Covid-2 Vaccination 01/02/2020, 01/30/2020, 11/06/2020  . Pfizer Fall 2023 Covid-19 Vaccine 6mos thru 56yrs  05/16/2022  . Pneumococcal Conjugate-13 01/23/2015  . Pneumococcal Polysaccharide-23 10/20/2007  . Respiratory Syncytial Virus Vaccine,Recomb Aduvanted(Arexvy) 12/26/2022  . Td 12/14/2012  . Tdap 11/05/2023   Pertinent  Health Maintenance Due  Topic Date Due  . INFLUENZA VACCINE  Completed  . DEXA SCAN  Completed      03/05/2023    2:48 PM 03/12/2023    3:32 PM 04/23/2023    1:49 PM 05/20/2023    2:12 PM 08/14/2023   12:41 PM  Fall Risk  Falls in the past year? 0 0 0 0 0  Was there an injury with Fall? 0 0 0 0 0  Fall Risk Category Calculator 0 0 0 0 0  Patient at Risk for Falls Due to No Fall Risks Impaired balance/gait;Impaired mobility History of fall(s) History of fall(s)   Fall risk Follow up Falls evaluation  completed Falls evaluation completed Falls evaluation completed Falls evaluation completed Falls evaluation completed   Functional Status Survey:    Vitals:   12/11/23 1139  BP: (!) 156/78  Pulse: 69  Resp: 20  Temp: 97.7 F (36.5 C)  SpO2: 97%  Weight: 160 lb (72.6 kg)   Body mass index is 25.82 kg/m. Physical Exam Vitals and nursing note reviewed.  Constitutional:      Appearance: Normal appearance.  HENT:     Head: Normocephalic and atraumatic.     Mouth/Throat:     Mouth: Mucous membranes are moist.  Eyes:     Extraocular Movements: Extraocular movements intact.     Conjunctiva/sclera: Conjunctivae normal.     Pupils: Pupils are equal, round, and reactive to light.  Cardiovascular:     Rate and Rhythm: Normal rate and regular rhythm.     Heart sounds: No murmur heard. Pulmonary:     Effort: Pulmonary effort is normal.     Breath sounds: Normal breath sounds. No rales.  Abdominal:     General: Bowel sounds are normal.     Palpations: Abdomen is soft.     Tenderness: There is no abdominal tenderness.  Musculoskeletal:     Cervical back: Normal range of motion and neck supple.     Right lower leg: No edema.     Left lower leg: No edema.  Skin:    General: Skin is warm and dry.  Neurological:     General: No focal deficit present.     Mental Status: She is alert and oriented  to person, place, and time. Mental status is at baseline.     Gait: Gait abnormal.  Psychiatric:        Mood and Affect: Mood normal.        Behavior: Behavior normal.        Thought Content: Thought content normal.        Judgment: Judgment normal.    Labs reviewed: Recent Labs    01/16/23 1941 02/17/23 1105 06/05/23 1548 08/11/23 0000  NA 139 142 144 141  K 3.1* 4.9 4.5 4.2  CL 105 107 109 106  CO2 24 25 25  28*  GLUCOSE 105* 117* 92  --   BUN 29* 31* 20 29*  CREATININE 0.88 1.06* 0.84 1.0  CALCIUM 9.5 9.9 10.0 8.7   Recent Labs    01/16/23 1941 02/17/23 1105  06/05/23 1548 08/11/23 0000  AST 26 20 35 23  ALT 17 11 22 14   ALKPHOS 60  --  61 56  BILITOT 0.2* 0.5 0.9  --   PROT 6.1* 5.9* 5.8*  --   ALBUMIN 3.7  --  3.7 3.4*   Recent Labs    01/16/23 1941 06/05/23 1548 08/11/23 0000  WBC 6.4 10.5 3.8  NEUTROABS 3.8 8.2* 1,915.00  HGB 11.5* 11.8* 10.5*  HCT 35.9* 37.0 32*  MCV 95.7 93.9  --   PLT 179 174 118*   Lab Results  Component Value Date   TSH 3.49 08/11/2023   Lab Results  Component Value Date   HGBA1C 5.8 08/11/2023   Lab Results  Component Value Date   CHOL 139 06/11/2022   HDL 56 06/11/2022   LDLCALC 70 06/11/2022   TRIG 57 06/11/2022   CHOLHDL 2.5 06/11/2022    Significant Diagnostic Results in last 30 days:  No results found.  Assessment/Plan: Episode of dizziness  reported feeling dizzy 12/10/23 while standing in front of her window decorating her christmas tree, resolved after sitting down. Denied further dizziness, change of vision, chest pain, palpitation, SOB, nausea, vomiting, abd pain, or dysuria. No noted focal weakness upon my visit today.   CT head/cervical spine if HPOA consent.  From 03/12/23 still c/o light headed at times, positional. Obtain ortho Bps-no significant Bp change, simplify meds: dc Atorvastatin, Loratadine, HCTZ   EKG: SR vent rate 64, no significant ST-T changes                          ED eval 01/16/23 near syncope, fall, EKG, CT head/cervical spine, X-ray R knee, CBC, CMP unremarkable.                          CT head/cervical spine, incidental finding: 6 mm right solid pulmonary nodule within the upper lobe. Per Fleischner Society Guidelines, recommend a non-contrast Chest CT at 6-12 months-ordered.   Atrial flutter (HCC)  01/05/22, no recurrence, delay cardiology  Osteoarthritis, multiple sites Back pain mid to lower portion, better, X-ray T/L spine showed multilevel degenerative changes, no acute findings CT head/C+T+L spine, and pelvis,  taking Tylenol,  Tramadol  Anxiety Lorazepam prn available to her.   HTN (hypertension) Blood pressure is controlled,  dc'd Losartan by Dr. Maple Hudson 04/02/23? The patient is still taking Losartan 100mg  qd,  continue Metoprolol,  takes ASA, Bun/creat 29/1.0 08/11/23  GERD (gastroesophageal reflux disease) stable on Omeprazole, Hgb 10.5 08/11/23    Family/ staff Communication: plan of care reviewed with the patient and charge nurse.  Labs/tests ordered: CBC/diff, CMP/eGFR  Time spend 40 minutes.

## 2023-12-11 NOTE — Assessment & Plan Note (Signed)
reported feeling dizzy 12/10/23 while standing in front of her window decorating her christmas tree, resolved after sitting down. Denied further dizziness, change of vision, chest pain, palpitation, SOB, nausea, vomiting, abd pain, or dysuria. No noted focal weakness upon my visit today.   CT head/cervical spine if HPOA consent.  From 03/12/23 still c/o light headed at times, positional. Obtain ortho Bps-no significant Bp change, simplify meds: dc Atorvastatin, Loratadine, HCTZ   EKG: SR vent rate 64, no significant ST-T changes                          ED eval 01/16/23 near syncope, fall, EKG, CT head/cervical spine, X-ray R knee, CBC, CMP unremarkable.                          CT head/cervical spine, incidental finding: 6 mm right solid pulmonary nodule within the upper lobe. Per Fleischner Society Guidelines, recommend a non-contrast Chest CT at 6-12 months-ordered.

## 2023-12-11 NOTE — Assessment & Plan Note (Signed)
Blood pressure is loose controlled,  dc'd Losartan by Dr. Maple Hudson 04/02/23? The patient is still taking Losartan 100mg  qd,  continue Metoprolol,  takes ASA, Bun/creat 29/1.0 08/11/23

## 2023-12-11 NOTE — Assessment & Plan Note (Signed)
Lorazepam prn available to her.  

## 2023-12-11 NOTE — Assessment & Plan Note (Signed)
01/05/22, no recurrence, delay cardiology

## 2023-12-14 ENCOUNTER — Non-Acute Institutional Stay: Payer: Self-pay | Admitting: Nurse Practitioner

## 2023-12-14 ENCOUNTER — Encounter: Payer: Self-pay | Admitting: Nurse Practitioner

## 2023-12-14 DIAGNOSIS — K219 Gastro-esophageal reflux disease without esophagitis: Secondary | ICD-10-CM

## 2023-12-14 DIAGNOSIS — F419 Anxiety disorder, unspecified: Secondary | ICD-10-CM | POA: Diagnosis not present

## 2023-12-14 DIAGNOSIS — R42 Dizziness and giddiness: Secondary | ICD-10-CM

## 2023-12-14 DIAGNOSIS — K2900 Acute gastritis without bleeding: Secondary | ICD-10-CM | POA: Diagnosis not present

## 2023-12-14 DIAGNOSIS — I10 Essential (primary) hypertension: Secondary | ICD-10-CM

## 2023-12-14 NOTE — Assessment & Plan Note (Signed)
Lorazepam prn available to her.  

## 2023-12-14 NOTE — Progress Notes (Signed)
Location:   [facility] Room Number: 820 Place of Service:  ALF (13) Provider: Arna Snipe Manroop Jakubowicz NP  Piercen Covino X, NP  Patient Care Team: Lashawnda Hancox X, NP as PCP - General (Internal Medicine) Kacia Halley X, NP as Nurse Practitioner (Internal Medicine)  Extended Emergency Contact Information Primary Emergency Contact: [facility] Address: [address]          [place], [place] [address] Darden [place] of [place] Home Phone: [phone] Mobile Phone: [phone] Relation: Son  Code Status: DNR Goals of care: Advanced Directive information    10/27/2023   10:27 AM  Advanced Directives  Does Patient Have a Medical Advance Directive? Yes  Type of Estate agent of [place];Out of facility DNR (pink MOST or yellow form);Living will  Does patient want to make changes to medical advance directive? No - Patient declined  Copy of Healthcare Power of Attorney in Chart? Yes - validated most recent copy scanned in chart (See row information)  Pre-existing out of facility DNR order (yellow form or pink MOST form) Pink MOST/Yellow Form most recent copy in chart - Physician notified to receive inpatient order     Chief Complaint  Patient presents with  . Acute Visit    Nausea, vomiting, diarrhea    HPI:  Pt is a 89 y.o. female seen today for an acute visit for nausea, vomiting, diarrhea x 1 days, poor appetite and generalized weakness noted, denied abd pain. She is afebrile.   Near syncope: not new, no orthostatic hypotension, last episode of dizziness 12/10/23   ED eval 06/05/23 for fall, CT head/cervical spine, thoracic spine showed no acute fracture, she was found to have UTI even if the patient stated she is asymptomatic, urine culture: E coli >100,000c/ml, 7 day course of Keflex             ED eval 01/16/23 near syncope, fall, EKG, CT head/cervical spine, X-ray R knee, CBC, CMP unremarkable.             CT head/cervical spine, incidental finding: 6 mm right solid  pulmonary nodule within the upper lobe. Per Fleischner Society Guidelines, recommend a non-contrast Chest CT at 6-12 months-ordered. pending CT chest wo CM. CXR 04/02/23 no acute process.              Edema BLE, not apparent, TED             OSA, CPAP, seeing pulmonology.              A-flutter 01/05/22, no recurrence, delay cardiology OP declined Alendronate after initial consented.  Back pain mid to lower portion, better, X-ray T/L spine showed multilevel degenerative changes, no acute findings CT head/C+T+L spine, and pelvis,  taking Tylenol, Tramadol             Bone spur R foot, callus of foot, f/u podiatrist.              Allergic rhinitis, off Fluticasone nasal spray, Claritin, Atrovent nasal spray.              Anxiety, Lorazepam prn available to her.              Elevated TSH 5.07 06/11/22, TSH 5.11 02/10/23, TSH 3.49 08/11/23,  not on supplement.             HTN, dc'd Losartan by Dr. Maple Hudson 04/02/23? The patient is still taking Losartan 100mg  qd,  continue Metoprolol,  takes ASA, Bun/creat 24/0.96 12/11/23  GERD, stable on Omeprazole, Hgb 12.1 12/11/23             Hyperlipidemia, off Atorvastatin, LDL 70 06/11/22             Prediabetes, diet controlled, Hgb a1c 5.8 08/11/23               Past Medical History:  Diagnosis Date  . GERD (gastroesophageal reflux disease)   . Hx of cataract surgery    both eyes  . Hyperglycemia   . Hyperlipidemia   . Hypertension   . Lumbago 04/05/2016  . Macular degeneration    ? which eye(s)  . Obstructive sleep apnea   . Osteoporosis, senile   . Overactive bladder   . Squamous cell skin cancer 2/16   left lower leg   Past Surgical History:  Procedure Laterality Date  . ABDOMINAL HYSTERECTOMY  1960's  . APPENDECTOMY  1960's  . MOLE REMOVAL  2000   facial  . TONSILLECTOMY      Allergies  Allergen Reactions  . Adhesive [Tape]   . Ampicillin   . Elemental Sulfur   . Nitrofuran Derivatives   . Zoloft [Sertraline Hcl] Other (See  Comments)    Dizziness, nausea, faint feeling, and nightmares      Allergies as of 12/14/2023       Reactions   Adhesive [tape]    Ampicillin    Elemental Sulfur    Nitrofuran Derivatives    Zoloft [sertraline Hcl] Other (See Comments)   Dizziness, nausea, faint feeling, and nightmares          Medication List        Accurate as of December 14, 2023 11:59 PM. If you have any questions, ask your nurse or doctor.          acetaminophen 500 MG tablet Commonly known as: TYLENOL Take 500 mg by mouth daily.   aspirin EC 81 MG tablet Take 1 tablet (81 mg total) by mouth daily.   Calcium Carb-Cholecalciferol 600-800 MG-UNIT Tabs Take 1 tablet by mouth 2 (two) times daily.   LORazepam 0.5 MG tablet Commonly known as: ATIVAN TAKE ONE TABLET ONCE DAILY AS NEEDED FOR ANXIETY.   losartan 100 MG tablet Commonly known as: COZAAR Take 100 mg by mouth daily.   metoprolol succinate 25 MG 24 hr tablet Commonly known as: TOPROL-XL TAKE 1/2 TABLET ONCE A DAY.   Ocuvite Adult 50+ Caps Take 1 capsule by mouth every evening.   omeprazole 40 MG capsule Commonly known as: PRILOSEC TAKE 1 CAPSULE DAILY 1/2 HOUR BEFORE BREAKFAST FOR STOMACH ACID.   Systane 0.4-0.3 % Gel ophthalmic gel Generic drug: Polyethyl Glycol-Propyl Glycol Place 1 application into both eyes daily.   traMADol 50 MG tablet Commonly known as: ULTRAM Take 1 tablet (50 mg total) by mouth every 6 (six) hours as needed.   TRIAMCINOLONE ACETONIDE(NASAL) NA Place 2 sprays into the nose. Each nostril        Review of Systems  Constitutional:  Positive for appetite change and fatigue. Negative for fever.  HENT:  Negative for congestion, hearing loss, trouble swallowing and voice change.   Eyes:  Negative for visual disturbance.  Respiratory:  Negative for cough and shortness of breath.   Cardiovascular:  Negative for leg swelling.  Gastrointestinal:  Negative for abdominal pain, blood in stool, diarrhea,  nausea and vomiting.       X1-2 times in the past 3 months, diet adjustment helped.   Genitourinary:  Negative for dysuria  and urgency.       Worsened from occasionally urinary leakage to frequent urinary incontinence even if more at night.   Musculoskeletal:  Positive for arthralgias, back pain and gait problem.  Skin:  Negative for color change.  Neurological:  Negative for speech difficulty, weakness and light-headedness.       Tingling in toes sometimes, better after moves around.   Psychiatric/Behavioral:  Negative for confusion and sleep disturbance. The patient is not nervous/anxious.     Immunization History  Administered Date(s) Administered  . Fluad Quad(high Dose 65+) 10/12/2019, 10/14/2022  . Influenza Whole 09/30/2018  . Influenza, High Dose Seasonal PF 10/07/2017, 09/28/2021  . Influenza-Unspecified 09/27/2015, 10/09/2016, 10/10/2020, 09/30/2023  . Moderna Covid-19 Vaccine Bivalent Booster 52yrs & up 10/30/2022  . Moderna SARS-COV2 Booster Vaccination 10/14/2023  . Moderna Sars-Covid-2 Vaccination 01/02/2020, 01/30/2020, 11/06/2020  . Pfizer Fall 2023 Covid-19 Vaccine 6mos thru 40yrs  05/16/2022  . Pneumococcal Conjugate-13 01/23/2015  . Pneumococcal Polysaccharide-23 10/20/2007  . Respiratory Syncytial Virus Vaccine,Recomb Aduvanted(Arexvy) 12/26/2022  . Td 12/14/2012  . Tdap 11/05/2023   Pertinent  Health Maintenance Due  Topic Date Due  . INFLUENZA VACCINE  Completed  . DEXA SCAN  Completed      03/05/2023    2:48 PM 03/12/2023    3:32 PM 04/23/2023    1:49 PM 05/20/2023    2:12 PM 08/14/2023   12:41 PM  Fall Risk  Falls in the past year? 0 0 0 0 0  Was there an injury with Fall? 0 0 0 0 0  Fall Risk Category Calculator 0 0 0 0 0  Patient at Risk for Falls Due to No Fall Risks Impaired balance/gait;Impaired mobility History of fall(s) History of fall(s)   Fall risk Follow up Falls evaluation completed Falls evaluation completed Falls evaluation completed Falls  evaluation completed Falls evaluation completed   Functional Status Survey:    Vitals:   12/14/23 1111  BP: (!) 140/78  Pulse: 69  Resp: 20  Temp: 98 F (36.7 C)  SpO2: 97%  Weight: 160 lb 3.2 oz (72.7 kg)   Body mass index is 25.86 kg/m. Physical Exam Vitals and nursing note reviewed.  Constitutional:      Comments: Tired appearance.   HENT:     Head: Normocephalic and atraumatic.     Mouth/Throat:     Mouth: Mucous membranes are moist.  Eyes:     Extraocular Movements: Extraocular movements intact.     Conjunctiva/sclera: Conjunctivae normal.     Pupils: Pupils are equal, round, and reactive to light.  Cardiovascular:     Rate and Rhythm: Normal rate and regular rhythm.     Heart sounds: No murmur heard. Pulmonary:     Effort: Pulmonary effort is normal.     Breath sounds: Normal breath sounds. No rales.  Abdominal:     General: Bowel sounds are normal.     Palpations: Abdomen is soft.     Tenderness: There is no abdominal tenderness. There is no right CVA tenderness, left CVA tenderness, guarding or rebound.  Musculoskeletal:     Cervical back: Normal range of motion and neck supple.     Right lower leg: No edema.     Left lower leg: No edema.  Skin:    General: Skin is warm and dry.  Neurological:     General: No focal deficit present.     Mental Status: She is alert and oriented to person, place, and time. Mental status is at baseline.  Gait: Gait abnormal.  Psychiatric:        Mood and Affect: Mood normal.        Behavior: Behavior normal.        Thought Content: Thought content normal.        Judgment: Judgment normal.    Labs reviewed: Recent Labs    01/16/23 1941 02/17/23 1105 06/05/23 1548 08/11/23 0000  NA 139 142 144 141  K 3.1* 4.9 4.5 4.2  CL 105 107 109 106  CO2 24 25 25  28*  GLUCOSE 105* 117* 92  --   BUN 29* 31* 20 29*  CREATININE 0.88 1.06* 0.84 1.0  CALCIUM 9.5 9.9 10.0 8.7   Recent Labs    01/16/23 1941 02/17/23 1105  06/05/23 1548 08/11/23 0000  AST 26 20 35 23  ALT 17 11 22 14   ALKPHOS 60  --  61 56  BILITOT 0.2* 0.5 0.9  --   PROT 6.1* 5.9* 5.8*  --   ALBUMIN 3.7  --  3.7 3.4*   Recent Labs    01/16/23 1941 06/05/23 1548 08/11/23 0000  WBC 6.4 10.5 3.8  NEUTROABS 3.8 8.2* 1,915.00  HGB 11.5* 11.8* 10.5*  HCT 35.9* 37.0 32*  MCV 95.7 93.9  --   PLT 179 174 118*   Lab Results  Component Value Date   TSH 3.49 08/11/2023   Lab Results  Component Value Date   HGBA1C 5.8 08/11/2023   Lab Results  Component Value Date   CHOL 139 06/11/2022   HDL 56 06/11/2022   LDLCALC 70 06/11/2022   TRIG 57 06/11/2022   CHOLHDL 2.5 06/11/2022    Significant Diagnostic Results in last 30 days:  No results found.  Assessment/Plan: Acute gastritis  nausea, vomiting, diarrhea x 1 days, poor appetite and generalized weakness noted,  denied abd pain. She is afebrile.  COVID test May obtain Lipase, CBC/diff, C-diff toxin A/B x3 if GI symptoms persist Zofran 4mg  q6hr prn x 24 hours, observe.  VS every day x 72hrs  Episode of dizziness not new, no orthostatic hypotension, last episode of dizziness 12/10/23 ED eval 01/16/23 near syncope, fall, EKG, CT head/cervical spine, X-ray R knee, CBC, CMP unremarkable.  Anxiety Lorazepam prn available to her.   HTN (hypertension) Loose Bp control,  dc'd Losartan by Dr. Maple Hudson 04/02/23? The patient is still taking Losartan 100mg  qd,  continue Metoprolol,  takes ASA, Bun/creat 24/0.96 12/11/23  GERD (gastroesophageal reflux disease) stable on Omeprazole, Hgb 12.1 12/11/23    Family/ staff Communication: plan of care reviewed with the patient and charge nurse.   Labs/tests ordered:  none  Time spend 40 minutes.

## 2023-12-14 NOTE — Assessment & Plan Note (Signed)
not new, no orthostatic hypotension, last episode of dizziness 12/10/23 ED eval 01/16/23 near syncope, fall, EKG, CT head/cervical spine, X-ray R knee, CBC, CMP unremarkable.

## 2023-12-14 NOTE — Assessment & Plan Note (Signed)
stable on Omeprazole, Hgb 12.1 12/11/23

## 2023-12-14 NOTE — Assessment & Plan Note (Addendum)
nausea, vomiting, diarrhea x 1 days, poor appetite and generalized weakness noted,  denied abd pain. She is afebrile.  COVID test May obtain Lipase, CBC/diff, C-diff toxin A/B x3 if GI symptoms persist Zofran 4mg  q6hr prn x 24 hours, observe.  VS every day x 72hrs

## 2023-12-14 NOTE — Assessment & Plan Note (Signed)
Loose Bp control,  dc'd Losartan by Dr. Maple Hudson 04/02/23? The patient is still taking Losartan 100mg  qd,  continue Metoprolol,  takes ASA, Bun/creat 24/0.96 12/11/23

## 2023-12-17 ENCOUNTER — Other Ambulatory Visit: Payer: Self-pay | Admitting: Nurse Practitioner

## 2023-12-17 DIAGNOSIS — R42 Dizziness and giddiness: Secondary | ICD-10-CM

## 2023-12-18 ENCOUNTER — Ambulatory Visit (HOSPITAL_COMMUNITY): Payer: PPO

## 2023-12-18 ENCOUNTER — Ambulatory Visit (HOSPITAL_COMMUNITY)
Admission: RE | Admit: 2023-12-18 | Discharge: 2023-12-18 | Disposition: A | Discharge status: Home / Self Care | Payer: PPO | Source: Ambulatory Visit | Attending: Nurse Practitioner | Admitting: Nurse Practitioner

## 2023-12-18 DIAGNOSIS — M4722 Other spondylosis with radiculopathy, cervical region: Secondary | ICD-10-CM | POA: Diagnosis not present

## 2023-12-18 DIAGNOSIS — R42 Dizziness and giddiness: Secondary | ICD-10-CM | POA: Insufficient documentation

## 2023-12-18 DIAGNOSIS — R9082 White matter disease, unspecified: Secondary | ICD-10-CM | POA: Diagnosis not present

## 2023-12-18 DIAGNOSIS — M4312 Spondylolisthesis, cervical region: Secondary | ICD-10-CM | POA: Diagnosis not present

## 2023-12-18 DIAGNOSIS — M4802 Spinal stenosis, cervical region: Secondary | ICD-10-CM | POA: Diagnosis not present

## 2023-12-22 DIAGNOSIS — G4733 Obstructive sleep apnea (adult) (pediatric): Secondary | ICD-10-CM | POA: Diagnosis not present

## 2024-01-01 DIAGNOSIS — M6281 Muscle weakness (generalized): Secondary | ICD-10-CM | POA: Diagnosis not present

## 2024-01-01 DIAGNOSIS — R2681 Unsteadiness on feet: Secondary | ICD-10-CM | POA: Diagnosis not present

## 2024-01-04 DIAGNOSIS — G4733 Obstructive sleep apnea (adult) (pediatric): Secondary | ICD-10-CM | POA: Diagnosis not present

## 2024-02-01 DIAGNOSIS — R2681 Unsteadiness on feet: Secondary | ICD-10-CM | POA: Diagnosis not present

## 2024-02-01 DIAGNOSIS — M6281 Muscle weakness (generalized): Secondary | ICD-10-CM | POA: Diagnosis not present

## 2024-02-04 DIAGNOSIS — G4733 Obstructive sleep apnea (adult) (pediatric): Secondary | ICD-10-CM | POA: Diagnosis not present

## 2024-02-29 DIAGNOSIS — M6281 Muscle weakness (generalized): Secondary | ICD-10-CM | POA: Diagnosis not present

## 2024-02-29 DIAGNOSIS — R2681 Unsteadiness on feet: Secondary | ICD-10-CM | POA: Diagnosis not present

## 2024-03-03 DIAGNOSIS — G4733 Obstructive sleep apnea (adult) (pediatric): Secondary | ICD-10-CM | POA: Diagnosis not present

## 2024-03-04 ENCOUNTER — Encounter: Payer: Self-pay | Admitting: Nurse Practitioner

## 2024-03-04 ENCOUNTER — Non-Acute Institutional Stay: Payer: Self-pay | Admitting: Nurse Practitioner

## 2024-03-04 DIAGNOSIS — I10 Essential (primary) hypertension: Secondary | ICD-10-CM | POA: Diagnosis not present

## 2024-03-04 DIAGNOSIS — M8588 Other specified disorders of bone density and structure, other site: Secondary | ICD-10-CM | POA: Diagnosis not present

## 2024-03-04 DIAGNOSIS — M150 Primary generalized (osteo)arthritis: Secondary | ICD-10-CM

## 2024-03-04 DIAGNOSIS — F419 Anxiety disorder, unspecified: Secondary | ICD-10-CM

## 2024-03-04 DIAGNOSIS — K219 Gastro-esophageal reflux disease without esophagitis: Secondary | ICD-10-CM | POA: Diagnosis not present

## 2024-03-04 DIAGNOSIS — I4892 Unspecified atrial flutter: Secondary | ICD-10-CM

## 2024-03-04 DIAGNOSIS — R531 Weakness: Secondary | ICD-10-CM

## 2024-03-04 DIAGNOSIS — M47814 Spondylosis without myelopathy or radiculopathy, thoracic region: Secondary | ICD-10-CM | POA: Diagnosis not present

## 2024-03-04 DIAGNOSIS — R42 Dizziness and giddiness: Secondary | ICD-10-CM | POA: Diagnosis not present

## 2024-03-04 DIAGNOSIS — R296 Repeated falls: Secondary | ICD-10-CM | POA: Insufficient documentation

## 2024-03-04 DIAGNOSIS — M47812 Spondylosis without myelopathy or radiculopathy, cervical region: Secondary | ICD-10-CM | POA: Diagnosis not present

## 2024-03-04 NOTE — Assessment & Plan Note (Signed)
 stable on Omeprazole, Hgb 12.1 12/11/23

## 2024-03-04 NOTE — Assessment & Plan Note (Addendum)
 C/o pain in the back of lower neck for a week, no injury, C7 spinal spinous process tenderness palpated X-ray cervical spine, thoracic spine 03/04/24 Xray T spine: no subluxation or compression fx. 03/04/24 Xray C spine: no compression fx Tramadol 25mg  bid

## 2024-03-04 NOTE — Assessment & Plan Note (Signed)
 no recurrence, delay cardiology

## 2024-03-04 NOTE — Assessment & Plan Note (Addendum)
 Ambulated with walker in room upon my visit today No further c/o dizziness, chills, nausea, or abd pain CBC/diff, CMP/eGFR 03/04/24 Wbc 6.4, Hgb 10.6, neutrophils 68.2%, Na 139, K 4, Bun 20, creat 0.94

## 2024-03-04 NOTE — Progress Notes (Signed)
 Location:   [facility] Room Number: 70 Place of Service:  ALF (13) Provider: Arna Snipe Tajanay Hurley NP  Laithan Conchas X, NP  Patient Care Team: Morrie Daywalt X, NP as PCP - General (Internal Medicine) Wael Maestas X, NP as Nurse Practitioner (Internal Medicine)  Extended Emergency Contact Information Primary Emergency Contact: [facility] Address: [address]          [place], [place] [address] Darden Amber of [place] Home Phone: [phone] Mobile Phone: [phone] Relation: Son  Code Status: DNR Goals of care: Advanced Directive information    10/27/2023   10:27 AM  Advanced Directives  Does Patient Have a Medical Advance Directive? Yes  Type of Estate agent of [place];Out of facility DNR (pink MOST or yellow form);Living will  Does patient want to make changes to medical advance directive? No - Patient declined  Copy of Healthcare Power of Attorney in Chart? Yes - validated most recent copy scanned in chart (See row information)  Pre-existing out of facility DNR order (yellow form or pink MOST form) Pink MOST/Yellow Form most recent copy in chart - Physician notified to receive inpatient order     Chief Complaint  Patient presents with   Acute Visit    Back of lower neck pain    HPI:  Pt is a 89 y.o. female seen today for an acute visit for c/o pain in the back of lower neck for a week, no injury, C7 spinal spinous process tenderness palpated, CT head/cervical spine 12/18/23 No acute fracture or traumatic subluxation of the cervical spine. 2. Advanced degenerative changes of the cervical spine with moderate spinal canal stenosis at C5-6 and mild spinal canal stenosis at C6-7. 3. High-grade bilateral neural foraminal narrowing from C2-3 through C6-7.    Ambulated with walker in room upon my visit today No further c/o dizziness, chills, nausea, or abd pain  Hx of near syncope: not new, no orthostatic hypotension, last episode of dizziness 12/10/23.  12/17/24 CT head: No acute intracranial pathology. 2. Advanced white matter disease, most likely related to chronic microangiopathy, similar to prior CT.              ED eval 06/05/23 for fall, CT head/cervical spine, thoracic spine showed no acute fracture, she was found to have UTI even if the patient stated she is asymptomatic, urine culture: E coli >100,000c/ml, 7 day course of Keflex             ED eval 01/16/23 near syncope, fall, EKG, CT head/cervical spine, X-ray R knee, CBC, CMP unremarkable.             CT head/cervical spine, incidental finding: 6 mm right solid pulmonary nodule within the upper lobe. Per Fleischner Society Guidelines, recommend a non-contrast Chest CT at 6-12 months-ordered. pending CT chest wo CM. CXR 04/02/23 no acute process.              Edema BLE, not apparent, TED             OSA, CPAP, seeing pulmonology.              A-flutter 01/05/22, no recurrence, delay cardiology OP declined Alendronate after initial consented.  Back pain mid to lower portion, better, X-ray T/L spine showed multilevel degenerative changes, no acute findings CT head/C+T+L spine, and pelvis,  taking Tylenol, Tramadol             Bone spur R foot, callus of foot, f/u podiatrist.  Allergic rhinitis, off Fluticasone nasal spray, Claritin, Atrovent nasal spray.              Anxiety, Lorazepam prn available to her.              Elevated TSH 5.07 06/11/22, TSH 5.11 02/10/23, TSH 3.49 08/11/23,  not on supplement.             HTN, dc'd Losartan by Dr. Maple Hudson 04/02/23? The patient is still taking Losartan 100mg  qd,  continue Metoprolol,  takes ASA, Bun/creat 24/0.96 12/11/23             GERD, stable on Omeprazole, Hgb 12.1 12/11/23             Hyperlipidemia, off Atorvastatin, LDL 70 06/11/22             Prediabetes, diet controlled, Hgb a1c 5.8 08/11/23                Past Medical History:  Diagnosis Date   GERD (gastroesophageal reflux disease)    Hx of cataract surgery    both eyes    Hyperglycemia    Hyperlipidemia    Hypertension    Lumbago 04/05/2016   Macular degeneration    ? which eye(s)   Obstructive sleep apnea    Osteoporosis, senile    Overactive bladder    Squamous cell skin cancer 2/16   left lower leg   Past Surgical History:  Procedure Laterality Date   ABDOMINAL HYSTERECTOMY  1960's   APPENDECTOMY  1960's   MOLE REMOVAL  2000   facial   TONSILLECTOMY      Allergies  Allergen Reactions   Adhesive [Tape]    Ampicillin    Elemental Sulfur    Nitrofuran Derivatives    Zoloft [Sertraline Hcl] Other (See Comments)    Dizziness, nausea, faint feeling, and nightmares      Allergies as of 03/04/2024       Reactions   Adhesive [tape]    Ampicillin    Elemental Sulfur    Nitrofuran Derivatives    Zoloft [sertraline Hcl] Other (See Comments)   Dizziness, nausea, faint feeling, and nightmares          Medication List        Accurate as of March 04, 2024 11:59 PM. If you have any questions, ask your nurse or doctor.          acetaminophen 500 MG tablet Commonly known as: TYLENOL Take 500 mg by mouth daily.   aspirin EC 81 MG tablet Take 1 tablet (81 mg total) by mouth daily.   Calcium Carb-Cholecalciferol 600-800 MG-UNIT Tabs Take 1 tablet by mouth 2 (two) times daily.   LORazepam 0.5 MG tablet Commonly known as: ATIVAN TAKE ONE TABLET ONCE DAILY AS NEEDED FOR ANXIETY.   losartan 100 MG tablet Commonly known as: COZAAR Take 100 mg by mouth daily.   metoprolol succinate 25 MG 24 hr tablet Commonly known as: TOPROL-XL TAKE 1/2 TABLET ONCE A DAY.   Ocuvite Adult 50+ Caps Take 1 capsule by mouth every evening.   omeprazole 40 MG capsule Commonly known as: PRILOSEC TAKE 1 CAPSULE DAILY 1/2 HOUR BEFORE BREAKFAST FOR STOMACH ACID.   Systane 0.4-0.3 % Gel ophthalmic gel Generic drug: Polyethyl Glycol-Propyl Glycol Place 1 application into both eyes daily.   traMADol 50 MG tablet Commonly known as: ULTRAM Take 1 tablet  (50 mg total) by mouth every 6 (six) hours as needed.   TRIAMCINOLONE ACETONIDE(NASAL) NA Place  2 sprays into the nose. Each nostril        Review of Systems  Constitutional:  Negative for appetite change, fatigue and fever.  HENT:  Negative for congestion, hearing loss, trouble swallowing and voice change.   Eyes:  Negative for visual disturbance.  Respiratory:  Negative for cough and shortness of breath.   Cardiovascular:  Negative for leg swelling.  Gastrointestinal:  Negative for abdominal pain, blood in stool, diarrhea, nausea and vomiting.       X1-2 times in the past 3 months, diet adjustment helped.   Genitourinary:  Negative for dysuria and urgency.       Worsened from occasionally urinary leakage to frequent urinary incontinence even if more at night.   Musculoskeletal:  Positive for arthralgias, back pain, gait problem and neck pain.  Skin:  Negative for color change.  Neurological:  Negative for speech difficulty, weakness and light-headedness.       Tingling in toes sometimes, better after moves around.   Psychiatric/Behavioral:  Negative for confusion and sleep disturbance. The patient is not nervous/anxious.     Immunization History  Administered Date(s) Administered   Fluad Quad(high Dose 65+) 10/12/2019, 10/14/2022   Influenza Whole 09/30/2018   Influenza, High Dose Seasonal PF 10/07/2017, 09/28/2021   Influenza-Unspecified 09/27/2015, 10/09/2016, 10/10/2020, 09/30/2023   Moderna Covid-19 Vaccine Bivalent Booster 63yrs & up 10/30/2022   Moderna SARS-COV2 Booster Vaccination 10/14/2023   Moderna Sars-Covid-2 Vaccination 01/02/2020, 01/30/2020, 11/06/2020   Pfizer Fall 2023 Covid-19 Vaccine 6mos thru 58yrs  05/16/2022   Pneumococcal Conjugate-13 01/23/2015   Pneumococcal Polysaccharide-23 10/20/2007   Respiratory Syncytial Virus Vaccine,Recomb Aduvanted(Arexvy) 12/26/2022   Td 12/14/2012   Tdap 11/05/2023   Pertinent  Health Maintenance Due  Topic Date Due    INFLUENZA VACCINE  Completed   DEXA SCAN  Completed      03/05/2023    2:48 PM 03/12/2023    3:32 PM 04/23/2023    1:49 PM 05/20/2023    2:12 PM 08/14/2023   12:41 PM  Fall Risk  Falls in the past year? 0 0 0 0 0  Was there an injury with Fall? 0 0 0 0 0  Fall Risk Category Calculator 0 0 0 0 0  Patient at Risk for Falls Due to No Fall Risks Impaired balance/gait;Impaired mobility History of fall(s) History of fall(s)   Fall risk Follow up Falls evaluation completed Falls evaluation completed Falls evaluation completed Falls evaluation completed Falls evaluation completed   Functional Status Survey:    Vitals:   03/04/24 1156  BP: 128/62  Pulse: 69  Resp: 20  Temp: (!) 97.4 F (36.3 C)  SpO2: 97%  Weight: 165 lb (74.8 kg)   Body mass index is 26.63 kg/m. Physical Exam Vitals and nursing note reviewed.  Constitutional:      Appearance: Normal appearance.  HENT:     Head: Normocephalic and atraumatic.     Mouth/Throat:     Mouth: Mucous membranes are moist.  Eyes:     Extraocular Movements: Extraocular movements intact.     Conjunctiva/sclera: Conjunctivae normal.     Pupils: Pupils are equal, round, and reactive to light.  Cardiovascular:     Rate and Rhythm: Normal rate and regular rhythm.     Heart sounds: No murmur heard. Pulmonary:     Effort: Pulmonary effort is normal.     Breath sounds: Normal breath sounds. No rales.  Abdominal:     General: Bowel sounds are normal.  Palpations: Abdomen is soft.     Tenderness: There is no abdominal tenderness. There is no right CVA tenderness, left CVA tenderness, guarding or rebound.  Musculoskeletal:        General: Tenderness present. No swelling, deformity or signs of injury.     Cervical back: Normal range of motion and neck supple.     Right lower leg: No edema.     Left lower leg: No edema.     Comments: Spinal spinous process C6/7 tenderness palpated and with head moved side to side and up/down.   Skin:     General: Skin is warm and dry.  Neurological:     General: No focal deficit present.     Mental Status: She is alert and oriented to person, place, and time. Mental status is at baseline.     Gait: Gait abnormal.  Psychiatric:        Mood and Affect: Mood normal.        Behavior: Behavior normal.        Thought Content: Thought content normal.        Judgment: Judgment normal.     Labs reviewed: Recent Labs    06/05/23 1548 08/11/23 0000  NA 144 141  K 4.5 4.2  CL 109 106  CO2 25 28*  GLUCOSE 92  --   BUN 20 29*  CREATININE 0.84 1.0  CALCIUM 10.0 8.7   Recent Labs    06/05/23 1548 08/11/23 0000  AST 35 23  ALT 22 14  ALKPHOS 61 56  BILITOT 0.9  --   PROT 5.8*  --   ALBUMIN 3.7 3.4*   Recent Labs    06/05/23 1548 08/11/23 0000  WBC 10.5 3.8  NEUTROABS 8.2* 1,915.00  HGB 11.8* 10.5*  HCT 37.0 32*  MCV 93.9  --   PLT 174 118*   Lab Results  Component Value Date   TSH 3.49 08/11/2023   Lab Results  Component Value Date   HGBA1C 5.8 08/11/2023   Lab Results  Component Value Date   CHOL 139 06/11/2022   HDL 56 06/11/2022   LDLCALC 70 06/11/2022   TRIG 57 06/11/2022   CHOLHDL 2.5 06/11/2022    Significant Diagnostic Results in last 30 days:  No results found.  Assessment/Plan: Osteoarthritis, multiple sites C/o pain in the back of lower neck for a week, no injury, C7 spinal spinous process tenderness palpated X-ray cervical spine, thoracic spine 03/04/24 Xray T spine: no subluxation or compression fx. 03/04/24 Xray C spine: no compression fx Tramadol 25mg  bid  Generalized weakness Ambulated with walker in room upon my visit today No further c/o dizziness, chills, nausea, or abd pain CBC/diff, CMP/eGFR 03/04/24 Wbc 6.4, Hgb 10.6, neutrophils 68.2%, Na 139, K 4, Bun 20, creat 0.94  Atrial flutter (HCC) no recurrence, delay cardiology  Anxiety Lorazepam prn available to her.   HTN (hypertension) Blood pressure is controlled, continue Losartan,  Metoprolol.   GERD (gastroesophageal reflux disease)  stable on Omeprazole, Hgb 12.1 12/11/23    Family/ staff Communication: plan of care reviewed with the patient and charge nurse  Labs/tests ordered:  X-ray cervical spine, thoracic spine, CBC/diff, CMP/eGFR

## 2024-03-04 NOTE — Assessment & Plan Note (Signed)
Blood pressure is controlled, continue Losartan, Metoprolol.  

## 2024-03-04 NOTE — Assessment & Plan Note (Signed)
Lorazepam prn available to her.  

## 2024-03-10 ENCOUNTER — Encounter: Payer: Self-pay | Admitting: Nurse Practitioner

## 2024-03-10 ENCOUNTER — Ambulatory Visit: Admitting: Nurse Practitioner

## 2024-03-10 VITALS — BP 130/78 | HR 82 | Temp 98.8°F | Ht 66.0 in | Wt 168.0 lb

## 2024-03-10 DIAGNOSIS — M8580 Other specified disorders of bone density and structure, unspecified site: Secondary | ICD-10-CM | POA: Diagnosis not present

## 2024-03-10 DIAGNOSIS — F419 Anxiety disorder, unspecified: Secondary | ICD-10-CM

## 2024-03-10 DIAGNOSIS — M150 Primary generalized (osteo)arthritis: Secondary | ICD-10-CM | POA: Diagnosis not present

## 2024-03-10 DIAGNOSIS — I10 Essential (primary) hypertension: Secondary | ICD-10-CM | POA: Diagnosis not present

## 2024-03-10 DIAGNOSIS — Z780 Asymptomatic menopausal state: Secondary | ICD-10-CM

## 2024-03-10 DIAGNOSIS — Z66 Do not resuscitate: Secondary | ICD-10-CM | POA: Diagnosis not present

## 2024-03-10 NOTE — Assessment & Plan Note (Signed)
 stable on Omeprazole, Hgb 10.6 03/04/24

## 2024-03-10 NOTE — Assessment & Plan Note (Signed)
Lorazepam prn available to her.  

## 2024-03-10 NOTE — Progress Notes (Unsigned)
 Location:   [facility]   Place of Service:    Provider: [facility] Emberlin Verner NP  Lille Karim X, NP  Patient Care Team: Alixandra Alfieri X, NP as PCP - General (Internal Medicine) Tyreck Bell X, NP as Nurse Practitioner (Internal Medicine)  Extended Emergency Contact Information Primary Emergency Contact: [facility] Address: [address]          [place], [place] [address] Darden Amber of [place] Home Phone: [phone] Mobile Phone: [phone] Relation: Son  Code Status:  DNR Goals of care: Advanced Directive information    10/27/2023   10:27 AM  Advanced Directives  Does Patient Have a Medical Advance Directive? Yes  Type of Estate agent of [place];Out of facility DNR (pink MOST or yellow form);Living will  Does patient want to make changes to medical advance directive? No - Patient declined  Copy of Healthcare Power of Attorney in Chart? Yes - validated most recent copy scanned in chart (See row information)  Pre-existing out of facility DNR order (yellow form or pink MOST form) Pink MOST/Yellow Form most recent copy in chart - Physician notified to receive inpatient order     Chief Complaint  Patient presents with   Medical Management of Chronic Issues    3 month follow-up. Personal observation: patient repeated herself several times during the visit. Patient does not recall being seen on 03/04/24.  Patient would like an order for more physical therapy. Patient refused to sign consent to treat.     HPI:  Pt is a 89 y.o. female seen today for medical management of chronic diseases.    The chronic pain in the back of lower neck, CT head/cervical spine 12/18/23 No acute fracture or traumatic subluxation of the cervical spine. 2. Advanced degenerative changes of the cervical spine with moderate spinal canal stenosis at C5-6 and mild spinal canal stenosis at C6-7. 3. High-grade bilateral neural foraminal narrowing from C2-3 through C6-7.   Xray 03/04/24 C+T spine, no  compression fx or subluxation   Taking Tamadol for pain.              Ambulated with walker in room upon my visit today No further c/o dizziness, chills, nausea, or abd pain             Hx of near syncope: not new, no orthostatic hypotension, last episode of dizziness 12/10/23. 12/17/24 CT head: No acute intracranial pathology. 2. Advanced white matter disease, most likely related to chronic microangiopathy, similar to prior CT.              ED eval 06/05/23 for fall, CT head/cervical spine, thoracic spine showed no acute fracture, she was found to have UTI even if the patient stated she is asymptomatic, urine culture: E coli >100,000c/ml, 7 day course of Keflex             ED eval 01/16/23 near syncope, fall, EKG, CT head/cervical spine, X-ray R knee, CBC, CMP unremarkable.             CT head/cervical spine, incidental finding: 6 mm right solid pulmonary nodule within the upper lobe. Per Fleischner Society Guidelines, recommend a non-contrast Chest CT at 6-12 months-ordered. pending CT chest wo CM. CXR 04/02/23 no acute process.              Edema BLE, not apparent, TED             OSA, CPAP, seeing pulmonology.  A-flutter 01/05/22, no recurrence, delay cardiology OP declined Alendronate after initial consented.  Back pain mid to lower portion, better, X-ray T/L spine showed multilevel degenerative changes, no acute findings CT head/C+T+L spine, and pelvis,  taking Tylenol, Tramadol             Bone spur R foot, callus of foot, f/u podiatrist.              Allergic rhinitis, off Fluticasone nasal spray, Claritin, Atrovent nasal spray.              Anxiety, Lorazepam prn available to her.              Elevated TSH 5.07 06/11/22, TSH 5.11 02/10/23, TSH 3.49 08/11/23,  not on supplement.             HTN, dc'd Losartan by Dr. Maple Hudson 04/02/23? The patient is still taking Losartan 100mg  qd,  continue Metoprolol,  takes ASA, Bun/creat 20/0.94 03/04/24             GERD, stable on Omeprazole, Hgb 10.6  03/04/24             Hyperlipidemia, off Atorvastatin, LDL 70 06/11/22             Prediabetes, diet controlled, Hgb a1c 5.8 08/11/23  Past Medical History:  Diagnosis Date   GERD (gastroesophageal reflux disease)    Hx of cataract surgery    both eyes   Hyperglycemia    Hyperlipidemia    Hypertension    Lumbago 04/05/2016   Macular degeneration    ? which eye(s)   Obstructive sleep apnea    Osteoporosis, senile    Overactive bladder    Squamous cell skin cancer 2/16   left lower leg   Past Surgical History:  Procedure Laterality Date   ABDOMINAL HYSTERECTOMY  1960's   APPENDECTOMY  1960's   MOLE REMOVAL  2000   facial   TONSILLECTOMY      Allergies  Allergen Reactions   Adhesive [Tape]    Ampicillin    Elemental Sulfur    Nitrofuran Derivatives    Zoloft [Sertraline Hcl] Other (See Comments)    Dizziness, nausea, faint feeling, and nightmares      Allergies as of 03/10/2024       Reactions   Adhesive [tape]    Ampicillin    Elemental Sulfur    Nitrofuran Derivatives    Zoloft [sertraline Hcl] Other (See Comments)   Dizziness, nausea, faint feeling, and nightmares          Medication List        Accurate as of March 10, 2024 11:59 PM. If you have any questions, ask your nurse or doctor.          acetaminophen 500 MG tablet Commonly known as: TYLENOL Take 500 mg by mouth daily.   aspirin EC 81 MG tablet Take 1 tablet (81 mg total) by mouth daily.   Calcium Carb-Cholecalciferol 600-800 MG-UNIT Tabs Take 1 tablet by mouth 2 (two) times daily.   LORazepam 0.5 MG tablet Commonly known as: ATIVAN TAKE ONE TABLET ONCE DAILY AS NEEDED FOR ANXIETY.   losartan 100 MG tablet Commonly known as: COZAAR Take 100 mg by mouth daily.   metoprolol succinate 25 MG 24 hr tablet Commonly known as: TOPROL-XL TAKE 1/2 TABLET ONCE A DAY.   Ocuvite Adult 50+ Caps Take 1 capsule by mouth every evening.   omeprazole 40 MG capsule Commonly known as:  PRILOSEC TAKE 1  CAPSULE DAILY 1/2 HOUR BEFORE BREAKFAST FOR STOMACH ACID.   Systane 0.4-0.3 % Gel ophthalmic gel Generic drug: Polyethyl Glycol-Propyl Glycol Place 1 application into both eyes daily.   traMADol 50 MG tablet Commonly known as: ULTRAM Take 1 tablet (50 mg total) by mouth every 6 (six) hours as needed.   TRIAMCINOLONE ACETONIDE(NASAL) NA Place 2 sprays into the nose. Each nostril        Review of Systems  Constitutional:  Negative for appetite change, fatigue and fever.  HENT:  Negative for congestion, hearing loss, trouble swallowing and voice change.   Eyes:  Negative for visual disturbance.  Respiratory:  Negative for cough and shortness of breath.   Cardiovascular:  Negative for leg swelling.  Gastrointestinal:  Negative for abdominal pain, blood in stool, diarrhea, nausea and vomiting.       X1-2 times in the past 3 months, diet adjustment helped.   Genitourinary:  Negative for dysuria and urgency.       Worsened from occasionally urinary leakage to frequent urinary incontinence even if more at night.   Musculoskeletal:  Positive for arthralgias, back pain, gait problem and neck pain.  Skin:  Negative for color change.  Neurological:  Negative for speech difficulty, weakness and light-headedness.       Tingling in toes sometimes, better after moves around.   Psychiatric/Behavioral:  Negative for confusion and sleep disturbance. The patient is not nervous/anxious.     Immunization History  Administered Date(s) Administered   Fluad Quad(high Dose 65+) 10/12/2019, 10/14/2022   Influenza Whole 09/30/2018   Influenza, High Dose Seasonal PF 10/07/2017, 09/28/2021   Influenza-Unspecified 09/27/2015, 10/09/2016, 10/10/2020, 09/30/2023   Moderna Covid-19 Vaccine Bivalent Booster 36yrs & up 10/30/2022   Moderna SARS-COV2 Booster Vaccination 10/14/2023   Moderna Sars-Covid-2 Vaccination 01/02/2020, 01/30/2020, 11/06/2020   Pfizer Fall 2023 Covid-19 Vaccine 6mos  thru 25yrs  05/16/2022   Pneumococcal Conjugate-13 01/23/2015   Pneumococcal Polysaccharide-23 10/20/2007   Respiratory Syncytial Virus Vaccine,Recomb Aduvanted(Arexvy) 12/26/2022   Td 12/14/2012   Tdap 11/05/2023   Pertinent  Health Maintenance Due  Topic Date Due   INFLUENZA VACCINE  Completed   DEXA SCAN  Completed      03/05/2023    2:48 PM 03/12/2023    3:32 PM 04/23/2023    1:49 PM 05/20/2023    2:12 PM 08/14/2023   12:41 PM  Fall Risk  Falls in the past year? 0 0 0 0 0  Was there an injury with Fall? 0 0 0 0 0  Fall Risk Category Calculator 0 0 0 0 0  Patient at Risk for Falls Due to No Fall Risks Impaired balance/gait;Impaired mobility History of fall(s) History of fall(s)   Fall risk Follow up Falls evaluation completed Falls evaluation completed Falls evaluation completed Falls evaluation completed Falls evaluation completed   Functional Status Survey:    Vitals:   03/10/24 1136  BP: 130/78  Pulse: 82  Temp: 98.8 F (37.1 C)  TempSrc: Temporal  SpO2: 98%  Weight: 168 lb (76.2 kg)  Height: 5\' 6"  (1.676 m)   Body mass index is 27.12 kg/m. Physical Exam Vitals and nursing note reviewed.  Constitutional:      Appearance: Normal appearance.  HENT:     Head: Normocephalic and atraumatic.     Mouth/Throat:     Mouth: Mucous membranes are moist.  Eyes:     Extraocular Movements: Extraocular movements intact.     Conjunctiva/sclera: Conjunctivae normal.     Pupils: Pupils are equal,  round, and reactive to light.  Cardiovascular:     Rate and Rhythm: Normal rate and regular rhythm.     Heart sounds: No murmur heard. Pulmonary:     Effort: Pulmonary effort is normal.     Breath sounds: Normal breath sounds. No rales.  Abdominal:     General: Bowel sounds are normal.     Palpations: Abdomen is soft.     Tenderness: There is no abdominal tenderness. There is no right CVA tenderness, left CVA tenderness, guarding or rebound.  Musculoskeletal:        General:  Tenderness present. No swelling, deformity or signs of injury.     Cervical back: Normal range of motion and neck supple.     Right lower leg: No edema.     Left lower leg: No edema.     Comments: Spinal spinous process C6/7 tenderness palpated and with head moved side to side and up/down.   Skin:    General: Skin is warm and dry.  Neurological:     General: No focal deficit present.     Mental Status: She is alert and oriented to person, place, and time. Mental status is at baseline.     Gait: Gait abnormal.  Psychiatric:        Mood and Affect: Mood normal.        Behavior: Behavior normal.        Thought Content: Thought content normal.        Judgment: Judgment normal.     Labs reviewed: Recent Labs    06/05/23 1548 08/11/23 0000  NA 144 141  K 4.5 4.2  CL 109 106  CO2 25 28*  GLUCOSE 92  --   BUN 20 29*  CREATININE 0.84 1.0  CALCIUM 10.0 8.7   Recent Labs    06/05/23 1548 08/11/23 0000  AST 35 23  ALT 22 14  ALKPHOS 61 56  BILITOT 0.9  --   PROT 5.8*  --   ALBUMIN 3.7 3.4*   Recent Labs    06/05/23 1548 08/11/23 0000  WBC 10.5 3.8  NEUTROABS 8.2* 1,915.00  HGB 11.8* 10.5*  HCT 37.0 32*  MCV 93.9  --   PLT 174 118*   Lab Results  Component Value Date   TSH 3.49 08/11/2023   Lab Results  Component Value Date   HGBA1C 5.8 08/11/2023   Lab Results  Component Value Date   CHOL 139 06/11/2022   HDL 56 06/11/2022   LDLCALC 70 06/11/2022   TRIG 57 06/11/2022   CHOLHDL 2.5 06/11/2022    Significant Diagnostic Results in last 30 days:  No results found.  Assessment/Plan  Osteoarthritis, multiple sites Back pain mid to lower portion, better, X-ray T/L spine showed multilevel degenerative changes, no acute findings CT head/C+T+L spine, and pelvis,  taking Tylenol, Tramadol  Anxiety Lorazepam prn available to her.   HTN (hypertension) dc'd Losartan by Dr. Maple Hudson 04/02/23? The patient is still taking Losartan 100mg  qd,  continue Metoprolol,   takes ASA, Bun/creat 20/0.94 03/04/24  GERD (gastroesophageal reflux disease) stable on Omeprazole, Hgb 10.6 03/04/24  Osteopenia after menopause declined Alendronate after initial consented.    Family/ staff Communication: plan of care reviewed with the patient and charge nurse.   Labs/tests ordered:  none

## 2024-03-10 NOTE — Assessment & Plan Note (Signed)
 dc'd Losartan by Dr. Maple Hudson 04/02/23? The patient is still taking Losartan 100mg  qd,  continue Metoprolol,  takes ASA, Bun/creat 20/0.94 03/04/24

## 2024-03-10 NOTE — Assessment & Plan Note (Signed)
declined Alendronate after initial consented.  

## 2024-03-10 NOTE — Assessment & Plan Note (Signed)
Back pain mid to lower portion, better, X-ray T/L spine showed multilevel degenerative changes, no acute findings CT head/C+T+L spine, and pelvis,  taking Tylenol, Tramadol

## 2024-03-11 ENCOUNTER — Encounter: Payer: Self-pay | Admitting: Nurse Practitioner

## 2024-03-21 ENCOUNTER — Non-Acute Institutional Stay: Payer: Self-pay | Admitting: Sports Medicine

## 2024-03-21 ENCOUNTER — Encounter: Payer: Self-pay | Admitting: Sports Medicine

## 2024-03-21 DIAGNOSIS — D649 Anemia, unspecified: Secondary | ICD-10-CM | POA: Diagnosis not present

## 2024-03-21 DIAGNOSIS — M150 Primary generalized (osteo)arthritis: Secondary | ICD-10-CM | POA: Diagnosis not present

## 2024-03-21 DIAGNOSIS — F419 Anxiety disorder, unspecified: Secondary | ICD-10-CM | POA: Diagnosis not present

## 2024-03-21 DIAGNOSIS — I10 Essential (primary) hypertension: Secondary | ICD-10-CM

## 2024-03-21 DIAGNOSIS — K219 Gastro-esophageal reflux disease without esophagitis: Secondary | ICD-10-CM | POA: Diagnosis not present

## 2024-03-21 NOTE — Progress Notes (Signed)
 Provider:  Dr. Venita Sheffield Location:  [facility] Place of Service:     PCP: Mast, Man X, NP Patient Care Team: Mast, Man X, NP as PCP - General (Internal Medicine) Mast, Man X, NP as Nurse Practitioner (Internal Medicine)  Extended Emergency Contact Information Primary Emergency Contact: [facility] Address: [address]          [place], [place] [address] Darden Amber of [place] Home Phone: [phone] Mobile Phone: [phone] Relation: Son  Goals of Care: Advanced Directive information    10/27/2023   10:27 AM  Advanced Directives  Does Patient Have a Medical Advance Directive? Yes  Type of Estate agent of [place];Out of facility DNR (pink MOST or yellow form);Living will  Does patient want to make changes to medical advance directive? No - Patient declined  Copy of Healthcare Power of Attorney in Chart? Yes - validated most recent copy scanned in chart (See row information)  Pre-existing out of facility DNR order (yellow form or pink MOST form) Pink MOST/Yellow Form most recent copy in chart - Physician notified to receive inpatient order       History of Present Illness           89 year old female with a past medical history of hypertension, GERD, osteoarthritis, generalized anxiety disorder, allergic rhinitis is evaluated for  follow up  Patient seen and examined in her room.  She seems pleasant and comfortable and does not appear to be in distress. She has no acute concerns today and reports good health. Patient states she likes to read books. Ambulates with a rollator walker. Patient denies chest pain, palpitations, shortness of breath, abdominal pain, nausea, vomiting, dizzy, lightheadedness.  As per nursing staff patient has been doing okay no agitation or behavioral problems reported. Patient reports good appetite  02/28/2024 00:29 165 Lbs (Standing) 01/31/2024 00:01 164 Lbs (Standing) +3.0% change from last weight  [ Comparison Weight 11/29/2023, 160.0 lbs, +3.4%, +5.4 lbs] 12/30/2023 00:39 165.4 Lbs (Standing) 11/29/2023 08:38 160 Lbs (Standing) 10/30/2023 08:17 160.4 Lbs (Standing) 09/29/2023 11:52 159.8 Lbs (Standing  Past Medical History:  Diagnosis Date   GERD (gastroesophageal reflux disease)    Hx of cataract surgery    both eyes   Hyperglycemia    Hyperlipidemia    Hypertension    Lumbago 04/05/2016   Macular degeneration    ? which eye(s)   Obstructive sleep apnea    Osteoporosis, senile    Overactive bladder    Squamous cell skin cancer 2/16   left lower leg   Past Surgical History:  Procedure Laterality Date   ABDOMINAL HYSTERECTOMY  1960's   APPENDECTOMY  1960's   MOLE REMOVAL  2000   facial   TONSILLECTOMY      reports that she has never smoked. She has never used smokeless tobacco. She reports that she does not currently use alcohol. She reports that she does not use drugs. Social History   Socioeconomic History   Marital status: Widowed    Spouse name: Not on file   Number of children: Not on file   Years of education: Not on file   Highest education level: Not on file  Occupational History   Occupation: retired Scientist, research (physical sciences)  Tobacco Use   Smoking status: Never   Smokeless tobacco: Never  Vaping Use   Vaping status: Never Used  Substance and Sexual Activity   Alcohol use: Not Currently    Comment: occassionally 1/2 glass of wine 1-2 x month  Drug use: No   Sexual activity: Never  Other Topics Concern   Not on file  Social History Narrative   Lives at [facility] since 11/12/15   Widow   Never smoked   Alcohol occasionally 1/2 glass of wine   Exercise none   POA, Living Will   Walks with walker   Social Drivers of Health   Financial Resource Strain: Not on file  Food Insecurity: Not on file  Transportation Needs: Not on file  Physical Activity: Not on file  Stress: Not on file  Social Connections: Not on file  Intimate Partner  Violence: Not on file    Functional Status Survey:    Family History  Problem Relation Age of Onset   Heart disease Mother    Heart disease Father     Health Maintenance  Topic Date Due   COVID-19 Vaccine (6 - 2024-25 season) 12/09/2023   Zoster Vaccines- Shingrix (1 of 2) 12/29/2094 (Originally 01/24/1976)   Medicare Annual Wellness (AWV)  10/26/2024   DTaP/Tdap/Td (3 - Td or Tdap) 11/04/2033   Pneumonia Vaccine 48+ Years old  Completed   INFLUENZA VACCINE  Completed   DEXA SCAN  Completed   HPV VACCINES  Aged Out    Allergies  Allergen Reactions   Adhesive [Tape]    Ampicillin    Elemental Sulfur    Nitrofuran Derivatives    Zoloft [Sertraline Hcl] Other (See Comments)    Dizziness, nausea, faint feeling, and nightmares      Outpatient Encounter Medications as of 03/21/2024  Medication Sig   acetaminophen (TYLENOL) 500 MG tablet Take 500 mg by mouth daily.    aspirin EC 81 MG tablet Take 1 tablet (81 mg total) by mouth daily.   Calcium Carb-Cholecalciferol 600-800 MG-UNIT TABS Take 1 tablet by mouth 2 (two) times daily.    LORazepam (ATIVAN) 0.5 MG tablet TAKE ONE TABLET ONCE DAILY AS NEEDED FOR ANXIETY.   losartan (COZAAR) 100 MG tablet Take 100 mg by mouth daily.   metoprolol succinate (TOPROL-XL) 25 MG 24 hr tablet TAKE 1/2 TABLET ONCE A DAY.   Multiple Vitamins-Minerals (OCUVITE ADULT 50+) CAPS Take 1 capsule by mouth every evening.   omeprazole (PRILOSEC) 40 MG capsule TAKE 1 CAPSULE DAILY 1/2 HOUR BEFORE BREAKFAST FOR STOMACH ACID.   Polyethyl Glycol-Propyl Glycol (SYSTANE) 0.4-0.3 % GEL ophthalmic gel Place 1 application into both eyes daily.   traMADol (ULTRAM) 50 MG tablet Take 1 tablet (50 mg total) by mouth every 6 (six) hours as needed.   TRIAMCINOLONE ACETONIDE,NASAL, NA Place 2 sprays into the nose. Each nostril   No facility-administered encounter medications on file as of 03/21/2024.    Review of Systems  Constitutional:  Negative for chills and  fever.  HENT:  Negative for sinus pressure and sore throat.   Respiratory:  Negative for cough, shortness of breath and wheezing.   Cardiovascular:  Negative for chest pain, palpitations and leg swelling.  Gastrointestinal:  Negative for abdominal distention, abdominal pain, blood in stool, constipation, diarrhea, nausea and vomiting.  Genitourinary:  Negative for dysuria, frequency and urgency.  Neurological:  Negative for dizziness, weakness and numbness.  Psychiatric/Behavioral:  Negative for confusion.    Negative unless indicated in HPI.  There were no vitals filed for this visit. There is no height or weight on file to calculate BMI. BP Readings from Last 3 Encounters:  03/10/24 130/78  03/04/24 128/62  12/15/23 (!) 140/78   Wt Readings from Last 3 Encounters:  03/10/24 168 lb (76.2 kg)  03/04/24 165 lb (74.8 kg)  12/14/23 160 lb 3.2 oz (72.7 kg)   Physical Exam Constitutional:      Appearance: Normal appearance.  HENT:     Head: Normocephalic and atraumatic.  Cardiovascular:     Rate and Rhythm: Normal rate and regular rhythm.  Pulmonary:     Effort: Pulmonary effort is normal. No respiratory distress.     Breath sounds: Normal breath sounds. No wheezing.  Abdominal:     General: Bowel sounds are normal. There is no distension.     Tenderness: There is no abdominal tenderness. There is no guarding or rebound.     Comments:    Musculoskeletal:        General: No swelling or tenderness.  Neurological:     Mental Status: She is alert. Mental status is at baseline.     Motor: No weakness.     Labs reviewed: Basic Metabolic Panel: Recent Labs    06/05/23 1548 08/11/23 0000  NA 144 141  K 4.5 4.2  CL 109 106  CO2 25 28*  GLUCOSE 92  --   BUN 20 29*  CREATININE 0.84 1.0  CALCIUM 10.0 8.7   Liver Function Tests: Recent Labs    06/05/23 1548 08/11/23 0000  AST 35 23  ALT 22 14  ALKPHOS 61 56  BILITOT 0.9  --   PROT 5.8*  --   ALBUMIN 3.7 3.4*   No  results for input(s): "LIPASE", "AMYLASE" in the last 8760 hours. No results for input(s): "AMMONIA" in the last 8760 hours. CBC: Recent Labs    06/05/23 1548 08/11/23 0000  WBC 10.5 3.8  NEUTROABS 8.2* 1,915.00  HGB 11.8* 10.5*  HCT 37.0 32*  MCV 93.9  --   PLT 174 118*   Cardiac Enzymes: Recent Labs    06/05/23 1548  CKTOTAL 486*   BNP: Invalid input(s): "POCBNP" Lab Results  Component Value Date   HGBA1C 5.8 08/11/2023   Lab Results  Component Value Date   TSH 3.49 08/11/2023   Lab Results  Component Value Date   VITAMINB12 420 07/20/2017   Lab Results  Component Value Date   FOLATE 13.5 07/20/2017   Lab Results  Component Value Date   FERRITIN 34 01/25/2018    Imaging and Procedures obtained prior to SNF admission: CT CERVICAL SPINE WO CONTRAST Result Date: 01/06/2024 CLINICAL DATA:  Episode of dizziness.  Cervical radiculopathy. EXAM: CT CERVICAL SPINE WITHOUT CONTRAST TECHNIQUE: Multidetector CT imaging of the cervical spine was performed without intravenous contrast. Multiplanar CT image reconstructions were also generated. RADIATION DOSE REDUCTION: This exam was performed according to the departmental dose-optimization program which includes automated exposure control, adjustment of the mA and/or kV according to patient size and/or use of iterative reconstruction technique. COMPARISON:  CT cervical spine June 05, 2023. FINDINGS: Alignment: Small anterolisthesis of C3 over C4. Mild atlantooccipital and atlantoaxial subluxation on the right side, degenerative, unchanged. Skull base and vertebrae: No acute fracture. No primary bone lesion or focal pathologic process. Soft tissues and spinal canal: No prevertebral fluid or swelling. No visible canal hematoma. Disc levels: Vacuum phenomenon at C4-5, C5-6 and C6-7. Posterior disc osteophyte complex is are noted throughout the cervical spine, more pronounced C5-6 and C6-7 with moderate and mild spinal canal stenosis,  respectively. High-grade bilateral neural foraminal narrowing from C2-3 through C6-7. Upper chest: Negative. Other: Mild calcified plaques in the bilateral carotid bifurcation. IMPRESSION: 1. No acute fracture or traumatic subluxation  of the cervical spine. 2. Advanced degenerative changes of the cervical spine with moderate spinal canal stenosis at C5-6 and mild spinal canal stenosis at C6-7. 3. High-grade bilateral neural foraminal narrowing from C2-3 through C6-7. Electronically Signed   By: Baldemar Lenis M.D.   On: 01/06/2024 09:28   CT HEAD WO CONTRAST ( ) Result Date: 01/06/2024 CLINICAL DATA:  Syncope/presyncope, cerebrovascular cause suspected. Episode of dizziness. EXAM: CT HEAD WITHOUT CONTRAST TECHNIQUE: Contiguous axial images were obtained from the base of the skull through the vertex without intravenous contrast. RADIATION DOSE REDUCTION: This exam was performed according to the departmental dose-optimization program which includes automated exposure control, adjustment of the mA and/or kV according to patient size and/or use of iterative reconstruction technique. COMPARISON:  Head CT June 05, 2023. FINDINGS: Brain: No evidence of acute infarction, hemorrhage, extra-axial collection or mass lesion/mass effect. Prominence of the supratentorial ventricles, more evident in the temporal lobes, likely related to parenchymal volume loss. Confluent hypodensity of the white matter of the cerebral hemispheres, nonspecific, most likely related to chronic microangiopathy, similar to prior CT. Vascular: Calcified plaques in the bilateral carotid siphons. Skull: Normal. Negative for fracture or focal lesion. Sinuses/Orbits: No acute finding. Other: None. IMPRESSION: 1. No acute intracranial pathology. 2. Advanced white matter disease, most likely related to chronic microangiopathy, similar to prior CT. Electronically Signed   By: Baldemar Lenis M.D.   On: 01/06/2024 09:13     Assessment and Plan     Osteoarthritis Patient reports she is feeling.  Denies pain Continue with Tylenol, tramadol as needed  Generalized anxiety disorder As per nursing staff patient has been doing okay Continue with Ativan as needed   Hypertension blood pressure at goal 133/70 Denies feeling dizzy, lightheaded Continue with losartan, Toprol 12.5 mg daily     Anemia Hb 10.5 Will check iron studies, b12 Will repeat labs    GERD Denies heartburn, acid reflux Continue with omeprazole 40 mg daily.      Labs/tests ordered: will order routine labs

## 2024-03-29 DIAGNOSIS — N1831 Chronic kidney disease, stage 3a: Secondary | ICD-10-CM | POA: Diagnosis not present

## 2024-03-29 DIAGNOSIS — D513 Other dietary vitamin B12 deficiency anemia: Secondary | ICD-10-CM | POA: Diagnosis not present

## 2024-03-29 DIAGNOSIS — R42 Dizziness and giddiness: Secondary | ICD-10-CM | POA: Diagnosis not present

## 2024-03-29 DIAGNOSIS — I10 Essential (primary) hypertension: Secondary | ICD-10-CM | POA: Diagnosis not present

## 2024-03-29 DIAGNOSIS — R634 Abnormal weight loss: Secondary | ICD-10-CM | POA: Diagnosis not present

## 2024-03-29 LAB — CBC AND DIFFERENTIAL
HCT: 32 — AB (ref 36–46)
Hemoglobin: 10.1 — AB (ref 12.0–16.0)
Neutrophils Absolute: 3683
Platelets: 235 10*3/uL (ref 150–400)
WBC: 6.6

## 2024-03-29 LAB — BASIC METABOLIC PANEL WITH GFR
BUN: 18 (ref 4–21)
CO2: 29 — AB (ref 13–22)
Chloride: 106 (ref 99–108)
Creatinine: 0.8 (ref 0.5–1.1)
Glucose: 91
Potassium: 4.2 meq/L (ref 3.5–5.1)
Sodium: 142 (ref 137–147)

## 2024-03-29 LAB — HEPATIC FUNCTION PANEL
ALT: 13 U/L (ref 7–35)
AST: 19 (ref 13–35)
Alkaline Phosphatase: 73 (ref 25–125)
Bilirubin, Total: 0.5

## 2024-03-29 LAB — COMPREHENSIVE METABOLIC PANEL WITH GFR
Albumin: 4.1 (ref 3.5–5.0)
Calcium: 9.5 (ref 8.7–10.7)
Globulin: 1.8
eGFR: 64

## 2024-03-29 LAB — VITAMIN B12: Vitamin B-12: 280

## 2024-03-29 LAB — CBC: RBC: 3.58 — AB (ref 3.87–5.11)

## 2024-03-30 ENCOUNTER — Non-Acute Institutional Stay: Payer: Self-pay | Admitting: Adult Health

## 2024-03-30 DIAGNOSIS — D509 Iron deficiency anemia, unspecified: Secondary | ICD-10-CM

## 2024-03-30 DIAGNOSIS — K219 Gastro-esophageal reflux disease without esophagitis: Secondary | ICD-10-CM | POA: Diagnosis not present

## 2024-03-30 DIAGNOSIS — I10 Essential (primary) hypertension: Secondary | ICD-10-CM | POA: Diagnosis not present

## 2024-03-31 ENCOUNTER — Encounter: Payer: Self-pay | Admitting: Adult Health

## 2024-03-31 NOTE — Progress Notes (Signed)
 Location:  [facility] Room Number: [phone] Place of Service:  ALF [phone]) Provider:  Gillis Santa, NP    Patient Care Team: Mast, Man X, NP as PCP - General (Internal Medicine) Mast, Man X, NP as Nurse Practitioner (Internal Medicine)  Extended Emergency Contact Information Primary Emergency Contact: [facility] Address: [address]          [place], [place] [address] Darden Amber of [place] Home Phone: [phone] Mobile Phone: [phone] Relation: Son  Code Status:  DNR Goals of care: Advanced Directive information    10/27/2023   10:27 AM  Advanced Directives  Does Patient Have a Medical Advance Directive? Yes  Type of Estate agent of [place];Out of facility DNR (pink MOST or yellow form);Living will  Does patient want to make changes to medical advance directive? No - Patient declined  Copy of Healthcare Power of Attorney in Chart? Yes - validated most recent copy scanned in chart (See row information)  Pre-existing out of facility DNR order (yellow form or pink MOST form) Pink MOST/Yellow Form most recent copy in chart - Physician notified to receive inpatient order     Chief Complaint  Patient presents with   Acute Visit    Low iron level    HPI:  Pt is a 89 y.o. female seen today for an acute visit for low iron level.  Recent laboratory studies show an iron total of 46, iron binding capacity of 393, percent saturation of 12, and ferritin of 8, taken on March 29, 2024. A CBC done on March 04, 2024, showed a hemoglobin of 10.6 and hematocrit of 33.5. She resides at [facility] assisted living facility and ambulates using a walker.  She has a history of GERD and takes omeprazole 40 mg daily. No heartburn is reported, indicating well-controlled symptoms.  She also has hypertension and is on Toprol XL 25 mg, taking half a tablet daily, and losartan 100 mg daily. Her latest blood pressure reading was 126, which  is stable.  She resides at [facility] assisted living facility.   Past Medical History:  Diagnosis Date   GERD (gastroesophageal reflux disease)    Hx of cataract surgery    both eyes   Hyperglycemia    Hyperlipidemia    Hypertension    Lumbago 04/05/2016   Macular degeneration    ? which eye(s)   Obstructive sleep apnea    Osteoporosis, senile    Overactive bladder    Squamous cell skin cancer 2/16   left lower leg   Past Surgical History:  Procedure Laterality Date   ABDOMINAL HYSTERECTOMY  1960's   APPENDECTOMY  1960's   MOLE REMOVAL  2000   facial   TONSILLECTOMY      Allergies  Allergen Reactions   Adhesive [Tape]    Ampicillin    Elemental Sulfur    Nitrofuran Derivatives    Zoloft [Sertraline Hcl] Other (See Comments)    Dizziness, nausea, faint feeling, and nightmares      Outpatient Encounter Medications as of 03/30/2024  Medication Sig   acetaminophen (TYLENOL) 325 MG tablet Take 325 mg by mouth every 4 (four) hours as needed. 2 tablets by mouth every 4 hours as need for fever and pain   acetaminophen (TYLENOL) 500 MG tablet Take 500 mg by mouth daily.    aspirin EC 81 MG tablet Take 1 tablet (81 mg total) by mouth daily.   Calcium Carb-Cholecalciferol 600-800 MG-UNIT TABS Take  1 tablet by mouth 2 (two) times daily.    ferrous sulfate 324 MG TBEC Take 324 mg by mouth 3 (three) times a week. 1 tablet by mouth one time a day every Mon, Wed, Fri for Iron Deficiency Anemia.   loperamide (IMODIUM A-D) 2 MG tablet Take 2 mg by mouth every 6 (six) hours as needed for diarrhea or loose stools.   LORazepam (ATIVAN) 0.5 MG tablet TAKE ONE TABLET ONCE DAILY AS NEEDED FOR ANXIETY.   losartan (COZAAR) 100 MG tablet Take 100 mg by mouth daily.   metoprolol succinate (TOPROL-XL) 25 MG 24 hr tablet TAKE 1/2 TABLET ONCE A DAY.   Multiple Vitamins-Minerals (OCUVITE ADULT 50+) CAPS Take 1 capsule by mouth every evening.   Polyethyl Glycol-Propyl Glycol (SYSTANE)  0.4-0.3 % GEL ophthalmic gel Place 1 application into both eyes daily.   traMADol (ULTRAM) 50 MG tablet Take 1 tablet (50 mg total) by mouth every 6 (six) hours as needed. (Patient taking differently: Take 25 mg by mouth every 12 (twelve) hours as needed.)   TRIAMCINOLONE ACETONIDE,NASAL, NA Place 2 sprays into the nose. Each nostril   omeprazole (PRILOSEC) 40 MG capsule TAKE 1 CAPSULE DAILY 1/2 HOUR BEFORE BREAKFAST FOR STOMACH ACID. (Patient not taking: Reported on 03/31/2024)   No facility-administered encounter medications on file as of 03/30/2024.    Review of Systems  Constitutional:  Negative for appetite change, chills, fatigue and fever.  HENT:  Negative for congestion, hearing loss, rhinorrhea and sore throat.   Eyes: Negative.   Respiratory:  Negative for cough, shortness of breath and wheezing.   Cardiovascular:  Negative for chest pain, palpitations and leg swelling.  Gastrointestinal:  Negative for abdominal pain, constipation, diarrhea, nausea and vomiting.  Genitourinary:  Negative for dysuria.  Musculoskeletal:  Negative for arthralgias, back pain and myalgias.  Skin:  Negative for color change, rash and wound.  Neurological:  Negative for dizziness, weakness and headaches.  Psychiatric/Behavioral:  Negative for behavioral problems. The patient is not nervous/anxious.     Immunization History  Administered Date(s) Administered   Fluad Quad(high Dose 65+) 10/12/2019, 10/14/2022   Influenza Whole 09/30/2018   Influenza, High Dose Seasonal PF 10/07/2017, 09/28/2021   Influenza-Unspecified 09/27/2015, 10/09/2016, 10/10/2020, 09/30/2023   Moderna Covid-19 Vaccine Bivalent Booster 86yrs & up 10/30/2022   Moderna SARS-COV2 Booster Vaccination 10/14/2023   Moderna Sars-Covid-2 Vaccination 01/02/2020, 01/30/2020, 11/06/2020   Pfizer Fall 2024 Covid-19 Vaccine 6mos thru 17yrs 05/16/2022   Pneumococcal Conjugate-13 01/23/2015   Pneumococcal Polysaccharide-23 10/20/2007    Respiratory Syncytial Virus Vaccine,Recomb Aduvanted(Arexvy) 12/26/2022   Td 12/14/2012   Tdap 11/05/2023   Pertinent  Health Maintenance Due  Topic Date Due   INFLUENZA VACCINE  07/29/2024   DEXA SCAN  Completed      03/05/2023    2:48 PM 03/12/2023    3:32 PM 04/23/2023    1:49 PM 05/20/2023    2:12 PM 08/14/2023   12:41 PM  Fall Risk  Falls in the past year? 0 0 0 0 0  Was there an injury with Fall? 0 0 0 0 0  Fall Risk Category Calculator 0 0 0 0 0  Patient at Risk for Falls Due to No Fall Risks Impaired balance/gait;Impaired mobility History of fall(s) History of fall(s)   Fall risk Follow up Falls evaluation completed Falls evaluation completed Falls evaluation completed Falls evaluation completed Falls evaluation completed   Functional Status Survey:    Vitals:   03/31/24 0946  BP: 126/77  Pulse: 67  Resp: 18  Temp: (!) 96 F (35.6 C)  SpO2: 97%  Weight: 171 lb 9.6 oz (77.8 kg)  Height: 5\' 6"  (1.676 m)   Body mass index is 27.7 kg/m. Physical Exam Constitutional:      Appearance: Normal appearance.  HENT:     Head: Normocephalic and atraumatic.     Nose: Nose normal.     Mouth/Throat:     Mouth: Mucous membranes are moist.  Eyes:     Conjunctiva/sclera: Conjunctivae normal.  Cardiovascular:     Rate and Rhythm: Normal rate and regular rhythm.  Pulmonary:     Effort: Pulmonary effort is normal.     Breath sounds: Normal breath sounds.  Abdominal:     General: Bowel sounds are normal.     Palpations: Abdomen is soft.  Musculoskeletal:        General: Normal range of motion.     Cervical back: Normal range of motion.  Skin:    General: Skin is warm and dry.  Neurological:     General: No focal deficit present.     Mental Status: She is alert and oriented to person, place, and time.  Psychiatric:        Mood and Affect: Mood normal.        Behavior: Behavior normal.     Labs reviewed: Recent Labs    06/05/23 1548 08/11/23 0000  NA 144 141  K  4.5 4.2  CL 109 106  CO2 25 28*  GLUCOSE 92  --   BUN 20 29*  CREATININE 0.84 1.0  CALCIUM 10.0 8.7   Recent Labs    06/05/23 1548 08/11/23 0000  AST 35 23  ALT 22 14  ALKPHOS 61 56  BILITOT 0.9  --   PROT 5.8*  --   ALBUMIN 3.7 3.4*   Recent Labs    06/05/23 1548 08/11/23 0000  WBC 10.5 3.8  NEUTROABS 8.2* 1,915.00  HGB 11.8* 10.5*  HCT 37.0 32*  MCV 93.9  --   PLT 174 118*   Lab Results  Component Value Date   TSH 3.49 08/11/2023   Lab Results  Component Value Date   HGBA1C 5.8 08/11/2023   Lab Results  Component Value Date   CHOL 139 06/11/2022   HDL 56 06/11/2022   LDLCALC 70 06/11/2022   TRIG 57 06/11/2022   CHOLHDL 2.5 06/11/2022    Significant Diagnostic Results in last 30 days:  No results found.  Assessment/Plan  1. Iron deficiency anemia, unspecified iron deficiency anemia type (Primary) -  Iron Deficiency Anemia Confirmed by low iron levels, low percent saturation, and low ferritin. Mild anemia indicated by hemoglobin 10.6 and hematocrit 33.5. Likely due to iron deficiency. Asymptomatic and agreed to treatment. - Start ferrous sulfate 325 mg orally on Mondays, Wednesdays, and Fridays. - Repeat CBC in one month to assess response.  2. Gastroesophageal reflux disease without esophagitis -  stable -  continue Omeprazole 40 mg daily  3. Primary hypertension -  BP 126/77, stable -  continue Toprol XL 25 mg 0.5 tab daily and Losartan 100 mg daily     Family/ staff Communication:  Discussed plan of care with resident and charge nurse.  Labs/tests ordered:   CBC in a month.

## 2024-04-01 DIAGNOSIS — N3946 Mixed incontinence: Secondary | ICD-10-CM | POA: Diagnosis not present

## 2024-04-01 DIAGNOSIS — M6281 Muscle weakness (generalized): Secondary | ICD-10-CM | POA: Diagnosis not present

## 2024-04-03 DIAGNOSIS — G4733 Obstructive sleep apnea (adult) (pediatric): Secondary | ICD-10-CM | POA: Diagnosis not present

## 2024-04-05 DIAGNOSIS — N8110 Cystocele, unspecified: Secondary | ICD-10-CM | POA: Diagnosis not present

## 2024-04-07 DIAGNOSIS — I10 Essential (primary) hypertension: Secondary | ICD-10-CM | POA: Diagnosis not present

## 2024-04-07 DIAGNOSIS — R531 Weakness: Secondary | ICD-10-CM | POA: Diagnosis not present

## 2024-04-07 LAB — HEPATIC FUNCTION PANEL
ALT: 15 U/L (ref 7–35)
AST: 21 (ref 13–35)
Alkaline Phosphatase: 83 (ref 25–125)
Bilirubin, Total: 0.4

## 2024-04-07 LAB — CBC: RBC: 3.73 — AB (ref 3.87–5.11)

## 2024-04-07 LAB — COMPREHENSIVE METABOLIC PANEL WITH GFR
Calcium: 9.3 (ref 8.7–10.7)
eGFR: 50

## 2024-04-07 LAB — CBC AND DIFFERENTIAL
HCT: 33 — AB (ref 36–46)
Hemoglobin: 10.6 — AB (ref 12.0–16.0)
Neutrophils Absolute: 4112
Platelets: 244 10*3/uL (ref 150–400)
WBC: 6.6

## 2024-04-07 LAB — BASIC METABOLIC PANEL WITH GFR
BUN: 21 (ref 4–21)
CO2: 25 — AB (ref 13–22)
Chloride: 105 (ref 99–108)
Creatinine: 1 (ref 0.5–1.1)
Glucose: 90
Potassium: 3.8 meq/L (ref 3.5–5.1)
Sodium: 139 (ref 137–147)

## 2024-04-07 LAB — PROTEIN / CREATININE RATIO, URINE: Albumin, U: 1.9

## 2024-04-08 ENCOUNTER — Non-Acute Institutional Stay: Payer: Self-pay | Admitting: Nurse Practitioner

## 2024-04-08 ENCOUNTER — Encounter: Payer: Self-pay | Admitting: Nurse Practitioner

## 2024-04-08 DIAGNOSIS — I10 Essential (primary) hypertension: Secondary | ICD-10-CM

## 2024-04-08 DIAGNOSIS — F419 Anxiety disorder, unspecified: Secondary | ICD-10-CM | POA: Diagnosis not present

## 2024-04-08 DIAGNOSIS — M8580 Other specified disorders of bone density and structure, unspecified site: Secondary | ICD-10-CM

## 2024-04-08 DIAGNOSIS — F5105 Insomnia due to other mental disorder: Secondary | ICD-10-CM | POA: Diagnosis not present

## 2024-04-08 DIAGNOSIS — I4892 Unspecified atrial flutter: Secondary | ICD-10-CM | POA: Diagnosis not present

## 2024-04-08 DIAGNOSIS — Z780 Asymptomatic menopausal state: Secondary | ICD-10-CM

## 2024-04-08 DIAGNOSIS — K219 Gastro-esophageal reflux disease without esophagitis: Secondary | ICD-10-CM

## 2024-04-08 DIAGNOSIS — M545 Low back pain, unspecified: Secondary | ICD-10-CM | POA: Diagnosis not present

## 2024-04-08 NOTE — Progress Notes (Signed)
 Location:   [facility] Room Number: 820 Place of Service:  ALF (13) Provider: Abner Hoffman Toran Murch NP  Lizet Kelso X, NP  Patient Care Team: Nekeshia Lenhardt X, NP as PCP - General (Internal Medicine) Jossie Smoot X, NP as Nurse Practitioner (Internal Medicine)  Extended Emergency Contact Information Primary Emergency Contact: [facility] Address: [address]          [place], [place] [address] [place] Home Phone: [phone] Mobile Phone: [phone] Relation: Son  Code Status: DNR Goals of care: Advanced Directive information    10/27/2023   10:27 AM  Advanced Directives  Does Patient Have a Medical Advance Directive? Yes  Type of Estate agent of [place];Out of facility DNR (pink MOST or yellow form);Living will  Does patient want to make changes to medical advance directive? No - Patient declined  Copy of Healthcare Power of Attorney in Chart? Yes - validated most recent copy scanned in chart (See row information)  Pre-existing out of facility DNR order (yellow form or pink MOST form) Pink MOST/Yellow Form most recent copy in chart - Physician notified to receive inpatient order     Chief Complaint  Patient presents with   Acute Visit    C/o tingling and numbness in leg(actually the patient stated it was in her R shin, resolved after rest yesterday)    HPI:  Pt is a 89 y.o. female seen today for an acute visit for resolved tingling and numbness in the right shin after rest/elevating legs yesterday, denied focal weakness associated with the event. CBC/diff and CMP/eGFR 04/07/24 unremarkable.     The chronic pain in the back of lower neck, CT head/cervical spine 12/18/23 No acute fracture or traumatic subluxation of the cervical spine. 2. Advanced degenerative changes of the cervical spine with moderate spinal canal stenosis at C5-6 and mild spinal canal stenosis at C6-7. 3. High-grade bilateral neural foraminal narrowing from C2-3  through C6-7.                Xray 03/04/24 C+T spine, no compression fx or subluxation              Taking Tamadol for pain.              Ambulated with walker in room upon my visit today No further c/o dizziness, chills, nausea, or abd pain             Hx of near syncope: not new, no orthostatic hypotension, last episode of dizziness 12/10/23. 12/17/24 CT head: No acute intracranial pathology. 2. Advanced white matter disease, most likely related to chronic microangiopathy, similar to prior CT.              ED eval 06/05/23 for fall, CT head/cervical spine, thoracic spine showed no acute fracture, she was found to have UTI even if the patient stated she is asymptomatic, urine culture: E coli >100,000c/ml, 7 day course of Keflex             ED eval 01/16/23 near syncope, fall, EKG, CT head/cervical spine, X-ray R knee, CBC, CMP unremarkable.             CT head/cervical spine, incidental finding: 6 mm right solid pulmonary nodule within the upper lobe. Per Fleischner Society Guidelines, recommend a non-contrast Chest CT at 6-12 months-ordered. pending CT chest wo CM. CXR 04/02/23 no acute process.              Edema BLE, not apparent,  TED             OSA, CPAP, seeing pulmonology.              A-flutter 01/05/22, no recurrence, delay cardiology OP declined Alendronate after initial consented.  Back pain mid to lower portion, better, X-ray T/L spine showed multilevel degenerative changes, no acute findings CT head/C+T+L spine, and pelvis,  taking Tylenol, Tramadol             Bone spur R foot, callus of foot, f/u podiatrist.              Allergic rhinitis, off Fluticasone nasal spray, Claritin, Atrovent nasal spray.              Anxiety, Lorazepam prn available to her.              Elevated TSH 5.07 06/11/22, TSH 5.11 02/10/23, TSH 3.49 08/11/23,  not on supplement.             HTN, dc'd Losartan by Dr. Linder Revere 04/02/23? The patient is still taking Losartan 100mg  qd,  continue Metoprolol,  takes ASA, Bun/creat  21/1.21 04/07/24             GERD, stable on Omeprazole, Hgb 10.6 04/07/24             Hyperlipidemia, off Atorvastatin, LDL 70 06/11/22             Prediabetes, diet controlled, Hgb a1c 5.8 08/11/23 Past Medical History:  Diagnosis Date   GERD (gastroesophageal reflux disease)    Hx of cataract surgery    both eyes   Hyperglycemia    Hyperlipidemia    Hypertension    Lumbago 04/05/2016   Macular degeneration    ? which eye(s)   Obstructive sleep apnea    Osteoporosis, senile    Overactive bladder    Squamous cell skin cancer 2/16   left lower leg   Past Surgical History:  Procedure Laterality Date   ABDOMINAL HYSTERECTOMY  1960's   APPENDECTOMY  1960's   MOLE REMOVAL  2000   facial   TONSILLECTOMY      Allergies  Allergen Reactions   Adhesive [Tape]    Ampicillin    Elemental Sulfur    Nitrofuran Derivatives    Zoloft [Sertraline Hcl] Other (See Comments)    Dizziness, nausea, faint feeling, and nightmares      Allergies as of 04/08/2024       Reactions   Adhesive [tape]    Ampicillin    Elemental Sulfur    Nitrofuran Derivatives    Zoloft [sertraline Hcl] Other (See Comments)   Dizziness, nausea, faint feeling, and nightmares          Medication List        Accurate as of April 08, 2024 11:59 PM. If you have any questions, ask your nurse or doctor.          acetaminophen 500 MG tablet Commonly known as: TYLENOL Take 500 mg by mouth daily.   acetaminophen 325 MG tablet Commonly known as: TYLENOL Take 325 mg by mouth every 4 (four) hours as needed. 2 tablets by mouth every 4 hours as need for fever and pain   aspirin EC 81 MG tablet Take 1 tablet (81 mg total) by mouth daily.   Calcium Carb-Cholecalciferol 600-800 MG-UNIT Tabs Take 1 tablet by mouth 2 (two) times daily.   ferrous sulfate 324 MG Tbec Take 324 mg by mouth 3 (three) times a week.  1 tablet by mouth one time a day every Mon, Wed, Fri for Iron Deficiency Anemia.   loperamide 2 MG  tablet Commonly known as: IMODIUM A-D Take 2 mg by mouth every 6 (six) hours as needed for diarrhea or loose stools.   LORazepam 0.5 MG tablet Commonly known as: ATIVAN TAKE ONE TABLET ONCE DAILY AS NEEDED FOR ANXIETY.   losartan 100 MG tablet Commonly known as: COZAAR Take 100 mg by mouth daily.   metoprolol succinate 25 MG 24 hr tablet Commonly known as: TOPROL-XL TAKE 1/2 TABLET ONCE A DAY.   Ocuvite Adult 50+ Caps Take 1 capsule by mouth every evening.   omeprazole 40 MG capsule Commonly known as: PRILOSEC TAKE 1 CAPSULE DAILY 1/2 HOUR BEFORE BREAKFAST FOR STOMACH ACID.   Systane 0.4-0.3 % Gel ophthalmic gel Generic drug: Polyethyl Glycol-Propyl Glycol Place 1 application into both eyes daily.   traMADol 50 MG tablet Commonly known as: ULTRAM Take 1 tablet (50 mg total) by mouth every 6 (six) hours as needed. What changed:  how much to take when to take this   TRIAMCINOLONE ACETONIDE(NASAL) NA Place 2 sprays into the nose. Each nostril        Review of Systems  Constitutional:  Negative for appetite change, fatigue and fever.  HENT:  Negative for congestion, hearing loss, trouble swallowing and voice change.   Eyes:  Negative for visual disturbance.  Respiratory:  Negative for cough and shortness of breath.   Cardiovascular:  Negative for leg swelling.  Gastrointestinal:  Negative for abdominal pain, blood in stool, diarrhea, nausea and vomiting.       X1-2 times in the past 3 months, diet adjustment helped.   Genitourinary:  Negative for dysuria and urgency.       Worsened from occasionally urinary leakage to frequent urinary incontinence even if more at night.   Musculoskeletal:  Positive for arthralgias, back pain, gait problem and neck pain.  Skin:  Negative for color change.  Neurological:  Negative for speech difficulty, weakness and light-headedness.       Tingling in toes sometimes, better after moves around.  Tingling and numbness in the right shin  04/06/24 resolved after rest and elevated leg.   Psychiatric/Behavioral:  Negative for confusion and sleep disturbance. The patient is not nervous/anxious.     Immunization History  Administered Date(s) Administered   Fluad Quad(high Dose 65+) 10/12/2019, 10/14/2022   Influenza Whole 09/30/2018   Influenza, High Dose Seasonal PF 10/07/2017, 09/28/2021   Influenza-Unspecified 09/27/2015, 10/09/2016, 10/10/2020, 09/30/2023   Moderna Covid-19 Vaccine Bivalent Booster 4yrs & up 10/30/2022   Moderna SARS-COV2 Booster Vaccination 10/14/2023   Moderna Sars-Covid-2 Vaccination 01/02/2020, 01/30/2020, 11/06/2020   Pfizer Fall 2024 Covid-19 Vaccine 6mos thru 66yrs 05/16/2022   Pneumococcal Conjugate-13 01/23/2015   Pneumococcal Polysaccharide-23 10/20/2007   Respiratory Syncytial Virus Vaccine,Recomb Aduvanted(Arexvy) 12/26/2022   Td 12/14/2012   Tdap 11/05/2023   Pertinent  Health Maintenance Due  Topic Date Due   INFLUENZA VACCINE  07/29/2024   DEXA SCAN  Completed      03/05/2023    2:48 PM 03/12/2023    3:32 PM 04/23/2023    1:49 PM 05/20/2023    2:12 PM 08/14/2023   12:41 PM  Fall Risk  Falls in the past year? 0 0 0 0 0  Was there an injury with Fall? 0 0 0 0 0  Fall Risk Category Calculator 0 0 0 0 0  Patient at Risk for Falls Due to No Fall Risks  Impaired balance/gait;Impaired mobility History of fall(s) History of fall(s)   Fall risk Follow up Falls evaluation completed Falls evaluation completed Falls evaluation completed Falls evaluation completed Falls evaluation completed   Functional Status Survey:    Vitals:   04/08/24 1624  BP: 134/65  Pulse: 70  Resp: 20  Temp: 97.8 F (36.6 C)  SpO2: 97%  Weight: 171 lb 9.6 oz (77.8 kg)   Body mass index is 27.7 kg/m. Physical Exam Vitals and nursing note reviewed.  Constitutional:      Appearance: Normal appearance.  HENT:     Head: Normocephalic and atraumatic.     Mouth/Throat:     Mouth: Mucous membranes are moist.   Eyes:     Extraocular Movements: Extraocular movements intact.     Conjunctiva/sclera: Conjunctivae normal.     Pupils: Pupils are equal, round, and reactive to light.  Cardiovascular:     Rate and Rhythm: Normal rate and regular rhythm.     Heart sounds: No murmur heard. Pulmonary:     Effort: Pulmonary effort is normal.     Breath sounds: Normal breath sounds. No rales.  Abdominal:     General: Bowel sounds are normal.     Palpations: Abdomen is soft.     Tenderness: There is no abdominal tenderness. There is no right CVA tenderness, left CVA tenderness, guarding or rebound.  Musculoskeletal:        General: Tenderness present. No swelling, deformity or signs of injury.     Cervical back: Normal range of motion and neck supple.     Right lower leg: No edema.     Left lower leg: No edema.     Comments: Spinal spinous process C6/7 tenderness palpated and with head moved side to side and up/down.   Skin:    General: Skin is warm and dry.  Neurological:     General: No focal deficit present.     Mental Status: She is alert and oriented to person, place, and time. Mental status is at baseline.     Gait: Gait abnormal.  Psychiatric:        Mood and Affect: Mood normal.        Behavior: Behavior normal.        Thought Content: Thought content normal.        Judgment: Judgment normal.     Labs reviewed: Recent Labs    06/05/23 1548 08/11/23 0000  NA 144 141  K 4.5 4.2  CL 109 106  CO2 25 28*  GLUCOSE 92  --   BUN 20 29*  CREATININE 0.84 1.0  CALCIUM 10.0 8.7   Recent Labs    06/05/23 1548 08/11/23 0000  AST 35 23  ALT 22 14  ALKPHOS 61 56  BILITOT 0.9  --   PROT 5.8*  --   ALBUMIN 3.7 3.4*   Recent Labs    06/05/23 1548 08/11/23 0000  WBC 10.5 3.8  NEUTROABS 8.2* 1,915.00  HGB 11.8* 10.5*  HCT 37.0 32*  MCV 93.9  --   PLT 174 118*   Lab Results  Component Value Date   TSH 3.49 08/11/2023   Lab Results  Component Value Date   HGBA1C 5.8  08/11/2023   Lab Results  Component Value Date   CHOL 139 06/11/2022   HDL 56 06/11/2022   LDLCALC 70 06/11/2022   TRIG 57 06/11/2022   CHOLHDL 2.5 06/11/2022    Significant Diagnostic Results in last 30 days:  No results  found.  Assessment/Plan: Lumbago  resolved tingling and numbness in the right shin after rest/elevating legs yesterday, denied focal weakness associated with the event. CBC/diff and CMP/eGFR 04/07/24 unremarkable.  Chronic, no change, prn Tramadol    Osteopenia after menopause declined Alendronate after initial consented.   Atrial flutter (HCC)  01/05/22, no recurrence, delay cardiology  Insomnia secondary to anxiety  Lorazepam prn available to her.   HTN (hypertension) Blood pressure is controlled, continue Metoprolol, Losartan, followed by Cardiology, Bun/creat 21/1.21 04/07/24  GERD (gastroesophageal reflux disease) Stable, continue Omeprazole, Hgb 10.6 04/07/24    Family/ staff Communication: plan of care reviewed with the patient and charge nurse.   Labs/tests ordered:  CBC/diff, CMP/eGFR done 04/07/24

## 2024-04-11 NOTE — Assessment & Plan Note (Signed)
 Stable, continue Omeprazole, Hgb 10.6 04/07/24

## 2024-04-11 NOTE — Assessment & Plan Note (Signed)
declined Alendronate after initial consented.  

## 2024-04-11 NOTE — Assessment & Plan Note (Signed)
 Blood pressure is controlled, continue Metoprolol, Losartan, followed by Cardiology, Bun/creat 21/1.21 04/07/24

## 2024-04-11 NOTE — Assessment & Plan Note (Signed)
Lorazepam prn available to her.  

## 2024-04-11 NOTE — Assessment & Plan Note (Signed)
 resolved tingling and numbness in the right shin after rest/elevating legs yesterday, denied focal weakness associated with the event. CBC/diff and CMP/eGFR 04/07/24 unremarkable.  Chronic, no change, prn Tramadol

## 2024-04-11 NOTE — Assessment & Plan Note (Signed)
01/05/22, no recurrence, delay cardiology

## 2024-04-18 ENCOUNTER — Telehealth: Payer: Self-pay | Admitting: Pharmacist

## 2024-04-18 DIAGNOSIS — I10 Essential (primary) hypertension: Secondary | ICD-10-CM

## 2024-04-18 NOTE — Progress Notes (Signed)
   04/18/2024  Patient ID: Jean [name], female   DOB: Jun 01, 1924, 89 y.o.   MRN: [id number]  Pharmacy Quality Measure Review  This patient is appearing on a report for being at risk of failing the adherence measure for hypertension (ACEi/ARB) medications this calendar year.   Medication: Losartan  100 mg Last fill date: 02/26/24 for 28 day supply at [facility]. Dr. Anson Basta does not show any fill history. Patient most likely lives in a nursing home.  Plan:  Will follow up in 2-3 months.  Jean [name], PharmD, BCACP Clinical Pharmacist [phone]    .

## 2024-04-28 DIAGNOSIS — R634 Abnormal weight loss: Secondary | ICD-10-CM | POA: Diagnosis not present

## 2024-04-28 DIAGNOSIS — D509 Iron deficiency anemia, unspecified: Secondary | ICD-10-CM | POA: Diagnosis not present

## 2024-04-28 LAB — CBC AND DIFFERENTIAL
HCT: 30 — AB (ref 36–46)
Hemoglobin: 9.5 — AB (ref 12.0–16.0)
Neutrophils Absolute: 2751
Platelets: 180 10*3/uL (ref 150–400)
WBC: 5.3

## 2024-04-28 LAB — CBC: RBC: 3.33 — AB (ref 3.87–5.11)

## 2024-05-02 DIAGNOSIS — N3946 Mixed incontinence: Secondary | ICD-10-CM | POA: Diagnosis not present

## 2024-05-02 DIAGNOSIS — M6281 Muscle weakness (generalized): Secondary | ICD-10-CM | POA: Diagnosis not present

## 2024-05-03 DIAGNOSIS — G4733 Obstructive sleep apnea (adult) (pediatric): Secondary | ICD-10-CM | POA: Diagnosis not present

## 2024-05-30 DIAGNOSIS — N3946 Mixed incontinence: Secondary | ICD-10-CM | POA: Diagnosis not present

## 2024-05-30 DIAGNOSIS — M6281 Muscle weakness (generalized): Secondary | ICD-10-CM | POA: Diagnosis not present

## 2024-06-13 DIAGNOSIS — G4733 Obstructive sleep apnea (adult) (pediatric): Secondary | ICD-10-CM | POA: Diagnosis not present

## 2024-06-16 ENCOUNTER — Encounter: Admitting: Nurse Practitioner

## 2024-06-17 NOTE — Progress Notes (Signed)
 This encounter was created in error - please disregard.

## 2024-06-20 ENCOUNTER — Encounter: Payer: Self-pay | Admitting: Sports Medicine

## 2024-06-20 ENCOUNTER — Telehealth: Payer: Self-pay | Admitting: Pharmacist

## 2024-06-20 ENCOUNTER — Non-Acute Institutional Stay: Payer: Self-pay | Admitting: Sports Medicine

## 2024-06-20 DIAGNOSIS — M150 Primary generalized (osteo)arthritis: Secondary | ICD-10-CM

## 2024-06-20 DIAGNOSIS — K219 Gastro-esophageal reflux disease without esophagitis: Secondary | ICD-10-CM

## 2024-06-20 DIAGNOSIS — Z79899 Other long term (current) drug therapy: Secondary | ICD-10-CM

## 2024-06-20 DIAGNOSIS — F411 Generalized anxiety disorder: Secondary | ICD-10-CM

## 2024-06-20 DIAGNOSIS — I10 Essential (primary) hypertension: Secondary | ICD-10-CM | POA: Diagnosis not present

## 2024-06-20 NOTE — Progress Notes (Signed)
   06/20/2024  Patient ID: Jean [name], female   DOB: [birth year], 89 y.o.   MRN: [id number]  Pharmacy Quality Measure Review  This patient is appearing on a report for being at risk of failing the adherence measure for hypertension (ACEi/ARB) medications this calendar year.   Medication: Losartan  100 mg Last fill date: for 05/27/34 day supply  No fill history in Dr. Annemarie. Patient in in a facility and gets meds filled at [facility].  Due to the monthly billing there may be the appearance of refill delays. Meds are filled in pill packing and billed monthly.  Jean [name], PharmD, BCACP Clinical Pharmacist [phone]

## 2024-06-20 NOTE — Progress Notes (Unsigned)
 Location:  [facility]  Nursing Home Room Number: [id number] Place of Service:  ALF [phone]) Provider:  Sherlynn Madden, MD  Mast, Man X, NP  Patient Care Team: Mast, Man X, NP as PCP - General (Internal Medicine) Mast, Man X, NP as Nurse Practitioner (Internal Medicine)  Extended Emergency Contact Information Primary Emergency Contact: [facility] Address: [address]          [place], [place] [address] [place] Home Phone: [phone] Mobile Phone: [phone] Relation: Son  Code Status:   Goals of care: Advanced Directive information    06/20/2024    2:42 PM  Advanced Directives  Does Patient Have a Medical Advance Directive? Yes  Type of Advance Directive Living will;Out of facility DNR (pink MOST or yellow form)  Does patient want to make changes to medical advance directive? No - Patient declined  Pre-existing out of facility DNR order (yellow form or pink MOST form) Pink MOST/Yellow Form most recent copy in chart - Physician notified to receive inpatient order     Chief Complaint  Patient presents with   Medical Management of Chronic Issues    Routine Visit     HPI:  Pt is a 89 y.o. female with past medical history of osteoarthritis, major neurocognitive disorder, generalized anxiety disorder, hypertension, GERD is seen today for medical management of chronic diseases.   Patient seen and examined in her room.  She seems pleasant and comfortable and does not appear to be in distress. She ambulates with a rollator walker.  Denies being dizzy, lightheaded. No recent falls.  Denies joint pains. Patient states she likes to talk to people and engage in some group activities.  States her mood is better she is currently on lorazepam  as needed. Patient denies chest pain, shortness of breath, abdominal pain, nausea, vomiting, dysuria, hematuria, bloody or dark-colored stools.  Past Medical History:  Diagnosis Date   GERD (gastroesophageal reflux  disease)    Hx of cataract surgery    both eyes   Hyperglycemia    Hyperlipidemia    Hypertension    Lumbago 04/05/2016   Macular degeneration    ? which eye(s)   Obstructive sleep apnea    Osteoporosis, senile    Overactive bladder    Squamous cell skin cancer 2/16   left lower leg   Past Surgical History:  Procedure Laterality Date   ABDOMINAL HYSTERECTOMY  1960's   APPENDECTOMY  1960's   MOLE REMOVAL  2000   facial   TONSILLECTOMY      Allergies  Allergen Reactions   Adhesive [Tape]    Ampicillin    Elemental Sulfur    Nitrofuran Derivatives    Zoloft  [Sertraline  Hcl] Other (See Comments)    Dizziness, nausea, faint feeling, and nightmares      Allergies as of 06/20/2024       Reactions   Adhesive [tape]    Ampicillin    Elemental Sulfur    Nitrofuran Derivatives    Zoloft  [sertraline  Hcl] Other (See Comments)   Dizziness, nausea, faint feeling, and nightmares          Medication List        Accurate as of June 20, 2024  3:06 PM. If you have any questions, ask your nurse or doctor.          acetaminophen  500 MG tablet Commonly known as: TYLENOL  Take 500 mg by mouth daily.   acetaminophen  325 MG tablet Commonly known as: TYLENOL  Take 325 mg by  mouth every 4 (four) hours as needed. 2 tablets by mouth every 4 hours as need for fever and pain   aspirin  EC 81 MG tablet Take 1 tablet (81 mg total) by mouth daily.   Calcium  Carb-Cholecalciferol 600-800 MG-UNIT Tabs Take 1 tablet by mouth 2 (two) times daily.   ferrous sulfate  324 MG Tbec Take 324 mg by mouth 3 (three) times a week. 1 tablet by mouth one time a day every Mon, Wed, Fri for Iron Deficiency Anemia.   ferrous sulfate  325 (65 FE) MG EC tablet Take 325 mg by mouth once.   loperamide 2 MG tablet Commonly known as: IMODIUM A-D Take 2 mg by mouth every 6 (six) hours as needed for diarrhea or loose stools.   LORazepam  0.5 MG tablet Commonly known as: ATIVAN  TAKE ONE TABLET ONCE DAILY  AS NEEDED FOR ANXIETY.   losartan  100 MG tablet Commonly known as: COZAAR  Take 100 mg by mouth daily.   metoprolol  succinate 25 MG 24 hr tablet Commonly known as: TOPROL -XL TAKE 1/2 TABLET ONCE A DAY.   Ocuvite Adult 50+ Caps Take 1 capsule by mouth every evening.   omeprazole  40 MG capsule Commonly known as: PRILOSEC TAKE 1 CAPSULE DAILY 1/2 HOUR BEFORE BREAKFAST FOR STOMACH ACID.   Systane 0.4-0.3 % Gel ophthalmic gel Generic drug: Polyethyl Glycol-Propyl Glycol Place 1 application into both eyes daily.   traMADol  50 MG tablet Commonly known as: ULTRAM  Take 1 tablet (50 mg total) by mouth every 6 (six) hours as needed.   TRIAMCINOLONE ACETONIDE(NASAL) NA Place 2 sprays into the nose. Each nostril        Review of Systems  Constitutional:  Negative for chills and fever.  HENT:  Negative for sinus pressure and sore throat.   Respiratory:  Negative for cough, shortness of breath and wheezing.   Cardiovascular:  Negative for chest pain, palpitations and leg swelling.  Gastrointestinal:  Negative for abdominal distention, abdominal pain, blood in stool, constipation, diarrhea and nausea.  Genitourinary:  Negative for dysuria, frequency and urgency.  Neurological:  Negative for dizziness.  Psychiatric/Behavioral:  Negative for confusion.     Immunization History  Administered Date(s) Administered   Fluad Quad(high Dose 65+) 10/12/2019, 10/14/2022   Influenza Whole 09/30/2018   Influenza, High Dose Seasonal PF 10/07/2017, 09/28/2021   Influenza-Unspecified 09/27/2015, 10/09/2016, 10/10/2020, 09/30/2023   Moderna Covid-19 Fall Seasonal Vaccine 16yrs & older 10/14/2023   Moderna Covid-19 Vaccine Bivalent Booster 58yrs & up 10/30/2022   Moderna SARS-COV2 Booster Vaccination 10/14/2023   Moderna Sars-Covid-2 Vaccination 01/02/2020, 01/30/2020, 11/06/2020, 05/28/2021, 05/16/2022   PFIZER(Purple Top)SARS-COV-2 Vaccination 09/17/2021   Pfizer Fall 2024 Covid-19 Vaccine 6mos  thru 35yrs 05/16/2022   Pneumococcal Conjugate-13 01/23/2015   Pneumococcal Polysaccharide-23 10/20/2007   Respiratory Syncytial Virus Vaccine,Recomb Aduvanted(Arexvy) 12/26/2022   Td 12/14/2012   Tdap 11/05/2023   Pertinent  Health Maintenance Due  Topic Date Due   INFLUENZA VACCINE  07/29/2024   DEXA SCAN  Completed      03/05/2023    2:48 PM 03/12/2023    3:32 PM 04/23/2023    1:49 PM 05/20/2023    2:12 PM 08/14/2023   12:41 PM  Fall Risk  Falls in the past year? 0 0 0 0 0  Was there an injury with Fall? 0 0 0 0 0  Fall Risk Category Calculator 0 0 0 0 0  Patient at Risk for Falls Due to No Fall Risks Impaired balance/gait;Impaired mobility History of fall(s) History of fall(s)  Fall risk Follow up Falls evaluation completed Falls evaluation completed Falls evaluation completed Falls evaluation completed Falls evaluation completed   Functional Status Survey:    Vitals:   06/20/24 1448  BP: (!) 153/78  Pulse: 71  Resp: 20  Temp: 97.7 F (36.5 C)  SpO2: 95%  Weight: 168 lb 3.2 oz (76.3 kg)  Height: 5' 6 (1.676 m)   Body mass index is 27.15 kg/m. Physical Exam Constitutional:      Appearance: Normal appearance.  HENT:     Head: Normocephalic and atraumatic.   Cardiovascular:     Rate and Rhythm: Normal rate and regular rhythm.  Pulmonary:     Effort: Pulmonary effort is normal. No respiratory distress.     Breath sounds: Normal breath sounds. No wheezing.  Abdominal:     General: Bowel sounds are normal. There is no distension.     Tenderness: There is no abdominal tenderness. There is no guarding or rebound.     Comments:     Musculoskeletal:     Comments: Dorsal puffiness of bilateral feet   Skin:    General: Skin is dry.   Neurological:     Mental Status: She is alert. Mental status is at baseline.     Motor: No weakness.     Labs reviewed: Recent Labs    08/11/23 0000 03/29/24 0000 04/07/24 0000  NA 141 142 139  K 4.2 4.2 3.8  CL 106 106  105  CO2 28* 29* 25*  BUN 29* 18 21  CREATININE 1.0 0.8 1.0  CALCIUM  8.7 9.5 9.3   Recent Labs    08/11/23 0000 03/29/24 0000 04/07/24 0000  AST 23 19 21   ALT 14 13 15   ALKPHOS 56 73 83  ALBUMIN 3.4* 4.1  --    Recent Labs    03/29/24 0000 04/07/24 0000 04/28/24 0000  WBC 6.6 6.6 5.3  NEUTROABS 3,683.00 4,112.00 2,751.00  HGB 10.1* 10.6* 9.5*  HCT 32* 33* 30*  PLT 235 244 180   Lab Results  Component Value Date   TSH 3.49 08/11/2023   Lab Results  Component Value Date   HGBA1C 5.8 08/11/2023   Lab Results  Component Value Date   CHOL 139 06/11/2022   HDL 56 06/11/2022   LDLCALC 70 06/11/2022   TRIG 57 06/11/2022   CHOLHDL 2.5 06/11/2022    Significant Diagnostic Results in last 30 days:  No results found.  Assessment/Plan Hypertension Patient denies being dizzy, lightheaded Continue with losartan , Toprol      Latest Ref Rng & Units 04/07/2024   12:00 AM 03/29/2024   12:00 AM 08/11/2023   12:00 AM  BMP  BUN 4 - 21 21     18     29       Creatinine 0.5 - 1.1 1.0     0.8     1.0      Sodium 137 - 147 139     142     141      Potassium 3.5 - 5.1 mEq/L 3.8     4.2     4.2      Chloride 99 - 108 105     106     106      CO2 13 - 22 25     29     28       Calcium  8.7 - 10.7 9.3     9.5     8.7  This result is from an external source.     History of anemia No signs of bleeding Last hemoglobin 9.3 Will order iron studies, B12  Osteoarthritis Patient states pain is manageable Continue with tramadol , Tylenol  as needed  Generalized anxiety disorder Mood is stable Continue with Ativan  as needed  GERD patient denies heartburn, acid reflux Continue with omeprazole .   30 min Total time spent for obtaining history,  performing a medically appropriate examination and evaluation, reviewing the tests, documenting clinical information in the electronic or other health record,  ,care coordination (not separately reported)

## 2024-06-23 ENCOUNTER — Encounter: Payer: Self-pay | Admitting: Sports Medicine

## 2024-06-27 ENCOUNTER — Other Ambulatory Visit: Payer: PPO

## 2024-06-28 DIAGNOSIS — M6281 Muscle weakness (generalized): Secondary | ICD-10-CM | POA: Diagnosis not present

## 2024-06-28 DIAGNOSIS — N3946 Mixed incontinence: Secondary | ICD-10-CM | POA: Diagnosis not present

## 2024-06-30 LAB — IRON,TIBC AND FERRITIN PANEL
Ferritin: 12
Iron: 37
TIBC: 306

## 2024-06-30 LAB — CBC AND DIFFERENTIAL
HCT: 37 (ref 36–46)
Hemoglobin: 11.9 — AB (ref 12.0–16.0)
Neutrophils Absolute: 4508
Platelets: 212 K/uL (ref 150–400)
WBC: 7.5

## 2024-06-30 LAB — CBC: RBC: 3.99 (ref 3.87–5.11)

## 2024-07-09 DIAGNOSIS — N390 Urinary tract infection, site not specified: Secondary | ICD-10-CM | POA: Diagnosis not present

## 2024-07-09 LAB — CBC AND DIFFERENTIAL
HCT: 36 (ref 36–46)
Hemoglobin: 11.8 — AB (ref 12.0–16.0)
Neutrophils Absolute: 4162
Platelets: 193 K/uL (ref 150–400)
WBC: 6.8

## 2024-07-09 LAB — HEPATIC FUNCTION PANEL
ALT: 14 U/L (ref 7–35)
AST: 23 (ref 13–35)
Alkaline Phosphatase: 72 (ref 25–125)
Bilirubin, Total: 0.5

## 2024-07-09 LAB — COMPREHENSIVE METABOLIC PANEL WITH GFR
Albumin: 4.4 (ref 3.5–5.0)
Calcium: 10 (ref 8.7–10.7)
Globulin: 1.9
eGFR: 64

## 2024-07-09 LAB — BASIC METABOLIC PANEL WITH GFR
BUN: 18 (ref 4–21)
CO2: 19 (ref 13–22)
Chloride: 107 (ref 99–108)
Creatinine: 0.8 (ref 0.5–1.1)
Glucose: 124
Potassium: 4.1 meq/L (ref 3.5–5.1)
Sodium: 138 (ref 137–147)

## 2024-07-09 LAB — CBC: RBC: 3.91 (ref 3.87–5.11)

## 2024-07-13 ENCOUNTER — Other Ambulatory Visit: Payer: Self-pay | Admitting: Adult Health

## 2024-07-14 ENCOUNTER — Non-Acute Institutional Stay: Payer: Self-pay | Admitting: Sports Medicine

## 2024-07-14 DIAGNOSIS — F0390 Unspecified dementia without behavioral disturbance: Secondary | ICD-10-CM

## 2024-07-14 DIAGNOSIS — R8290 Unspecified abnormal findings in urine: Secondary | ICD-10-CM

## 2024-07-14 DIAGNOSIS — M159 Polyosteoarthritis, unspecified: Secondary | ICD-10-CM

## 2024-07-14 DIAGNOSIS — F411 Generalized anxiety disorder: Secondary | ICD-10-CM | POA: Diagnosis not present

## 2024-07-14 DIAGNOSIS — I10 Essential (primary) hypertension: Secondary | ICD-10-CM | POA: Diagnosis not present

## 2024-07-14 NOTE — Progress Notes (Unsigned)
 Location:  [facility]  Nursing Home Room Number: [id number] Place of Service:  ALF [phone]) Provider:  Sherlynn Albert, MD   Mast, Man X, NP  Patient Care Team: Mast, Man X, NP as PCP - General (Internal Medicine) Mast, Man X, NP as Nurse Practitioner (Internal Medicine)  Extended Emergency Contact Information Primary Emergency Contact: [facility] Address: [address]          [place], [place] [address] [place] Home Phone: [phone] Mobile Phone: [phone] Relation: Son  Code Status:  DNR Goals of care: Advanced Directive information    07/14/2024   10:01 AM  Advanced Directives  Does Patient Have a Medical Advance Directive? Yes  Type of Advance Directive Out of facility DNR (pink MOST or yellow form);Living will  Does patient want to make changes to medical advance directive? No - Patient declined     Chief Complaint  Patient presents with   Acute Visit    Patient has UTI concerns.     HPI:  Pt is a 89 y.o. female with past medical history of osteoarthritis, major neurocognitive disorder, generalized anxiety, hypertension, GERD is seen today for an acute visit for follow-up on abnormal UA. Patient seen and examined in her room.  She seems pleasant and comfortable and does not appear to be in distress. She is sitting comfortably in a recliner chair.  She ambulates with the help of walker. She walks to the dining room to eat her meals.  Denies fevers, chills, nausea, vomiting, abdominal pain, dysuria, frequency, urgency, hematuria, bloody or dark-colored stools. Patient has a history of dementia with intermittent periods of confusion. As per nursing staff patient is baseline no new confusion noted.  Pt knows her name, oriented to time and place.   Past Medical History:  Diagnosis Date   GERD (gastroesophageal reflux disease)    Hx of cataract surgery    both eyes   Hyperglycemia    Hyperlipidemia    Hypertension    Lumbago 04/05/2016    Macular degeneration    ? which eye(s)   Obstructive sleep apnea    Osteoporosis, senile    Overactive bladder    Squamous cell skin cancer 2/16   left lower leg   Past Surgical History:  Procedure Laterality Date   ABDOMINAL HYSTERECTOMY  1960's   APPENDECTOMY  1960's   MOLE REMOVAL  2000   facial   TONSILLECTOMY      Allergies  Allergen Reactions   Adhesive [Tape]    Ampicillin    Elemental Sulfur    Nitrofuran Derivatives    Zoloft  [Sertraline  Hcl] Other (See Comments)    Dizziness, nausea, faint feeling, and nightmares      Allergies as of 07/14/2024       Reactions   Adhesive [tape]    Ampicillin    Elemental Sulfur    Nitrofuran Derivatives    Zoloft  [sertraline  Hcl] Other (See Comments)   Dizziness, nausea, faint feeling, and nightmares          Medication List        Accurate as of July 14, 2024 10:25 AM. If you have any questions, ask your nurse or doctor.          acetaminophen  500 MG tablet Commonly known as: TYLENOL  Take 500 mg by mouth daily.   acetaminophen  325 MG tablet Commonly known as: TYLENOL  Take 325 mg by mouth every 4 (four) hours as needed. 2 tablets by mouth every 4 hours as need  for fever and pain   aspirin  EC 81 MG tablet Take 1 tablet (81 mg total) by mouth daily.   Calcium  Carb-Cholecalciferol 600-800 MG-UNIT Tabs Take 1 tablet by mouth 2 (two) times daily.   ferrous sulfate  324 MG Tbec Take 324 mg by mouth 3 (three) times a week. 1 tablet by mouth one time a day every Mon, Wed, Fri for Iron Deficiency Anemia.   ferrous sulfate  325 (65 FE) MG EC tablet Take 325 mg by mouth once.   loperamide 2 MG tablet Commonly known as: IMODIUM A-D Take 2 mg by mouth every 6 (six) hours as needed for diarrhea or loose stools.   LORazepam  0.5 MG tablet Commonly known as: ATIVAN  TAKE ONE TABLET ONCE DAILY AS NEEDED FOR ANXIETY.   losartan  100 MG tablet Commonly known as: COZAAR  Take 100 mg by mouth daily.   metoprolol   succinate 25 MG 24 hr tablet Commonly known as: TOPROL -XL TAKE 1/2 TABLET ONCE A DAY.   Ocuvite Adult 50+ Caps Take 1 capsule by mouth every evening.   omeprazole  40 MG capsule Commonly known as: PRILOSEC TAKE 1 CAPSULE DAILY 1/2 HOUR BEFORE BREAKFAST FOR STOMACH ACID.   Systane 0.4-0.3 % Gel ophthalmic gel Generic drug: Polyethyl Glycol-Propyl Glycol Place 1 application into both eyes daily.   traMADol  50 MG tablet Commonly known as: ULTRAM  Take 1 tablet (50 mg total) by mouth every 6 (six) hours as needed.   TRIAMCINOLONE ACETONIDE(NASAL) NA Place 2 sprays into the nose. Each nostril        Review of Systems  Constitutional:  Negative for chills and fever.  Respiratory:  Negative for cough, shortness of breath and wheezing.   Cardiovascular:  Negative for chest pain, palpitations and leg swelling.  Gastrointestinal:  Negative for abdominal distention, abdominal pain, blood in stool, constipation, diarrhea, nausea and vomiting.  Genitourinary:  Negative for dysuria.  Neurological:  Negative for dizziness.    Immunization History  Administered Date(s) Administered   Fluad Quad(high Dose 65+) 10/12/2019, 10/14/2022   Influenza Whole 09/30/2018   Influenza, High Dose Seasonal PF 10/07/2017, 09/28/2021   Influenza-Unspecified 09/27/2015, 10/09/2016, 10/10/2020, 09/30/2023   Moderna Covid-19 Fall Seasonal Vaccine 61yrs & older 10/14/2023   Moderna Covid-19 Vaccine Bivalent Booster 75yrs & up 10/30/2022   Moderna SARS-COV2 Booster Vaccination 10/14/2023   Moderna Sars-Covid-2 Vaccination 01/02/2020, 01/30/2020, 11/06/2020, 05/28/2021, 05/16/2022   PFIZER(Purple Top)SARS-COV-2 Vaccination 09/17/2021   Pfizer Fall 2024 Covid-19 Vaccine 6mos thru 47yrs 05/16/2022   Pneumococcal Conjugate-13 01/23/2015   Pneumococcal Polysaccharide-23 10/20/2007   Respiratory Syncytial Virus Vaccine,Recomb Aduvanted(Arexvy) 12/26/2022   Td 12/14/2012   Tdap 11/05/2023   Pertinent  Health  Maintenance Due  Topic Date Due   INFLUENZA VACCINE  07/29/2024   DEXA SCAN  Completed      03/05/2023    2:48 PM 03/12/2023    3:32 PM 04/23/2023    1:49 PM 05/20/2023    2:12 PM 08/14/2023   12:41 PM  Fall Risk  Falls in the past year? 0 0 0 0 0  Was there an injury with Fall? 0 0 0 0 0  Fall Risk Category Calculator 0 0 0 0 0  Patient at Risk for Falls Due to No Fall Risks Impaired balance/gait;Impaired mobility History of fall(s) History of fall(s)   Fall risk Follow up Falls evaluation completed Falls evaluation completed Falls evaluation completed Falls evaluation completed Falls evaluation completed   Functional Status Survey:    Vitals:   07/14/24 1018  BP: 137/73  Pulse: 72  Resp: 18  Temp: 98.1 F (36.7 C)  SpO2: 96%  Weight: 168 lb 3.2 oz (76.3 kg)  Height: 5' 6 (1.676 m)   Body mass index is 27.15 kg/m. Physical Exam Constitutional:      Appearance: Normal appearance.  HENT:     Head: Normocephalic and atraumatic.  Cardiovascular:     Rate and Rhythm: Normal rate and regular rhythm.  Pulmonary:     Effort: Pulmonary effort is normal. No respiratory distress.     Breath sounds: Normal breath sounds. No wheezing.  Abdominal:     General: Bowel sounds are normal. There is no distension.     Tenderness: There is no abdominal tenderness. There is no guarding or rebound.     Comments:    Neurological:     Mental Status: She is alert. Mental status is at baseline.     Motor: No weakness.     Labs reviewed: Recent Labs    03/29/24 0000 04/07/24 0000 07/09/24 0848  NA 142 139 138  K 4.2 3.8 4.1  CL 106 105 107  CO2 29* 25* 19  BUN 18 21 18   CREATININE 0.8 1.0 0.8  CALCIUM  9.5 9.3 10.0   Recent Labs    08/11/23 0000 03/29/24 0000 04/07/24 0000 07/09/24 0848  AST 23 19 21 23   ALT 14 13 15 14   ALKPHOS 56 73 83 72  ALBUMIN 3.4* 4.1  --  4.4   Recent Labs    04/28/24 0000 06/30/24 0000 07/09/24 0848  WBC 5.3 7.5 6.8  NEUTROABS 2,751.00  4,508.00 4,162.00  HGB 9.5* 11.9* 11.8*  HCT 30* 37 36  PLT 180 212 193   Lab Results  Component Value Date   TSH 3.49 08/11/2023   Lab Results  Component Value Date   HGBA1C 5.8 08/11/2023   Lab Results  Component Value Date   CHOL 139 06/11/2022   HDL 56 06/11/2022   LDLCALC 70 06/11/2022   TRIG 57 06/11/2022   CHOLHDL 2.5 06/11/2022    Significant Diagnostic Results in last 30 days:  No results found.  Assessment/Plan  Abnormal UA  Pt denies dysuria, lower abdominal pain, urgency, frequency  Vitals stable Will monitor Instructed nursing staff to repeat UA/ culture if pt experiences any urinary symptoms   Major Neurocognitive disorder Cont with supportive care  GAD Cont with ativan  prn   HTN  Cont with losartan , metoprolol     OA  Cont with tramadol 

## 2024-07-15 ENCOUNTER — Encounter: Payer: Self-pay | Admitting: Sports Medicine

## 2024-07-19 ENCOUNTER — Non-Acute Institutional Stay: Payer: Self-pay | Admitting: Nurse Practitioner

## 2024-07-19 ENCOUNTER — Encounter: Payer: Self-pay | Admitting: Nurse Practitioner

## 2024-07-19 DIAGNOSIS — K219 Gastro-esophageal reflux disease without esophagitis: Secondary | ICD-10-CM

## 2024-07-19 DIAGNOSIS — I4892 Unspecified atrial flutter: Secondary | ICD-10-CM | POA: Diagnosis not present

## 2024-07-19 DIAGNOSIS — F419 Anxiety disorder, unspecified: Secondary | ICD-10-CM

## 2024-07-19 DIAGNOSIS — M150 Primary generalized (osteo)arthritis: Secondary | ICD-10-CM | POA: Diagnosis not present

## 2024-07-19 DIAGNOSIS — I10 Essential (primary) hypertension: Secondary | ICD-10-CM

## 2024-07-19 NOTE — Assessment & Plan Note (Signed)
 Escalated anxiety Lorazepam  prn available to her, but the patient is unable to request or to decide what she needs it MMSE to evaluate memory/cognition We will try Lexapro 5 mg daily

## 2024-07-19 NOTE — Assessment & Plan Note (Signed)
 stable on Omeprazole , Hgb 11.8 07/09/24

## 2024-07-19 NOTE — Progress Notes (Signed)
 Location:   [facility]  Nursing Home Room Number: 820-A Place of Service:  ALF (13) Provider:  Rohin Krejci, NP  PCP: Amaura Authier X, NP  Patient Care Team: Pietrina Jagodzinski X, NP as PCP - General (Internal Medicine) Artis Beggs X, NP as Nurse Practitioner (Internal Medicine)  Extended Emergency Contact Information Primary Emergency Contact: [facility] Address: [address]          [place], [place] [address] [place] Home Phone: [phone] Mobile Phone: [phone] Relation: Son  Code Status:  DNR Goals of care: Advanced Directive information    07/19/2024   10:37 AM  Advanced Directives  Does Patient Have a Medical Advance Directive? Yes  Type of Estate agent of [place];Living will;Out of facility DNR (pink MOST or yellow form)  Does patient want to make changes to medical advance directive? No - Patient declined  Copy of Healthcare Power of Attorney in Chart? Yes - validated most recent copy scanned in chart (See row information)     Chief Complaint  Patient presents with   Anxiety    HPI:  Pt is a 89 y.o. female seen today for an acute visit for reported the patient has been confused, sitting upstairs on the couch since 11:30 PM, refused to come down because there is flooding in she is on the first floor and then needs higher ground.  The patient went to see her room and getting ready for beauty shop and she is in her usual state of health today.  CBC CMP dated 07/09/2024 are unremarkable and the patient has no symptoms of UTI.   The chronic pain in the back of lower neck, CT head/cervical spine 12/18/23 No acute fracture or traumatic subluxation of the cervical spine. 2. Advanced degenerative changes of the cervical spine with moderate spinal canal stenosis at C5-6 and mild spinal canal stenosis at C6-7. 3. High-grade bilateral neural foraminal narrowing from C2-3 through C6-7.                Xray 03/04/24 C+T spine, no compression fx  or subluxation,              Ambulated with walker              Hx of near syncope: not new, no orthostatic hypotension, last episode of dizziness 12/10/23. 12/17/24 CT head: No acute intracranial pathology. 2. Advanced white matter disease, most likely related to chronic microangiopathy, similar to prior CT.              ED eval 06/05/23 for fall, CT head/cervical spine, thoracic spine showed no acute fracture, she was found to have UTI even if the patient stated she is asymptomatic, urine culture: E coli >100,000c/ml, 7 day course of Keflex              ED eval 01/16/23 near syncope, fall, EKG, CT head/cervical spine, X-ray R knee, CBC, CMP unremarkable.             CT head/cervical spine, incidental finding: 6 mm right solid pulmonary nodule within the upper lobe. Per Fleischner Society Guidelines, recommend a non-contrast Chest CT at 6-12 months-ordered. pending CT chest wo CM. CXR 04/02/23 no acute process.              Edema BLE, not apparent, TED             OSA, CPAP, seeing pulmonology.  A-flutter 01/05/22, no recurrence, delay cardiology OP declined Alendronate  after initial consented.  Back pain mid to lower portion, better, X-ray T/L spine showed multilevel degenerative changes, no acute findings CT head/C+T+L spine, and pelvis,  taking Tylenol , Tramadol              Bone spur R foot, callus of foot, f/u podiatrist.              Allergic rhinitis, off Fluticasone nasal spray, Claritin , Atrovent  nasal spray.              Anxiety, Lorazepam  prn available to her.              Elevated TSH 5.07 06/11/22, TSH 5.11 02/10/23, TSH 3.49 08/11/23,  not on supplement.             HTN, taking Losartan , Metoprolol ,  takes ASA, Bun/creat 18/0.8 07/09/24             GERD, stable on Omeprazole , Hgb 11.8 07/09/24             Hyperlipidemia, off Atorvastatin , LDL 70 06/11/22             Prediabetes, diet controlled, Hgb a1c 5.8 08/11/23\   Anemia, on Fe, Hgb 11.8 07/09/24     Past Medical History:   Diagnosis Date   GERD (gastroesophageal reflux disease)    Hx of cataract surgery    both eyes   Hyperglycemia    Hyperlipidemia    Hypertension    Lumbago 04/05/2016   Macular degeneration    ? which eye(s)   Obstructive sleep apnea    Osteoporosis, senile    Overactive bladder    Squamous cell skin cancer 2/16   left lower leg   Past Surgical History:  Procedure Laterality Date   ABDOMINAL HYSTERECTOMY  1960's   APPENDECTOMY  1960's   MOLE REMOVAL  2000   facial   TONSILLECTOMY      Allergies  Allergen Reactions   Adhesive [Tape]    Ampicillin    Elemental Sulfur    Nitrofuran Derivatives    Zoloft  [Sertraline  Hcl] Other (See Comments)    Dizziness, nausea, faint feeling, and nightmares      Allergies as of 07/19/2024       Reactions   Adhesive [tape]    Ampicillin    Elemental Sulfur    Nitrofuran Derivatives    Zoloft  [sertraline  Hcl] Other (See Comments)   Dizziness, nausea, faint feeling, and nightmares          Medication List        Accurate as of July 19, 2024 12:09 PM. If you have any questions, ask your nurse or doctor.          STOP taking these medications    traMADol  50 MG tablet Commonly known as: ULTRAM  Stopped by: Jonnatan Hanners X Jeorgia Helming       TAKE these medications    acetaminophen  500 MG tablet Commonly known as: TYLENOL  Take 500 mg by mouth daily.   acetaminophen  325 MG tablet Commonly known as: TYLENOL  Take 325 mg by mouth every 4 (four) hours as needed. 2 tablets by mouth every 4 hours as need for fever and pain   aspirin  EC 81 MG tablet Take 1 tablet (81 mg total) by mouth daily.   Calcium  Carb-Cholecalciferol 600-20 MG-MCG Tabs Take 1 tablet by mouth 2 (two) times daily. What changed: Another medication with the same name was removed. Continue taking this medication, and follow the  directions you see here. Changed by: Jaimon Bugaj X Yaeli Hartung   ferrous sulfate  325 (65 FE) MG EC tablet Take 325 mg by mouth 3 (three) times a week.  Monday, Wednesday, and Friday. What changed: Another medication with the same name was removed. Continue taking this medication, and follow the directions you see here. Changed by: Maitland Lesiak X Ailis Rigaud   loperamide 2 MG tablet Commonly known as: IMODIUM A-D Take 2 mg by mouth every 6 (six) hours as needed for diarrhea or loose stools.   LORazepam  0.5 MG tablet Commonly known as: ATIVAN  TAKE ONE TABLET ONCE DAILY AS NEEDED FOR ANXIETY.   losartan  100 MG tablet Commonly known as: COZAAR  Take 100 mg by mouth daily.   metoprolol  succinate 25 MG 24 hr tablet Commonly known as: TOPROL -XL TAKE 1/2 TABLET ONCE A DAY.   Ocuvite Adult 50+ Caps Take 1 capsule by mouth every evening.   omeprazole  40 MG capsule Commonly known as: PRILOSEC TAKE 1 CAPSULE DAILY 1/2 HOUR BEFORE BREAKFAST FOR STOMACH ACID.   Systane 0.4-0.3 % Gel ophthalmic gel Generic drug: Polyethyl Glycol-Propyl Glycol Place 1 application into both eyes daily.   TRIAMCINOLONE ACETONIDE(NASAL) NA Place 2 sprays into the nose daily. Each nostril        Review of Systems  Constitutional:  Negative for appetite change, fatigue and fever.  HENT:  Negative for congestion, hearing loss and trouble swallowing.   Eyes:  Negative for visual disturbance.  Respiratory:  Negative for cough and shortness of breath.   Cardiovascular:  Negative for leg swelling.  Gastrointestinal:  Negative for abdominal pain and constipation.  Genitourinary:  Negative for dysuria and urgency.       Worsened from occasionally urinary leakage to frequent urinary incontinence even if more at night.   Musculoskeletal:  Positive for arthralgias, back pain, gait problem and neck pain.  Skin:  Negative for color change.  Neurological:  Negative for speech difficulty, weakness and light-headedness.       Tingling in toes sometimes, better after moves around.  Tingling and numbness in the right shin 04/06/24 resolved after rest and elevated leg.    Psychiatric/Behavioral:  Positive for confusion. Negative for sleep disturbance. The patient is nervous/anxious.     Immunization History  Administered Date(s) Administered   Fluad Quad(high Dose 65+) 10/12/2019, 10/14/2022   Influenza Whole 09/30/2018   Influenza, High Dose Seasonal PF 10/07/2017, 09/28/2021   Influenza-Unspecified 09/27/2015, 10/09/2016, 10/10/2020, 09/30/2023   Moderna Covid-19 Fall Seasonal Vaccine 58yrs & older 10/14/2023   Moderna Covid-19 Vaccine Bivalent Booster 69yrs & up 10/30/2022   Moderna SARS-COV2 Booster Vaccination 10/14/2023   Moderna Sars-Covid-2 Vaccination 01/02/2020, 01/30/2020, 11/06/2020, 05/28/2021, 05/16/2022   PFIZER(Purple Top)SARS-COV-2 Vaccination 09/17/2021   Pfizer Fall 2024 Covid-19 Vaccine 6mos thru 18yrs 05/16/2022   Pneumococcal Conjugate-13 01/23/2015   Pneumococcal Polysaccharide-23 10/20/2007   Respiratory Syncytial Virus Vaccine,Recomb Aduvanted(Arexvy) 12/26/2022   Td 12/14/2012   Tdap 11/05/2023   Pertinent  Health Maintenance Due  Topic Date Due   INFLUENZA VACCINE  07/29/2024   DEXA SCAN  Completed      03/05/2023    2:48 PM 03/12/2023    3:32 PM 04/23/2023    1:49 PM 05/20/2023    2:12 PM 08/14/2023   12:41 PM  Fall Risk  Falls in the past year? 0 0 0 0 0  Was there an injury with Fall? 0 0 0 0 0  Fall Risk Category Calculator 0 0 0 0 0  Patient at Risk for Falls Due  to No Fall Risks Impaired balance/gait;Impaired mobility History of fall(s) History of fall(s)   Fall risk Follow up Falls evaluation completed Falls evaluation completed Falls evaluation completed Falls evaluation completed Falls evaluation completed   Functional Status Survey:    Vitals:   07/19/24 1032  BP: (!) 156/76  Pulse: 74  Resp: 18  Temp: (!) 97.2 F (36.2 C)  SpO2: 95%  Weight: 168 lb 3.2 oz (76.3 kg)  Height: 5' 6 (1.676 m)   Body mass index is 27.15 kg/m. Physical Exam Vitals and nursing note reviewed.  Constitutional:       Appearance: Normal appearance.  HENT:     Head: Normocephalic and atraumatic.     Mouth/Throat:     Mouth: Mucous membranes are moist.  Eyes:     Extraocular Movements: Extraocular movements intact.     Conjunctiva/sclera: Conjunctivae normal.     Pupils: Pupils are equal, round, and reactive to light.  Cardiovascular:     Rate and Rhythm: Normal rate and regular rhythm.     Heart sounds: No murmur heard. Pulmonary:     Effort: Pulmonary effort is normal.     Breath sounds: Normal breath sounds. No rales.  Abdominal:     General: Bowel sounds are normal.     Palpations: Abdomen is soft.     Tenderness: There is no abdominal tenderness.  Musculoskeletal:     Cervical back: Normal range of motion and neck supple.     Right lower leg: No edema.     Left lower leg: No edema.  Skin:    General: Skin is warm and dry.  Neurological:     General: No focal deficit present.     Mental Status: She is alert and oriented to person, place, and time. Mental status is at baseline.     Gait: Gait abnormal.  Psychiatric:        Mood and Affect: Mood normal.        Behavior: Behavior normal.        Thought Content: Thought content normal.     Labs reviewed: Recent Labs    03/29/24 0000 04/07/24 0000 07/09/24 0848  NA 142 139 138  K 4.2 3.8 4.1  CL 106 105 107  CO2 29* 25* 19  BUN 18 21 18   CREATININE 0.8 1.0 0.8  CALCIUM  9.5 9.3 10.0   Recent Labs    08/11/23 0000 03/29/24 0000 04/07/24 0000 07/09/24 0848  AST 23 19 21 23   ALT 14 13 15 14   ALKPHOS 56 73 83 72  ALBUMIN 3.4* 4.1  --  4.4   Recent Labs    04/28/24 0000 06/30/24 0000 07/09/24 0848  WBC 5.3 7.5 6.8  NEUTROABS 2,751.00 4,508.00 4,162.00  HGB 9.5* 11.9* 11.8*  HCT 30* 37 36  PLT 180 212 193   Lab Results  Component Value Date   TSH 3.49 08/11/2023   Lab Results  Component Value Date   HGBA1C 5.8 08/11/2023   Lab Results  Component Value Date   CHOL 139 06/11/2022   HDL 56 06/11/2022   LDLCALC  70 06/11/2022   TRIG 57 06/11/2022   CHOLHDL 2.5 06/11/2022    Significant Diagnostic Results in last 30 days:  No results found.  Assessment/Plan Anxiety Escalated anxiety Lorazepam  prn available to her, but the patient is unable to request or to decide what she needs it MMSE to evaluate memory/cognition We will try Lexapro 5 mg daily   Osteoarthritis, multiple sites Ambulates  with walker, off tramadol  07/18/2024 due to non used  Atrial flutter (HCC) no recurrence, delay cardiology  HTN (hypertension) Close blood pressure controlled, continue metoprolol , losartan   GERD (gastroesophageal reflux disease) stable on Omeprazole , Hgb 11.8 07/09/24    Family/ staff Communication: Plan of care reviewed with the patient charge nurse  Labs/tests ordered:   None

## 2024-07-19 NOTE — Assessment & Plan Note (Signed)
 Ambulates with walker, off tramadol  07/18/2024 due to non used

## 2024-07-19 NOTE — Assessment & Plan Note (Signed)
 Close blood pressure controlled, continue metoprolol , losartan 

## 2024-07-19 NOTE — Assessment & Plan Note (Signed)
 no recurrence, delay cardiology

## 2024-07-21 DIAGNOSIS — M8589 Other specified disorders of bone density and structure, multiple sites: Secondary | ICD-10-CM | POA: Diagnosis not present

## 2024-07-21 DIAGNOSIS — M2550 Pain in unspecified joint: Secondary | ICD-10-CM | POA: Diagnosis not present

## 2024-07-21 DIAGNOSIS — M85851 Other specified disorders of bone density and structure, right thigh: Secondary | ICD-10-CM | POA: Diagnosis not present

## 2024-07-29 DIAGNOSIS — M6281 Muscle weakness (generalized): Secondary | ICD-10-CM | POA: Diagnosis not present

## 2024-07-29 DIAGNOSIS — N3946 Mixed incontinence: Secondary | ICD-10-CM | POA: Diagnosis not present

## 2024-08-02 ENCOUNTER — Non-Acute Institutional Stay: Payer: Self-pay | Admitting: Nurse Practitioner

## 2024-08-02 ENCOUNTER — Encounter: Payer: Self-pay | Admitting: Nurse Practitioner

## 2024-08-02 DIAGNOSIS — F5105 Insomnia due to other mental disorder: Secondary | ICD-10-CM

## 2024-08-02 DIAGNOSIS — I10 Essential (primary) hypertension: Secondary | ICD-10-CM

## 2024-08-02 DIAGNOSIS — F419 Anxiety disorder, unspecified: Secondary | ICD-10-CM

## 2024-08-02 DIAGNOSIS — D509 Iron deficiency anemia, unspecified: Secondary | ICD-10-CM | POA: Diagnosis not present

## 2024-08-02 DIAGNOSIS — M150 Primary generalized (osteo)arthritis: Secondary | ICD-10-CM

## 2024-08-02 NOTE — Progress Notes (Unsigned)
 Location:   [facility] Room Number: 820-A Place of Service:  ALF (13) Provider: Larwance Skyra Crichlow NP  Tynisha Ogan X, NP  Patient Care Team: Uma Jerde X, NP as PCP - General (Internal Medicine) Evamarie Raetz X, NP as Nurse Practitioner (Internal Medicine)  Extended Emergency Contact Information Primary Emergency Contact: [facility] Address: [address]          [place], [place] [address] [place] Home Phone: [phone] Mobile Phone: [phone] Relation: Son  Code Status: DNR Goals of care: Advanced Directive information    08/02/2024    1:40 PM  Advanced Directives  Does Patient Have a Medical Advance Directive? Yes  Type of Estate agent of [place];Out of facility DNR (pink MOST or yellow form);Living will  Does patient want to make changes to medical advance directive? No - Patient declined  Copy of Healthcare Power of Attorney in Chart? Yes - validated most recent copy scanned in chart (See row information)     Chief Complaint  Patient presents with  . Acute Visit    Anxiety     HPI:  Pt is a 89 y.o. female seen today for an acute visit for anxiety/depression, started Cymbalta  07/20/24     The chronic pain in the back of lower neck, CT head/cervical spine 12/18/23 No acute fracture or traumatic subluxation of the cervical spine. 2. Advanced degenerative changes of the cervical spine with moderate spinal canal stenosis at C5-6 and mild spinal canal stenosis at C6-7. 3. High-grade bilateral neural foraminal narrowing from C2-3 through C6-7.                Xray 03/04/24 C+T spine, no compression fx or subluxation,              Ambulated with walker              Hx of near syncope: not new, no orthostatic hypotension, last episode of dizziness 12/10/23. 12/17/24 CT head: No acute intracranial pathology. 2. Advanced white matter disease, most likely related to chronic microangiopathy, similar to prior CT.              ED eval 06/05/23  for fall, CT head/cervical spine, thoracic spine showed no acute fracture, she was found to have UTI even if the patient stated she is asymptomatic, urine culture: E coli >100,000c/ml, 7 day course of Keflex              ED eval 01/16/23 near syncope, fall, EKG, CT head/cervical spine, X-ray R knee, CBC, CMP unremarkable.             CT head/cervical spine, incidental finding: 6 mm right solid pulmonary nodule within the upper lobe. Per Fleischner Society Guidelines, recommend a non-contrast Chest CT at 6-12 months-ordered. pending CT chest wo CM. CXR 04/02/23 no acute process.              Edema BLE, not apparent, TED             OSA, CPAP, seeing pulmonology.              A-flutter 01/05/22, no recurrence, delay cardiology OP declined Alendronate  after initial consented.  Back pain mid to lower portion, better, X-ray T/L spine showed multilevel degenerative changes, no acute findings CT head/C+T+L spine, and pelvis,  taking Tylenol , Tramadol              Bone spur R foot, callus of foot, f/u podiatrist.  Allergic rhinitis, off Fluticasone nasal spray, Claritin , Atrovent  nasal spray.              Anxiety, Lorazepam  prn available to her. questionable positive response to Cymbalta  started 07/20/24             Elevated TSH 5.07 06/11/22, TSH 5.11 02/10/23, TSH 3.49 08/11/23,  not on supplement.             HTN, taking Losartan , Metoprolol ,  takes ASA, Bun/creat 18/0.8 07/09/24             GERD, stable on Omeprazole , Hgb 11.8 07/09/24             Hyperlipidemia, off Atorvastatin , LDL 70 06/11/22             Prediabetes, diet controlled, Hgb a1c 5.8 08/11/23             Anemia, Iron 37 06/30/24, on Fe, Hgb 11.8 07/09/24                  Past Medical History:  Diagnosis Date  . GERD (gastroesophageal reflux disease)   . Hx of cataract surgery    both eyes  . Hyperglycemia   . Hyperlipidemia   . Hypertension   . Lumbago 04/05/2016  . Macular degeneration    ? which eye(s)  . Obstructive sleep  apnea   . Osteoporosis, senile   . Overactive bladder   . Squamous cell skin cancer 2/16   left lower leg   Past Surgical History:  Procedure Laterality Date  . ABDOMINAL HYSTERECTOMY  1960's  . APPENDECTOMY  1960's  . MOLE REMOVAL  2000   facial  . TONSILLECTOMY      Allergies  Allergen Reactions  . Adhesive [Tape]   . Ampicillin   . Elemental Sulfur   . Nitrofuran Derivatives   . Zoloft  [Sertraline  Hcl] Other (See Comments)    Dizziness, nausea, faint feeling, and nightmares      Allergies as of 08/02/2024       Reactions   Adhesive [tape]    Ampicillin    Elemental Sulfur    Nitrofuran Derivatives    Zoloft  [sertraline  Hcl] Other (See Comments)   Dizziness, nausea, faint feeling, and nightmares          Medication List        Accurate as of August 02, 2024 11:59 PM. If you have any questions, ask your nurse or doctor.          acetaminophen  500 MG tablet Commonly known as: TYLENOL  Take 500 mg by mouth daily.   acetaminophen  325 MG tablet Commonly known as: TYLENOL  Take 325 mg by mouth every 4 (four) hours as needed. 2 tablets by mouth every 4 hours as need for fever and pain   aspirin  EC 81 MG tablet Take 1 tablet (81 mg total) by mouth daily.   Calcium  Carb-Cholecalciferol 600-20 MG-MCG Tabs Take 1 tablet by mouth 2 (two) times daily.   ferrous sulfate  325 (65 FE) MG EC tablet Take 325 mg by mouth 3 (three) times a week. Monday, Wednesday, and Friday.   loperamide 2 MG tablet Commonly known as: IMODIUM A-D Take 2 mg by mouth every 6 (six) hours as needed for diarrhea or loose stools.   LORazepam  0.5 MG tablet Commonly known as: ATIVAN  TAKE ONE TABLET ONCE DAILY AS NEEDED FOR ANXIETY.   losartan  100 MG tablet Commonly known as: COZAAR  Take 100 mg by mouth daily.   metoprolol  succinate  25 MG 24 hr tablet Commonly known as: TOPROL -XL TAKE 1/2 TABLET ONCE A DAY.   Ocuvite Adult 50+ Caps Take 1 capsule by mouth every evening.    omeprazole  40 MG capsule Commonly known as: PRILOSEC TAKE 1 CAPSULE DAILY 1/2 HOUR BEFORE BREAKFAST FOR STOMACH ACID.   Systane 0.4-0.3 % Gel ophthalmic gel Generic drug: Polyethyl Glycol-Propyl Glycol Place 1 application into both eyes daily.   TRIAMCINOLONE ACETONIDE(NASAL) NA Place 2 sprays into the nose daily. Each nostril        Review of Systems  Constitutional:  Negative for appetite change, fatigue and fever.  HENT:  Negative for congestion, hearing loss and trouble swallowing.   Eyes:  Negative for visual disturbance.  Respiratory:  Negative for cough and shortness of breath.   Cardiovascular:  Negative for leg swelling.  Gastrointestinal:  Negative for abdominal pain and constipation.  Genitourinary:  Negative for dysuria and urgency.       Worsened from occasionally urinary leakage to frequent urinary incontinence even if more at night.   Musculoskeletal:  Positive for arthralgias, back pain, gait problem and neck pain.  Skin:  Negative for color change.  Neurological:  Negative for speech difficulty, weakness and light-headedness.       Tingling in toes sometimes, better after moves around.  Tingling and numbness in the right shin 04/06/24 resolved after rest and elevated leg.   Psychiatric/Behavioral:  Positive for confusion. Negative for sleep disturbance. The patient is nervous/anxious.     Immunization History  Administered Date(s) Administered  . Fluad Quad(high Dose 65+) 10/12/2019, 10/14/2022  . Influenza Whole 09/30/2018  . Influenza, High Dose Seasonal PF 10/07/2017, 09/28/2021  . Influenza-Unspecified 09/27/2015, 10/09/2016, 10/10/2020, 09/30/2023  . Moderna Covid-19 Fall Seasonal Vaccine 28yrs & older 10/14/2023  . Moderna Covid-19 Vaccine Bivalent Booster 20yrs & up 10/30/2022  . Moderna SARS-COV2 Booster Vaccination 10/14/2023  . Moderna Sars-Covid-2 Vaccination 01/02/2020, 01/30/2020, 11/06/2020, 05/28/2021, 05/16/2022  . PFIZER(Purple  Top)SARS-COV-2 Vaccination 09/17/2021  . Pfizer Fall 2024 Covid-19 Vaccine 6mos thru 43yrs 05/16/2022  . Pneumococcal Conjugate-13 01/23/2015  . Pneumococcal Polysaccharide-23 10/20/2007  . Respiratory Syncytial Virus Vaccine,Recomb Aduvanted(Arexvy) 12/26/2022  . Td 12/14/2012  . Tdap 11/05/2023   Pertinent  Health Maintenance Due  Topic Date Due  . INFLUENZA VACCINE  07/29/2024  . DEXA SCAN  Completed      03/05/2023    2:48 PM 03/12/2023    3:32 PM 04/23/2023    1:49 PM 05/20/2023    2:12 PM 08/14/2023   12:41 PM  Fall Risk  Falls in the past year? 0 0 0 0 0  Was there an injury with Fall? 0 0 0 0 0  Fall Risk Category Calculator 0 0 0 0 0  Patient at Risk for Falls Due to No Fall Risks Impaired balance/gait;Impaired mobility History of fall(s) History of fall(s)   Fall risk Follow up Falls evaluation completed Falls evaluation completed Falls evaluation completed Falls evaluation completed Falls evaluation completed   Functional Status Survey:    Vitals:   08/02/24 1343  BP: 113/60  Pulse: 62  Resp: 18  Temp: 97.7 F (36.5 C)  SpO2: 95%  Weight: 163 lb 3.2 oz (74 kg)  Height: 5' 6 (1.676 m)   Body mass index is 26.34 kg/m. Physical Exam Vitals and nursing note reviewed.  Constitutional:      Appearance: Normal appearance.  HENT:     Head: Normocephalic and atraumatic.     Mouth/Throat:     Mouth: Mucous  membranes are moist.  Eyes:     Extraocular Movements: Extraocular movements intact.     Conjunctiva/sclera: Conjunctivae normal.     Pupils: Pupils are equal, round, and reactive to light.  Cardiovascular:     Rate and Rhythm: Normal rate and regular rhythm.     Heart sounds: No murmur heard. Pulmonary:     Effort: Pulmonary effort is normal.     Breath sounds: Normal breath sounds. No rales.  Abdominal:     General: Bowel sounds are normal.     Palpations: Abdomen is soft.     Tenderness: There is no abdominal tenderness.  Musculoskeletal:     Cervical  back: Normal range of motion and neck supple.     Right lower leg: No edema.     Left lower leg: No edema.  Skin:    General: Skin is warm and dry.  Neurological:     General: No focal deficit present.     Mental Status: She is alert and oriented to person, place, and time. Mental status is at baseline.     Gait: Gait abnormal.  Psychiatric:        Mood and Affect: Mood normal.        Behavior: Behavior normal.        Thought Content: Thought content normal.     Labs reviewed: Recent Labs    03/29/24 0000 04/07/24 0000 07/09/24 0848  NA 142 139 138  K 4.2 3.8 4.1  CL 106 105 107  CO2 29* 25* 19  BUN 18 21 18   CREATININE 0.8 1.0 0.8  CALCIUM  9.5 9.3 10.0   Recent Labs    08/11/23 0000 03/29/24 0000 04/07/24 0000 07/09/24 0848  AST 23 19 21 23   ALT 14 13 15 14   ALKPHOS 56 73 83 72  ALBUMIN 3.4* 4.1  --  4.4   Recent Labs    04/28/24 0000 06/30/24 0000 07/09/24 0848  WBC 5.3 7.5 6.8  NEUTROABS 2,751.00 4,508.00 4,162.00  HGB 9.5* 11.9* 11.8*  HCT 30* 37 36  PLT 180 212 193   Lab Results  Component Value Date   TSH 3.49 08/11/2023   Lab Results  Component Value Date   HGBA1C 5.8 08/11/2023   Lab Results  Component Value Date   CHOL 139 06/11/2022   HDL 56 06/11/2022   LDLCALC 70 06/11/2022   TRIG 57 06/11/2022   CHOLHDL 2.5 06/11/2022    Significant Diagnostic Results in last 30 days:  No results found.  Assessment/Plan: HTN (hypertension) Blood pressure is controlletaking Losartan , Metoprolol ,  takes ASA, Bun/creat 18/0.8 7/12/25d,   GERD (gastroesophageal reflux disease) Stable, on Omeprazole , Hgb 11.8 07/09/24  Iron deficiency anemia on Fe, Hgb 11.8 07/09/24  Insomnia secondary to anxiety  Lorazepam  prn available to her. questionable positive response to Cymbalta  started 07/20/24 Update CBC/diff, CMP/eGFR May hold cymbalta  to re-evaluate its effectiveness.   Osteoarthritis, multiple sites Ambulates with walker slowly, risk of falling  due to increased frailty.     Family/ staff Communication: plan of care reviewed with the patient, the patient's sons, Child psychotherapist, and Press photographer.   Labs/tests ordered:  CBC/diff, CMP/eGFR

## 2024-08-02 NOTE — Assessment & Plan Note (Signed)
 Ambulates with walker slowly, risk of falling due to increased frailty.

## 2024-08-02 NOTE — Assessment & Plan Note (Signed)
 Lorazepam  prn available to her. questionable positive response to Cymbalta  started 07/20/24 Update CBC/diff, CMP/eGFR May hold cymbalta  to re-evaluate its effectiveness.

## 2024-08-02 NOTE — Assessment & Plan Note (Signed)
 Blood pressure is controlletaking Losartan , Metoprolol ,  takes ASA, Bun/creat 18/0.8 7/12/25d,

## 2024-08-02 NOTE — Assessment & Plan Note (Signed)
 Stable, on Omeprazole , Hgb 11.8 07/09/24

## 2024-08-02 NOTE — Assessment & Plan Note (Signed)
 on Fe, Hgb 11.8 07/09/24

## 2024-08-04 ENCOUNTER — Encounter: Payer: Self-pay | Admitting: Nurse Practitioner

## 2024-08-04 DIAGNOSIS — R531 Weakness: Secondary | ICD-10-CM | POA: Diagnosis not present

## 2024-08-04 DIAGNOSIS — I10 Essential (primary) hypertension: Secondary | ICD-10-CM | POA: Diagnosis not present

## 2024-08-04 DIAGNOSIS — N1831 Chronic kidney disease, stage 3a: Secondary | ICD-10-CM | POA: Diagnosis not present

## 2024-08-04 LAB — BASIC METABOLIC PANEL WITH GFR
BUN: 15 (ref 4–21)
CO2: 29 — AB (ref 13–22)
Chloride: 106 (ref 99–108)
Creatinine: 0.8 (ref 0.5–1.1)
Glucose: 102
Potassium: 3.8 meq/L (ref 3.5–5.1)
Sodium: 138 (ref 137–147)

## 2024-08-04 LAB — COMPREHENSIVE METABOLIC PANEL WITH GFR
Albumin: 3.8 (ref 3.5–5.0)
Calcium: 9.4 (ref 8.7–10.7)
Globulin: 1.8

## 2024-08-04 LAB — HEPATIC FUNCTION PANEL
ALT: 12 U/L (ref 7–35)
AST: 18 (ref 13–35)
Alkaline Phosphatase: 66 (ref 25–125)
Bilirubin, Total: 0.5

## 2024-08-04 LAB — CBC: RBC: 3.76 — AB (ref 3.87–5.11)

## 2024-08-04 LAB — CBC AND DIFFERENTIAL
HCT: 36 (ref 36–46)
Hemoglobin: 11.4 — AB (ref 12.0–16.0)
Neutrophils Absolute: 3390
Platelets: 157 K/uL (ref 150–400)
WBC: 6

## 2024-08-04 NOTE — Progress Notes (Signed)
 This encounter was created in error - please disregard.

## 2024-08-09 DIAGNOSIS — M79672 Pain in left foot: Secondary | ICD-10-CM | POA: Diagnosis not present

## 2024-08-09 DIAGNOSIS — M79671 Pain in right foot: Secondary | ICD-10-CM | POA: Diagnosis not present

## 2024-08-09 DIAGNOSIS — B351 Tinea unguium: Secondary | ICD-10-CM | POA: Diagnosis not present

## 2024-08-09 DIAGNOSIS — L89892 Pressure ulcer of other site, stage 2: Secondary | ICD-10-CM | POA: Diagnosis not present

## 2024-08-11 ENCOUNTER — Non-Acute Institutional Stay: Payer: Self-pay | Admitting: Nurse Practitioner

## 2024-08-11 ENCOUNTER — Encounter: Payer: Self-pay | Admitting: Nurse Practitioner

## 2024-08-11 DIAGNOSIS — B3789 Other sites of candidiasis: Secondary | ICD-10-CM | POA: Insufficient documentation

## 2024-08-11 DIAGNOSIS — I10 Essential (primary) hypertension: Secondary | ICD-10-CM | POA: Diagnosis not present

## 2024-08-11 DIAGNOSIS — R410 Disorientation, unspecified: Secondary | ICD-10-CM | POA: Diagnosis not present

## 2024-08-11 DIAGNOSIS — R4189 Other symptoms and signs involving cognitive functions and awareness: Secondary | ICD-10-CM | POA: Insufficient documentation

## 2024-08-11 DIAGNOSIS — F419 Anxiety disorder, unspecified: Secondary | ICD-10-CM | POA: Diagnosis not present

## 2024-08-11 DIAGNOSIS — M150 Primary generalized (osteo)arthritis: Secondary | ICD-10-CM

## 2024-08-11 DIAGNOSIS — K219 Gastro-esophageal reflux disease without esophagitis: Secondary | ICD-10-CM

## 2024-08-11 NOTE — Assessment & Plan Note (Signed)
 Apply nystatin powder to under breast skin fold twice daily for 7 to 10 days

## 2024-08-11 NOTE — Progress Notes (Signed)
 Location:  [facility] Room Number: [id number] A Place of Service:  ALF (13) Provider:  Nicholas Ossa X, NP  Patient Care Team: Laverda Stribling X, NP as PCP - General (Internal Medicine) Joeseph Verville X, NP as Nurse Practitioner (Internal Medicine)  Extended Emergency Contact Information Primary Emergency Contact: [facility] Address: [address]          [place], [place] [address] [place] Home Phone: [phone] Mobile Phone: [phone] Relation: Son  Code Status: DNR Goals of care: Advanced Directive information    08/02/2024    1:40 PM  Advanced Directives  Does Patient Have a Medical Advance Directive? Yes  Type of Estate agent of [place];Out of facility DNR (pink MOST or yellow form);Living will  Does patient want to make changes to medical advance directive? No - Patient declined  Copy of Healthcare Power of Attorney in Chart? Yes - validated most recent copy scanned in chart (See row information)     Chief Complaint  Patient presents with   Altered Mental Status    HPI:  Pt is a 89 y.o. female seen today for an acute visit for confusion when the patient was a fall sitting in the bathtub, recently started BuSpar for anxiety, the patient appears in her usual state of health upon my examination today.  The patient was noted to have redness and the right and left breast upon my examination.     The chronic pain in the back of lower neck, CT head/cervical spine 12/18/23 No acute fracture or traumatic subluxation of the cervical spine. 2. Advanced degenerative changes of the cervical spine with moderate spinal canal stenosis at C5-6 and mild spinal canal stenosis at C6-7. 3. High-grade bilateral neural foraminal narrowing from C2-3 through C6-7.                Xray 03/04/24 C+T spine, no compression fx or subluxation,              Ambulated with walker              Hx of near syncope: not new, no orthostatic hypotension, last  episode of dizziness 12/10/23. 12/17/24 CT head: No acute intracranial pathology. 2. Advanced white matter disease, most likely related to chronic microangiopathy, similar to prior CT.              ED eval 06/05/23 for fall, CT head/cervical spine, thoracic spine showed no acute fracture, she was found to have UTI even if the patient stated she is asymptomatic, urine culture: E coli >100,000c/ml, 7 day course of Keflex              ED eval 01/16/23 near syncope, fall, EKG, CT head/cervical spine, X-ray R knee, CBC, CMP unremarkable.             CT head/cervical spine, incidental finding: 6 mm right solid pulmonary nodule within the upper lobe. Per Fleischner Society Guidelines, recommend a non-contrast Chest CT at 6-12 months-ordered. 12/18/23 CT cervical spine, upper chest negative.  CXR 04/02/23 no acute process.              Edema BLE, not apparent, TED             OSA, CPAP, seeing pulmonology.              A-flutter 01/05/22, no recurrence, delay cardiology OP declined Alendronate  after initial consented.  Back pain mid to lower portion, better, X-ray T/L spine showed multilevel degenerative  changes, no acute findings CT head/C+T+L spine, and pelvis,  taking Tylenol              Bone spur R foot, callus of foot, f/u podiatrist.              Allergic rhinitis, off Fluticasone nasal spray, Claritin , Atrovent  nasal spray.              Anxiety, Lorazepam  prn available to her.  Allergic to as SSRI, failed Cymbalta , BuSpar.             Elevated TSH 5.07 06/11/22, TSH 5.11 02/10/23, TSH 3.49 08/11/23,  not on supplement.             HTN, taking Losartan , Metoprolol ,  takes ASA, Bun/creat 18/0.8 07/09/24             GERD, stable on Omeprazole , Hgb 11.8 07/09/24             Hyperlipidemia, off Atorvastatin , LDL 70 06/11/22             Prediabetes, diet controlled, Hgb a1c 5.8 08/11/23             Anemia, Iron 37 06/30/24, on Fe, Hgb 11.8 07/09/24  Past Medical History:  Diagnosis Date   GERD (gastroesophageal  reflux disease)    Hx of cataract surgery    both eyes   Hyperglycemia    Hyperlipidemia    Hypertension    Lumbago 04/05/2016   Macular degeneration    ? which eye(s)   Obstructive sleep apnea    Osteoporosis, senile    Overactive bladder    Squamous cell skin cancer 2/16   left lower leg   Past Surgical History:  Procedure Laterality Date   ABDOMINAL HYSTERECTOMY  1960's   APPENDECTOMY  1960's   MOLE REMOVAL  2000   facial   TONSILLECTOMY      Allergies  Allergen Reactions   Adhesive [Tape]    Ampicillin    Elemental Sulfur    Nitrofuran Derivatives    Zoloft  [Sertraline  Hcl] Other (See Comments)    Dizziness, nausea, faint feeling, and nightmares      Outpatient Encounter Medications as of 08/11/2024  Medication Sig   acetaminophen  (TYLENOL ) 500 MG tablet Take 500 mg by mouth daily.    aspirin  EC 81 MG tablet Take 1 tablet (81 mg total) by mouth daily.   busPIRone (BUSPAR) 5 MG tablet Take 2.5 mg by mouth daily.   Calcium  Carb-Cholecalciferol 600-20 MG-MCG TABS Take 1 tablet by mouth 2 (two) times daily.   ferrous sulfate  325 (65 FE) MG EC tablet Take 325 mg by mouth 3 (three) times a week. Monday, Wednesday, and Friday.   losartan  (COZAAR ) 100 MG tablet Take 100 mg by mouth daily.   metoprolol  succinate (TOPROL -XL) 25 MG 24 hr tablet TAKE 1/2 TABLET ONCE A DAY.   Multiple Vitamins-Minerals (OCUVITE ADULT 50+) CAPS Take 1 capsule by mouth every evening.   nystatin powder Apply 1 Application topically 2 (two) times daily. Apply to breast x 10 days beginning on 08/11/24   omeprazole  (PRILOSEC) 40 MG capsule TAKE 1 CAPSULE DAILY 1/2 HOUR BEFORE BREAKFAST FOR STOMACH ACID.   Polyethyl Glycol-Propyl Glycol (SYSTANE) 0.4-0.3 % GEL ophthalmic gel Place 1 application into both eyes daily.   TRIAMCINOLONE ACETONIDE,NASAL, NA Place 2 sprays into the nose daily. Each nostril   acetaminophen  (TYLENOL ) 325 MG tablet Take 325 mg by mouth every 4 (four) hours as needed. 2 tablets by  mouth  every 4 hours as need for fever and pain   loperamide (IMODIUM A-D) 2 MG tablet Take 2 mg by mouth every 6 (six) hours as needed for diarrhea or loose stools.   LORazepam  (ATIVAN ) 0.5 MG tablet TAKE ONE TABLET ONCE DAILY AS NEEDED FOR ANXIETY.   No facility-administered encounter medications on file as of 08/11/2024.    Review of Systems  Constitutional:  Negative for appetite change, fatigue and fever.  HENT:  Negative for congestion, hearing loss and trouble swallowing.   Eyes:  Negative for visual disturbance.  Respiratory:  Negative for cough and shortness of breath.   Cardiovascular:  Negative for leg swelling.  Gastrointestinal:  Negative for abdominal pain and constipation.  Genitourinary:  Negative for dysuria and urgency.       Worsened from occasionally urinary leakage to frequent urinary incontinence even if more at night.   Musculoskeletal:  Positive for arthralgias, back pain, gait problem and neck pain.  Skin:  Negative for color change.  Neurological:  Negative for speech difficulty, weakness and light-headedness.       Tingling in toes sometimes, better after moves around.  Tingling and numbness in the right shin 04/06/24 resolved after rest and elevated leg.   Psychiatric/Behavioral:  Positive for confusion. Negative for sleep disturbance. The patient is nervous/anxious.     Immunization History  Administered Date(s) Administered   Fluad Quad(high Dose 65+) 10/12/2019, 10/14/2022   Influenza Whole 09/30/2018   Influenza, High Dose Seasonal PF 10/07/2017, 09/28/2021   Influenza-Unspecified 09/27/2015, 10/09/2016, 10/10/2020, 09/30/2023   Moderna Covid-19 Fall Seasonal Vaccine 27yrs & older 10/14/2023   Moderna Covid-19 Vaccine Bivalent Booster 90yrs & up 10/30/2022   Moderna SARS-COV2 Booster Vaccination 10/14/2023   Moderna Sars-Covid-2 Vaccination 01/02/2020, 01/30/2020, 11/06/2020, 05/28/2021, 05/16/2022   PFIZER(Purple Top)SARS-COV-2 Vaccination 09/17/2021    Pfizer Fall 2024 Covid-19 Vaccine 6mos thru 23yrs 05/16/2022   Pneumococcal Conjugate-13 01/23/2015   Pneumococcal Polysaccharide-23 10/20/2007   Respiratory Syncytial Virus Vaccine,Recomb Aduvanted(Arexvy) 12/26/2022   Td 12/14/2012   Tdap 11/05/2023   Pertinent  Health Maintenance Due  Topic Date Due   INFLUENZA VACCINE  09/28/2024 (Originally 07/29/2024)   DEXA SCAN  Completed      03/05/2023    2:48 PM 03/12/2023    3:32 PM 04/23/2023    1:49 PM 05/20/2023    2:12 PM 08/14/2023   12:41 PM  Fall Risk  Falls in the past year? 0 0 0 0 0  Was there an injury with Fall? 0 0 0 0 0  Fall Risk Category Calculator 0 0 0 0 0  Patient at Risk for Falls Due to No Fall Risks Impaired balance/gait;Impaired mobility History of fall(s) History of fall(s)   Fall risk Follow up Falls evaluation completed Falls evaluation completed Falls evaluation completed Falls evaluation completed Falls evaluation completed   Functional Status Survey:    Vitals:   08/11/24 1010  BP: 132/74  Pulse: 78  Weight: 163 lb 3.2 oz (74 kg)  Height: 5' 6 (1.676 m)   Body mass index is 26.34 kg/m. Physical Exam Vitals and nursing note reviewed.  Constitutional:      Appearance: Normal appearance.  HENT:     Head: Normocephalic and atraumatic.     Mouth/Throat:     Mouth: Mucous membranes are moist.  Eyes:     Extraocular Movements: Extraocular movements intact.     Conjunctiva/sclera: Conjunctivae normal.     Pupils: Pupils are equal, round, and reactive to light.  Cardiovascular:  Rate and Rhythm: Normal rate and regular rhythm.     Heart sounds: No murmur heard. Pulmonary:     Effort: Pulmonary effort is normal.     Breath sounds: Normal breath sounds. No rales.  Abdominal:     General: Bowel sounds are normal.     Palpations: Abdomen is soft.     Tenderness: There is no abdominal tenderness.  Musculoskeletal:     Cervical back: Normal range of motion and neck supple.     Right lower leg: No  edema.     Left lower leg: No edema.  Skin:    General: Skin is warm and dry.     Findings: Rash present.     Comments: Redness noted under the right and the left breast skin fold  Neurological:     General: No focal deficit present.     Mental Status: She is alert and oriented to person, place, and time. Mental status is at baseline.     Gait: Gait abnormal.  Psychiatric:        Mood and Affect: Mood normal.        Behavior: Behavior normal.        Thought Content: Thought content normal.     Labs reviewed: Recent Labs    03/29/24 0000 04/07/24 0000 07/09/24 0848  NA 142 139 138  K 4.2 3.8 4.1  CL 106 105 107  CO2 29* 25* 19  BUN 18 21 18   CREATININE 0.8 1.0 0.8  CALCIUM  9.5 9.3 10.0   Recent Labs    03/29/24 0000 04/07/24 0000 07/09/24 0848  AST 19 21 23   ALT 13 15 14   ALKPHOS 73 83 72  ALBUMIN 4.1  --  4.4   Recent Labs    04/28/24 0000 06/30/24 0000 07/09/24 0848  WBC 5.3 7.5 6.8  NEUTROABS 2,751.00 4,508.00 4,162.00  HGB 9.5* 11.9* 11.8*  HCT 30* 37 36  PLT 180 212 193   Lab Results  Component Value Date   TSH 3.49 08/11/2023   Lab Results  Component Value Date   HGBA1C 5.8 08/11/2023   Lab Results  Component Value Date   CHOL 139 06/11/2022   HDL 56 06/11/2022   LDLCALC 70 06/11/2022   TRIG 57 06/11/2022   CHOLHDL 2.5 06/11/2022    Significant Diagnostic Results in last 30 days:  No results found.  Assessment/Plan Anxiety Lorazepam  prn available to her.  Allergic to as SSRI, failed Cymbalta , BuSpar.  Candidiasis of breast Apply nystatin powder to under breast skin fold twice daily for 7 to 10 days  HTN (hypertension) Blood pressure is controlled, taking Losartan , Metoprolol ,  takes ASA, Bun/creat 18/0.8 07/09/24  GERD (gastroesophageal reflux disease)  stable on Omeprazole , Hgb 11.8 07/09/24  Osteoarthritis, multiple sites Back pain mid to lower portion, better, X-ray T/L spine showed multilevel degenerative changes, no acute  findings CT head/C+T+L spine, and pelvis,  taking Tylenol   Confusion The patient is in her usual state of health upon my examination today Will monitor her vital signs/neurocheck every 4 hours for 72 hours Stop BuSpar, observe    Family/ staff Communication: Plan of care reviewed with the patient, the patient's son/health power of attorney, and charge nurse  Labs/tests ordered: None

## 2024-08-11 NOTE — Assessment & Plan Note (Signed)
 Blood pressure is controlled, taking Losartan , Metoprolol ,  takes ASA, Bun/creat 18/0.8 07/09/24

## 2024-08-11 NOTE — Assessment & Plan Note (Signed)
 stable on Omeprazole , Hgb 11.8 07/09/24

## 2024-08-11 NOTE — Assessment & Plan Note (Signed)
 Back pain mid to lower portion, better, X-ray T/L spine showed multilevel degenerative changes, no acute findings CT head/C+T+L spine, and pelvis,  taking Tylenol 

## 2024-08-11 NOTE — Assessment & Plan Note (Signed)
 Lorazepam  prn available to her.  Allergic to as SSRI, failed Cymbalta , BuSpar.

## 2024-08-11 NOTE — Assessment & Plan Note (Signed)
 The patient is in her usual state of health upon my examination today Will monitor her vital signs/neurocheck every 4 hours for 72 hours Stop BuSpar, observe

## 2024-08-16 ENCOUNTER — Emergency Department (HOSPITAL_COMMUNITY)

## 2024-08-16 ENCOUNTER — Emergency Department (HOSPITAL_COMMUNITY)
Admission: EM | Admit: 2024-08-16 | Discharge: 2024-08-16 | Disposition: A | Discharge status: Home / Self Care | Attending: Emergency Medicine | Admitting: Emergency Medicine

## 2024-08-16 ENCOUNTER — Encounter (HOSPITAL_COMMUNITY): Payer: Self-pay

## 2024-08-16 ENCOUNTER — Other Ambulatory Visit: Payer: Self-pay

## 2024-08-16 DIAGNOSIS — M5134 Other intervertebral disc degeneration, thoracic region: Secondary | ICD-10-CM | POA: Diagnosis not present

## 2024-08-16 DIAGNOSIS — M4802 Spinal stenosis, cervical region: Secondary | ICD-10-CM | POA: Diagnosis not present

## 2024-08-16 DIAGNOSIS — I10 Essential (primary) hypertension: Secondary | ICD-10-CM | POA: Insufficient documentation

## 2024-08-16 DIAGNOSIS — M47814 Spondylosis without myelopathy or radiculopathy, thoracic region: Secondary | ICD-10-CM | POA: Diagnosis not present

## 2024-08-16 DIAGNOSIS — S3992XA Unspecified injury of lower back, initial encounter: Secondary | ICD-10-CM | POA: Diagnosis not present

## 2024-08-16 DIAGNOSIS — W19XXXA Unspecified fall, initial encounter: Secondary | ICD-10-CM

## 2024-08-16 DIAGNOSIS — Z85828 Personal history of other malignant neoplasm of skin: Secondary | ICD-10-CM | POA: Diagnosis not present

## 2024-08-16 DIAGNOSIS — Z79899 Other long term (current) drug therapy: Secondary | ICD-10-CM | POA: Insufficient documentation

## 2024-08-16 DIAGNOSIS — M25562 Pain in left knee: Secondary | ICD-10-CM | POA: Insufficient documentation

## 2024-08-16 DIAGNOSIS — S0990XA Unspecified injury of head, initial encounter: Secondary | ICD-10-CM | POA: Diagnosis not present

## 2024-08-16 DIAGNOSIS — W01198A Fall on same level from slipping, tripping and stumbling with subsequent striking against other object, initial encounter: Secondary | ICD-10-CM | POA: Diagnosis not present

## 2024-08-16 DIAGNOSIS — S79912A Unspecified injury of left hip, initial encounter: Secondary | ICD-10-CM | POA: Diagnosis not present

## 2024-08-16 DIAGNOSIS — M85862 Other specified disorders of bone density and structure, left lower leg: Secondary | ICD-10-CM | POA: Diagnosis not present

## 2024-08-16 DIAGNOSIS — M25561 Pain in right knee: Secondary | ICD-10-CM | POA: Diagnosis not present

## 2024-08-16 DIAGNOSIS — Z7982 Long term (current) use of aspirin: Secondary | ICD-10-CM | POA: Diagnosis not present

## 2024-08-16 DIAGNOSIS — M546 Pain in thoracic spine: Secondary | ICD-10-CM | POA: Insufficient documentation

## 2024-08-16 DIAGNOSIS — M25551 Pain in right hip: Secondary | ICD-10-CM | POA: Diagnosis not present

## 2024-08-16 DIAGNOSIS — M4804 Spinal stenosis, thoracic region: Secondary | ICD-10-CM | POA: Diagnosis not present

## 2024-08-16 DIAGNOSIS — M4312 Spondylolisthesis, cervical region: Secondary | ICD-10-CM | POA: Diagnosis not present

## 2024-08-16 DIAGNOSIS — R9082 White matter disease, unspecified: Secondary | ICD-10-CM | POA: Diagnosis not present

## 2024-08-16 DIAGNOSIS — M549 Dorsalgia, unspecified: Secondary | ICD-10-CM | POA: Diagnosis not present

## 2024-08-16 DIAGNOSIS — S8992XA Unspecified injury of left lower leg, initial encounter: Secondary | ICD-10-CM | POA: Diagnosis not present

## 2024-08-16 DIAGNOSIS — M25572 Pain in left ankle and joints of left foot: Secondary | ICD-10-CM | POA: Diagnosis not present

## 2024-08-16 DIAGNOSIS — Z043 Encounter for examination and observation following other accident: Secondary | ICD-10-CM | POA: Diagnosis not present

## 2024-08-16 DIAGNOSIS — M47812 Spondylosis without myelopathy or radiculopathy, cervical region: Secondary | ICD-10-CM | POA: Diagnosis not present

## 2024-08-16 MED ORDER — ACETAMINOPHEN 500 MG PO TABS
1000.0000 mg | ORAL_TABLET | Freq: Once | ORAL | Status: AC
  Administered 2024-08-16: 1000 mg via ORAL
  Filled 2024-08-16: qty 2

## 2024-08-16 NOTE — ED Triage Notes (Signed)
 Pt to ED via [facility] from [facility]. Pt had an unwitnessed fall, c/o right knee and hip pain and back pain. Pt does not take blood thinners. Pt states she did not hit her head and did not have LOC. EMS VSS.

## 2024-08-16 NOTE — ED Notes (Signed)
 Pt ambulated w/difficulty. Pt c/o neck and upper/mid back pain.

## 2024-08-16 NOTE — ED Provider Notes (Signed)
 [place] [facility]ERGENCY DEPARTMENT AT [facility] Provider Note   CSN: [id number] Arrival date & time: 08/16/24  9742     Patient presents with: Jean [name] is a 89 y.o. female.   HPI    This a 89 year old female who presents after a fall.  She comes from friend's home of [place].  Unwitnessed fall.  Initially complained of hip and back pain.  Triage note says right although patient says left.  She states that she rolled out of bed although this was not witnessed.  When I ask her if she hit her head she stated not hard.  She is awake and alert.  Denies chest pain or shortness of breath.  Prior to Admission medications   Medication Sig Start Date End Date Taking? Authorizing Provider  acetaminophen  (TYLENOL ) 325 MG tablet Take 325 mg by mouth every 4 (four) hours as needed. 2 tablets by mouth every 4 hours as need for fever and pain    [provider]  acetaminophen  (TYLENOL ) 500 MG tablet Take 500 mg by mouth daily.     [provider]  aspirin  EC 81 MG tablet Take 1 tablet (81 mg total) by mouth daily. 07/07/17   Arnetha Heft, MD  busPIRone (BUSPAR) 5 MG tablet Take 2.5 mg by mouth daily.    [provider]  Calcium  Carb-Cholecalciferol 600-20 MG-MCG TABS Take 1 tablet by mouth 2 (two) times daily.    [provider]  ferrous sulfate  325 (65 FE) MG EC tablet Take 325 mg by mouth 3 (three) times a week. Monday, Wednesday, and Friday.    [provider]  loperamide (IMODIUM A-D) 2 MG tablet Take 2 mg by mouth every 6 (six) hours as needed for diarrhea or loose stools.    [provider]  LORazepam  (ATIVAN ) 0.5 MG tablet TAKE ONE TABLET ONCE DAILY AS NEEDED FOR ANXIETY. 01/06/23   Mast, Man X, NP  losartan  (COZAAR ) 100 MG tablet Take 100 mg by mouth daily.    [provider]  metoprolol  succinate (TOPROL -XL) 25 MG 24 hr tablet TAKE 1/2 TABLET ONCE A DAY. 08/13/22   Mast, Man X, NP  Multiple  Vitamins-Minerals (OCUVITE ADULT 50+) CAPS Take 1 capsule by mouth every evening.    [provider]  nystatin powder Apply 1 Application topically 2 (two) times daily. Apply to breast x 10 days beginning on 08/11/24    [provider]  omeprazole  (PRILOSEC) 40 MG capsule TAKE 1 CAPSULE DAILY 1/2 HOUR BEFORE BREAKFAST FOR STOMACH ACID. 10/03/22   Mast, Man X, NP  Polyethyl Glycol-Propyl Glycol (SYSTANE) 0.4-0.3 % GEL ophthalmic gel Place 1 application into both eyes daily.    [provider]  TRIAMCINOLONE ACETONIDE,NASAL, NA Place 2 sprays into the nose daily. Each nostril    [provider]    Allergies: Adhesive [tape], Ampicillin, Elemental sulfur, Nitrofuran derivatives, and Zoloft  [sertraline  hcl]    Review of Systems  Constitutional:  Negative for fever.  Respiratory:  Negative for shortness of breath.   Cardiovascular:  Negative for chest pain.  Musculoskeletal:        Hip and knee pain  All other systems reviewed and are negative.   Updated Vital Signs BP (!) 159/80   Pulse 77   Temp 97.6 F (36.4 C) (Axillary)   Resp 15   Ht 1.702 m (5' 7)   Wt 74 kg   SpO2 95%   BMI 25.55 kg/m   Physical  Exam Vitals and nursing note reviewed.  Constitutional:      Appearance: She is well-developed. She is not ill-appearing.  HENT:     Head: Normocephalic and atraumatic.  Eyes:     Pupils: Pupils are equal, round, and reactive to light.  Cardiovascular:     Rate and Rhythm: Normal rate and regular rhythm.     Heart sounds: Normal heart sounds.  Pulmonary:     Effort: Pulmonary effort is normal. No respiratory distress.     Breath sounds: No wheezing.  Abdominal:     Palpations: Abdomen is soft.  Musculoskeletal:     Cervical back: Neck supple.     Comments: Normal range of motion of the right hip and knee, pain with range of motion of the left knee, no obvious deformity Tenderness to palpation mid thoracic spine, no step-off or deformities  noted  Skin:    General: Skin is warm and dry.  Neurological:     Mental Status: She is alert and oriented to person, place, and time.  Psychiatric:        Mood and Affect: Mood normal.     (all labs ordered are listed, but only abnormal results are displayed) Labs Reviewed - No data to display  EKG: None  Radiology: DG Thoracic Spine 2 View Result Date: 08/16/2024 EXAM: 2 VIEW(S) XRAY OF THE THORACIC SPINE 08/16/2024 06:12:00 AM COMPARISON: CT of the thoracic spine dated 06/05/2023. CLINICAL HISTORY: Fall. FINDINGS: BONES: No acute fracture. No aggressive appearing osseous lesion. Alignment is normal. DISCS AND DEGENERATIVE CHANGES: Moderate chronic degenerative disc disease at T11-12 with disc space narrowing and sclerosis of the endplates anteriorly. SOFT TISSUES: The visualized lungs are clear. IMPRESSION: 1. No acute fracture. 2. Moderate chronic degenerative disc disease at T11-T12 with disc space narrowing and sclerosis of the endplates anteriorly. Electronically signed by: Evalene Coho MD 08/16/2024 06:21 AM EDT RP Workstation: [id number]   CT Cervical Spine Wo Contrast Result Date: 08/16/2024 EXAM: CT CERVICAL SPINE WITHOUT CONTRAST 08/16/2024 05:48:24 AM TECHNIQUE: CT of the cervical spine was performed without the administration of intravenous contrast. Multiplanar reformatted images are provided for review. Automated exposure control, iterative reconstruction, and/or weight based adjustment of the mA/kV was utilized to reduce the radiation dose to as low as reasonably achievable. COMPARISON: CT of the cervical spine dated 12/18/2023. CLINICAL HISTORY: Neck trauma (Age >= 65y). Pt to ED via [facility] from [facility] nursing facility. Pt had an unwitnessed fall, c/o right knee and hip pain and back pain. Pt does not take blood thinners. Pt states she did not hit her head and did not have LOC. FINDINGS: CERVICAL SPINE: BONES AND ALIGNMENT: No acute fracture or traumatic  malalignment. DEGENERATIVE CHANGES: There is slight degenerative anterolisthesis at C3-4 with moderate bilateral facet arthrosis. There is also slight degenerative anterolisthesis at C4-5. At C5-6, there is disc space narrowing and diffuse posterior endplate ridging with moderate central spinal canal stenosis and moderate-to-severe bilateral neural foraminal stenosis. At C6-7 there is moderate central spinal canal stenosis and moderate bilateral neural foraminal stenosis. There is moderate diffuse facet arthrosis throughout the cervical spine. SOFT TISSUES: No prevertebral soft tissue swelling. IMPRESSION: 1. No acute abnormality of the cervical spine related to the reported neck trauma. 2. Slight degenerative anterolisthesis at C3-4 and C4-5 with moderate bilateral facet arthrosis at C3-4. 3. Disc space narrowing and diffuse posterior endplate ridging at C5-6 with moderate central spinal canal stenosis and moderate-to-severe bilateral neural foraminal stenosis. 4. Moderate central spinal  canal stenosis and moderate bilateral neural foraminal stenosis at C6-7. 5. Moderate diffuse facet arthrosis throughout the cervical spine. Electronically signed by: Evalene Coho MD 08/16/2024 05:57 AM EDT RP Workstation: [id number]   DG Hip Unilat W or Wo Pelvis 2-3 Views Left Result Date: 08/16/2024 CLINICAL DATA:  Fall injury with blunt polytrauma. EXAM: DG HIP (WITH OR WITHOUT PELVIS) 2-3V LEFT; LUMBAR SPINE - COMPLETE 4+ VIEW; LEFT KNEE - COMPLETE 4+ VIEW COMPARISON:  Lumbar spine CT 06/03/2021, CT pelvis 06/03/2021. No prior left knee series. FINDINGS: Lumbar spine, four views with AP Lat, both obliques: There is mild-to-moderate dextrorotary lumbar scoliosis, apex L2. Lateral view again demonstrating minimal grade 1 degenerative retrolisthesis at T12-L1, L1-2 and L2-3. Osteopenia. There is mild chronic wedge compression deformity of T12. The lumbar vertebrae are normal in heights without evidence of acute fracture.  There is multilevel bridging marginal osteophytosis to the left. The discs are degenerated and collapsed with vacuum phenomenon at L3-4 and L5-S1, disc space loss also at L1-2 and L2-3. There is facet hypertrophy from L1-2 down, greatest at the lowest 3 levels where it is greater on the right Both SI joints are patent. There is patchy calcific plaque in the abdominal aorta. Three views AP pelvis and AP and frog-leg left hip: There is no evidence of pelvic fracture or diastasis. No evidence of left hip fracture or dislocation. Joint spaces are maintained. Osteopenia. There is no other focal bone abnormality. Pelvic phleboliths. Soft tissues are otherwise unremarkable. Left knee, four views: There is osteopenia. No evidence of fractures, dislocation, arthropathy or other focal bone abnormality. No joint effusion is seen. There R calcifications in the distal quadriceps tendon. Scattered phleboliths in the upper foreleg. Soft tissues are otherwise unremarkable. IMPRESSION: 1. Osteopenia and degenerative change without evidence of acute fractures of the lumbar spine, pelvis, left hip or left knee. 2. Chronic mild wedge compression deformity of T12. 3. Aortic atherosclerosis. 4. Calcifications in the distal quadriceps tendon. Electronically Signed   By: Francis Quam M.D.   On: 08/16/2024 04:38   DG Knee Complete 4 Views Left Result Date: 08/16/2024 CLINICAL DATA:  Fall injury with blunt polytrauma. EXAM: DG HIP (WITH OR WITHOUT PELVIS) 2-3V LEFT; LUMBAR SPINE - COMPLETE 4+ VIEW; LEFT KNEE - COMPLETE 4+ VIEW COMPARISON:  Lumbar spine CT 06/03/2021, CT pelvis 06/03/2021. No prior left knee series. FINDINGS: Lumbar spine, four views with AP Lat, both obliques: There is mild-to-moderate dextrorotary lumbar scoliosis, apex L2. Lateral view again demonstrating minimal grade 1 degenerative retrolisthesis at T12-L1, L1-2 and L2-3. Osteopenia. There is mild chronic wedge compression deformity of T12. The lumbar vertebrae are  normal in heights without evidence of acute fracture. There is multilevel bridging marginal osteophytosis to the left. The discs are degenerated and collapsed with vacuum phenomenon at L3-4 and L5-S1, disc space loss also at L1-2 and L2-3. There is facet hypertrophy from L1-2 down, greatest at the lowest 3 levels where it is greater on the right Both SI joints are patent. There is patchy calcific plaque in the abdominal aorta. Three views AP pelvis and AP and frog-leg left hip: There is no evidence of pelvic fracture or diastasis. No evidence of left hip fracture or dislocation. Joint spaces are maintained. Osteopenia. There is no other focal bone abnormality. Pelvic phleboliths. Soft tissues are otherwise unremarkable. Left knee, four views: There is osteopenia. No evidence of fractures, dislocation, arthropathy or other focal bone abnormality. No joint effusion is seen. There R calcifications in the distal quadriceps tendon.  Scattered phleboliths in the upper foreleg. Soft tissues are otherwise unremarkable. IMPRESSION: 1. Osteopenia and degenerative change without evidence of acute fractures of the lumbar spine, pelvis, left hip or left knee. 2. Chronic mild wedge compression deformity of T12. 3. Aortic atherosclerosis. 4. Calcifications in the distal quadriceps tendon. Electronically Signed   By: Francis Quam M.D.   On: 08/16/2024 04:38   DG Lumbar Spine Complete Result Date: 08/16/2024 CLINICAL DATA:  Fall injury with blunt polytrauma. EXAM: DG HIP (WITH OR WITHOUT PELVIS) 2-3V LEFT; LUMBAR SPINE - COMPLETE 4+ VIEW; LEFT KNEE - COMPLETE 4+ VIEW COMPARISON:  Lumbar spine CT 06/03/2021, CT pelvis 06/03/2021. No prior left knee series. FINDINGS: Lumbar spine, four views with AP Lat, both obliques: There is mild-to-moderate dextrorotary lumbar scoliosis, apex L2. Lateral view again demonstrating minimal grade 1 degenerative retrolisthesis at T12-L1, L1-2 and L2-3. Osteopenia. There is mild chronic wedge  compression deformity of T12. The lumbar vertebrae are normal in heights without evidence of acute fracture. There is multilevel bridging marginal osteophytosis to the left. The discs are degenerated and collapsed with vacuum phenomenon at L3-4 and L5-S1, disc space loss also at L1-2 and L2-3. There is facet hypertrophy from L1-2 down, greatest at the lowest 3 levels where it is greater on the right Both SI joints are patent. There is patchy calcific plaque in the abdominal aorta. Three views AP pelvis and AP and frog-leg left hip: There is no evidence of pelvic fracture or diastasis. No evidence of left hip fracture or dislocation. Joint spaces are maintained. Osteopenia. There is no other focal bone abnormality. Pelvic phleboliths. Soft tissues are otherwise unremarkable. Left knee, four views: There is osteopenia. No evidence of fractures, dislocation, arthropathy or other focal bone abnormality. No joint effusion is seen. There R calcifications in the distal quadriceps tendon. Scattered phleboliths in the upper foreleg. Soft tissues are otherwise unremarkable. IMPRESSION: 1. Osteopenia and degenerative change without evidence of acute fractures of the lumbar spine, pelvis, left hip or left knee. 2. Chronic mild wedge compression deformity of T12. 3. Aortic atherosclerosis. 4. Calcifications in the distal quadriceps tendon. Electronically Signed   By: Francis Quam M.D.   On: 08/16/2024 04:38   CT Head Wo Contrast Result Date: 08/16/2024 EXAM: CT HEAD WITHOUT CONTRAST 08/16/2024 03:58:38 AM TECHNIQUE: CT of the head was performed without the administration of intravenous contrast. Automated exposure control, iterative reconstruction, and/or weight based adjustment of the mA/kV was utilized to reduce the radiation dose to as low as reasonably achievable. COMPARISON: 12/18/2023. CLINICAL HISTORY: Head trauma, minor (Age >= 65y). Fall FINDINGS: BRAIN AND VENTRICLES: No acute hemorrhage. Gray-white  differentiation is preserved. No hydrocephalus. No extra-axial collection. No mass effect or midline shift. Age-related volume loss and extensive cerebral white matter disease. ORBITS: The patient is status post bilateral lens replacement. SINUSES: No acute abnormality. SOFT TISSUES AND SKULL: No acute soft tissue abnormality. No skull fracture. IMPRESSION: 1. No acute intracranial abnormality. 2. Age-related volume loss and extensive cerebral white matter disease. Electronically signed by: Evalene Coho MD 08/16/2024 04:09 AM EDT RP Workstation: [id number]     Procedures   Medications Ordered in the ED  acetaminophen  (TYLENOL ) tablet 1,000 mg (1,000 mg Oral Given 08/16/24 0559)    Clinical Course as of 08/16/24 0650  Tue Aug 16, 2024  0523 Attempted to ambulate patient.  Per nursing, patient complaining of back pain and neck pain. [CH]  [phone] Further imaging does not show any acute injuries.  Patient ambulated better after Tylenol .  Will discharge back to facility. [CH]    Clinical Course User Index [CH] Jameyah Fennewald, Charmaine FALCON, MD                                 Medical Decision Making Amount and/or Complexity of Data Reviewed Radiology: ordered.  Risk OTC drugs.   This patient presents to the ED for concern of fall, this involves an extensive number of treatment options, and is a complaint that carries with it a high risk of complications and morbidity.  I considered the following differential and admission for this acute, potentially life threatening condition.  The differential diagnosis includes fracture, contusion, dislocation, head injury  MDM:    This is a 89 year old female who presents after a fall.  She tells me that she rolled out of bed.  She is alert and oriented.  Nontoxic.  ABCs are intact.  Reports back pain and left knee and hip pain.  X-rays initially negative.  CT head negative as well.  Attempted ambulation and patient complained more of mid to low thoracic back pain  and neck pain.  Further x-rays and CT imaging were obtained.  These are negative for acute process.  Patient was given a gram of Tylenol  and ambulated better at her baseline per nursing.  Will not pursue advanced imaging at this time. (Labs, imaging, consults)  Labs: I Ordered, and personally interpreted labs.  The pertinent results include: None  Imaging Studies ordered: I ordered imaging studies including CT and x-rays I independently visualized and interpreted imaging. I agree with the radiologist interpretation  Additional history obtained from EMS.  External records from outside source obtained and reviewed including prior evaluations  Cardiac Monitoring: The patient was maintained on a cardiac monitor.  If on the cardiac monitor, I personally viewed and interpreted the cardiac monitored which showed an underlying rhythm of: Sinus  Reevaluation: After the interventions noted above, I reevaluated the patient and found that they have :improved  Social Determinants of Health:  lives at living facility  Disposition: Discharge  Co morbidities that complicate the patient evaluation  Past Medical History:  Diagnosis Date   GERD (gastroesophageal reflux disease)    Hx of cataract surgery    both eyes   Hyperglycemia    Hyperlipidemia    Hypertension    Lumbago 04/05/2016   Macular degeneration    ? which eye(s)   Obstructive sleep apnea    Osteoporosis, senile    Overactive bladder    Squamous cell skin cancer 2/16   left lower leg     Medicines Meds ordered this encounter  Medications   acetaminophen  (TYLENOL ) tablet 1,000 mg    I have reviewed the patients home medicines and have made adjustments as needed  Problem List / ED Course: Problem List Items Addressed This Visit       Other   Fall - Primary   Other Visit Diagnoses       Acute midline thoracic back pain       Relevant Medications   acetaminophen  (TYLENOL ) tablet 1,000 mg (Completed)                 Final diagnoses:  Fall, initial encounter  Acute midline thoracic back pain    ED Discharge Orders     None          Danzel Marszalek, Charmaine FALCON, MD 08/16/24 [phone]

## 2024-08-16 NOTE — ED Notes (Signed)
 [facility] called, only one on list so far

## 2024-08-16 NOTE — ED Notes (Signed)
 Report, paperwork, and all belongings given to [facility].  Report given to facility by night RN.

## 2024-08-16 NOTE — ED Notes (Signed)
 Patient transported to X-ray

## 2024-08-16 NOTE — ED Notes (Signed)
 Pt up to ambulate w/assistance, pt tolerated better, states she thinks tylenol  helped.

## 2024-08-16 NOTE — Discharge Instructions (Addendum)
 You were seen today after a fall.  Make sure that you are taking measures to prevent falls at your facility.  You may take Tylenol  for any pain.

## 2024-08-17 ENCOUNTER — Non-Acute Institutional Stay: Payer: Self-pay | Admitting: Nurse Practitioner

## 2024-08-17 ENCOUNTER — Encounter: Payer: Self-pay | Admitting: Nurse Practitioner

## 2024-08-17 DIAGNOSIS — M150 Primary generalized (osteo)arthritis: Secondary | ICD-10-CM

## 2024-08-17 DIAGNOSIS — R410 Disorientation, unspecified: Secondary | ICD-10-CM

## 2024-08-17 DIAGNOSIS — R269 Unspecified abnormalities of gait and mobility: Secondary | ICD-10-CM | POA: Diagnosis not present

## 2024-08-17 DIAGNOSIS — F419 Anxiety disorder, unspecified: Secondary | ICD-10-CM | POA: Diagnosis not present

## 2024-08-17 DIAGNOSIS — I4892 Unspecified atrial flutter: Secondary | ICD-10-CM | POA: Diagnosis not present

## 2024-08-17 DIAGNOSIS — I10 Essential (primary) hypertension: Secondary | ICD-10-CM | POA: Diagnosis not present

## 2024-08-17 NOTE — Assessment & Plan Note (Signed)
 Ambulates with walker, increased fall risk due to increased frailty, close supervision assistance needed for safety.

## 2024-08-17 NOTE — Assessment & Plan Note (Signed)
 Lorazepam  prn available to her.  Allergic to as SSRI, failed Cymbalta , BuSpar

## 2024-08-17 NOTE — Assessment & Plan Note (Signed)
01/05/22, no recurrence, delay cardiology

## 2024-08-17 NOTE — Assessment & Plan Note (Signed)
 Stable, stable on Omeprazole , Hgb 11.4 08/04/24

## 2024-08-17 NOTE — Assessment & Plan Note (Signed)
 Blood pressure is controlled, taking Losartan , Metoprolol ,  takes ASA, Bun/creat 15/0.8 08/04/24

## 2024-08-17 NOTE — Assessment & Plan Note (Signed)
 Intermittent,  seems better since off BuSpar, duloxetine , Lexapro.

## 2024-08-17 NOTE — Progress Notes (Unsigned)
 Location:  [facility] Room Number: 820-A Place of Service:  ALF (13) Provider: Jia Dottavio, NP  Code Status: DNR Goals of Care:     08/17/2024   10:29 AM  Advanced Directives  Does Patient Have a Medical Advance Directive? Yes  Type of Estate agent of [place];Living will;Out of facility DNR (pink MOST or yellow form)  Does patient want to make changes to medical advance directive? No - Patient declined  Copy of Healthcare Power of Attorney in Chart? Yes - validated most recent copy scanned in chart (See row information)     Chief Complaint  Patient presents with  . Hospitalization Follow-up    HPI: Patient is a 89 y.o. female seen today for follow-up ED visit 08/16/2024  For evaluation of status post fall, CT cervical spine/head, x-ray left hip/pelvis, left knee, lumbar spine, except thoracic spine T12 compression deformity, cervical spinal stenosis, DDD, chronic neck, mid, lower back pain.   Intermittent confusion, seems better since off BuSpar, duloxetine , Lexapro.  Gait abnormality, ambulated with walker              Hx of near syncope: not new, no orthostatic hypotension, last episode of dizziness 12/10/23. 12/17/24 CT head: No acute intracranial pathology. 2. Advanced white matter disease, most likely related to chronic microangiopathy, similar to prior CT.             Edema BLE, not apparent, TED             OSA, CPAP, seeing pulmonology.              A-flutter 01/05/22, no recurrence, delay cardiology OP declined Alendronate  after initial consented.              Bone spur R foot, callus of foot, f/u podiatrist.              Allergic rhinitis, off Fluticasone nasal spray, Claritin , Atrovent  nasal spray.              Anxiety, Lorazepam  prn available to her.  Allergic to as SSRI, failed Cymbalta , BuSpar             Elevated TSH 5.07 06/11/22, TSH 5.11 02/10/23, TSH 3.49 08/11/23,  not on supplement.             HTN, taking Losartan ,  Metoprolol ,  takes ASA, Bun/creat 15/0.8 08/04/24             GERD, stable on Omeprazole , Hgb 11.4 08/04/24             Hyperlipidemia, off Atorvastatin , LDL 70 06/11/22             Prediabetes, diet controlled, Hgb a1c 5.8 08/11/23             Anemia, Iron 37 06/30/24, on Fe, Hgb 11.4 08/04/24  Past Medical History:  Diagnosis Date  . GERD (gastroesophageal reflux disease)   . Hx of cataract surgery    both eyes  . Hyperglycemia   . Hyperlipidemia   . Hypertension   . Lumbago 04/05/2016  . Macular degeneration    ? which eye(s)  . Obstructive sleep apnea   . Osteoporosis, senile   . Overactive bladder   . Squamous cell skin cancer 2/16   left lower leg    Past Surgical History:  Procedure Laterality Date  . ABDOMINAL HYSTERECTOMY  1960's  . APPENDECTOMY  1960's  . MOLE REMOVAL  2000   facial  .  TONSILLECTOMY      Allergies  Allergen Reactions  . Adhesive [Tape]   . Ampicillin   . Elemental Sulfur   . Nitrofuran Derivatives   . Zoloft  [Sertraline  Hcl] Other (See Comments)    Dizziness, nausea, faint feeling, and nightmares      Outpatient Encounter Medications as of 08/17/2024  Medication Sig  . acetaminophen  (TYLENOL ) 500 MG tablet Take 500 mg by mouth daily.   . aspirin  EC 81 MG tablet Take 1 tablet (81 mg total) by mouth daily.  . Calcium  Carb-Cholecalciferol 600-20 MG-MCG TABS Take 1 tablet by mouth 2 (two) times daily.  . ferrous sulfate  325 (65 FE) MG EC tablet Take 325 mg by mouth 3 (three) times a week. Monday, Wednesday, and Friday.  . losartan  (COZAAR ) 100 MG tablet Take 100 mg by mouth daily.  . metoprolol  succinate (TOPROL -XL) 25 MG 24 hr tablet TAKE 1/2 TABLET ONCE A DAY.  . Multiple Vitamins-Minerals (OCUVITE ADULT 50+) CAPS Take 1 capsule by mouth every evening.  . nystatin powder Apply 1 Application topically 2 (two) times daily. Apply to breast x 10 days beginning on 08/11/24  . omeprazole  (PRILOSEC) 40 MG capsule TAKE 1 CAPSULE DAILY 1/2 HOUR BEFORE BREAKFAST  FOR STOMACH ACID.  SABRA Polyethyl Glycol-Propyl Glycol (SYSTANE) 0.4-0.3 % GEL ophthalmic gel Place 1 application into both eyes daily.  . TRIAMCINOLONE ACETONIDE,NASAL, NA Place 2 sprays into the nose daily. Each nostril  . acetaminophen  (TYLENOL ) 325 MG tablet Take 325 mg by mouth every 4 (four) hours as needed. 2 tablets by mouth every 4 hours as need for fever and pain  . busPIRone (BUSPAR) 5 MG tablet Take 2.5 mg by mouth daily. (Patient not taking: Reported on 08/17/2024)  . loperamide (IMODIUM A-D) 2 MG tablet Take 2 mg by mouth every 6 (six) hours as needed for diarrhea or loose stools.  . LORazepam  (ATIVAN ) 0.5 MG tablet TAKE ONE TABLET ONCE DAILY AS NEEDED FOR ANXIETY.   No facility-administered encounter medications on file as of 08/17/2024.    Review of Systems:  Review of Systems  Constitutional:  Negative for appetite change, fatigue and fever.  HENT:  Negative for congestion, hearing loss and trouble swallowing.   Eyes:  Negative for visual disturbance.  Respiratory:  Negative for cough and shortness of breath.   Cardiovascular:  Negative for leg swelling.  Gastrointestinal:  Negative for abdominal pain and constipation.  Genitourinary:  Negative for dysuria and urgency.       Worsened from occasionally urinary leakage to frequent urinary incontinence even if more at night.   Musculoskeletal:  Positive for arthralgias, back pain, gait problem and neck pain.  Skin:  Negative for color change.  Neurological:  Negative for speech difficulty, weakness and light-headedness.       Tingling in toes sometimes, better after moves around.  Tingling and numbness in the right shin 04/06/24 resolved after rest and elevated leg.   Psychiatric/Behavioral:  Positive for confusion. Negative for sleep disturbance. The patient is nervous/anxious.        Intermittent     Health Maintenance  Topic Date Due  . INFLUENZA VACCINE  09/28/2024 (Originally 07/29/2024)  . COVID-19 Vaccine (8 - 2024-25  season) 09/28/2024 (Originally 12/09/2023)  . Zoster Vaccines- Shingrix (1 of 2) 12/29/2094 (Originally 01/23/1943)  . Medicare Annual Wellness (AWV)  10/26/2024  . DTaP/Tdap/Td (3 - Td or Tdap) 11/04/2033  . Pneumococcal Vaccine: 50+ Years  Completed  . DEXA SCAN  Completed  . HPV VACCINES  Aged Out  . Meningococcal B Vaccine  Aged Out    Physical Exam: Vitals:   08/17/24 1024  BP: 137/68  Pulse: 75  Resp: 18  Temp: 97.9 F (36.6 C)  SpO2: 96%  Weight: 163 lb 3.2 oz (74 kg)  Height: 5' 7 (1.702 m)   Body mass index is 25.56 kg/m. Physical Exam Vitals and nursing note reviewed.  Constitutional:      Appearance: Normal appearance.  HENT:     Head: Normocephalic and atraumatic.     Mouth/Throat:     Mouth: Mucous membranes are moist.  Eyes:     Extraocular Movements: Extraocular movements intact.     Conjunctiva/sclera: Conjunctivae normal.     Pupils: Pupils are equal, round, and reactive to light.  Cardiovascular:     Rate and Rhythm: Normal rate and regular rhythm.     Heart sounds: No murmur heard. Pulmonary:     Effort: Pulmonary effort is normal.     Breath sounds: Normal breath sounds. No rales.  Abdominal:     General: Bowel sounds are normal.     Palpations: Abdomen is soft.     Tenderness: There is no abdominal tenderness.  Musculoskeletal:        General: Tenderness present.     Cervical back: Normal range of motion and neck supple.     Right lower leg: No edema.     Left lower leg: No edema.     Comments: Chronic neck,  middle back, and lower back pain.  No spine no spinous process tenderness palpated  Skin:    General: Skin is warm and dry.     Findings: Rash present.     Comments: Redness noted under the right and the left breast skin fold  Neurological:     General: No focal deficit present.     Mental Status: She is alert and oriented to person, place, and time. Mental status is at baseline.     Gait: Gait abnormal.  Psychiatric:        Mood  and Affect: Mood normal.        Behavior: Behavior normal.        Thought Content: Thought content normal.     Labs reviewed: Basic Metabolic Panel: Recent Labs    04/07/24 0000 07/09/24 0848 08/04/24 0000  NA 139 138 138  K 3.8 4.1 3.8  CL 105 107 106  CO2 25* 19 29*  BUN 21 18 15   CREATININE 1.0 0.8 0.8  CALCIUM  9.3 10.0 9.4   Liver Function Tests: Recent Labs    03/29/24 0000 04/07/24 0000 07/09/24 0848 08/04/24 0000  AST 19 21 23 18   ALT 13 15 14 12   ALKPHOS 73 83 72 66  ALBUMIN 4.1  --  4.4 3.8   No results for input(s): LIPASE, AMYLASE in the last 8760 hours. No results for input(s): AMMONIA in the last 8760 hours. CBC: Recent Labs    06/30/24 0000 07/09/24 0848 08/04/24 0000  WBC 7.5 6.8 6.0  NEUTROABS 4,508.00 4,162.00 3,390.00  HGB 11.9* 11.8* 11.4*  HCT 37 36 36  PLT 212 193 157   Lipid Panel: No results for input(s): CHOL, HDL, LDLCALC, TRIG, CHOLHDL, LDLDIRECT in the last 8760 hours. Lab Results  Component Value Date   HGBA1C 5.8 08/11/2023    Procedures since last visit: DG Thoracic Spine 2 View Result Date: 08/16/2024 EXAM: 2 VIEW(S) XRAY OF THE THORACIC SPINE 08/16/2024 06:12:00 AM COMPARISON: CT of the thoracic  spine dated 06/05/2023. CLINICAL HISTORY: Fall. FINDINGS: BONES: No acute fracture. No aggressive appearing osseous lesion. Alignment is normal. DISCS AND DEGENERATIVE CHANGES: Moderate chronic degenerative disc disease at T11-12 with disc space narrowing and sclerosis of the endplates anteriorly. SOFT TISSUES: The visualized lungs are clear. IMPRESSION: 1. No acute fracture. 2. Moderate chronic degenerative disc disease at T11-T12 with disc space narrowing and sclerosis of the endplates anteriorly. Electronically signed by: Evalene Coho MD 08/16/2024 06:21 AM EDT RP Workstation: [id number]   CT Cervical Spine Wo Contrast Result Date: 08/16/2024 EXAM: CT CERVICAL SPINE WITHOUT CONTRAST 08/16/2024 05:48:24 AM  TECHNIQUE: CT of the cervical spine was performed without the administration of intravenous contrast. Multiplanar reformatted images are provided for review. Automated exposure control, iterative reconstruction, and/or weight based adjustment of the mA/kV was utilized to reduce the radiation dose to as low as reasonably achievable. COMPARISON: CT of the cervical spine dated 12/18/2023. CLINICAL HISTORY: Neck trauma (Age >= 65y). Pt to ED via [facility] from [facility] nursing facility. Pt had an unwitnessed fall, c/o right knee and hip pain and back pain. Pt does not take blood thinners. Pt states she did not hit her head and did not have LOC. FINDINGS: CERVICAL SPINE: BONES AND ALIGNMENT: No acute fracture or traumatic malalignment. DEGENERATIVE CHANGES: There is slight degenerative anterolisthesis at C3-4 with moderate bilateral facet arthrosis. There is also slight degenerative anterolisthesis at C4-5. At C5-6, there is disc space narrowing and diffuse posterior endplate ridging with moderate central spinal canal stenosis and moderate-to-severe bilateral neural foraminal stenosis. At C6-7 there is moderate central spinal canal stenosis and moderate bilateral neural foraminal stenosis. There is moderate diffuse facet arthrosis throughout the cervical spine. SOFT TISSUES: No prevertebral soft tissue swelling. IMPRESSION: 1. No acute abnormality of the cervical spine related to the reported neck trauma. 2. Slight degenerative anterolisthesis at C3-4 and C4-5 with moderate bilateral facet arthrosis at C3-4. 3. Disc space narrowing and diffuse posterior endplate ridging at C5-6 with moderate central spinal canal stenosis and moderate-to-severe bilateral neural foraminal stenosis. 4. Moderate central spinal canal stenosis and moderate bilateral neural foraminal stenosis at C6-7. 5. Moderate diffuse facet arthrosis throughout the cervical spine. Electronically signed by: Evalene Coho MD 08/16/2024 05:57 AM  EDT RP Workstation: [id number]   DG Hip Unilat W or Wo Pelvis 2-3 Views Left Result Date: 08/16/2024 CLINICAL DATA:  Fall injury with blunt polytrauma. EXAM: DG HIP (WITH OR WITHOUT PELVIS) 2-3V LEFT; LUMBAR SPINE - COMPLETE 4+ VIEW; LEFT KNEE - COMPLETE 4+ VIEW COMPARISON:  Lumbar spine CT 06/03/2021, CT pelvis 06/03/2021. No prior left knee series. FINDINGS: Lumbar spine, four views with AP Lat, both obliques: There is mild-to-moderate dextrorotary lumbar scoliosis, apex L2. Lateral view again demonstrating minimal grade 1 degenerative retrolisthesis at T12-L1, L1-2 and L2-3. Osteopenia. There is mild chronic wedge compression deformity of T12. The lumbar vertebrae are normal in heights without evidence of acute fracture. There is multilevel bridging marginal osteophytosis to the left. The discs are degenerated and collapsed with vacuum phenomenon at L3-4 and L5-S1, disc space loss also at L1-2 and L2-3. There is facet hypertrophy from L1-2 down, greatest at the lowest 3 levels where it is greater on the right Both SI joints are patent. There is patchy calcific plaque in the abdominal aorta. Three views AP pelvis and AP and frog-leg left hip: There is no evidence of pelvic fracture or diastasis. No evidence of left hip fracture or dislocation. Joint spaces are maintained. Osteopenia. There is no  other focal bone abnormality. Pelvic phleboliths. Soft tissues are otherwise unremarkable. Left knee, four views: There is osteopenia. No evidence of fractures, dislocation, arthropathy or other focal bone abnormality. No joint effusion is seen. There R calcifications in the distal quadriceps tendon. Scattered phleboliths in the upper foreleg. Soft tissues are otherwise unremarkable. IMPRESSION: 1. Osteopenia and degenerative change without evidence of acute fractures of the lumbar spine, pelvis, left hip or left knee. 2. Chronic mild wedge compression deformity of T12. 3. Aortic atherosclerosis. 4. Calcifications in  the distal quadriceps tendon. Electronically Signed   By: Francis Quam M.D.   On: 08/16/2024 04:38   DG Knee Complete 4 Views Left Result Date: 08/16/2024 CLINICAL DATA:  Fall injury with blunt polytrauma. EXAM: DG HIP (WITH OR WITHOUT PELVIS) 2-3V LEFT; LUMBAR SPINE - COMPLETE 4+ VIEW; LEFT KNEE - COMPLETE 4+ VIEW COMPARISON:  Lumbar spine CT 06/03/2021, CT pelvis 06/03/2021. No prior left knee series. FINDINGS: Lumbar spine, four views with AP Lat, both obliques: There is mild-to-moderate dextrorotary lumbar scoliosis, apex L2. Lateral view again demonstrating minimal grade 1 degenerative retrolisthesis at T12-L1, L1-2 and L2-3. Osteopenia. There is mild chronic wedge compression deformity of T12. The lumbar vertebrae are normal in heights without evidence of acute fracture. There is multilevel bridging marginal osteophytosis to the left. The discs are degenerated and collapsed with vacuum phenomenon at L3-4 and L5-S1, disc space loss also at L1-2 and L2-3. There is facet hypertrophy from L1-2 down, greatest at the lowest 3 levels where it is greater on the right Both SI joints are patent. There is patchy calcific plaque in the abdominal aorta. Three views AP pelvis and AP and frog-leg left hip: There is no evidence of pelvic fracture or diastasis. No evidence of left hip fracture or dislocation. Joint spaces are maintained. Osteopenia. There is no other focal bone abnormality. Pelvic phleboliths. Soft tissues are otherwise unremarkable. Left knee, four views: There is osteopenia. No evidence of fractures, dislocation, arthropathy or other focal bone abnormality. No joint effusion is seen. There R calcifications in the distal quadriceps tendon. Scattered phleboliths in the upper foreleg. Soft tissues are otherwise unremarkable. IMPRESSION: 1. Osteopenia and degenerative change without evidence of acute fractures of the lumbar spine, pelvis, left hip or left knee. 2. Chronic mild wedge compression deformity of  T12. 3. Aortic atherosclerosis. 4. Calcifications in the distal quadriceps tendon. Electronically Signed   By: Francis Quam M.D.   On: 08/16/2024 04:38   DG Lumbar Spine Complete Result Date: 08/16/2024 CLINICAL DATA:  Fall injury with blunt polytrauma. EXAM: DG HIP (WITH OR WITHOUT PELVIS) 2-3V LEFT; LUMBAR SPINE - COMPLETE 4+ VIEW; LEFT KNEE - COMPLETE 4+ VIEW COMPARISON:  Lumbar spine CT 06/03/2021, CT pelvis 06/03/2021. No prior left knee series. FINDINGS: Lumbar spine, four views with AP Lat, both obliques: There is mild-to-moderate dextrorotary lumbar scoliosis, apex L2. Lateral view again demonstrating minimal grade 1 degenerative retrolisthesis at T12-L1, L1-2 and L2-3. Osteopenia. There is mild chronic wedge compression deformity of T12. The lumbar vertebrae are normal in heights without evidence of acute fracture. There is multilevel bridging marginal osteophytosis to the left. The discs are degenerated and collapsed with vacuum phenomenon at L3-4 and L5-S1, disc space loss also at L1-2 and L2-3. There is facet hypertrophy from L1-2 down, greatest at the lowest 3 levels where it is greater on the right Both SI joints are patent. There is patchy calcific plaque in the abdominal aorta. Three views AP pelvis and AP and frog-leg left  hip: There is no evidence of pelvic fracture or diastasis. No evidence of left hip fracture or dislocation. Joint spaces are maintained. Osteopenia. There is no other focal bone abnormality. Pelvic phleboliths. Soft tissues are otherwise unremarkable. Left knee, four views: There is osteopenia. No evidence of fractures, dislocation, arthropathy or other focal bone abnormality. No joint effusion is seen. There R calcifications in the distal quadriceps tendon. Scattered phleboliths in the upper foreleg. Soft tissues are otherwise unremarkable. IMPRESSION: 1. Osteopenia and degenerative change without evidence of acute fractures of the lumbar spine, pelvis, left hip or left  knee. 2. Chronic mild wedge compression deformity of T12. 3. Aortic atherosclerosis. 4. Calcifications in the distal quadriceps tendon. Electronically Signed   By: Francis Quam M.D.   On: 08/16/2024 04:38   CT Head Wo Contrast Result Date: 08/16/2024 EXAM: CT HEAD WITHOUT CONTRAST 08/16/2024 03:58:38 AM TECHNIQUE: CT of the head was performed without the administration of intravenous contrast. Automated exposure control, iterative reconstruction, and/or weight based adjustment of the mA/kV was utilized to reduce the radiation dose to as low as reasonably achievable. COMPARISON: 12/18/2023. CLINICAL HISTORY: Head trauma, minor (Age >= 65y). Fall FINDINGS: BRAIN AND VENTRICLES: No acute hemorrhage. Gray-white differentiation is preserved. No hydrocephalus. No extra-axial collection. No mass effect or midline shift. Age-related volume loss and extensive cerebral white matter disease. ORBITS: The patient is status post bilateral lens replacement. SINUSES: No acute abnormality. SOFT TISSUES AND SKULL: No acute soft tissue abnormality. No skull fracture. IMPRESSION: 1. No acute intracranial abnormality. 2. Age-related volume loss and extensive cerebral white matter disease. Electronically signed by: Evalene Coho MD 08/16/2024 04:09 AM EDT RP Workstation: [id number]    Assessment/Plan Abnormality of gait Ambulates with walker, increased fall risk due to increased frailty, close supervision assistance needed for safety.  Confusion Intermittent,  seems better since off BuSpar, duloxetine , Lexapro.  Osteoarthritis, multiple sites  thoracic spine T12 compression deformity, cervical spinal stenosis, DDD, chronic neck, mid, lower back pain.   Atrial flutter (HCC) 01/05/22, no recurrence, delay cardiology  Anxiety Lorazepam  prn available to her.  Allergic to as SSRI, failed Cymbalta , BuSpar  HTN (hypertension) Blood pressure is controlled, taking Losartan , Metoprolol ,  takes ASA, Bun/creat 15/0.8  08/04/24     Labs/tests ordered: None  Plan of care reviewed with the patient, Child psychotherapist, and charge nurse

## 2024-08-17 NOTE — Assessment & Plan Note (Signed)
 thoracic spine T12 compression deformity, cervical spinal stenosis, DDD, chronic neck, mid, lower back pain.

## 2024-08-31 ENCOUNTER — Non-Acute Institutional Stay: Payer: Self-pay | Admitting: Nurse Practitioner

## 2024-08-31 ENCOUNTER — Encounter: Payer: Self-pay | Admitting: Nurse Practitioner

## 2024-08-31 DIAGNOSIS — F419 Anxiety disorder, unspecified: Secondary | ICD-10-CM

## 2024-08-31 DIAGNOSIS — E782 Mixed hyperlipidemia: Secondary | ICD-10-CM | POA: Diagnosis not present

## 2024-08-31 DIAGNOSIS — D509 Iron deficiency anemia, unspecified: Secondary | ICD-10-CM | POA: Diagnosis not present

## 2024-08-31 DIAGNOSIS — K219 Gastro-esophageal reflux disease without esophagitis: Secondary | ICD-10-CM | POA: Diagnosis not present

## 2024-08-31 DIAGNOSIS — I10 Essential (primary) hypertension: Secondary | ICD-10-CM

## 2024-08-31 DIAGNOSIS — I4892 Unspecified atrial flutter: Secondary | ICD-10-CM | POA: Diagnosis not present

## 2024-08-31 DIAGNOSIS — R7303 Prediabetes: Secondary | ICD-10-CM | POA: Diagnosis not present

## 2024-08-31 DIAGNOSIS — M8580 Other specified disorders of bone density and structure, unspecified site: Secondary | ICD-10-CM

## 2024-08-31 DIAGNOSIS — Z66 Do not resuscitate: Secondary | ICD-10-CM | POA: Diagnosis not present

## 2024-08-31 DIAGNOSIS — Z780 Asymptomatic menopausal state: Secondary | ICD-10-CM | POA: Diagnosis not present

## 2024-08-31 DIAGNOSIS — G4733 Obstructive sleep apnea (adult) (pediatric): Secondary | ICD-10-CM | POA: Diagnosis not present

## 2024-08-31 NOTE — Assessment & Plan Note (Signed)
 Uses CPAP, seeing pulmonology.

## 2024-08-31 NOTE — Assessment & Plan Note (Signed)
 Iron 37 06/30/24, on Fe, Hgb 11.4 08/04/24

## 2024-08-31 NOTE — Progress Notes (Signed)
 Location:  [facility] Room Number: AL 820-A Place of Service:  ALF (13) Provider:  Olamide Lahaie X, NP  Patient Care Team: Alinda Egolf X, NP as PCP - General (Internal Medicine) Bryony Kaman X, NP as Nurse Practitioner (Internal Medicine)  Extended Emergency Contact Information Primary Emergency Contact: [facility] Address: [address]          [place], [place] [address] [place] Home Phone: [phone] Mobile Phone: [phone] Relation: Son  Code Status:  DNR Goals of care: Advanced Directive information    08/17/2024   10:29 AM  Advanced Directives  Does Patient Have a Medical Advance Directive? Yes  Type of Estate agent of [place];Living will;Out of facility DNR (pink MOST or yellow form)  Does patient want to make changes to medical advance directive? No - Patient declined  Copy of Healthcare Power of Attorney in Chart? Yes - validated most recent copy scanned in chart (See row information)     Chief Complaint  Patient presents with   Medical Management of Chronic Issues    Routine visit     HPI:  Pt is a 89 y.o. female seen today for medical management of chronic diseases.     Intermittent confusion, seems better since off BuSpar, duloxetine , Lexapro.             Gait abnormality, ambulated with walker              Hx of near syncope: not new, no orthostatic hypotension, last episode of dizziness 12/10/23. 12/17/24 CT head: No acute intracranial pathology. 2. Advanced white matter disease, most likely related to chronic microangiopathy, similar to prior CT.             Edema BLE, not apparent, TED             OSA, CPAP, seeing pulmonology.              A-flutter 01/05/22, no recurrence, delay cardiology consultation OP declined Alendronate  after initial consented.  Taking calcium  and vitamin D             Bone spur R foot, callus of foot, f/u podiatrist.              Allergic rhinitis, off Fluticasone nasal  spray, Claritin , Atrovent  nasal spray.              Anxiety, Lorazepam  prn available to her.  Allergic to as SSRI, failed Cymbalta , BuSpar             Elevated TSH 5.07 06/11/22, TSH 5.11 02/10/23, TSH 3.49 08/11/23,  not on supplement.             HTN, taking Losartan , Metoprolol ,  takes ASA, Bun/creat 15/0.8 08/04/24             GERD, stable on Omeprazole , Hgb 11.4 08/04/24             Hyperlipidemia, off Atorvastatin , LDL 70 06/11/22             Prediabetes, diet controlled, Hgb a1c 5.8 08/11/23             Anemia, Iron 37 06/30/24, on Fe, Hgb 11.4 08/04/24    Past Medical History:  Diagnosis Date   GERD (gastroesophageal reflux disease)    Hx of cataract surgery    both eyes   Hyperglycemia    Hyperlipidemia    Hypertension    Lumbago 04/05/2016   Macular degeneration    ?  which eye(s)   Obstructive sleep apnea    Osteoporosis, senile    Overactive bladder    Squamous cell skin cancer 2/16   left lower leg   Past Surgical History:  Procedure Laterality Date   ABDOMINAL HYSTERECTOMY  1960's   APPENDECTOMY  1960's   MOLE REMOVAL  2000   facial   TONSILLECTOMY      Allergies  Allergen Reactions   Adhesive [Tape]    Ampicillin    Elemental Sulfur    Nitrofuran Derivatives    Zoloft  [Sertraline  Hcl] Other (See Comments)    Dizziness, nausea, faint feeling, and nightmares      Outpatient Encounter Medications as of 08/31/2024  Medication Sig   acetaminophen  (TYLENOL ) 325 MG tablet Take 650 mg by mouth every 4 (four) hours as needed. 2 tablets by mouth every 4 hours as need for fever and pain   acetaminophen  (TYLENOL ) 500 MG tablet Take 500 mg by mouth daily.    aspirin  EC 81 MG tablet Take 1 tablet (81 mg total) by mouth daily.   Calcium  Carb-Cholecalciferol 600-20 MG-MCG TABS Take 1 tablet by mouth 2 (two) times daily.   ferrous sulfate  325 (65 FE) MG EC tablet Take 325 mg by mouth 3 (three) times a week. Monday, Wednesday, and Friday.   loperamide (IMODIUM A-D) 2 MG tablet Take 2  mg by mouth every 6 (six) hours as needed for diarrhea or loose stools.   LORazepam  (ATIVAN ) 0.5 MG tablet TAKE ONE TABLET ONCE DAILY AS NEEDED FOR ANXIETY.   losartan  (COZAAR ) 100 MG tablet Take 100 mg by mouth daily.   metoprolol  succinate (TOPROL -XL) 25 MG 24 hr tablet TAKE 1/2 TABLET ONCE A DAY.   Multiple Vitamins-Minerals (OCUVITE ADULT 50+) CAPS Take 1 capsule by mouth every evening.   omeprazole  (PRILOSEC) 40 MG capsule TAKE 1 CAPSULE DAILY 1/2 HOUR BEFORE BREAKFAST FOR STOMACH ACID.   Polyethyl Glycol-Propyl Glycol (SYSTANE) 0.4-0.3 % GEL ophthalmic gel Place 1 application into both eyes daily.   TRIAMCINOLONE ACETONIDE,NASAL, NA Place 2 sprays into the nose daily. Each nostril   [DISCONTINUED] busPIRone (BUSPAR) 5 MG tablet Take 2.5 mg by mouth daily. (Patient not taking: Reported on 08/31/2024)   [DISCONTINUED] nystatin powder Apply 1 Application topically 2 (two) times daily. Apply to breast x 10 days beginning on 08/11/24 (Patient not taking: Reported on 08/31/2024)   No facility-administered encounter medications on file as of 08/31/2024.    Review of Systems  Constitutional:  Negative for appetite change, fatigue and fever.  HENT:  Negative for congestion, hearing loss and trouble swallowing.   Eyes:  Negative for visual disturbance.  Respiratory:  Negative for cough and shortness of breath.   Cardiovascular:  Negative for leg swelling.  Gastrointestinal:  Negative for abdominal pain and constipation.  Genitourinary:  Negative for dysuria and urgency.       Worsened from occasionally urinary leakage to frequent urinary incontinence even if more at night.   Musculoskeletal:  Positive for arthralgias, back pain, gait problem and neck pain.  Skin:  Negative for color change.  Neurological:  Negative for speech difficulty, weakness and light-headedness.       Tingling in toes sometimes, better after moves around.  Tingling and numbness in the right shin 04/06/24 resolved after rest and  elevated leg.   Psychiatric/Behavioral:  Positive for confusion. Negative for sleep disturbance. The patient is nervous/anxious.        Intermittent     Immunization History  Administered Date(s) Administered  Fluad Quad(high Dose 65+) 10/12/2019, 10/14/2022   INFLUENZA, HIGH DOSE SEASONAL PF 10/07/2017, 09/28/2021   Influenza Whole 09/30/2018   Influenza-Unspecified 09/27/2015, 10/09/2016, 10/10/2020, 09/30/2023   Moderna Covid-19 Fall Seasonal Vaccine 83yrs & older 10/14/2023   Moderna Covid-19 Vaccine Bivalent Booster 17yrs & up 10/30/2022   Moderna SARS-COV2 Booster Vaccination 10/14/2023   Moderna Sars-Covid-2 Vaccination 01/02/2020, 01/30/2020, 11/06/2020, 05/28/2021, 05/16/2022   PFIZER(Purple Top)SARS-COV-2 Vaccination 09/17/2021   Pfizer Fall 2024 Covid-19 Vaccine 6mos thru 28yrs 05/16/2022   Pneumococcal Conjugate-13 01/23/2015   Pneumococcal Polysaccharide-23 10/20/2007   Respiratory Syncytial Virus Vaccine,Recomb Aduvanted(Arexvy) 12/26/2022   Td 12/14/2012   Tdap 11/05/2023   Pertinent  Health Maintenance Due  Topic Date Due   INFLUENZA VACCINE  09/28/2024 (Originally 07/29/2024)   DEXA SCAN  Completed      03/05/2023    2:48 PM 03/12/2023    3:32 PM 04/23/2023    1:49 PM 05/20/2023    2:12 PM 08/14/2023   12:41 PM  Fall Risk  Falls in the past year? 0 0 0 0 0  Was there an injury with Fall? 0 0 0 0 0  Fall Risk Category Calculator 0 0 0 0 0  Patient at Risk for Falls Due to No Fall Risks Impaired balance/gait;Impaired mobility History of fall(s) History of fall(s)   Fall risk Follow up Falls evaluation completed Falls evaluation completed Falls evaluation completed Falls evaluation completed Falls evaluation completed   Functional Status Survey:    Vitals:   08/31/24 0921  BP: 137/68  Pulse: 70  Weight: 163 lb 3.2 oz (74 kg)  Height: 5' 7 (1.702 m)   Body mass index is 25.56 kg/m. Physical Exam Vitals and nursing note reviewed.  Constitutional:       Appearance: Normal appearance.  HENT:     Head: Normocephalic and atraumatic.     Mouth/Throat:     Mouth: Mucous membranes are moist.  Eyes:     Extraocular Movements: Extraocular movements intact.     Conjunctiva/sclera: Conjunctivae normal.     Pupils: Pupils are equal, round, and reactive to light.  Cardiovascular:     Rate and Rhythm: Normal rate and regular rhythm.     Heart sounds: No murmur heard. Pulmonary:     Effort: Pulmonary effort is normal.     Breath sounds: Normal breath sounds. No rales.  Abdominal:     General: Bowel sounds are normal.     Palpations: Abdomen is soft.     Tenderness: There is no abdominal tenderness.  Musculoskeletal:        General: Tenderness present.     Cervical back: Normal range of motion and neck supple.     Right lower leg: No edema.     Left lower leg: No edema.     Comments: Chronic neck,  middle back, and lower back pain.  No spine no spinous process tenderness palpated  Skin:    General: Skin is warm and dry.     Findings: No rash.  Neurological:     General: No focal deficit present.     Mental Status: She is alert and oriented to person, place, and time. Mental status is at baseline.     Gait: Gait abnormal.  Psychiatric:        Mood and Affect: Mood normal.        Behavior: Behavior normal.        Thought Content: Thought content normal.     Labs reviewed: Recent Labs  04/07/24 0000 07/09/24 0848 08/04/24 0000  NA 139 138 138  K 3.8 4.1 3.8  CL 105 107 106  CO2 25* 19 29*  BUN 21 18 15   CREATININE 1.0 0.8 0.8  CALCIUM  9.3 10.0 9.4   Recent Labs    03/29/24 0000 04/07/24 0000 07/09/24 0848 08/04/24 0000  AST 19 21 23 18   ALT 13 15 14 12   ALKPHOS 73 83 72 66  ALBUMIN 4.1  --  4.4 3.8   Recent Labs    06/30/24 0000 07/09/24 0848 08/04/24 0000  WBC 7.5 6.8 6.0  NEUTROABS 4,508.00 4,162.00 3,390.00  HGB 11.9* 11.8* 11.4*  HCT 37 36 36  PLT 212 193 157   Lab Results  Component Value Date   TSH  3.49 08/11/2023   Lab Results  Component Value Date   HGBA1C 5.8 08/11/2023   Lab Results  Component Value Date   CHOL 139 06/11/2022   HDL 56 06/11/2022   LDLCALC 70 06/11/2022   TRIG 57 06/11/2022   CHOLHDL 2.5 06/11/2022    Significant Diagnostic Results in last 30 days:  DG Thoracic Spine 2 View Result Date: 08/16/2024 EXAM: 2 VIEW(S) XRAY OF THE THORACIC SPINE 08/16/2024 06:12:00 AM COMPARISON: CT of the thoracic spine dated 06/05/2023. CLINICAL HISTORY: Fall. FINDINGS: BONES: No acute fracture. No aggressive appearing osseous lesion. Alignment is normal. DISCS AND DEGENERATIVE CHANGES: Moderate chronic degenerative disc disease at T11-12 with disc space narrowing and sclerosis of the endplates anteriorly. SOFT TISSUES: The visualized lungs are clear. IMPRESSION: 1. No acute fracture. 2. Moderate chronic degenerative disc disease at T11-T12 with disc space narrowing and sclerosis of the endplates anteriorly. Electronically signed by: Evalene Coho MD 08/16/2024 06:21 AM EDT RP Workstation: [id number]   CT Cervical Spine Wo Contrast Result Date: 08/16/2024 EXAM: CT CERVICAL SPINE WITHOUT CONTRAST 08/16/2024 05:48:24 AM TECHNIQUE: CT of the cervical spine was performed without the administration of intravenous contrast. Multiplanar reformatted images are provided for review. Automated exposure control, iterative reconstruction, and/or weight based adjustment of the mA/kV was utilized to reduce the radiation dose to as low as reasonably achievable. COMPARISON: CT of the cervical spine dated 12/18/2023. CLINICAL HISTORY: Neck trauma (Age >= 65y). Pt to ED via [facility] from [facility]. Pt had an unwitnessed fall, c/o right knee and hip pain and back pain. Pt does not take blood thinners. Pt states she did not hit her head and did not have LOC. FINDINGS: CERVICAL SPINE: BONES AND ALIGNMENT: No acute fracture or traumatic malalignment. DEGENERATIVE CHANGES: There is  slight degenerative anterolisthesis at C3-4 with moderate bilateral facet arthrosis. There is also slight degenerative anterolisthesis at C4-5. At C5-6, there is disc space narrowing and diffuse posterior endplate ridging with moderate central spinal canal stenosis and moderate-to-severe bilateral neural foraminal stenosis. At C6-7 there is moderate central spinal canal stenosis and moderate bilateral neural foraminal stenosis. There is moderate diffuse facet arthrosis throughout the cervical spine. SOFT TISSUES: No prevertebral soft tissue swelling. IMPRESSION: 1. No acute abnormality of the cervical spine related to the reported neck trauma. 2. Slight degenerative anterolisthesis at C3-4 and C4-5 with moderate bilateral facet arthrosis at C3-4. 3. Disc space narrowing and diffuse posterior endplate ridging at C5-6 with moderate central spinal canal stenosis and moderate-to-severe bilateral neural foraminal stenosis. 4. Moderate central spinal canal stenosis and moderate bilateral neural foraminal stenosis at C6-7. 5. Moderate diffuse facet arthrosis throughout the cervical spine. Electronically signed by: Evalene Coho MD 08/16/2024 05:57 AM EDT  RP Workstation: [id number]   DG Hip Unilat W or Wo Pelvis 2-3 Views Left Result Date: 08/16/2024 CLINICAL DATA:  Fall injury with blunt polytrauma. EXAM: DG HIP (WITH OR WITHOUT PELVIS) 2-3V LEFT; LUMBAR SPINE - COMPLETE 4+ VIEW; LEFT KNEE - COMPLETE 4+ VIEW COMPARISON:  Lumbar spine CT 06/03/2021, CT pelvis 06/03/2021. No prior left knee series. FINDINGS: Lumbar spine, four views with AP Lat, both obliques: There is mild-to-moderate dextrorotary lumbar scoliosis, apex L2. Lateral view again demonstrating minimal grade 1 degenerative retrolisthesis at T12-L1, L1-2 and L2-3. Osteopenia. There is mild chronic wedge compression deformity of T12. The lumbar vertebrae are normal in heights without evidence of acute fracture. There is multilevel bridging marginal  osteophytosis to the left. The discs are degenerated and collapsed with vacuum phenomenon at L3-4 and L5-S1, disc space loss also at L1-2 and L2-3. There is facet hypertrophy from L1-2 down, greatest at the lowest 3 levels where it is greater on the right Both SI joints are patent. There is patchy calcific plaque in the abdominal aorta. Three views AP pelvis and AP and frog-leg left hip: There is no evidence of pelvic fracture or diastasis. No evidence of left hip fracture or dislocation. Joint spaces are maintained. Osteopenia. There is no other focal bone abnormality. Pelvic phleboliths. Soft tissues are otherwise unremarkable. Left knee, four views: There is osteopenia. No evidence of fractures, dislocation, arthropathy or other focal bone abnormality. No joint effusion is seen. There R calcifications in the distal quadriceps tendon. Scattered phleboliths in the upper foreleg. Soft tissues are otherwise unremarkable. IMPRESSION: 1. Osteopenia and degenerative change without evidence of acute fractures of the lumbar spine, pelvis, left hip or left knee. 2. Chronic mild wedge compression deformity of T12. 3. Aortic atherosclerosis. 4. Calcifications in the distal quadriceps tendon. Electronically Signed   By: Francis Quam M.D.   On: 08/16/2024 04:38   DG Knee Complete 4 Views Left Result Date: 08/16/2024 CLINICAL DATA:  Fall injury with blunt polytrauma. EXAM: DG HIP (WITH OR WITHOUT PELVIS) 2-3V LEFT; LUMBAR SPINE - COMPLETE 4+ VIEW; LEFT KNEE - COMPLETE 4+ VIEW COMPARISON:  Lumbar spine CT 06/03/2021, CT pelvis 06/03/2021. No prior left knee series. FINDINGS: Lumbar spine, four views with AP Lat, both obliques: There is mild-to-moderate dextrorotary lumbar scoliosis, apex L2. Lateral view again demonstrating minimal grade 1 degenerative retrolisthesis at T12-L1, L1-2 and L2-3. Osteopenia. There is mild chronic wedge compression deformity of T12. The lumbar vertebrae are normal in heights without evidence of  acute fracture. There is multilevel bridging marginal osteophytosis to the left. The discs are degenerated and collapsed with vacuum phenomenon at L3-4 and L5-S1, disc space loss also at L1-2 and L2-3. There is facet hypertrophy from L1-2 down, greatest at the lowest 3 levels where it is greater on the right Both SI joints are patent. There is patchy calcific plaque in the abdominal aorta. Three views AP pelvis and AP and frog-leg left hip: There is no evidence of pelvic fracture or diastasis. No evidence of left hip fracture or dislocation. Joint spaces are maintained. Osteopenia. There is no other focal bone abnormality. Pelvic phleboliths. Soft tissues are otherwise unremarkable. Left knee, four views: There is osteopenia. No evidence of fractures, dislocation, arthropathy or other focal bone abnormality. No joint effusion is seen. There R calcifications in the distal quadriceps tendon. Scattered phleboliths in the upper foreleg. Soft tissues are otherwise unremarkable. IMPRESSION: 1. Osteopenia and degenerative change without evidence of acute fractures of the lumbar spine, pelvis, left hip  or left knee. 2. Chronic mild wedge compression deformity of T12. 3. Aortic atherosclerosis. 4. Calcifications in the distal quadriceps tendon. Electronically Signed   By: Francis Quam M.D.   On: 08/16/2024 04:38   DG Lumbar Spine Complete Result Date: 08/16/2024 CLINICAL DATA:  Fall injury with blunt polytrauma. EXAM: DG HIP (WITH OR WITHOUT PELVIS) 2-3V LEFT; LUMBAR SPINE - COMPLETE 4+ VIEW; LEFT KNEE - COMPLETE 4+ VIEW COMPARISON:  Lumbar spine CT 06/03/2021, CT pelvis 06/03/2021. No prior left knee series. FINDINGS: Lumbar spine, four views with AP Lat, both obliques: There is mild-to-moderate dextrorotary lumbar scoliosis, apex L2. Lateral view again demonstrating minimal grade 1 degenerative retrolisthesis at T12-L1, L1-2 and L2-3. Osteopenia. There is mild chronic wedge compression deformity of T12. The lumbar  vertebrae are normal in heights without evidence of acute fracture. There is multilevel bridging marginal osteophytosis to the left. The discs are degenerated and collapsed with vacuum phenomenon at L3-4 and L5-S1, disc space loss also at L1-2 and L2-3. There is facet hypertrophy from L1-2 down, greatest at the lowest 3 levels where it is greater on the right Both SI joints are patent. There is patchy calcific plaque in the abdominal aorta. Three views AP pelvis and AP and frog-leg left hip: There is no evidence of pelvic fracture or diastasis. No evidence of left hip fracture or dislocation. Joint spaces are maintained. Osteopenia. There is no other focal bone abnormality. Pelvic phleboliths. Soft tissues are otherwise unremarkable. Left knee, four views: There is osteopenia. No evidence of fractures, dislocation, arthropathy or other focal bone abnormality. No joint effusion is seen. There R calcifications in the distal quadriceps tendon. Scattered phleboliths in the upper foreleg. Soft tissues are otherwise unremarkable. IMPRESSION: 1. Osteopenia and degenerative change without evidence of acute fractures of the lumbar spine, pelvis, left hip or left knee. 2. Chronic mild wedge compression deformity of T12. 3. Aortic atherosclerosis. 4. Calcifications in the distal quadriceps tendon. Electronically Signed   By: Francis Quam M.D.   On: 08/16/2024 04:38   CT Head Wo Contrast Result Date: 08/16/2024 EXAM: CT HEAD WITHOUT CONTRAST 08/16/2024 03:58:38 AM TECHNIQUE: CT of the head was performed without the administration of intravenous contrast. Automated exposure control, iterative reconstruction, and/or weight based adjustment of the mA/kV was utilized to reduce the radiation dose to as low as reasonably achievable. COMPARISON: 12/18/2023. CLINICAL HISTORY: Head trauma, minor (Age >= 65y). Fall FINDINGS: BRAIN AND VENTRICLES: No acute hemorrhage. Gray-white differentiation is preserved. No hydrocephalus. No  extra-axial collection. No mass effect or midline shift. Age-related volume loss and extensive cerebral white matter disease. ORBITS: The patient is status post bilateral lens replacement. SINUSES: No acute abnormality. SOFT TISSUES AND SKULL: No acute soft tissue abnormality. No skull fracture. IMPRESSION: 1. No acute intracranial abnormality. 2. Age-related volume loss and extensive cerebral white matter disease. Electronically signed by: Evalene Coho MD 08/16/2024 04:09 AM EDT RP Workstation: [id number]    Assessment/Plan HTN (hypertension) Blood pressure is controlled, continue Losartan , Metoprolol ,  takes ASA, Bun/creat 15/0.8 08/04/24  GERD (gastroesophageal reflux disease) Stable, continue omeprazole , Hgb 11.4 08/04/24  Hyperlipidemia off Atorvastatin , LDL 70 06/11/22  Prediabetes  diet controlled, Hgb a1c 5.8 08/11/23  Iron deficiency anemia Iron 37 06/30/24, on Fe, Hgb 11.4 08/04/24  Anxiety Longstanding problem, getting worse due to increased frailty, memory lapses, continue  Lorazepam  prn available to her.  Allergic to as SSRI, failed Cymbalta , BuSpar  Osteopenia after menopause declined Alendronate  after initial consented.  Taking calcium  and vitamin D  Atrial flutter (HCC)  01/05/22, no recurrence, delay cardiology consultation    Family/ staff Communication: Plan of care reviewed with the patient and charge nurse  Labs/tests ordered: None

## 2024-08-31 NOTE — Assessment & Plan Note (Signed)
 Longstanding problem, getting worse due to increased frailty, memory lapses, continue  Lorazepam  prn available to her.  Allergic to as SSRI, failed Cymbalta , BuSpar

## 2024-08-31 NOTE — Assessment & Plan Note (Signed)
 01/05/22, no recurrence, delay cardiology consultation

## 2024-08-31 NOTE — Assessment & Plan Note (Signed)
 declined Alendronate  after initial consented.  Taking calcium  and vitamin D

## 2024-08-31 NOTE — Assessment & Plan Note (Signed)
off Atorvastatin, LDL 70 06/11/22 

## 2024-08-31 NOTE — Assessment & Plan Note (Signed)
 diet controlled, Hgb a1c 5.8 08/11/23

## 2024-08-31 NOTE — Assessment & Plan Note (Signed)
 Blood pressure is controlled, continue Losartan , Metoprolol ,  takes ASA, Bun/creat 15/0.8 08/04/24

## 2024-08-31 NOTE — Assessment & Plan Note (Signed)
 Stable, continue omeprazole , Hgb 11.4 08/04/24

## 2024-09-02 ENCOUNTER — Encounter: Payer: Self-pay | Admitting: Nurse Practitioner

## 2024-09-02 ENCOUNTER — Emergency Department (HOSPITAL_COMMUNITY)
Admission: EM | Admit: 2024-09-02 | Discharge: 2024-09-02 | Disposition: A | Discharge status: Skilled Nursing Facility | Attending: Emergency Medicine | Admitting: Emergency Medicine

## 2024-09-02 ENCOUNTER — Non-Acute Institutional Stay (SKILLED_NURSING_FACILITY): Payer: Self-pay | Admitting: Nurse Practitioner

## 2024-09-02 ENCOUNTER — Emergency Department (HOSPITAL_COMMUNITY)

## 2024-09-02 ENCOUNTER — Other Ambulatory Visit: Payer: Self-pay

## 2024-09-02 DIAGNOSIS — W1830XA Fall on same level, unspecified, initial encounter: Secondary | ICD-10-CM | POA: Diagnosis not present

## 2024-09-02 DIAGNOSIS — G4733 Obstructive sleep apnea (adult) (pediatric): Secondary | ICD-10-CM

## 2024-09-02 DIAGNOSIS — F5105 Insomnia due to other mental disorder: Secondary | ICD-10-CM

## 2024-09-02 DIAGNOSIS — M25552 Pain in left hip: Secondary | ICD-10-CM | POA: Diagnosis not present

## 2024-09-02 DIAGNOSIS — M8580 Other specified disorders of bone density and structure, unspecified site: Secondary | ICD-10-CM | POA: Diagnosis not present

## 2024-09-02 DIAGNOSIS — R269 Unspecified abnormalities of gait and mobility: Secondary | ICD-10-CM

## 2024-09-02 DIAGNOSIS — M25562 Pain in left knee: Secondary | ICD-10-CM | POA: Diagnosis not present

## 2024-09-02 DIAGNOSIS — D509 Iron deficiency anemia, unspecified: Secondary | ICD-10-CM

## 2024-09-02 DIAGNOSIS — I4892 Unspecified atrial flutter: Secondary | ICD-10-CM

## 2024-09-02 DIAGNOSIS — R609 Edema, unspecified: Secondary | ICD-10-CM | POA: Diagnosis not present

## 2024-09-02 DIAGNOSIS — R519 Headache, unspecified: Secondary | ICD-10-CM | POA: Diagnosis not present

## 2024-09-02 DIAGNOSIS — F419 Anxiety disorder, unspecified: Secondary | ICD-10-CM

## 2024-09-02 DIAGNOSIS — R918 Other nonspecific abnormal finding of lung field: Secondary | ICD-10-CM | POA: Diagnosis not present

## 2024-09-02 DIAGNOSIS — S0990XA Unspecified injury of head, initial encounter: Secondary | ICD-10-CM | POA: Diagnosis not present

## 2024-09-02 DIAGNOSIS — W19XXXA Unspecified fall, initial encounter: Secondary | ICD-10-CM | POA: Diagnosis not present

## 2024-09-02 DIAGNOSIS — Z7982 Long term (current) use of aspirin: Secondary | ICD-10-CM | POA: Diagnosis not present

## 2024-09-02 DIAGNOSIS — I10 Essential (primary) hypertension: Secondary | ICD-10-CM

## 2024-09-02 DIAGNOSIS — R102 Pelvic and perineal pain: Secondary | ICD-10-CM | POA: Diagnosis not present

## 2024-09-02 DIAGNOSIS — Z043 Encounter for examination and observation following other accident: Secondary | ICD-10-CM | POA: Diagnosis not present

## 2024-09-02 DIAGNOSIS — S199XXA Unspecified injury of neck, initial encounter: Secondary | ICD-10-CM | POA: Diagnosis not present

## 2024-09-02 DIAGNOSIS — K219 Gastro-esophageal reflux disease without esophagitis: Secondary | ICD-10-CM

## 2024-09-02 DIAGNOSIS — Z780 Asymptomatic menopausal state: Secondary | ICD-10-CM

## 2024-09-02 NOTE — ED Notes (Signed)
 [facility] at bedside to take patient back to facility. Report given to PTAR.

## 2024-09-02 NOTE — Progress Notes (Signed)
 Location:   [facility] Room Number: 820 Place of Service:  ALF (13) Provider: Larwance Collen Vincent NP  Madeliene Tejera X, NP  Patient Care Team: Anaisabel Pederson X, NP as PCP - General (Internal Medicine) Jazzalyn Loewenstein X, NP as Nurse Practitioner (Internal Medicine)  Extended Emergency Contact Information Primary Emergency Contact: [facility] Address: [address]          [place], [place] [address] [place] Home Phone: [phone] Mobile Phone: [phone] Relation: Son  Code Status: DNR Goals of care: Advanced Directive information    09/02/2024    1:47 AM  Advanced Directives  Does Patient Have a Medical Advance Directive? Yes  Type of Estate agent of [place];Living will;Out of facility DNR (pink MOST or yellow form)     Chief Complaint  Patient presents with   Acute Visit    F/u ED eval for fall    HPI:  Pt is a 89 y.o. female seen today for an acute visit for follow up ED eval for fall.  The patient was noted lying on left side with head at the top of her night stand and her legs towards the headboard, resulted left back bruise. Unremarkable CT head/cervical spine, Xray hip/knee, except CXR showed bibasilar atelectasis or infiltrates. No respiratory symptoms.      Intermittent confusion, seems better since off BuSpar, duloxetine , Lexapro.             Gait abnormality, ambulated with walker              Hx of near syncope: not new, no orthostatic hypotension, last episode of dizziness 12/10/23. 12/17/24 CT head: No acute intracranial pathology. 2. Advanced white matter disease, most likely related to chronic microangiopathy, similar to prior CT.             Edema BLE, not apparent, TED             OSA, CPAP, seeing pulmonology.              A-flutter 01/05/22, no recurrence, delay cardiology consultation OP declined Alendronate  after initial consented.  Taking calcium  and vitamin D             Bone spur R foot, callus of foot, f/u podiatrist.               Allergic rhinitis, off Fluticasone nasal spray, Claritin , Atrovent  nasal spray.              Anxiety, Lorazepam  prn available to her.  Allergic to as SSRI, failed Cymbalta , BuSpar             Elevated TSH 5.07 06/11/22, TSH 5.11 02/10/23, TSH 3.49 08/11/23,  not on supplement.             HTN, taking Losartan , Metoprolol ,  takes ASA, Bun/creat 15/0.8 08/04/24             GERD, stable on Omeprazole , Hgb 11.4 08/04/24             Hyperlipidemia, off Atorvastatin , LDL 70 06/11/22             Prediabetes, diet controlled, Hgb a1c 5.8 08/11/23             Anemia, Iron 37 06/30/24, on Fe, Hgb 11.4 08/04/24   Past Medical History:  Diagnosis Date   GERD (gastroesophageal reflux disease)    Hx of cataract surgery    both eyes   Hyperglycemia    Hyperlipidemia  Hypertension    Lumbago 04/05/2016   Macular degeneration    ? which eye(s)   Obstructive sleep apnea    Osteoporosis, senile    Overactive bladder    Squamous cell skin cancer 2/16   left lower leg   Past Surgical History:  Procedure Laterality Date   ABDOMINAL HYSTERECTOMY  1960's   APPENDECTOMY  1960's   MOLE REMOVAL  2000   facial   TONSILLECTOMY      Allergies  Allergen Reactions   Adhesive [Tape]    Ampicillin    Elemental Sulfur    Nitrofuran Derivatives    Zoloft  [Sertraline  Hcl] Other (See Comments)    Dizziness, nausea, faint feeling, and nightmares      Allergies as of 09/02/2024       Reactions   Adhesive [tape]    Ampicillin    Elemental Sulfur    Nitrofuran Derivatives    Zoloft  [sertraline  Hcl] Other (See Comments)   Dizziness, nausea, faint feeling, and nightmares          Medication List        Accurate as of September 02, 2024 11:59 PM. If you have any questions, ask your nurse or doctor.          acetaminophen  500 MG tablet Commonly known as: TYLENOL  Take 500 mg by mouth daily.   acetaminophen  325 MG tablet Commonly known as: TYLENOL  Take 650 mg by mouth every 4 (four) hours as  needed. 2 tablets by mouth every 4 hours as need for fever and pain   aspirin  EC 81 MG tablet Take 1 tablet (81 mg total) by mouth daily.   Calcium  Carb-Cholecalciferol 600-20 MG-MCG Tabs Take 1 tablet by mouth 2 (two) times daily.   ferrous sulfate  325 (65 FE) MG EC tablet Take 325 mg by mouth 3 (three) times a week. Monday, Wednesday, and Friday.   loperamide 2 MG tablet Commonly known as: IMODIUM A-D Take 2 mg by mouth every 6 (six) hours as needed for diarrhea or loose stools.   LORazepam  0.5 MG tablet Commonly known as: ATIVAN  TAKE ONE TABLET ONCE DAILY AS NEEDED FOR ANXIETY.   losartan  100 MG tablet Commonly known as: COZAAR  Take 100 mg by mouth daily.   metoprolol  succinate 25 MG 24 hr tablet Commonly known as: TOPROL -XL TAKE 1/2 TABLET ONCE A DAY.   Ocuvite Adult 50+ Caps Take 1 capsule by mouth every evening.   omeprazole  40 MG capsule Commonly known as: PRILOSEC TAKE 1 CAPSULE DAILY 1/2 HOUR BEFORE BREAKFAST FOR STOMACH ACID.   Systane 0.4-0.3 % Gel ophthalmic gel Generic drug: Polyethyl Glycol-Propyl Glycol Place 1 application into both eyes daily.   TRIAMCINOLONE ACETONIDE(NASAL) NA Place 2 sprays into the nose daily. Each nostril        Review of Systems  Immunization History  Administered Date(s) Administered   Fluad Quad(high Dose 65+) 10/12/2019, 10/14/2022   INFLUENZA, HIGH DOSE SEASONAL PF 10/07/2017, 09/28/2021   Influenza Whole 09/30/2018   Influenza-Unspecified 09/27/2015, 10/09/2016, 10/10/2020, 09/30/2023   Moderna Covid-19 Fall Seasonal Vaccine 47yrs & older 10/14/2023   Moderna Covid-19 Vaccine Bivalent Booster 72yrs & up 10/30/2022   Moderna SARS-COV2 Booster Vaccination 10/14/2023   Moderna Sars-Covid-2 Vaccination 01/02/2020, 01/30/2020, 11/06/2020, 05/28/2021, 05/16/2022   PFIZER(Purple Top)SARS-COV-2 Vaccination 09/17/2021   Pfizer Fall 2024 Covid-19 Vaccine 6mos thru 55yrs 05/16/2022   Pneumococcal Conjugate-13 01/23/2015    Pneumococcal Polysaccharide-23 10/20/2007   Respiratory Syncytial Virus Vaccine,Recomb Aduvanted(Arexvy) 12/26/2022   Td 12/14/2012   Tdap 11/05/2023  Pertinent  Health Maintenance Due  Topic Date Due   Influenza Vaccine  09/28/2024 (Originally 07/29/2024)   DEXA SCAN  Completed      03/05/2023    2:48 PM 03/12/2023    3:32 PM 04/23/2023    1:49 PM 05/20/2023    2:12 PM 08/14/2023   12:41 PM  Fall Risk  Falls in the past year? 0 0 0 0 0  Was there an injury with Fall? 0 0 0 0 0  Fall Risk Category Calculator 0 0 0 0 0  Patient at Risk for Falls Due to No Fall Risks Impaired balance/gait;Impaired mobility History of fall(s) History of fall(s)   Fall risk Follow up Falls evaluation completed Falls evaluation completed Falls evaluation completed Falls evaluation completed Falls evaluation completed   Functional Status Survey:    Vitals:   09/02/24 1224  BP: 120/72  Pulse: 78  Resp: 16  Temp: (!) 97.4 F (36.3 C)  SpO2: 96%  Weight: 163 lb 3.2 oz (74 kg)   Body mass index is 25.56 kg/m. Physical Exam Vitals and nursing note reviewed.  Constitutional:      Appearance: Normal appearance.  HENT:     Head: Normocephalic and atraumatic.     Mouth/Throat:     Mouth: Mucous membranes are moist.  Eyes:     Extraocular Movements: Extraocular movements intact.     Conjunctiva/sclera: Conjunctivae normal.     Pupils: Pupils are equal, round, and reactive to light.  Cardiovascular:     Rate and Rhythm: Normal rate and regular rhythm.     Heart sounds: No murmur heard. Pulmonary:     Effort: Pulmonary effort is normal.     Breath sounds: Normal breath sounds. No rales.  Abdominal:     General: Bowel sounds are normal.     Palpations: Abdomen is soft.     Tenderness: There is no abdominal tenderness.  Musculoskeletal:        General: No tenderness.     Cervical back: Normal range of motion and neck supple.     Right lower leg: No edema.     Left lower leg: No edema.      Comments: Chronic neck,  middle back, and lower back pain.  No spine no spinous process tenderness palpated  Skin:    General: Skin is warm and dry.     Findings: No rash.  Neurological:     General: No focal deficit present.     Mental Status: She is alert and oriented to person, place, and time. Mental status is at baseline.     Gait: Gait abnormal.  Psychiatric:        Mood and Affect: Mood normal.        Behavior: Behavior normal.        Thought Content: Thought content normal.     Labs reviewed: Recent Labs    04/07/24 0000 07/09/24 0848 08/04/24 0000  NA 139 138 138  K 3.8 4.1 3.8  CL 105 107 106  CO2 25* 19 29*  BUN 21 18 15   CREATININE 1.0 0.8 0.8  CALCIUM  9.3 10.0 9.4   Recent Labs    03/29/24 0000 04/07/24 0000 07/09/24 0848 08/04/24 0000  AST 19 21 23 18   ALT 13 15 14 12   ALKPHOS 73 83 72 66  ALBUMIN 4.1  --  4.4 3.8   Recent Labs    06/30/24 0000 07/09/24 0848 08/04/24 0000  WBC 7.5 6.8 6.0  NEUTROABS 4,508.00  4,162.00 3,390.00  HGB 11.9* 11.8* 11.4*  HCT 37 36 36  PLT 212 193 157   Lab Results  Component Value Date   TSH 3.49 08/11/2023   Lab Results  Component Value Date   HGBA1C 5.8 08/11/2023   Lab Results  Component Value Date   CHOL 139 06/11/2022   HDL 56 06/11/2022   LDLCALC 70 06/11/2022   TRIG 57 06/11/2022   CHOLHDL 2.5 06/11/2022    Significant Diagnostic Results in last 30 days:  CT Head Wo Contrast Result Date: 09/02/2024 EXAM: CT HEAD AND CERVICAL SPINE 09/02/2024 03:00:11 AM TECHNIQUE: CT of the head and cervical spine was performed without the administration of intravenous contrast. Multiplanar reformatted images are provided for review. Automated exposure control, iterative reconstruction, and/or weight based adjustment of the mA/kV was utilized to reduce the radiation dose to as low as reasonably achievable. COMPARISON: None available. CLINICAL HISTORY: Polytrauma, blunt. c/o of repeated falls, 4 in the past month, ;  Declines blood thinners. Pt did hit her head, denies LOC or dizziness, swelling to left knee noted. FINDINGS: CT HEAD BRAIN AND VENTRICLES: No acute intracranial hemorrhage. No mass effect or midline shift. No abnormal extra-axial fluid collection. No evidence of acute infarct. No hydrocephalus. Confluent chronic ischemic white matter changes. ORBITS: No acute abnormality. SINUSES AND MASTOIDS: No acute abnormality. SOFT TISSUES AND SKULL: No acute skull fracture. No acute soft tissue abnormality. CT CERVICAL SPINE BONES AND ALIGNMENT: No acute fracture or traumatic malalignment. DEGENERATIVE CHANGES: Multilevel facet arthrosis. SOFT TISSUES: No prevertebral soft tissue swelling. IMPRESSION: 1. No acute intracranial abnormality. 2. No acute fracture or traumatic malalignment of the cervical spine. Electronically signed by: Franky Stanford MD 09/02/2024 03:28 AM EDT RP Workstation: [id number]   CT Cervical Spine Wo Contrast Result Date: 09/02/2024 EXAM: CT HEAD AND CERVICAL SPINE 09/02/2024 03:00:11 AM TECHNIQUE: CT of the head and cervical spine was performed without the administration of intravenous contrast. Multiplanar reformatted images are provided for review. Automated exposure control, iterative reconstruction, and/or weight based adjustment of the mA/kV was utilized to reduce the radiation dose to as low as reasonably achievable. COMPARISON: None available. CLINICAL HISTORY: Polytrauma, blunt. c/o of repeated falls, 4 in the past month, ; Declines blood thinners. Pt did hit her head, denies LOC or dizziness, swelling to left knee noted. FINDINGS: CT HEAD BRAIN AND VENTRICLES: No acute intracranial hemorrhage. No mass effect or midline shift. No abnormal extra-axial fluid collection. No evidence of acute infarct. No hydrocephalus. Confluent chronic ischemic white matter changes. ORBITS: No acute abnormality. SINUSES AND MASTOIDS: No acute abnormality. SOFT TISSUES AND SKULL: No acute skull fracture. No acute  soft tissue abnormality. CT CERVICAL SPINE BONES AND ALIGNMENT: No acute fracture or traumatic malalignment. DEGENERATIVE CHANGES: Multilevel facet arthrosis. SOFT TISSUES: No prevertebral soft tissue swelling. IMPRESSION: 1. No acute intracranial abnormality. 2. No acute fracture or traumatic malalignment of the cervical spine. Electronically signed by: Franky Stanford MD 09/02/2024 03:28 AM EDT RP Workstation: [id number]   DG Knee Complete 4 Views Left Result Date: 09/02/2024 CLINICAL DATA:  Fall, left knee pain EXAM: LEFT KNEE - COMPLETE 4+ VIEW COMPARISON:  None Available. FINDINGS: No evidence of fracture, dislocation, or joint effusion. No evidence of arthropathy or other focal bone abnormality. Soft tissues are unremarkable. IMPRESSION: Negative. Electronically Signed   By: Dorethia Molt M.D.   On: 09/02/2024 02:34   DG HIP UNILAT WITH PELVIS 2-3 VIEWS LEFT Result Date: 09/02/2024 CLINICAL DATA:  Fall, pelvic pain EXAM: DG HIP (WITH  OR WITHOUT PELVIS) 2-3V LEFT COMPARISON:  None Available. FINDINGS: There is no evidence of hip fracture or dislocation. There is no evidence of arthropathy or other focal bone abnormality. IMPRESSION: Negative. Electronically Signed   By: Dorethia Molt M.D.   On: 09/02/2024 02:33   DG Chest Portable 1 View Result Date: 09/02/2024 CLINICAL DATA:  Fall EXAM: PORTABLE CHEST 1 VIEW COMPARISON:  04/02/2023 FINDINGS: Lungs volumes are small. Bibasilar atelectasis or infiltrate is present. No pneumothorax or pleural effusion. Cardiac size within normal limits given the degree of pulmonary hypoinflation. Pulmonary vascularity is normal. Osseous structures are age-appropriate. No acute bone abnormality. IMPRESSION: 1. Pulmonary hypoinflation. Bibasilar atelectasis or infiltrate. Inferior Electronically Signed   By: Dorethia Molt M.D.   On: 09/02/2024 02:32   DG Thoracic Spine 2 View Result Date: 08/16/2024 EXAM: 2 VIEW(S) XRAY OF THE THORACIC SPINE 08/16/2024 06:12:00 AM  COMPARISON: CT of the thoracic spine dated 06/05/2023. CLINICAL HISTORY: Fall. FINDINGS: BONES: No acute fracture. No aggressive appearing osseous lesion. Alignment is normal. DISCS AND DEGENERATIVE CHANGES: Moderate chronic degenerative disc disease at T11-12 with disc space narrowing and sclerosis of the endplates anteriorly. SOFT TISSUES: The visualized lungs are clear. IMPRESSION: 1. No acute fracture. 2. Moderate chronic degenerative disc disease at T11-T12 with disc space narrowing and sclerosis of the endplates anteriorly. Electronically signed by: Evalene Coho MD 08/16/2024 06:21 AM EDT RP Workstation: [id number]   CT Cervical Spine Wo Contrast Result Date: 08/16/2024 EXAM: CT CERVICAL SPINE WITHOUT CONTRAST 08/16/2024 05:48:24 AM TECHNIQUE: CT of the cervical spine was performed without the administration of intravenous contrast. Multiplanar reformatted images are provided for review. Automated exposure control, iterative reconstruction, and/or weight based adjustment of the mA/kV was utilized to reduce the radiation dose to as low as reasonably achievable. COMPARISON: CT of the cervical spine dated 12/18/2023. CLINICAL HISTORY: Neck trauma (Age >= 65y). Pt to ED via [facility] from [facility] facility. Pt had an unwitnessed fall, c/o right knee and hip pain and back pain. Pt does not take blood thinners. Pt states she did not hit her head and did not have LOC. FINDINGS: CERVICAL SPINE: BONES AND ALIGNMENT: No acute fracture or traumatic malalignment. DEGENERATIVE CHANGES: There is slight degenerative anterolisthesis at C3-4 with moderate bilateral facet arthrosis. There is also slight degenerative anterolisthesis at C4-5. At C5-6, there is disc space narrowing and diffuse posterior endplate ridging with moderate central spinal canal stenosis and moderate-to-severe bilateral neural foraminal stenosis. At C6-7 there is moderate central spinal canal stenosis and moderate bilateral  neural foraminal stenosis. There is moderate diffuse facet arthrosis throughout the cervical spine. SOFT TISSUES: No prevertebral soft tissue swelling. IMPRESSION: 1. No acute abnormality of the cervical spine related to the reported neck trauma. 2. Slight degenerative anterolisthesis at C3-4 and C4-5 with moderate bilateral facet arthrosis at C3-4. 3. Disc space narrowing and diffuse posterior endplate ridging at C5-6 with moderate central spinal canal stenosis and moderate-to-severe bilateral neural foraminal stenosis. 4. Moderate central spinal canal stenosis and moderate bilateral neural foraminal stenosis at C6-7. 5. Moderate diffuse facet arthrosis throughout the cervical spine. Electronically signed by: Evalene Coho MD 08/16/2024 05:57 AM EDT RP Workstation: [id number]   DG Hip Unilat W or Wo Pelvis 2-3 Views Left Result Date: 08/16/2024 CLINICAL DATA:  Fall injury with blunt polytrauma. EXAM: DG HIP (WITH OR WITHOUT PELVIS) 2-3V LEFT; LUMBAR SPINE - COMPLETE 4+ VIEW; LEFT KNEE - COMPLETE 4+ VIEW COMPARISON:  Lumbar spine CT 06/03/2021, CT pelvis 06/03/2021. No prior  left knee series. FINDINGS: Lumbar spine, four views with AP Lat, both obliques: There is mild-to-moderate dextrorotary lumbar scoliosis, apex L2. Lateral view again demonstrating minimal grade 1 degenerative retrolisthesis at T12-L1, L1-2 and L2-3. Osteopenia. There is mild chronic wedge compression deformity of T12. The lumbar vertebrae are normal in heights without evidence of acute fracture. There is multilevel bridging marginal osteophytosis to the left. The discs are degenerated and collapsed with vacuum phenomenon at L3-4 and L5-S1, disc space loss also at L1-2 and L2-3. There is facet hypertrophy from L1-2 down, greatest at the lowest 3 levels where it is greater on the right Both SI joints are patent. There is patchy calcific plaque in the abdominal aorta. Three views AP pelvis and AP and frog-leg left hip: There is no evidence of  pelvic fracture or diastasis. No evidence of left hip fracture or dislocation. Joint spaces are maintained. Osteopenia. There is no other focal bone abnormality. Pelvic phleboliths. Soft tissues are otherwise unremarkable. Left knee, four views: There is osteopenia. No evidence of fractures, dislocation, arthropathy or other focal bone abnormality. No joint effusion is seen. There R calcifications in the distal quadriceps tendon. Scattered phleboliths in the upper foreleg. Soft tissues are otherwise unremarkable. IMPRESSION: 1. Osteopenia and degenerative change without evidence of acute fractures of the lumbar spine, pelvis, left hip or left knee. 2. Chronic mild wedge compression deformity of T12. 3. Aortic atherosclerosis. 4. Calcifications in the distal quadriceps tendon. Electronically Signed   By: Francis Quam M.D.   On: 08/16/2024 04:38   DG Knee Complete 4 Views Left Result Date: 08/16/2024 CLINICAL DATA:  Fall injury with blunt polytrauma. EXAM: DG HIP (WITH OR WITHOUT PELVIS) 2-3V LEFT; LUMBAR SPINE - COMPLETE 4+ VIEW; LEFT KNEE - COMPLETE 4+ VIEW COMPARISON:  Lumbar spine CT 06/03/2021, CT pelvis 06/03/2021. No prior left knee series. FINDINGS: Lumbar spine, four views with AP Lat, both obliques: There is mild-to-moderate dextrorotary lumbar scoliosis, apex L2. Lateral view again demonstrating minimal grade 1 degenerative retrolisthesis at T12-L1, L1-2 and L2-3. Osteopenia. There is mild chronic wedge compression deformity of T12. The lumbar vertebrae are normal in heights without evidence of acute fracture. There is multilevel bridging marginal osteophytosis to the left. The discs are degenerated and collapsed with vacuum phenomenon at L3-4 and L5-S1, disc space loss also at L1-2 and L2-3. There is facet hypertrophy from L1-2 down, greatest at the lowest 3 levels where it is greater on the right Both SI joints are patent. There is patchy calcific plaque in the abdominal aorta. Three views AP pelvis  and AP and frog-leg left hip: There is no evidence of pelvic fracture or diastasis. No evidence of left hip fracture or dislocation. Joint spaces are maintained. Osteopenia. There is no other focal bone abnormality. Pelvic phleboliths. Soft tissues are otherwise unremarkable. Left knee, four views: There is osteopenia. No evidence of fractures, dislocation, arthropathy or other focal bone abnormality. No joint effusion is seen. There R calcifications in the distal quadriceps tendon. Scattered phleboliths in the upper foreleg. Soft tissues are otherwise unremarkable. IMPRESSION: 1. Osteopenia and degenerative change without evidence of acute fractures of the lumbar spine, pelvis, left hip or left knee. 2. Chronic mild wedge compression deformity of T12. 3. Aortic atherosclerosis. 4. Calcifications in the distal quadriceps tendon. Electronically Signed   By: Francis Quam M.D.   On: 08/16/2024 04:38   DG Lumbar Spine Complete Result Date: 08/16/2024 CLINICAL DATA:  Fall injury with blunt polytrauma. EXAM: DG HIP (WITH OR WITHOUT  PELVIS) 2-3V LEFT; LUMBAR SPINE - COMPLETE 4+ VIEW; LEFT KNEE - COMPLETE 4+ VIEW COMPARISON:  Lumbar spine CT 06/03/2021, CT pelvis 06/03/2021. No prior left knee series. FINDINGS: Lumbar spine, four views with AP Lat, both obliques: There is mild-to-moderate dextrorotary lumbar scoliosis, apex L2. Lateral view again demonstrating minimal grade 1 degenerative retrolisthesis at T12-L1, L1-2 and L2-3. Osteopenia. There is mild chronic wedge compression deformity of T12. The lumbar vertebrae are normal in heights without evidence of acute fracture. There is multilevel bridging marginal osteophytosis to the left. The discs are degenerated and collapsed with vacuum phenomenon at L3-4 and L5-S1, disc space loss also at L1-2 and L2-3. There is facet hypertrophy from L1-2 down, greatest at the lowest 3 levels where it is greater on the right Both SI joints are patent. There is patchy calcific  plaque in the abdominal aorta. Three views AP pelvis and AP and frog-leg left hip: There is no evidence of pelvic fracture or diastasis. No evidence of left hip fracture or dislocation. Joint spaces are maintained. Osteopenia. There is no other focal bone abnormality. Pelvic phleboliths. Soft tissues are otherwise unremarkable. Left knee, four views: There is osteopenia. No evidence of fractures, dislocation, arthropathy or other focal bone abnormality. No joint effusion is seen. There R calcifications in the distal quadriceps tendon. Scattered phleboliths in the upper foreleg. Soft tissues are otherwise unremarkable. IMPRESSION: 1. Osteopenia and degenerative change without evidence of acute fractures of the lumbar spine, pelvis, left hip or left knee. 2. Chronic mild wedge compression deformity of T12. 3. Aortic atherosclerosis. 4. Calcifications in the distal quadriceps tendon. Electronically Signed   By: Francis Quam M.D.   On: 08/16/2024 04:38   CT Head Wo Contrast Result Date: 08/16/2024 EXAM: CT HEAD WITHOUT CONTRAST 08/16/2024 03:58:38 AM TECHNIQUE: CT of the head was performed without the administration of intravenous contrast. Automated exposure control, iterative reconstruction, and/or weight based adjustment of the mA/kV was utilized to reduce the radiation dose to as low as reasonably achievable. COMPARISON: 12/18/2023. CLINICAL HISTORY: Head trauma, minor (Age >= 65y). Fall FINDINGS: BRAIN AND VENTRICLES: No acute hemorrhage. Gray-white differentiation is preserved. No hydrocephalus. No extra-axial collection. No mass effect or midline shift. Age-related volume loss and extensive cerebral white matter disease. ORBITS: The patient is status post bilateral lens replacement. SINUSES: No acute abnormality. SOFT TISSUES AND SKULL: No acute soft tissue abnormality. No skull fracture. IMPRESSION: 1. No acute intracranial abnormality. 2. Age-related volume loss and extensive cerebral white matter disease.  Electronically signed by: Evalene Coho MD 08/16/2024 04:09 AM EDT RP Workstation: [id number]    Assessment/Plan: Gait abnormality Needs walker for ambulation, increased frailty and lack of safety awareness are contributory to falling. Close supervision and assistance in SNF [facility] recommended for safety.   OSA (obstructive sleep apnea) CPAP, seeing pulmonology.   Atrial flutter (HCC) 01/05/22, no recurrence, delay cardiology consultation  Osteopenia after menopause declined Alendronate  after initial consented.  Taking calcium  and vitamin D  Insomnia secondary to anxiety Lorazepam  prn available to her.  Allergic to as SSRI, failed Cymbalta , BuSpar  HTN (hypertension) Blood pressure is controlled,  taking Losartan , Metoprolol ,  takes ASA, Bun/creat 15/0.8 08/04/24  GERD (gastroesophageal reflux disease) stable on Omeprazole , Hgb 11.4 08/04/24  Iron deficiency anemia Iron 37 06/30/24, on Fe, Hgb 11.4 08/04/24    Family/ staff Communication: plan of care reviewed with the patient and charge nurse.   Labs/tests ordered:  none

## 2024-09-02 NOTE — ED Provider Notes (Signed)
  EMERGENCY DEPARTMENT AT [facility] Provider Note   CSN: [id number] Arrival date & time: 09/02/24  [id number]     Patient presents with: Jean [name] is a 89 y.o. female.   The history is provided by the patient and the EMS personnel.  Fall This is a new problem. The current episode started less than 1 hour ago. The problem occurs constantly. The problem has been resolved. Pertinent negatives include no chest pain and no abdominal pain. Nothing aggravates the symptoms. Nothing relieves the symptoms. She has tried nothing for the symptoms. The treatment provided no relief.       Prior to Admission medications   Medication Sig Start Date End Date Taking? Authorizing Provider  acetaminophen  (TYLENOL ) 325 MG tablet Take 650 mg by mouth every 4 (four) hours as needed. 2 tablets by mouth every 4 hours as need for fever and pain    [provider]  acetaminophen  (TYLENOL ) 500 MG tablet Take 500 mg by mouth daily.     [provider]  aspirin  EC 81 MG tablet Take 1 tablet (81 mg total) by mouth daily. 07/07/17   Arnetha Heft, MD  Calcium  Carb-Cholecalciferol 600-20 MG-MCG TABS Take 1 tablet by mouth 2 (two) times daily.    [provider]  ferrous sulfate  325 (65 FE) MG EC tablet Take 325 mg by mouth 3 (three) times a week. Monday, Wednesday, and Friday.    [provider]  loperamide (IMODIUM A-D) 2 MG tablet Take 2 mg by mouth every 6 (six) hours as needed for diarrhea or loose stools.    [provider]  LORazepam  (ATIVAN ) 0.5 MG tablet TAKE ONE TABLET ONCE DAILY AS NEEDED FOR ANXIETY. 01/06/23   Mast, Man X, NP  losartan  (COZAAR ) 100 MG tablet Take 100 mg by mouth daily.    [provider]  metoprolol  succinate (TOPROL -XL) 25 MG 24 hr tablet TAKE 1/2 TABLET ONCE A DAY. 08/13/22   Mast, Man X, NP  Multiple Vitamins-Minerals (OCUVITE ADULT 50+) CAPS Take 1 capsule by mouth every evening.    [provider]   omeprazole  (PRILOSEC) 40 MG capsule TAKE 1 CAPSULE DAILY 1/2 HOUR BEFORE BREAKFAST FOR STOMACH ACID. 10/03/22   Mast, Man X, NP  Polyethyl Glycol-Propyl Glycol (SYSTANE) 0.4-0.3 % GEL ophthalmic gel Place 1 application into both eyes daily.    [provider]  TRIAMCINOLONE ACETONIDE,NASAL, NA Place 2 sprays into the nose daily. Each nostril    [provider]    Allergies: Adhesive [tape], Ampicillin, Elemental sulfur, Nitrofuran derivatives, and Zoloft  [sertraline  hcl]    Review of Systems  Constitutional:  Negative for fever.  Cardiovascular:  Negative for chest pain.  Gastrointestinal:  Negative for abdominal pain.  All other systems reviewed and are negative.   Updated Vital Signs BP (!) 130/58 (BP Location: Right Arm)   Pulse 63   Temp (!) 97.5 F (36.4 C) (Oral)   Resp 16   Ht 5' 7 (1.702 m)   Wt 74 kg   SpO2 100%   BMI 25.55 kg/m   Physical Exam Vitals and nursing note reviewed.  Constitutional:      General: She is not in acute distress.    Appearance: Normal appearance. She is well-developed.  HENT:     Head: Normocephalic and atraumatic.     Right Ear: Tympanic membrane normal.     Left Ear: Tympanic membrane normal.     Nose: Nose normal.  Eyes:  Pupils: Pupils are equal, round, and reactive to light.  Cardiovascular:     Rate and Rhythm: Normal rate and regular rhythm.     Pulses: Normal pulses.     Heart sounds: Normal heart sounds.  Pulmonary:     Effort: Pulmonary effort is normal. No respiratory distress.     Breath sounds: Normal breath sounds.  Abdominal:     General: Bowel sounds are normal. There is no distension.     Palpations: Abdomen is soft.     Tenderness: There is no abdominal tenderness. There is no guarding or rebound.  Musculoskeletal:        General: Normal range of motion.     Cervical back: Normal range of motion and neck supple. No tenderness.  Skin:    General: Skin is dry.     Capillary Refill: Capillary  refill takes less than 2 seconds.     Findings: No erythema or rash.  Neurological:     General: No focal deficit present.     Mental Status: She is alert.     Deep Tendon Reflexes: Reflexes normal.  Psychiatric:        Mood and Affect: Mood normal.     (all labs ordered are listed, but only abnormal results are displayed) Labs Reviewed - No data to display  EKG: None  Radiology: CT Head Wo Contrast Result Date: 09/02/2024 EXAM: CT HEAD AND CERVICAL SPINE 09/02/2024 03:00:11 AM TECHNIQUE: CT of the head and cervical spine was performed without the administration of intravenous contrast. Multiplanar reformatted images are provided for review. Automated exposure control, iterative reconstruction, and/or weight based adjustment of the mA/kV was utilized to reduce the radiation dose to as low as reasonably achievable. COMPARISON: None available. CLINICAL HISTORY: Polytrauma, blunt. c/o of repeated falls, 4 in the past month, ; Declines blood thinners. Pt did hit her head, denies LOC or dizziness, swelling to left knee noted. FINDINGS: CT HEAD BRAIN AND VENTRICLES: No acute intracranial hemorrhage. No mass effect or midline shift. No abnormal extra-axial fluid collection. No evidence of acute infarct. No hydrocephalus. Confluent chronic ischemic white matter changes. ORBITS: No acute abnormality. SINUSES AND MASTOIDS: No acute abnormality. SOFT TISSUES AND SKULL: No acute skull fracture. No acute soft tissue abnormality. CT CERVICAL SPINE BONES AND ALIGNMENT: No acute fracture or traumatic malalignment. DEGENERATIVE CHANGES: Multilevel facet arthrosis. SOFT TISSUES: No prevertebral soft tissue swelling. IMPRESSION: 1. No acute intracranial abnormality. 2. No acute fracture or traumatic malalignment of the cervical spine. Electronically signed by: Franky Stanford MD 09/02/2024 03:28 AM EDT RP Workstation: [id number]   CT Cervical Spine Wo Contrast Result Date: 09/02/2024 EXAM: CT HEAD AND CERVICAL SPINE  09/02/2024 03:00:11 AM TECHNIQUE: CT of the head and cervical spine was performed without the administration of intravenous contrast. Multiplanar reformatted images are provided for review. Automated exposure control, iterative reconstruction, and/or weight based adjustment of the mA/kV was utilized to reduce the radiation dose to as low as reasonably achievable. COMPARISON: None available. CLINICAL HISTORY: Polytrauma, blunt. c/o of repeated falls, 4 in the past month, ; Declines blood thinners. Pt did hit her head, denies LOC or dizziness, swelling to left knee noted. FINDINGS: CT HEAD BRAIN AND VENTRICLES: No acute intracranial hemorrhage. No mass effect or midline shift. No abnormal extra-axial fluid collection. No evidence of acute infarct. No hydrocephalus. Confluent chronic ischemic white matter changes. ORBITS: No acute abnormality. SINUSES AND MASTOIDS: No acute abnormality. SOFT TISSUES AND SKULL: No acute skull fracture. No acute soft tissue abnormality.  CT CERVICAL SPINE BONES AND ALIGNMENT: No acute fracture or traumatic malalignment. DEGENERATIVE CHANGES: Multilevel facet arthrosis. SOFT TISSUES: No prevertebral soft tissue swelling. IMPRESSION: 1. No acute intracranial abnormality. 2. No acute fracture or traumatic malalignment of the cervical spine. Electronically signed by: Franky Stanford MD 09/02/2024 03:28 AM EDT RP Workstation: [id number]   DG Knee Complete 4 Views Left Result Date: 09/02/2024 CLINICAL DATA:  Fall, left knee pain EXAM: LEFT KNEE - COMPLETE 4+ VIEW COMPARISON:  None Available. FINDINGS: No evidence of fracture, dislocation, or joint effusion. No evidence of arthropathy or other focal bone abnormality. Soft tissues are unremarkable. IMPRESSION: Negative. Electronically Signed   By: Dorethia Molt M.D.   On: 09/02/2024 02:34   DG HIP UNILAT WITH PELVIS 2-3 VIEWS LEFT Result Date: 09/02/2024 CLINICAL DATA:  Fall, pelvic pain EXAM: DG HIP (WITH OR WITHOUT PELVIS) 2-3V LEFT  COMPARISON:  None Available. FINDINGS: There is no evidence of hip fracture or dislocation. There is no evidence of arthropathy or other focal bone abnormality. IMPRESSION: Negative. Electronically Signed   By: Dorethia Molt M.D.   On: 09/02/2024 02:33   DG Chest Portable 1 View Result Date: 09/02/2024 CLINICAL DATA:  Fall EXAM: PORTABLE CHEST 1 VIEW COMPARISON:  04/02/2023 FINDINGS: Lungs volumes are small. Bibasilar atelectasis or infiltrate is present. No pneumothorax or pleural effusion. Cardiac size within normal limits given the degree of pulmonary hypoinflation. Pulmonary vascularity is normal. Osseous structures are age-appropriate. No acute bone abnormality. IMPRESSION: 1. Pulmonary hypoinflation. Bibasilar atelectasis or infiltrate. Inferior Electronically Signed   By: Dorethia Molt M.D.   On: 09/02/2024 02:32     Procedures   Medications Ordered in the ED - No data to display                                  Medical Decision Making Fall at nursing home   Amount and/or Complexity of Data Reviewed Independent Historian: EMS    Details: See above  External Data Reviewed: notes.    Details: Previous notes reviewed  Radiology: ordered and independent interpretation performed.    Details: Negative head CT by me   Risk Risk Details: No tenderness anywhere on exam.  Imaging is negative stable for discharge.       Final diagnoses:  None    No signs of systemic illness or infection. The patient is nontoxic-appearing on exam and vital signs are within normal limits.  I have reviewed the triage vital signs and the nursing notes. Pertinent labs & imaging results that were available during my care of the patient were reviewed by me and considered in my medical decision making (see chart for details). After history, exam, and medical workup I feel the patient has been appropriately medically screened and is safe for discharge home. Pertinent diagnoses were discussed with the patient.  Patient was given return precautions.      ED Discharge Orders     None          Avondre Richens, MD 09/02/24 0425

## 2024-09-02 NOTE — Assessment & Plan Note (Signed)
CPAP, seeing pulmonology.

## 2024-09-02 NOTE — ED Notes (Signed)
 [facility] called to arrange transport to [facility]. The patient has been moved to RM [address] in the yellow zone.  ETA is within 45 minutes.

## 2024-09-02 NOTE — Assessment & Plan Note (Signed)
 Needs walker for ambulation, increased frailty and lack of safety awareness are contributory to falling. Close supervision and assistance in SN[facility] recommended for safety.

## 2024-09-02 NOTE — Assessment & Plan Note (Signed)
 declined Alendronate  after initial consented.  Taking calcium  and vitamin D

## 2024-09-02 NOTE — Assessment & Plan Note (Signed)
 Iron 37 06/30/24, on Fe, Hgb 11.4 08/04/24

## 2024-09-02 NOTE — ED Triage Notes (Addendum)
 Pt BIB [facility] from [facility], c/o of repeated falls, 4 in the past month,  Declines blood thinners. Pt did hit her head, denies LOC or dizziness, swelling to left knee noted. A/O 4, GCS 15. Facility wanted to have her checked out.   144/88 HR 65 94% RA CBG 152

## 2024-09-02 NOTE — Assessment & Plan Note (Signed)
 Lorazepam  prn available to her.  Allergic to as SSRI, failed Cymbalta , BuSpar.

## 2024-09-02 NOTE — Assessment & Plan Note (Signed)
 Blood pressure is controlled, taking Losartan , Metoprolol ,  takes ASA, Bun/creat 15/0.8 08/04/24

## 2024-09-02 NOTE — Assessment & Plan Note (Signed)
 01/05/22, no recurrence, delay cardiology consultation

## 2024-09-02 NOTE — Assessment & Plan Note (Signed)
 stable on Omeprazole , Hgb 11.4 08/04/24

## 2024-09-07 ENCOUNTER — Non-Acute Institutional Stay: Payer: Self-pay | Admitting: Adult Health

## 2024-09-07 ENCOUNTER — Encounter: Payer: Self-pay | Admitting: Adult Health

## 2024-09-07 DIAGNOSIS — I10 Essential (primary) hypertension: Secondary | ICD-10-CM

## 2024-09-07 DIAGNOSIS — R509 Fever, unspecified: Secondary | ICD-10-CM | POA: Diagnosis not present

## 2024-09-07 DIAGNOSIS — R051 Acute cough: Secondary | ICD-10-CM | POA: Diagnosis not present

## 2024-09-07 DIAGNOSIS — K219 Gastro-esophageal reflux disease without esophagitis: Secondary | ICD-10-CM

## 2024-09-07 DIAGNOSIS — J069 Acute upper respiratory infection, unspecified: Secondary | ICD-10-CM

## 2024-09-07 DIAGNOSIS — R531 Weakness: Secondary | ICD-10-CM | POA: Diagnosis not present

## 2024-09-07 LAB — BASIC METABOLIC PANEL WITH GFR
BUN: 13 (ref 4–21)
CO2: 25 — AB (ref 13–22)
Chloride: 104 (ref 99–108)
Creatinine: 0.7 (ref 0.5–1.1)
Glucose: 82
Potassium: 3.9 meq/L (ref 3.5–5.1)
Sodium: 136 — AB (ref 137–147)

## 2024-09-07 LAB — COMPREHENSIVE METABOLIC PANEL WITH GFR
Calcium: 9 (ref 8.7–10.7)
eGFR: 76

## 2024-09-07 LAB — CBC: RBC: 3.56 — AB (ref 3.87–5.11)

## 2024-09-07 LAB — CBC AND DIFFERENTIAL
HCT: 34 — AB (ref 36–46)
Hemoglobin: 11.1 — AB (ref 12.0–16.0)
Platelets: 157 K/uL (ref 150–400)
WBC: 6.4

## 2024-09-07 NOTE — Progress Notes (Signed)
 Location:  [facility] Room Number: [id number] Place of Service:  ALF [phone]) Provider:  Medina-Vargas, Vallen Calabrese, DNP, FNP-BC  Patient Care Team: Mast, Man X, NP as PCP - General (Internal Medicine) Mast, Man X, NP as Nurse Practitioner (Internal Medicine)  Extended Emergency Contact Information Primary Emergency Contact: [facility] Address: [address]          [place], [place] [address] [place] Home Phone: [phone] Mobile Phone: [phone] Relation: Son  Code Status:  DNR  Goals of care: Advanced Directive information    09/07/2024    2:06 PM  Advanced Directives  Does Patient Have a Medical Advance Directive? Yes  Type of Estate agent of [place];Living will;Out of facility DNR (pink MOST or yellow form)  Does patient want to make changes to medical advance directive? No - Patient declined  Copy of Healthcare Power of Attorney in Chart? Yes - validated most recent copy scanned in chart (See row information)  Pre-existing out of facility DNR order (yellow form or pink MOST form) Pink MOST form placed in chart (order not valid for inpatient use);Yellow form placed in chart (order not valid for inpatient use)     Chief Complaint  Patient presents with   Fever    acute visit for fever and congestion    HPI:  Pt is a 89 y.o. female seen today for an acute visit for fever and congestion. She is a resident of [facility]. Staff nurse reported that she had runny nose yesterday. Today, she did not eat breakfast and lunch. She has cough and was started on Guaifenesin PRN. She has low-grade fever, T 99. She was seen in her room today. She would open her eyes and respond to verbal greetings and then close her eyes. She remained on her bed since morning. No shortness of breath.  She was tested for COVID-19 and Influenza A/B, which were all negative.  BP 120/72, takes  Metoprolol  succinate 12.5 mg daily and  Losartan  100 mg daily for hypertension  She takes Omeprazole  40 mg daily for GERD  Past Medical History:  Diagnosis Date   GERD (gastroesophageal reflux disease)    Hx of cataract surgery    both eyes   Hyperglycemia    Hyperlipidemia    Hypertension    Lumbago 04/05/2016   Macular degeneration    ? which eye(s)   Obstructive sleep apnea    Osteoporosis, senile    Overactive bladder    Squamous cell skin cancer 2/16   left lower leg   Past Surgical History:  Procedure Laterality Date   ABDOMINAL HYSTERECTOMY  1960's   APPENDECTOMY  1960's   MOLE REMOVAL  2000   facial   TONSILLECTOMY      Allergies  Allergen Reactions   Adhesive [Tape]    Ampicillin    Elemental Sulfur    Nitrofuran Derivatives    Zoloft  [Sertraline  Hcl] Other (See Comments)    Dizziness, nausea, faint feeling, and nightmares      Outpatient Encounter Medications as of 09/07/2024  Medication Sig   acetaminophen  (TYLENOL ) 325 MG tablet Take 650 mg by mouth every 4 (four) hours as needed. 2 tablets by mouth every 4 hours as need for fever and pain   acetaminophen  (TYLENOL ) 500 MG tablet Take 500 mg by mouth daily.    aspirin  EC 81 MG tablet Take 1 tablet (81 mg total) by mouth daily.   Calcium  Carb-Cholecalciferol 600-20 MG-MCG TABS  Take 1 tablet by mouth 2 (two) times daily.   ferrous sulfate  325 (65 FE) MG EC tablet Take 325 mg by mouth 3 (three) times a week. Monday, Wednesday, and Friday.   guaiFENesin-dextromethorphan (ROBITUSSIN DM) 100-10 MG/5ML syrup Take 10 mLs by mouth every 6 (six) hours as needed for cough. for 2 Days   loperamide (IMODIUM A-D) 2 MG tablet Take 2 mg by mouth every 6 (six) hours as needed for diarrhea or loose stools.   loratadine  (CLARITIN ) 10 MG tablet Take 10 mg by mouth daily. for allergy symptoms for 2 Days   LORazepam  (ATIVAN ) 0.5 MG tablet TAKE ONE TABLET ONCE DAILY AS NEEDED FOR ANXIETY.   losartan  (COZAAR ) 100 MG tablet Take 100 mg by mouth daily.   metoprolol   succinate (TOPROL -XL) 25 MG 24 hr tablet TAKE 1/2 TABLET ONCE A DAY.   Multiple Vitamins-Minerals (OCUVITE ADULT 50+) CAPS Take 1 capsule by mouth every evening.   omeprazole  (PRILOSEC) 40 MG capsule TAKE 1 CAPSULE DAILY 1/2 HOUR BEFORE BREAKFAST FOR STOMACH ACID.   Polyethyl Glycol-Propyl Glycol (SYSTANE) 0.4-0.3 % GEL ophthalmic gel Place 1 application into both eyes daily.   TRIAMCINOLONE ACETONIDE,NASAL, NA Place 2 sprays into the nose daily. Each nostril   No facility-administered encounter medications on file as of 09/07/2024.    Review of Systems  Constitutional:  Negative for appetite change, chills, fatigue and fever.  HENT:  Negative for congestion, hearing loss, rhinorrhea and sore throat.   Eyes: Negative.   Respiratory:  Positive for cough. Negative for shortness of breath and wheezing.   Cardiovascular:  Negative for chest pain, palpitations and leg swelling.  Gastrointestinal:  Negative for abdominal pain, constipation, diarrhea, nausea and vomiting.  Genitourinary:  Negative for dysuria.  Musculoskeletal:  Negative for arthralgias, back pain and myalgias.  Skin:  Negative for color change, rash and wound.  Neurological:  Negative for dizziness, weakness and headaches.  Psychiatric/Behavioral:  Negative for behavioral problems. The patient is not nervous/anxious.       Immunization History  Administered Date(s) Administered   Fluad Quad(high Dose 65+) 10/12/2019, 10/14/2022   INFLUENZA, HIGH DOSE SEASONAL PF 10/07/2017, 09/28/2021   Influenza Whole 09/30/2018   Influenza-Unspecified 09/27/2015, 10/09/2016, 10/10/2020, 09/30/2023   Moderna Covid-19 Fall Seasonal Vaccine 55yrs & older 10/14/2023   Moderna Covid-19 Vaccine Bivalent Booster 28yrs & up 10/30/2022   Moderna SARS-COV2 Booster Vaccination 10/14/2023   Moderna Sars-Covid-2 Vaccination 01/02/2020, 01/30/2020, 11/06/2020, 05/28/2021, 05/16/2022   PFIZER(Purple Top)SARS-COV-2 Vaccination 09/17/2021   Pfizer Fall  2024 Covid-19 Vaccine 6mos thru 39yrs 05/16/2022   Pneumococcal Conjugate-13 01/23/2015   Pneumococcal Polysaccharide-23 10/20/2007   Respiratory Syncytial Virus Vaccine,Recomb Aduvanted(Arexvy) 12/26/2022   Td 12/14/2012   Tdap 11/05/2023   Pertinent  Health Maintenance Due  Topic Date Due   Influenza Vaccine  09/28/2024 (Originally 07/29/2024)   DEXA SCAN  Completed      03/05/2023    2:48 PM 03/12/2023    3:32 PM 04/23/2023    1:49 PM 05/20/2023    2:12 PM 08/14/2023   12:41 PM  Fall Risk  Falls in the past year? 0 0 0 0 0  Was there an injury with Fall? 0 0 0 0 0  Fall Risk Category Calculator 0 0 0 0 0  Patient at Risk for Falls Due to No Fall Risks Impaired balance/gait;Impaired mobility History of fall(s) History of fall(s)   Fall risk Follow up Falls evaluation completed Falls evaluation completed Falls evaluation completed Falls evaluation completed Falls evaluation completed  Vitals:   09/07/24 1358  BP: 120/72  Pulse: 78  Resp: 16  Temp: 99 F (37.2 C)  SpO2: 97%  Weight: 163 lb 3.2 oz (74 kg)  Height: 5' 7 (1.702 m)   Body mass index is 25.56 kg/m.  Physical Exam Constitutional:      Appearance: Normal appearance.  HENT:     Head: Normocephalic and atraumatic.     Nose: Nose normal.     Mouth/Throat:     Mouth: Mucous membranes are moist.  Eyes:     Conjunctiva/sclera: Conjunctivae normal.  Cardiovascular:     Rate and Rhythm: Normal rate and regular rhythm.  Pulmonary:     Effort: Pulmonary effort is normal.     Breath sounds: Normal breath sounds.  Abdominal:     General: Bowel sounds are normal.     Palpations: Abdomen is soft.  Musculoskeletal:     Cervical back: Normal range of motion.  Skin:    General: Skin is warm and dry.  Neurological:     Mental Status: She is alert.  Psychiatric:        Mood and Affect: Mood normal.        Behavior: Behavior normal.        Labs reviewed: Recent Labs    04/07/24 0000 07/09/24 0848  08/04/24 0000  NA 139 138 138  K 3.8 4.1 3.8  CL 105 107 106  CO2 25* 19 29*  BUN 21 18 15   CREATININE 1.0 0.8 0.8  CALCIUM  9.3 10.0 9.4   Recent Labs    03/29/24 0000 04/07/24 0000 07/09/24 0848 08/04/24 0000  AST 19 21 23 18   ALT 13 15 14 12   ALKPHOS 73 83 72 66  ALBUMIN 4.1  --  4.4 3.8   Recent Labs    06/30/24 0000 07/09/24 0848 08/04/24 0000  WBC 7.5 6.8 6.0  NEUTROABS 4,508.00 4,162.00 3,390.00  HGB 11.9* 11.8* 11.4*  HCT 37 36 36  PLT 212 193 157   Lab Results  Component Value Date   TSH 3.49 08/11/2023   Lab Results  Component Value Date   HGBA1C 5.8 08/11/2023   Lab Results  Component Value Date   CHOL 139 06/11/2022   HDL 56 06/11/2022   LDLCALC 70 06/11/2022   TRIG 57 06/11/2022   CHOLHDL 2.5 06/11/2022    Significant Diagnostic Results in last 30 days:  CT Head Wo Contrast Result Date: 09/02/2024 EXAM: CT HEAD AND CERVICAL SPINE 09/02/2024 03:00:11 AM TECHNIQUE: CT of the head and cervical spine was performed without the administration of intravenous contrast. Multiplanar reformatted images are provided for review. Automated exposure control, iterative reconstruction, and/or weight based adjustment of the mA/kV was utilized to reduce the radiation dose to as low as reasonably achievable. COMPARISON: None available. CLINICAL HISTORY: Polytrauma, blunt. c/o of repeated falls, 4 in the past month, ; Declines blood thinners. Pt did hit her head, denies LOC or dizziness, swelling to left knee noted. FINDINGS: CT HEAD BRAIN AND VENTRICLES: No acute intracranial hemorrhage. No mass effect or midline shift. No abnormal extra-axial fluid collection. No evidence of acute infarct. No hydrocephalus. Confluent chronic ischemic white matter changes. ORBITS: No acute abnormality. SINUSES AND MASTOIDS: No acute abnormality. SOFT TISSUES AND SKULL: No acute skull fracture. No acute soft tissue abnormality. CT CERVICAL SPINE BONES AND ALIGNMENT: No acute fracture or  traumatic malalignment. DEGENERATIVE CHANGES: Multilevel facet arthrosis. SOFT TISSUES: No prevertebral soft tissue swelling. IMPRESSION: 1. No acute intracranial abnormality. 2.  No acute fracture or traumatic malalignment of the cervical spine. Electronically signed by: Franky Stanford MD 09/02/2024 03:28 AM EDT RP Workstation: [id number]   CT Cervical Spine Wo Contrast Result Date: 09/02/2024 EXAM: CT HEAD AND CERVICAL SPINE 09/02/2024 03:00:11 AM TECHNIQUE: CT of the head and cervical spine was performed without the administration of intravenous contrast. Multiplanar reformatted images are provided for review. Automated exposure control, iterative reconstruction, and/or weight based adjustment of the mA/kV was utilized to reduce the radiation dose to as low as reasonably achievable. COMPARISON: None available. CLINICAL HISTORY: Polytrauma, blunt. c/o of repeated falls, 4 in the past month, ; Declines blood thinners. Pt did hit her head, denies LOC or dizziness, swelling to left knee noted. FINDINGS: CT HEAD BRAIN AND VENTRICLES: No acute intracranial hemorrhage. No mass effect or midline shift. No abnormal extra-axial fluid collection. No evidence of acute infarct. No hydrocephalus. Confluent chronic ischemic white matter changes. ORBITS: No acute abnormality. SINUSES AND MASTOIDS: No acute abnormality. SOFT TISSUES AND SKULL: No acute skull fracture. No acute soft tissue abnormality. CT CERVICAL SPINE BONES AND ALIGNMENT: No acute fracture or traumatic malalignment. DEGENERATIVE CHANGES: Multilevel facet arthrosis. SOFT TISSUES: No prevertebral soft tissue swelling. IMPRESSION: 1. No acute intracranial abnormality. 2. No acute fracture or traumatic malalignment of the cervical spine. Electronically signed by: Franky Stanford MD 09/02/2024 03:28 AM EDT RP Workstation: [id number]   DG Knee Complete 4 Views Left Result Date: 09/02/2024 CLINICAL DATA:  Fall, left knee pain EXAM: LEFT KNEE - COMPLETE 4+ VIEW  COMPARISON:  None Available. FINDINGS: No evidence of fracture, dislocation, or joint effusion. No evidence of arthropathy or other focal bone abnormality. Soft tissues are unremarkable. IMPRESSION: Negative. Electronically Signed   By: Dorethia Molt M.D.   On: 09/02/2024 02:34   DG HIP UNILAT WITH PELVIS 2-3 VIEWS LEFT Result Date: 09/02/2024 CLINICAL DATA:  Fall, pelvic pain EXAM: DG HIP (WITH OR WITHOUT PELVIS) 2-3V LEFT COMPARISON:  None Available. FINDINGS: There is no evidence of hip fracture or dislocation. There is no evidence of arthropathy or other focal bone abnormality. IMPRESSION: Negative. Electronically Signed   By: Dorethia Molt M.D.   On: 09/02/2024 02:33   DG Chest Portable 1 View Result Date: 09/02/2024 CLINICAL DATA:  Fall EXAM: PORTABLE CHEST 1 VIEW COMPARISON:  04/02/2023 FINDINGS: Lungs volumes are small. Bibasilar atelectasis or infiltrate is present. No pneumothorax or pleural effusion. Cardiac size within normal limits given the degree of pulmonary hypoinflation. Pulmonary vascularity is normal. Osseous structures are age-appropriate. No acute bone abnormality. IMPRESSION: 1. Pulmonary hypoinflation. Bibasilar atelectasis or infiltrate. Inferior Electronically Signed   By: Dorethia Molt M.D.   On: 09/02/2024 02:32   DG Thoracic Spine 2 View Result Date: 08/16/2024 EXAM: 2 VIEW(S) XRAY OF THE THORACIC SPINE 08/16/2024 06:12:00 AM COMPARISON: CT of the thoracic spine dated 06/05/2023. CLINICAL HISTORY: Fall. FINDINGS: BONES: No acute fracture. No aggressive appearing osseous lesion. Alignment is normal. DISCS AND DEGENERATIVE CHANGES: Moderate chronic degenerative disc disease at T11-12 with disc space narrowing and sclerosis of the endplates anteriorly. SOFT TISSUES: The visualized lungs are clear. IMPRESSION: 1. No acute fracture. 2. Moderate chronic degenerative disc disease at T11-T12 with disc space narrowing and sclerosis of the endplates anteriorly. Electronically signed by:  Evalene Coho MD 08/16/2024 06:21 AM EDT RP Workstation: [id number]   CT Cervical Spine Wo Contrast Result Date: 08/16/2024 EXAM: CT CERVICAL SPINE WITHOUT CONTRAST 08/16/2024 05:48:24 AM TECHNIQUE: CT of the cervical spine was performed without the administration of intravenous contrast. Multiplanar  reformatted images are provided for review. Automated exposure control, iterative reconstruction, and/or weight based adjustment of the mA/kV was utilized to reduce the radiation dose to as low as reasonably achievable. COMPARISON: CT of the cervical spine dated 12/18/2023. CLINICAL HISTORY: Neck trauma (Age >= 65y). Pt to ED via [facility] from [facility]. Pt had an unwitnessed fall, c/o right knee and hip pain and back pain. Pt does not take blood thinners. Pt states she did not hit her head and did not have LOC. FINDINGS: CERVICAL SPINE: BONES AND ALIGNMENT: No acute fracture or traumatic malalignment. DEGENERATIVE CHANGES: There is slight degenerative anterolisthesis at C3-4 with moderate bilateral facet arthrosis. There is also slight degenerative anterolisthesis at C4-5. At C5-6, there is disc space narrowing and diffuse posterior endplate ridging with moderate central spinal canal stenosis and moderate-to-severe bilateral neural foraminal stenosis. At C6-7 there is moderate central spinal canal stenosis and moderate bilateral neural foraminal stenosis. There is moderate diffuse facet arthrosis throughout the cervical spine. SOFT TISSUES: No prevertebral soft tissue swelling. IMPRESSION: 1. No acute abnormality of the cervical spine related to the reported neck trauma. 2. Slight degenerative anterolisthesis at C3-4 and C4-5 with moderate bilateral facet arthrosis at C3-4. 3. Disc space narrowing and diffuse posterior endplate ridging at C5-6 with moderate central spinal canal stenosis and moderate-to-severe bilateral neural foraminal stenosis. 4. Moderate central spinal canal  stenosis and moderate bilateral neural foraminal stenosis at C6-7. 5. Moderate diffuse facet arthrosis throughout the cervical spine. Electronically signed by: Evalene Coho MD 08/16/2024 05:57 AM EDT RP Workstation: [id number]   DG Hip Unilat W or Wo Pelvis 2-3 Views Left Result Date: 08/16/2024 CLINICAL DATA:  Fall injury with blunt polytrauma. EXAM: DG HIP (WITH OR WITHOUT PELVIS) 2-3V LEFT; LUMBAR SPINE - COMPLETE 4+ VIEW; LEFT KNEE - COMPLETE 4+ VIEW COMPARISON:  Lumbar spine CT 06/03/2021, CT pelvis 06/03/2021. No prior left knee series. FINDINGS: Lumbar spine, four views with AP Lat, both obliques: There is mild-to-moderate dextrorotary lumbar scoliosis, apex L2. Lateral view again demonstrating minimal grade 1 degenerative retrolisthesis at T12-L1, L1-2 and L2-3. Osteopenia. There is mild chronic wedge compression deformity of T12. The lumbar vertebrae are normal in heights without evidence of acute fracture. There is multilevel bridging marginal osteophytosis to the left. The discs are degenerated and collapsed with vacuum phenomenon at L3-4 and L5-S1, disc space loss also at L1-2 and L2-3. There is facet hypertrophy from L1-2 down, greatest at the lowest 3 levels where it is greater on the right Both SI joints are patent. There is patchy calcific plaque in the abdominal aorta. Three views AP pelvis and AP and frog-leg left hip: There is no evidence of pelvic fracture or diastasis. No evidence of left hip fracture or dislocation. Joint spaces are maintained. Osteopenia. There is no other focal bone abnormality. Pelvic phleboliths. Soft tissues are otherwise unremarkable. Left knee, four views: There is osteopenia. No evidence of fractures, dislocation, arthropathy or other focal bone abnormality. No joint effusion is seen. There R calcifications in the distal quadriceps tendon. Scattered phleboliths in the upper foreleg. Soft tissues are otherwise unremarkable. IMPRESSION: 1. Osteopenia and  degenerative change without evidence of acute fractures of the lumbar spine, pelvis, left hip or left knee. 2. Chronic mild wedge compression deformity of T12. 3. Aortic atherosclerosis. 4. Calcifications in the distal quadriceps tendon. Electronically Signed   By: Francis Quam M.D.   On: 08/16/2024 04:38   DG Knee Complete 4 Views Left Result Date: 08/16/2024  CLINICAL DATA:  Fall injury with blunt polytrauma. EXAM: DG HIP (WITH OR WITHOUT PELVIS) 2-3V LEFT; LUMBAR SPINE - COMPLETE 4+ VIEW; LEFT KNEE - COMPLETE 4+ VIEW COMPARISON:  Lumbar spine CT 06/03/2021, CT pelvis 06/03/2021. No prior left knee series. FINDINGS: Lumbar spine, four views with AP Lat, both obliques: There is mild-to-moderate dextrorotary lumbar scoliosis, apex L2. Lateral view again demonstrating minimal grade 1 degenerative retrolisthesis at T12-L1, L1-2 and L2-3. Osteopenia. There is mild chronic wedge compression deformity of T12. The lumbar vertebrae are normal in heights without evidence of acute fracture. There is multilevel bridging marginal osteophytosis to the left. The discs are degenerated and collapsed with vacuum phenomenon at L3-4 and L5-S1, disc space loss also at L1-2 and L2-3. There is facet hypertrophy from L1-2 down, greatest at the lowest 3 levels where it is greater on the right Both SI joints are patent. There is patchy calcific plaque in the abdominal aorta. Three views AP pelvis and AP and frog-leg left hip: There is no evidence of pelvic fracture or diastasis. No evidence of left hip fracture or dislocation. Joint spaces are maintained. Osteopenia. There is no other focal bone abnormality. Pelvic phleboliths. Soft tissues are otherwise unremarkable. Left knee, four views: There is osteopenia. No evidence of fractures, dislocation, arthropathy or other focal bone abnormality. No joint effusion is seen. There R calcifications in the distal quadriceps tendon. Scattered phleboliths in the upper foreleg. Soft tissues are  otherwise unremarkable. IMPRESSION: 1. Osteopenia and degenerative change without evidence of acute fractures of the lumbar spine, pelvis, left hip or left knee. 2. Chronic mild wedge compression deformity of T12. 3. Aortic atherosclerosis. 4. Calcifications in the distal quadriceps tendon. Electronically Signed   By: Francis Quam M.D.   On: 08/16/2024 04:38   DG Lumbar Spine Complete Result Date: 08/16/2024 CLINICAL DATA:  Fall injury with blunt polytrauma. EXAM: DG HIP (WITH OR WITHOUT PELVIS) 2-3V LEFT; LUMBAR SPINE - COMPLETE 4+ VIEW; LEFT KNEE - COMPLETE 4+ VIEW COMPARISON:  Lumbar spine CT 06/03/2021, CT pelvis 06/03/2021. No prior left knee series. FINDINGS: Lumbar spine, four views with AP Lat, both obliques: There is mild-to-moderate dextrorotary lumbar scoliosis, apex L2. Lateral view again demonstrating minimal grade 1 degenerative retrolisthesis at T12-L1, L1-2 and L2-3. Osteopenia. There is mild chronic wedge compression deformity of T12. The lumbar vertebrae are normal in heights without evidence of acute fracture. There is multilevel bridging marginal osteophytosis to the left. The discs are degenerated and collapsed with vacuum phenomenon at L3-4 and L5-S1, disc space loss also at L1-2 and L2-3. There is facet hypertrophy from L1-2 down, greatest at the lowest 3 levels where it is greater on the right Both SI joints are patent. There is patchy calcific plaque in the abdominal aorta. Three views AP pelvis and AP and frog-leg left hip: There is no evidence of pelvic fracture or diastasis. No evidence of left hip fracture or dislocation. Joint spaces are maintained. Osteopenia. There is no other focal bone abnormality. Pelvic phleboliths. Soft tissues are otherwise unremarkable. Left knee, four views: There is osteopenia. No evidence of fractures, dislocation, arthropathy or other focal bone abnormality. No joint effusion is seen. There R calcifications in the distal quadriceps tendon. Scattered  phleboliths in the upper foreleg. Soft tissues are otherwise unremarkable. IMPRESSION: 1. Osteopenia and degenerative change without evidence of acute fractures of the lumbar spine, pelvis, left hip or left knee. 2. Chronic mild wedge compression deformity of T12. 3. Aortic atherosclerosis. 4. Calcifications in the distal quadriceps  tendon. Electronically Signed   By: Francis Quam M.D.   On: 08/16/2024 04:38   CT Head Wo Contrast Result Date: 08/16/2024 EXAM: CT HEAD WITHOUT CONTRAST 08/16/2024 03:58:38 AM TECHNIQUE: CT of the head was performed without the administration of intravenous contrast. Automated exposure control, iterative reconstruction, and/or weight based adjustment of the mA/kV was utilized to reduce the radiation dose to as low as reasonably achievable. COMPARISON: 12/18/2023. CLINICAL HISTORY: Head trauma, minor (Age >= 65y). Fall FINDINGS: BRAIN AND VENTRICLES: No acute hemorrhage. Gray-white differentiation is preserved. No hydrocephalus. No extra-axial collection. No mass effect or midline shift. Age-related volume loss and extensive cerebral white matter disease. ORBITS: The patient is status post bilateral lens replacement. SINUSES: No acute abnormality. SOFT TISSUES AND SKULL: No acute soft tissue abnormality. No skull fracture. IMPRESSION: 1. No acute intracranial abnormality. 2. Age-related volume loss and extensive cerebral white matter disease. Electronically signed by: Evalene Coho MD 08/16/2024 04:09 AM EDT RP Workstation: [id number]    Assessment/Plan  1. URI with cough and congestion (Primary) - guaifenesin 100 mg / 5 mL give 10 mL every 8 hours x 5 days. -   COVID 19 test and influenza A/B test negative - Chest x-ray PA and lateral to rule out pneumonia  2. Primary hypertension -BP stable   - Continue metoprolol  succinate 12.5 mg daily -   Continue losartan  100 mg daily  3. Gastroesophageal reflux disease without esophagitis - Continue omeprazole  40 mg daily         Family/ staff Communication: Discussed plan of care with resident and charge nurse.  Labs/tests ordered: BMP, CBC, chest x-ray, COVID 19 test and influenza A and B test    Jereld Serum, DNP, MSN, FNP-BC [facility] [phone] (Monday-Friday 8:00 a.m. - 5:00 p.m.) [phone] (after hours)

## 2024-09-08 ENCOUNTER — Encounter: Payer: Self-pay | Admitting: Nurse Practitioner

## 2024-09-08 DIAGNOSIS — J189 Pneumonia, unspecified organism: Secondary | ICD-10-CM | POA: Insufficient documentation

## 2024-09-08 DIAGNOSIS — N390 Urinary tract infection, site not specified: Secondary | ICD-10-CM | POA: Diagnosis not present

## 2024-09-08 DIAGNOSIS — J90 Pleural effusion, not elsewhere classified: Secondary | ICD-10-CM | POA: Diagnosis not present

## 2024-09-08 DIAGNOSIS — J9809 Other diseases of bronchus, not elsewhere classified: Secondary | ICD-10-CM | POA: Diagnosis not present

## 2024-09-09 ENCOUNTER — Non-Acute Institutional Stay: Payer: Self-pay | Admitting: Nurse Practitioner

## 2024-09-09 ENCOUNTER — Encounter: Payer: Self-pay | Admitting: Nurse Practitioner

## 2024-09-09 DIAGNOSIS — I10 Essential (primary) hypertension: Secondary | ICD-10-CM | POA: Diagnosis not present

## 2024-09-09 DIAGNOSIS — F419 Anxiety disorder, unspecified: Secondary | ICD-10-CM

## 2024-09-09 NOTE — Assessment & Plan Note (Signed)
 Lorazepam  prn available to her.  Allergic to as SSRI, failed Cymbalta , BuSpar.

## 2024-09-09 NOTE — Assessment & Plan Note (Signed)
 Blood pressure is controlled, taking Losartan , Metoprolol ,  takes ASA, Bun/creat 15/0.8 08/04/24

## 2024-09-09 NOTE — Assessment & Plan Note (Signed)
 pneumonia, chest x-ray showed 09/08/2024 left pleural effusion associated with atelectasis or pneumonia.  The patient has no nasal congestion, some cough, no phlegm production, denied chest pain, shortness of breath, palpitation, or nausea/vomiting.  The patient is afebrile, no O2 desaturation, she does admit generalized malaise, her appetite is not optimal. Doxycycline 100 mg by mouth 2 times a day for 7 days

## 2024-09-09 NOTE — Progress Notes (Signed)
 [facility] Room [facility]: 820 Place of Service:  ALF (13) Provider: Larwance Autie Vasudevan NP  Karington Zarazua X, NP  Patient Care Team: Joel Cowin X, NP as PCP - General (Internal Medicine) Michiah Masse X, NP as Nurse Practitioner (Internal Medicine)  Extended Emergency Contact Information Primary Emergency Contact: [facility] Address: [address]          [place], [place] [address] [place] Home Phone: [phone] Mobile Phone: [phone] Relation: Son  Code Status: DNR Goals of care: Advanced Directive information    09/12/2024   11:18 AM  Advanced Directives  Does Patient Have a Medical Advance Directive? Yes  Type of Advance Directive Out of facility DNR (pink MOST or yellow form);Living will  Does patient want to make changes to medical advance directive? No - Patient declined  Copy of Healthcare Power of Attorney in Chart? Yes - validated most recent copy scanned in chart (See row information)     Chief Complaint  Patient presents with   Acute Visit    Pneumonia    HPI:  Pt is a 89 y.o. female seen today for an acute visit for pneumonia, chest x-ray showed 09/08/2024 left pleural effusion associated with atelectasis or pneumonia.  The patient has no nasal congestion, some cough, no phlegm production, denied chest pain, shortness of breath, palpitation, or nausea/vomiting.  The patient is afebrile, no O2 desaturation, she does admit generalized malaise, her appetite is not optimal.     The patient was noted lying on left side with head at the top of her night stand and her legs towards the headboard, resulted left back bruise. Unremarkable CT head/cervical spine, Xray hip/knee, except CXR showed bibasilar atelectasis or infiltrates. No respiratory symptoms.                           Intermittent confusion, seems better since off BuSpar, duloxetine , Lexapro.             Gait abnormality, ambulated with walker              Hx of near syncope: not new, no  orthostatic hypotension, last episode of dizziness 12/10/23. 12/17/24 CT head: No acute intracranial pathology. 2. Advanced white matter disease, most likely related to chronic microangiopathy, similar to prior CT.             Edema BLE, not apparent, TED             OSA, CPAP, seeing pulmonology.              A-flutter 01/05/22, no recurrence, delay cardiology consultation OP declined Alendronate  after initial consented.  Taking calcium  and vitamin D             Bone spur R foot, callus of foot, f/u podiatrist.              Allergic rhinitis, off Fluticasone nasal spray, Claritin , Atrovent  nasal spray.              Anxiety, Lorazepam  prn available to her.  Allergic to as SSRI, failed Cymbalta , BuSpar             Elevated TSH 5.07 06/11/22, TSH 5.11 02/10/23, TSH 3.49 08/11/23,  not on supplement.             HTN, taking Losartan , Metoprolol ,  takes ASA, Bun/creat 15/0.8 08/04/24             GERD, stable  on Omeprazole , Hgb 11.4 08/04/24             Hyperlipidemia, off Atorvastatin , LDL 70 06/11/22             Prediabetes, diet controlled, Hgb a1c 5.8 08/11/23             Anemia, Iron 37 06/30/24, on Fe, Hgb 11.4 08/04/24     Past Medical History:  Diagnosis Date   GERD (gastroesophageal reflux disease)    Hx of cataract surgery    both eyes   Hyperglycemia    Hyperlipidemia    Hypertension    Lumbago 04/05/2016   Macular degeneration    ? which eye(s)   Obstructive sleep apnea    Osteoporosis, senile    Overactive bladder    Squamous cell skin cancer 2/16   left lower leg   Past Surgical History:  Procedure Laterality Date   ABDOMINAL HYSTERECTOMY  1960's   APPENDECTOMY  1960's   MOLE REMOVAL  2000   facial   TONSILLECTOMY      Allergies  Allergen Reactions   Adhesive [Tape]    Ampicillin    Elemental Sulfur    Nitrofuran Derivatives    Zoloft  [Sertraline  Hcl] Other (See Comments)    Dizziness, nausea, faint feeling, and nightmares      Allergies as of 09/09/2024       Reactions    Adhesive [tape]    Ampicillin    Elemental Sulfur    Nitrofuran Derivatives    Zoloft  [sertraline  Hcl] Other (See Comments)   Dizziness, nausea, faint feeling, and nightmares          Medication List        Accurate as of September 09, 2024 11:59 PM. If you have any questions, ask your nurse or doctor.          acetaminophen  500 MG tablet Commonly known as: TYLENOL  Take 500 mg by mouth daily.   acetaminophen  325 MG tablet Commonly known as: TYLENOL  Take 650 mg by mouth every 4 (four) hours as needed. 2 tablets by mouth every 4 hours as need for fever and pain   aspirin  EC 81 MG tablet Take 1 tablet (81 mg total) by mouth daily.   Calcium  Carb-Cholecalciferol 600-20 MG-MCG Tabs Take 1 tablet by mouth 2 (two) times daily.   ferrous sulfate  325 (65 FE) MG EC tablet Take 325 mg by mouth 3 (three) times a week. Monday, Wednesday, and Friday.   loperamide 2 MG tablet Commonly known as: IMODIUM A-D Take 2 mg by mouth every 6 (six) hours as needed for diarrhea or loose stools.   LORazepam  0.5 MG tablet Commonly known as: ATIVAN  TAKE ONE TABLET ONCE DAILY AS NEEDED FOR ANXIETY.   losartan  100 MG tablet Commonly known as: COZAAR  Take 100 mg by mouth daily.   metoprolol  succinate 25 MG 24 hr tablet Commonly known as: TOPROL -XL TAKE 1/2 TABLET ONCE A DAY.   Ocuvite Adult 50+ Caps Take 1 capsule by mouth every evening.   omeprazole  40 MG capsule Commonly known as: PRILOSEC TAKE 1 CAPSULE DAILY 1/2 HOUR BEFORE BREAKFAST FOR STOMACH ACID.   Systane 0.4-0.3 % Gel ophthalmic gel Generic drug: Polyethyl Glycol-Propyl Glycol Place 1 application into both eyes daily.   TRIAMCINOLONE ACETONIDE(NASAL) NA Place 2 sprays into the nose daily. Each nostril        Review of Systems  Constitutional:  Positive for appetite change and fatigue. Negative for fever.  HENT:  Positive for congestion  and rhinorrhea. Negative for hearing loss, sore throat and trouble  swallowing.   Eyes:  Negative for visual disturbance.  Respiratory:  Positive for cough. Negative for shortness of breath.   Cardiovascular:  Negative for leg swelling.  Gastrointestinal:  Negative for abdominal pain and constipation.  Genitourinary:  Negative for dysuria and urgency.       Worsened from occasionally urinary leakage to frequent urinary incontinence even if more at night.   Musculoskeletal:  Positive for arthralgias, back pain, gait problem and neck pain.  Skin:  Negative for color change.  Neurological:  Negative for speech difficulty, weakness and light-headedness.       Tingling in toes sometimes, better after moves around.  Tingling and numbness in the right shin 04/06/24 resolved after rest and elevated leg.   Psychiatric/Behavioral:  Positive for confusion. Negative for sleep disturbance. The patient is nervous/anxious.        Intermittent     Immunization History  Administered Date(s) Administered   Fluad Quad(high Dose 65+) 10/12/2019, 10/14/2022   INFLUENZA, HIGH DOSE SEASONAL PF 10/07/2017, 09/28/2021   Influenza Whole 09/30/2018   Influenza-Unspecified 09/27/2015, 10/09/2016, 10/10/2020, 09/30/2023   Moderna Covid-19 Fall Seasonal Vaccine 54yrs & older 10/14/2023   Moderna Covid-19 Vaccine Bivalent Booster 63yrs & up 10/30/2022   Moderna SARS-COV2 Booster Vaccination 10/14/2023   Moderna Sars-Covid-2 Vaccination 01/02/2020, 01/30/2020, 11/06/2020, 05/28/2021, 05/16/2022   PFIZER(Purple Top)SARS-COV-2 Vaccination 09/17/2021   Pfizer Fall 2024 Covid-19 Vaccine 6mos thru 69yrs 05/16/2022   Pneumococcal Conjugate-13 01/23/2015   Pneumococcal Polysaccharide-23 10/20/2007   Respiratory Syncytial Virus Vaccine,Recomb Aduvanted(Arexvy) 12/26/2022   Td 12/14/2012   Tdap 11/05/2023   Pertinent  Health Maintenance Due  Topic Date Due   Influenza Vaccine  09/28/2024 (Originally 07/29/2024)   DEXA SCAN  Completed      03/05/2023    2:48 PM 03/12/2023    3:32 PM  04/23/2023    1:49 PM 05/20/2023    2:12 PM 08/14/2023   12:41 PM  Fall Risk  Falls in the past year? 0 0 0 0 0  Was there an injury with Fall? 0 0 0 0 0  Fall Risk Category Calculator 0 0 0 0 0  Patient at Risk for Falls Due to No Fall Risks Impaired balance/gait;Impaired mobility History of fall(s) History of fall(s)   Fall risk Follow up Falls evaluation completed Falls evaluation completed Falls evaluation completed Falls evaluation completed Falls evaluation completed   Functional Status Survey:    Vitals:   09/09/24 1153  BP: (!) 142/72  Pulse: 92  Resp: 18  Temp: 97.8 F (36.6 C)  SpO2: 96%  Weight: 163 lb 3.2 oz (74 kg)   Body mass index is 25.56 kg/m. Physical Exam Vitals and nursing note reviewed.  Constitutional:      Appearance: Normal appearance.  HENT:     Head: Normocephalic and atraumatic.     Mouth/Throat:     Mouth: Mucous membranes are moist.  Eyes:     Extraocular Movements: Extraocular movements intact.     Conjunctiva/sclera: Conjunctivae normal.     Pupils: Pupils are equal, round, and reactive to light.  Cardiovascular:     Rate and Rhythm: Normal rate and regular rhythm.     Heart sounds: No murmur heard. Pulmonary:     Effort: Pulmonary effort is normal.     Breath sounds: Normal breath sounds. No rales.  Abdominal:     General: Bowel sounds are normal.     Palpations: Abdomen is soft.  Tenderness: There is no abdominal tenderness.  Musculoskeletal:        General: No tenderness.     Cervical back: Normal range of motion and neck supple.     Right lower leg: No edema.     Left lower leg: No edema.     Comments: Chronic neck,  middle back, and lower back pain.  No spine no spinous process tenderness palpated  Skin:    General: Skin is warm and dry.     Findings: No rash.  Neurological:     General: No focal deficit present.     Mental Status: She is alert and oriented to person, place, and time. Mental status is at baseline.      Gait: Gait abnormal.  Psychiatric:        Mood and Affect: Mood normal.        Behavior: Behavior normal.        Thought Content: Thought content normal.     Labs reviewed: Recent Labs    04/07/24 0000 07/09/24 0848 08/04/24 0000  NA 139 138 138  K 3.8 4.1 3.8  CL 105 107 106  CO2 25* 19 29*  BUN 21 18 15   CREATININE 1.0 0.8 0.8  CALCIUM  9.3 10.0 9.4   Recent Labs    03/29/24 0000 04/07/24 0000 07/09/24 0848 08/04/24 0000  AST 19 21 23 18   ALT 13 15 14 12   ALKPHOS 73 83 72 66  ALBUMIN 4.1  --  4.4 3.8   Recent Labs    06/30/24 0000 07/09/24 0848 08/04/24 0000  WBC 7.5 6.8 6.0  NEUTROABS 4,508.00 4,162.00 3,390.00  HGB 11.9* 11.8* 11.4*  HCT 37 36 36  PLT 212 193 157   Lab Results  Component Value Date   TSH 3.49 08/11/2023   Lab Results  Component Value Date   HGBA1C 5.8 08/11/2023   Lab Results  Component Value Date   CHOL 139 06/11/2022   HDL 56 06/11/2022   LDLCALC 70 06/11/2022   TRIG 57 06/11/2022   CHOLHDL 2.5 06/11/2022    Significant Diagnostic Results in last 30 days:  CT Head Wo Contrast Result Date: 09/02/2024 EXAM: CT HEAD AND CERVICAL SPINE 09/02/2024 03:00:11 AM TECHNIQUE: CT of the head and cervical spine was performed without the administration of intravenous contrast. Multiplanar reformatted images are provided for review. Automated exposure control, iterative reconstruction, and/or weight based adjustment of the mA/kV was utilized to reduce the radiation dose to as low as reasonably achievable. COMPARISON: None available. CLINICAL HISTORY: Polytrauma, blunt. c/o of repeated falls, 4 in the past month, ; Declines blood thinners. Pt did hit her head, denies LOC or dizziness, swelling to left knee noted. FINDINGS: CT HEAD BRAIN AND VENTRICLES: No acute intracranial hemorrhage. No mass effect or midline shift. No abnormal extra-axial fluid collection. No evidence of acute infarct. No hydrocephalus. Confluent chronic ischemic white matter  changes. ORBITS: No acute abnormality. SINUSES AND MASTOIDS: No acute abnormality. SOFT TISSUES AND SKULL: No acute skull fracture. No acute soft tissue abnormality. CT CERVICAL SPINE BONES AND ALIGNMENT: No acute fracture or traumatic malalignment. DEGENERATIVE CHANGES: Multilevel facet arthrosis. SOFT TISSUES: No prevertebral soft tissue swelling. IMPRESSION: 1. No acute intracranial abnormality. 2. No acute fracture or traumatic malalignment of the cervical spine. Electronically signed by: Franky Stanford MD 09/02/2024 03:28 AM EDT RP Workstation: [id number]   CT Cervical Spine Wo Contrast Result Date: 09/02/2024 EXAM: CT HEAD AND CERVICAL SPINE 09/02/2024 03:00:11 AM TECHNIQUE: CT of the head  and cervical spine was performed without the administration of intravenous contrast. Multiplanar reformatted images are provided for review. Automated exposure control, iterative reconstruction, and/or weight based adjustment of the mA/kV was utilized to reduce the radiation dose to as low as reasonably achievable. COMPARISON: None available. CLINICAL HISTORY: Polytrauma, blunt. c/o of repeated falls, 4 in the past month, ; Declines blood thinners. Pt did hit her head, denies LOC or dizziness, swelling to left knee noted. FINDINGS: CT HEAD BRAIN AND VENTRICLES: No acute intracranial hemorrhage. No mass effect or midline shift. No abnormal extra-axial fluid collection. No evidence of acute infarct. No hydrocephalus. Confluent chronic ischemic white matter changes. ORBITS: No acute abnormality. SINUSES AND MASTOIDS: No acute abnormality. SOFT TISSUES AND SKULL: No acute skull fracture. No acute soft tissue abnormality. CT CERVICAL SPINE BONES AND ALIGNMENT: No acute fracture or traumatic malalignment. DEGENERATIVE CHANGES: Multilevel facet arthrosis. SOFT TISSUES: No prevertebral soft tissue swelling. IMPRESSION: 1. No acute intracranial abnormality. 2. No acute fracture or traumatic malalignment of the cervical spine.  Electronically signed by: Franky Stanford MD 09/02/2024 03:28 AM EDT RP Workstation: [id number]   DG Knee Complete 4 Views Left Result Date: 09/02/2024 CLINICAL DATA:  Fall, left knee pain EXAM: LEFT KNEE - COMPLETE 4+ VIEW COMPARISON:  None Available. FINDINGS: No evidence of fracture, dislocation, or joint effusion. No evidence of arthropathy or other focal bone abnormality. Soft tissues are unremarkable. IMPRESSION: Negative. Electronically Signed   By: Dorethia Molt M.D.   On: 09/02/2024 02:34   DG HIP UNILAT WITH PELVIS 2-3 VIEWS LEFT Result Date: 09/02/2024 CLINICAL DATA:  Fall, pelvic pain EXAM: DG HIP (WITH OR WITHOUT PELVIS) 2-3V LEFT COMPARISON:  None Available. FINDINGS: There is no evidence of hip fracture or dislocation. There is no evidence of arthropathy or other focal bone abnormality. IMPRESSION: Negative. Electronically Signed   By: Dorethia Molt M.D.   On: 09/02/2024 02:33   DG Chest Portable 1 View Result Date: 09/02/2024 CLINICAL DATA:  Fall EXAM: PORTABLE CHEST 1 VIEW COMPARISON:  04/02/2023 FINDINGS: Lungs volumes are small. Bibasilar atelectasis or infiltrate is present. No pneumothorax or pleural effusion. Cardiac size within normal limits given the degree of pulmonary hypoinflation. Pulmonary vascularity is normal. Osseous structures are age-appropriate. No acute bone abnormality. IMPRESSION: 1. Pulmonary hypoinflation. Bibasilar atelectasis or infiltrate. Inferior Electronically Signed   By: Dorethia Molt M.D.   On: 09/02/2024 02:32   DG Thoracic Spine 2 View Result Date: 08/16/2024 EXAM: 2 VIEW(S) XRAY OF THE THORACIC SPINE 08/16/2024 06:12:00 AM COMPARISON: CT of the thoracic spine dated 06/05/2023. CLINICAL HISTORY: Fall. FINDINGS: BONES: No acute fracture. No aggressive appearing osseous lesion. Alignment is normal. DISCS AND DEGENERATIVE CHANGES: Moderate chronic degenerative disc disease at T11-12 with disc space narrowing and sclerosis of the endplates anteriorly. SOFT  TISSUES: The visualized lungs are clear. IMPRESSION: 1. No acute fracture. 2. Moderate chronic degenerative disc disease at T11-T12 with disc space narrowing and sclerosis of the endplates anteriorly. Electronically signed by: Evalene Coho MD 08/16/2024 06:21 AM EDT RP Workstation: [id number]   CT Cervical Spine Wo Contrast Result Date: 08/16/2024 EXAM: CT CERVICAL SPINE WITHOUT CONTRAST 08/16/2024 05:48:24 AM TECHNIQUE: CT of the cervical spine was performed without the administration of intravenous contrast. Multiplanar reformatted images are provided for review. Automated exposure control, iterative reconstruction, and/or weight based adjustment of the mA/kV was utilized to reduce the radiation dose to as low as reasonably achievable. COMPARISON: CT of the cervical spine dated 12/18/2023. CLINICAL HISTORY: Neck trauma (Age >= 65y). Pt  to ED via [facility] from [facility] nursing facility. Pt had an unwitnessed fall, c/o right knee and hip pain and back pain. Pt does not take blood thinners. Pt states she did not hit her head and did not have LOC. FINDINGS: CERVICAL SPINE: BONES AND ALIGNMENT: No acute fracture or traumatic malalignment. DEGENERATIVE CHANGES: There is slight degenerative anterolisthesis at C3-4 with moderate bilateral facet arthrosis. There is also slight degenerative anterolisthesis at C4-5. At C5-6, there is disc space narrowing and diffuse posterior endplate ridging with moderate central spinal canal stenosis and moderate-to-severe bilateral neural foraminal stenosis. At C6-7 there is moderate central spinal canal stenosis and moderate bilateral neural foraminal stenosis. There is moderate diffuse facet arthrosis throughout the cervical spine. SOFT TISSUES: No prevertebral soft tissue swelling. IMPRESSION: 1. No acute abnormality of the cervical spine related to the reported neck trauma. 2. Slight degenerative anterolisthesis at C3-4 and C4-5 with moderate bilateral facet  arthrosis at C3-4. 3. Disc space narrowing and diffuse posterior endplate ridging at C5-6 with moderate central spinal canal stenosis and moderate-to-severe bilateral neural foraminal stenosis. 4. Moderate central spinal canal stenosis and moderate bilateral neural foraminal stenosis at C6-7. 5. Moderate diffuse facet arthrosis throughout the cervical spine. Electronically signed by: Evalene Coho MD 08/16/2024 05:57 AM EDT RP Workstation: [id number]   DG Hip Unilat W or Wo Pelvis 2-3 Views Left Result Date: 08/16/2024 CLINICAL DATA:  Fall injury with blunt polytrauma. EXAM: DG HIP (WITH OR WITHOUT PELVIS) 2-3V LEFT; LUMBAR SPINE - COMPLETE 4+ VIEW; LEFT KNEE - COMPLETE 4+ VIEW COMPARISON:  Lumbar spine CT 06/03/2021, CT pelvis 06/03/2021. No prior left knee series. FINDINGS: Lumbar spine, four views with AP Lat, both obliques: There is mild-to-moderate dextrorotary lumbar scoliosis, apex L2. Lateral view again demonstrating minimal grade 1 degenerative retrolisthesis at T12-L1, L1-2 and L2-3. Osteopenia. There is mild chronic wedge compression deformity of T12. The lumbar vertebrae are normal in heights without evidence of acute fracture. There is multilevel bridging marginal osteophytosis to the left. The discs are degenerated and collapsed with vacuum phenomenon at L3-4 and L5-S1, disc space loss also at L1-2 and L2-3. There is facet hypertrophy from L1-2 down, greatest at the lowest 3 levels where it is greater on the right Both SI joints are patent. There is patchy calcific plaque in the abdominal aorta. Three views AP pelvis and AP and frog-leg left hip: There is no evidence of pelvic fracture or diastasis. No evidence of left hip fracture or dislocation. Joint spaces are maintained. Osteopenia. There is no other focal bone abnormality. Pelvic phleboliths. Soft tissues are otherwise unremarkable. Left knee, four views: There is osteopenia. No evidence of fractures, dislocation, arthropathy or other focal  bone abnormality. No joint effusion is seen. There R calcifications in the distal quadriceps tendon. Scattered phleboliths in the upper foreleg. Soft tissues are otherwise unremarkable. IMPRESSION: 1. Osteopenia and degenerative change without evidence of acute fractures of the lumbar spine, pelvis, left hip or left knee. 2. Chronic mild wedge compression deformity of T12. 3. Aortic atherosclerosis. 4. Calcifications in the distal quadriceps tendon. Electronically Signed   By: Francis Quam M.D.   On: 08/16/2024 04:38   DG Knee Complete 4 Views Left Result Date: 08/16/2024 CLINICAL DATA:  Fall injury with blunt polytrauma. EXAM: DG HIP (WITH OR WITHOUT PELVIS) 2-3V LEFT; LUMBAR SPINE - COMPLETE 4+ VIEW; LEFT KNEE - COMPLETE 4+ VIEW COMPARISON:  Lumbar spine CT 06/03/2021, CT pelvis 06/03/2021. No prior left knee series. FINDINGS: Lumbar spine, four  views with AP Lat, both obliques: There is mild-to-moderate dextrorotary lumbar scoliosis, apex L2. Lateral view again demonstrating minimal grade 1 degenerative retrolisthesis at T12-L1, L1-2 and L2-3. Osteopenia. There is mild chronic wedge compression deformity of T12. The lumbar vertebrae are normal in heights without evidence of acute fracture. There is multilevel bridging marginal osteophytosis to the left. The discs are degenerated and collapsed with vacuum phenomenon at L3-4 and L5-S1, disc space loss also at L1-2 and L2-3. There is facet hypertrophy from L1-2 down, greatest at the lowest 3 levels where it is greater on the right Both SI joints are patent. There is patchy calcific plaque in the abdominal aorta. Three views AP pelvis and AP and frog-leg left hip: There is no evidence of pelvic fracture or diastasis. No evidence of left hip fracture or dislocation. Joint spaces are maintained. Osteopenia. There is no other focal bone abnormality. Pelvic phleboliths. Soft tissues are otherwise unremarkable. Left knee, four views: There is osteopenia. No evidence  of fractures, dislocation, arthropathy or other focal bone abnormality. No joint effusion is seen. There R calcifications in the distal quadriceps tendon. Scattered phleboliths in the upper foreleg. Soft tissues are otherwise unremarkable. IMPRESSION: 1. Osteopenia and degenerative change without evidence of acute fractures of the lumbar spine, pelvis, left hip or left knee. 2. Chronic mild wedge compression deformity of T12. 3. Aortic atherosclerosis. 4. Calcifications in the distal quadriceps tendon. Electronically Signed   By: Francis Quam M.D.   On: 08/16/2024 04:38   DG Lumbar Spine Complete Result Date: 08/16/2024 CLINICAL DATA:  Fall injury with blunt polytrauma. EXAM: DG HIP (WITH OR WITHOUT PELVIS) 2-3V LEFT; LUMBAR SPINE - COMPLETE 4+ VIEW; LEFT KNEE - COMPLETE 4+ VIEW COMPARISON:  Lumbar spine CT 06/03/2021, CT pelvis 06/03/2021. No prior left knee series. FINDINGS: Lumbar spine, four views with AP Lat, both obliques: There is mild-to-moderate dextrorotary lumbar scoliosis, apex L2. Lateral view again demonstrating minimal grade 1 degenerative retrolisthesis at T12-L1, L1-2 and L2-3. Osteopenia. There is mild chronic wedge compression deformity of T12. The lumbar vertebrae are normal in heights without evidence of acute fracture. There is multilevel bridging marginal osteophytosis to the left. The discs are degenerated and collapsed with vacuum phenomenon at L3-4 and L5-S1, disc space loss also at L1-2 and L2-3. There is facet hypertrophy from L1-2 down, greatest at the lowest 3 levels where it is greater on the right Both SI joints are patent. There is patchy calcific plaque in the abdominal aorta. Three views AP pelvis and AP and frog-leg left hip: There is no evidence of pelvic fracture or diastasis. No evidence of left hip fracture or dislocation. Joint spaces are maintained. Osteopenia. There is no other focal bone abnormality. Pelvic phleboliths. Soft tissues are otherwise unremarkable. Left  knee, four views: There is osteopenia. No evidence of fractures, dislocation, arthropathy or other focal bone abnormality. No joint effusion is seen. There R calcifications in the distal quadriceps tendon. Scattered phleboliths in the upper foreleg. Soft tissues are otherwise unremarkable. IMPRESSION: 1. Osteopenia and degenerative change without evidence of acute fractures of the lumbar spine, pelvis, left hip or left knee. 2. Chronic mild wedge compression deformity of T12. 3. Aortic atherosclerosis. 4. Calcifications in the distal quadriceps tendon. Electronically Signed   By: Francis Quam M.D.   On: 08/16/2024 04:38   CT Head Wo Contrast Result Date: 08/16/2024 EXAM: CT HEAD WITHOUT CONTRAST 08/16/2024 03:58:38 AM TECHNIQUE: CT of the head was performed without the administration of intravenous contrast. Automated exposure  control, iterative reconstruction, and/or weight based adjustment of the mA/kV was utilized to reduce the radiation dose to as low as reasonably achievable. COMPARISON: 12/18/2023. CLINICAL HISTORY: Head trauma, minor (Age >= 65y). Fall FINDINGS: BRAIN AND VENTRICLES: No acute hemorrhage. Gray-white differentiation is preserved. No hydrocephalus. No extra-axial collection. No mass effect or midline shift. Age-related volume loss and extensive cerebral white matter disease. ORBITS: The patient is status post bilateral lens replacement. SINUSES: No acute abnormality. SOFT TISSUES AND SKULL: No acute soft tissue abnormality. No skull fracture. IMPRESSION: 1. No acute intracranial abnormality. 2. Age-related volume loss and extensive cerebral white matter disease. Electronically signed by: Evalene Coho MD 08/16/2024 04:09 AM EDT RP Workstation: [id number]    Assessment/Plan: PNA (pneumonia)  pneumonia, chest x-ray showed 09/08/2024 left pleural effusion associated with atelectasis or pneumonia.  The patient has no nasal congestion, some cough, no phlegm production, denied chest pain,  shortness of breath, palpitation, or nausea/vomiting.  The patient is afebrile, no O2 desaturation, she does admit generalized malaise, her appetite is not optimal. Doxycycline 100 mg by mouth 2 times a day for 7 days  Anxiety Lorazepam  prn available to her.  Allergic to as SSRI, failed Cymbalta , BuSpar  HTN (hypertension) Blood pressure is controlled, taking Losartan , Metoprolol ,  takes ASA, Bun/creat 15/0.8 08/04/24  GERD (gastroesophageal reflux disease) stable on Omeprazole , Hgb 11.4 08/04/24    Family/ staff Communication: plan of care reviewed with the patient and charge nurse.   Labs/tests ordered:  none

## 2024-09-09 NOTE — Assessment & Plan Note (Signed)
 stable on Omeprazole , Hgb 11.4 08/04/24

## 2024-09-10 ENCOUNTER — Telehealth: Payer: Self-pay | Admitting: Adult Health

## 2024-09-11 MED ORDER — CIPROFLOXACIN HCL 250 MG PO TABS: 250.0000 mg | ORAL_TABLET | Freq: Two times a day (BID) | ORAL | Status: AC

## 2024-09-11 NOTE — Telephone Encounter (Signed)
 Nurse called to report UA C and S result with >100K of Ecoli. Pt is on doxycycline for pna.  Has multiple allergies Will add cipro . Provider f/u.

## 2024-09-12 ENCOUNTER — Non-Acute Institutional Stay: Payer: Self-pay | Admitting: Sports Medicine

## 2024-09-12 ENCOUNTER — Encounter: Payer: Self-pay | Admitting: Sports Medicine

## 2024-09-12 DIAGNOSIS — I10 Essential (primary) hypertension: Secondary | ICD-10-CM

## 2024-09-12 DIAGNOSIS — N3000 Acute cystitis without hematuria: Secondary | ICD-10-CM

## 2024-09-12 DIAGNOSIS — K219 Gastro-esophageal reflux disease without esophagitis: Secondary | ICD-10-CM | POA: Diagnosis not present

## 2024-09-12 DIAGNOSIS — J189 Pneumonia, unspecified organism: Secondary | ICD-10-CM | POA: Diagnosis not present

## 2024-09-12 NOTE — Progress Notes (Signed)
 Location:  [facility] Room Number: 820-A Place of Service:  ALF [phone]) Provider:  Sherlynn Albert, MD   Mast, Man X, NP  Patient Care Team: Mast, Man X, NP as PCP - General (Internal Medicine) Mast, Man X, NP as Nurse Practitioner (Internal Medicine)  Extended Emergency Contact Information Primary Emergency Contact: [facility] Address: [address]          [place], [place] [address] [place] Home Phone: [phone] Mobile Phone: [phone] Relation: Son  Code Status:  DNR Goals of care: Advanced Directive information    09/12/2024   11:18 AM  Advanced Directives  Does Patient Have a Medical Advance Directive? Yes  Type of Advance Directive Out of facility DNR (pink MOST or yellow form);Living will  Does patient want to make changes to medical advance directive? No - Patient declined  Copy of Healthcare Power of Attorney in Chart? Yes - validated most recent copy scanned in chart (See row information)     Chief Complaint  Patient presents with   Acute Visit    UTI     HPI:  Pt is a 89 y.o. female seen today for an acute visit for follow up on her lab results Pt seen and examined in her room, she is sitting on her bed cleaning up her mail.  States she does not have an appetite.She did not  eat her breakfast much. Denies chest pain, palpitations, SOB, abdominal pain, nausea,vomiting, dysuria. As per nursing staff no change in mentation.  Pt was recently seen by NP for pneumonia and started on doxycycline. Pt is able to speak in full sentences and does not appear to be in distress.  Chest x ray  No acute osseous or soft tissue abnormality Cardiac and mediastinal contours are within normal limits No pneumothorax appreciated There is blunting of the costophrenic sulcus on the left with obscuration of the underlying diaphragm Peribronchial distention with peribronchial cuffing is present  This nonspecific finding indicates  chronic or acute perihilar inflammation Bilateral pulmonary hyperlucency is suggestive of underlying emphysema IMPRESSION Left pleural effusion  Associated atelectasis or pneumonia  Urine culture - sec to cipro      Past Medical History:  Diagnosis Date   GERD (gastroesophageal reflux disease)    Hx of cataract surgery    both eyes   Hyperglycemia    Hyperlipidemia    Hypertension    Lumbago 04/05/2016   Macular degeneration    ? which eye(s)   Obstructive sleep apnea    Osteoporosis, senile    Overactive bladder    Squamous cell skin cancer 2/16   left lower leg   Past Surgical History:  Procedure Laterality Date   ABDOMINAL HYSTERECTOMY  1960's   APPENDECTOMY  1960's   MOLE REMOVAL  2000   facial   TONSILLECTOMY      Allergies  Allergen Reactions   Adhesive [Tape]    Ampicillin    Elemental Sulfur    Nitrofuran Derivatives    Zoloft  [Sertraline  Hcl] Other (See Comments)    Dizziness, nausea, faint feeling, and nightmares      Allergies as of 09/12/2024       Reactions   Adhesive [tape]    Ampicillin    Elemental Sulfur    Nitrofuran Derivatives    Zoloft  [sertraline  Hcl] Other (See Comments)   Dizziness, nausea, faint feeling, and nightmares          Medication List  Accurate as of September 12, 2024 11:20 AM. If you have any questions, ask your nurse or doctor.          acetaminophen  500 MG tablet Commonly known as: TYLENOL  Take 500 mg by mouth daily.   acetaminophen  325 MG tablet Commonly known as: TYLENOL  Take 650 mg by mouth every 4 (four) hours as needed. 2 tablets by mouth every 4 hours as need for fever and pain   aspirin  EC 81 MG tablet Take 1 tablet (81 mg total) by mouth daily.   Calcium  Carb-Cholecalciferol 600-20 MG-MCG Tabs Take 1 tablet by mouth 2 (two) times daily.   ciprofloxacin  250 MG tablet Commonly known as: Cipro  Take 1 tablet (250 mg total) by mouth 2 (two) times daily for 7 days.   doxycycline 100 MG  tablet Commonly known as: VIBRA-TABS Take 100 mg by mouth 2 (two) times daily.   ferrous sulfate  325 (65 FE) MG EC tablet Take 325 mg by mouth 3 (three) times a week. Monday, Wednesday, and Friday.   loperamide 2 MG tablet Commonly known as: IMODIUM A-D Take 2 mg by mouth every 6 (six) hours as needed for diarrhea or loose stools.   LORazepam  0.5 MG tablet Commonly known as: ATIVAN  TAKE ONE TABLET ONCE DAILY AS NEEDED FOR ANXIETY.   losartan  100 MG tablet Commonly known as: COZAAR  Take 100 mg by mouth daily.   metoprolol  succinate 25 MG 24 hr tablet Commonly known as: TOPROL -XL TAKE 1/2 TABLET ONCE A DAY.   Ocuvite Adult 50+ Caps Take 1 capsule by mouth every evening.   omeprazole  40 MG capsule Commonly known as: PRILOSEC TAKE 1 CAPSULE DAILY 1/2 HOUR BEFORE BREAKFAST FOR STOMACH ACID.   Systane 0.4-0.3 % Gel ophthalmic gel Generic drug: Polyethyl Glycol-Propyl Glycol Place 1 application into both eyes daily.   TRIAMCINOLONE ACETONIDE(NASAL) NA Place 2 sprays into the nose daily. Each nostril        Review of Systems  Constitutional:  Negative for chills and fever.  Respiratory:  Negative for cough, shortness of breath and wheezing.   Cardiovascular:  Negative for chest pain and leg swelling.  Gastrointestinal:  Negative for abdominal pain, blood in stool, constipation, diarrhea, nausea and vomiting.  Genitourinary:  Negative for dysuria.  Neurological:  Negative for dizziness.    Immunization History  Administered Date(s) Administered   Fluad Quad(high Dose 65+) 10/12/2019, 10/14/2022   INFLUENZA, HIGH DOSE SEASONAL PF 10/07/2017, 09/28/2021   Influenza Whole 09/30/2018   Influenza-Unspecified 09/27/2015, 10/09/2016, 10/10/2020, 09/30/2023   Moderna Covid-19 Fall Seasonal Vaccine 70yrs & older 10/14/2023   Moderna Covid-19 Vaccine Bivalent Booster 43yrs & up 10/30/2022   Moderna SARS-COV2 Booster Vaccination 10/14/2023   Moderna Sars-Covid-2 Vaccination  01/02/2020, 01/30/2020, 11/06/2020, 05/28/2021, 05/16/2022   PFIZER(Purple Top)SARS-COV-2 Vaccination 09/17/2021   Pfizer Fall 2024 Covid-19 Vaccine 6mos thru 43yrs 05/16/2022   Pneumococcal Conjugate-13 01/23/2015   Pneumococcal Polysaccharide-23 10/20/2007   Respiratory Syncytial Virus Vaccine,Recomb Aduvanted(Arexvy) 12/26/2022   Td 12/14/2012   Tdap 11/05/2023   Pertinent  Health Maintenance Due  Topic Date Due   Influenza Vaccine  09/28/2024 (Originally 07/29/2024)   DEXA SCAN  Completed      03/05/2023    2:48 PM 03/12/2023    3:32 PM 04/23/2023    1:49 PM 05/20/2023    2:12 PM 08/14/2023   12:41 PM  Fall Risk  Falls in the past year? 0 0 0 0 0  Was there an injury with Fall? 0 0 0 0 0  Fall  Risk Category Calculator 0 0 0 0 0  Patient at Risk for Falls Due to No Fall Risks Impaired balance/gait;Impaired mobility History of fall(s) History of fall(s)   Fall risk Follow up Falls evaluation completed Falls evaluation completed Falls evaluation completed Falls evaluation completed Falls evaluation completed   Functional Status Survey:    Vitals:   09/12/24 1102  BP: (!) 173/78  Pulse: 85  Resp: 18  Temp: 98.7 F (37.1 C)  SpO2: 96%  Weight: 163 lb 3.2 oz (74 kg)  Height: 5' 7 (1.702 m)   Body mass index is 25.56 kg/m. Physical Exam Constitutional:      Appearance: Normal appearance.  HENT:     Head: Normocephalic and atraumatic.  Cardiovascular:     Rate and Rhythm: Normal rate and regular rhythm.  Pulmonary:     Effort: Pulmonary effort is normal. No respiratory distress.     Breath sounds: Normal breath sounds. No wheezing.  Abdominal:     General: Bowel sounds are normal. There is no distension.     Tenderness: There is no abdominal tenderness. There is no guarding or rebound.     Comments:    Musculoskeletal:        General: No swelling.  Neurological:     Mental Status: She is alert. Mental status is at baseline.     Motor: No weakness.     Labs  reviewed: Recent Labs    04/07/24 0000 07/09/24 0848 08/04/24 0000  NA 139 138 138  K 3.8 4.1 3.8  CL 105 107 106  CO2 25* 19 29*  BUN 21 18 15   CREATININE 1.0 0.8 0.8  CALCIUM  9.3 10.0 9.4   Recent Labs    03/29/24 0000 04/07/24 0000 07/09/24 0848 08/04/24 0000  AST 19 21 23 18   ALT 13 15 14 12   ALKPHOS 73 83 72 66  ALBUMIN 4.1  --  4.4 3.8   Recent Labs    06/30/24 0000 07/09/24 0848 08/04/24 0000  WBC 7.5 6.8 6.0  NEUTROABS 4,508.00 4,162.00 3,390.00  HGB 11.9* 11.8* 11.4*  HCT 37 36 36  PLT 212 193 157   Lab Results  Component Value Date   TSH 3.49 08/11/2023   Lab Results  Component Value Date   HGBA1C 5.8 08/11/2023   Lab Results  Component Value Date   CHOL 139 06/11/2022   HDL 56 06/11/2022   LDLCALC 70 06/11/2022   TRIG 57 06/11/2022   CHOLHDL 2.5 06/11/2022    Significant Diagnostic Results in last 30 days:  CT Head Wo Contrast Result Date: 09/02/2024 EXAM: CT HEAD AND CERVICAL SPINE 09/02/2024 03:00:11 AM TECHNIQUE: CT of the head and cervical spine was performed without the administration of intravenous contrast. Multiplanar reformatted images are provided for review. Automated exposure control, iterative reconstruction, and/or weight based adjustment of the mA/kV was utilized to reduce the radiation dose to as low as reasonably achievable. COMPARISON: None available. CLINICAL HISTORY: Polytrauma, blunt. c/o of repeated falls, 4 in the past month, ; Declines blood thinners. Pt did hit her head, denies LOC or dizziness, swelling to left knee noted. FINDINGS: CT HEAD BRAIN AND VENTRICLES: No acute intracranial hemorrhage. No mass effect or midline shift. No abnormal extra-axial fluid collection. No evidence of acute infarct. No hydrocephalus. Confluent chronic ischemic white matter changes. ORBITS: No acute abnormality. SINUSES AND MASTOIDS: No acute abnormality. SOFT TISSUES AND SKULL: No acute skull fracture. No acute soft tissue abnormality. CT  CERVICAL SPINE BONES AND ALIGNMENT:  No acute fracture or traumatic malalignment. DEGENERATIVE CHANGES: Multilevel facet arthrosis. SOFT TISSUES: No prevertebral soft tissue swelling. IMPRESSION: 1. No acute intracranial abnormality. 2. No acute fracture or traumatic malalignment of the cervical spine. Electronically signed by: Franky Stanford MD 09/02/2024 03:28 AM EDT RP Workstation: [id number]   CT Cervical Spine Wo Contrast Result Date: 09/02/2024 EXAM: CT HEAD AND CERVICAL SPINE 09/02/2024 03:00:11 AM TECHNIQUE: CT of the head and cervical spine was performed without the administration of intravenous contrast. Multiplanar reformatted images are provided for review. Automated exposure control, iterative reconstruction, and/or weight based adjustment of the mA/kV was utilized to reduce the radiation dose to as low as reasonably achievable. COMPARISON: None available. CLINICAL HISTORY: Polytrauma, blunt. c/o of repeated falls, 4 in the past month, ; Declines blood thinners. Pt did hit her head, denies LOC or dizziness, swelling to left knee noted. FINDINGS: CT HEAD BRAIN AND VENTRICLES: No acute intracranial hemorrhage. No mass effect or midline shift. No abnormal extra-axial fluid collection. No evidence of acute infarct. No hydrocephalus. Confluent chronic ischemic white matter changes. ORBITS: No acute abnormality. SINUSES AND MASTOIDS: No acute abnormality. SOFT TISSUES AND SKULL: No acute skull fracture. No acute soft tissue abnormality. CT CERVICAL SPINE BONES AND ALIGNMENT: No acute fracture or traumatic malalignment. DEGENERATIVE CHANGES: Multilevel facet arthrosis. SOFT TISSUES: No prevertebral soft tissue swelling. IMPRESSION: 1. No acute intracranial abnormality. 2. No acute fracture or traumatic malalignment of the cervical spine. Electronically signed by: Franky Stanford MD 09/02/2024 03:28 AM EDT RP Workstation: [id number]   DG Knee Complete 4 Views Left Result Date: 09/02/2024 CLINICAL DATA:  Fall,  left knee pain EXAM: LEFT KNEE - COMPLETE 4+ VIEW COMPARISON:  None Available. FINDINGS: No evidence of fracture, dislocation, or joint effusion. No evidence of arthropathy or other focal bone abnormality. Soft tissues are unremarkable. IMPRESSION: Negative. Electronically Signed   By: Dorethia Molt M.D.   On: 09/02/2024 02:34   DG HIP UNILAT WITH PELVIS 2-3 VIEWS LEFT Result Date: 09/02/2024 CLINICAL DATA:  Fall, pelvic pain EXAM: DG HIP (WITH OR WITHOUT PELVIS) 2-3V LEFT COMPARISON:  None Available. FINDINGS: There is no evidence of hip fracture or dislocation. There is no evidence of arthropathy or other focal bone abnormality. IMPRESSION: Negative. Electronically Signed   By: Dorethia Molt M.D.   On: 09/02/2024 02:33   DG Chest Portable 1 View Result Date: 09/02/2024 CLINICAL DATA:  Fall EXAM: PORTABLE CHEST 1 VIEW COMPARISON:  04/02/2023 FINDINGS: Lungs volumes are small. Bibasilar atelectasis or infiltrate is present. No pneumothorax or pleural effusion. Cardiac size within normal limits given the degree of pulmonary hypoinflation. Pulmonary vascularity is normal. Osseous structures are age-appropriate. No acute bone abnormality. IMPRESSION: 1. Pulmonary hypoinflation. Bibasilar atelectasis or infiltrate. Inferior Electronically Signed   By: Dorethia Molt M.D.   On: 09/02/2024 02:32   DG Thoracic Spine 2 View Result Date: 08/16/2024 EXAM: 2 VIEW(S) XRAY OF THE THORACIC SPINE 08/16/2024 06:12:00 AM COMPARISON: CT of the thoracic spine dated 06/05/2023. CLINICAL HISTORY: Fall. FINDINGS: BONES: No acute fracture. No aggressive appearing osseous lesion. Alignment is normal. DISCS AND DEGENERATIVE CHANGES: Moderate chronic degenerative disc disease at T11-12 with disc space narrowing and sclerosis of the endplates anteriorly. SOFT TISSUES: The visualized lungs are clear. IMPRESSION: 1. No acute fracture. 2. Moderate chronic degenerative disc disease at T11-T12 with disc space narrowing and sclerosis of  the endplates anteriorly. Electronically signed by: Evalene Coho MD 08/16/2024 06:21 AM EDT RP Workstation: [id number]   CT Cervical Spine Wo Contrast Result Date:  08/16/2024 EXAM: CT CERVICAL SPINE WITHOUT CONTRAST 08/16/2024 05:48:24 AM TECHNIQUE: CT of the cervical spine was performed without the administration of intravenous contrast. Multiplanar reformatted images are provided for review. Automated exposure control, iterative reconstruction, and/or weight based adjustment of the mA/kV was utilized to reduce the radiation dose to as low as reasonably achievable. COMPARISON: CT of the cervical spine dated 12/18/2023. CLINICAL HISTORY: Neck trauma (Age >= 65y). Pt to ED via [facility] from [facility] [place] facility. Pt had an unwitnessed fall, c/o right knee and hip pain and back pain. Pt does not take blood thinners. Pt states she did not hit her head and did not have LOC. FINDINGS: CERVICAL SPINE: BONES AND ALIGNMENT: No acute fracture or traumatic malalignment. DEGENERATIVE CHANGES: There is slight degenerative anterolisthesis at C3-4 with moderate bilateral facet arthrosis. There is also slight degenerative anterolisthesis at C4-5. At C5-6, there is disc space narrowing and diffuse posterior endplate ridging with moderate central spinal canal stenosis and moderate-to-severe bilateral neural foraminal stenosis. At C6-7 there is moderate central spinal canal stenosis and moderate bilateral neural foraminal stenosis. There is moderate diffuse facet arthrosis throughout the cervical spine. SOFT TISSUES: No prevertebral soft tissue swelling. IMPRESSION: 1. No acute abnormality of the cervical spine related to the reported neck trauma. 2. Slight degenerative anterolisthesis at C3-4 and C4-5 with moderate bilateral facet arthrosis at C3-4. 3. Disc space narrowing and diffuse posterior endplate ridging at C5-6 with moderate central spinal canal stenosis and moderate-to-severe bilateral neural  foraminal stenosis. 4. Moderate central spinal canal stenosis and moderate bilateral neural foraminal stenosis at C6-7. 5. Moderate diffuse facet arthrosis throughout the cervical spine. Electronically signed by: Evalene Coho MD 08/16/2024 05:57 AM EDT RP Workstation: [id number]   DG Hip Unilat W or Wo Pelvis 2-3 Views Left Result Date: 08/16/2024 CLINICAL DATA:  Fall injury with blunt polytrauma. EXAM: DG HIP (WITH OR WITHOUT PELVIS) 2-3V LEFT; LUMBAR SPINE - COMPLETE 4+ VIEW; LEFT KNEE - COMPLETE 4+ VIEW COMPARISON:  Lumbar spine CT 06/03/2021, CT pelvis 06/03/2021. No prior left knee series. FINDINGS: Lumbar spine, four views with AP Lat, both obliques: There is mild-to-moderate dextrorotary lumbar scoliosis, apex L2. Lateral view again demonstrating minimal grade 1 degenerative retrolisthesis at T12-L1, L1-2 and L2-3. Osteopenia. There is mild chronic wedge compression deformity of T12. The lumbar vertebrae are normal in heights without evidence of acute fracture. There is multilevel bridging marginal osteophytosis to the left. The discs are degenerated and collapsed with vacuum phenomenon at L3-4 and L5-S1, disc space loss also at L1-2 and L2-3. There is facet hypertrophy from L1-2 down, greatest at the lowest 3 levels where it is greater on the right Both SI joints are patent. There is patchy calcific plaque in the abdominal aorta. Three views AP pelvis and AP and frog-leg left hip: There is no evidence of pelvic fracture or diastasis. No evidence of left hip fracture or dislocation. Joint spaces are maintained. Osteopenia. There is no other focal bone abnormality. Pelvic phleboliths. Soft tissues are otherwise unremarkable. Left knee, four views: There is osteopenia. No evidence of fractures, dislocation, arthropathy or other focal bone abnormality. No joint effusion is seen. There R calcifications in the distal quadriceps tendon. Scattered phleboliths in the upper foreleg. Soft tissues are otherwise  unremarkable. IMPRESSION: 1. Osteopenia and degenerative change without evidence of acute fractures of the lumbar spine, pelvis, left hip or left knee. 2. Chronic mild wedge compression deformity of T12. 3. Aortic atherosclerosis. 4. Calcifications in the distal quadriceps tendon.  Electronically Signed   By: Francis Quam M.D.   On: 08/16/2024 04:38   DG Knee Complete 4 Views Left Result Date: 08/16/2024 CLINICAL DATA:  Fall injury with blunt polytrauma. EXAM: DG HIP (WITH OR WITHOUT PELVIS) 2-3V LEFT; LUMBAR SPINE - COMPLETE 4+ VIEW; LEFT KNEE - COMPLETE 4+ VIEW COMPARISON:  Lumbar spine CT 06/03/2021, CT pelvis 06/03/2021. No prior left knee series. FINDINGS: Lumbar spine, four views with AP Lat, both obliques: There is mild-to-moderate dextrorotary lumbar scoliosis, apex L2. Lateral view again demonstrating minimal grade 1 degenerative retrolisthesis at T12-L1, L1-2 and L2-3. Osteopenia. There is mild chronic wedge compression deformity of T12. The lumbar vertebrae are normal in heights without evidence of acute fracture. There is multilevel bridging marginal osteophytosis to the left. The discs are degenerated and collapsed with vacuum phenomenon at L3-4 and L5-S1, disc space loss also at L1-2 and L2-3. There is facet hypertrophy from L1-2 down, greatest at the lowest 3 levels where it is greater on the right Both SI joints are patent. There is patchy calcific plaque in the abdominal aorta. Three views AP pelvis and AP and frog-leg left hip: There is no evidence of pelvic fracture or diastasis. No evidence of left hip fracture or dislocation. Joint spaces are maintained. Osteopenia. There is no other focal bone abnormality. Pelvic phleboliths. Soft tissues are otherwise unremarkable. Left knee, four views: There is osteopenia. No evidence of fractures, dislocation, arthropathy or other focal bone abnormality. No joint effusion is seen. There R calcifications in the distal quadriceps tendon. Scattered  phleboliths in the upper foreleg. Soft tissues are otherwise unremarkable. IMPRESSION: 1. Osteopenia and degenerative change without evidence of acute fractures of the lumbar spine, pelvis, left hip or left knee. 2. Chronic mild wedge compression deformity of T12. 3. Aortic atherosclerosis. 4. Calcifications in the distal quadriceps tendon. Electronically Signed   By: Francis Quam M.D.   On: 08/16/2024 04:38   DG Lumbar Spine Complete Result Date: 08/16/2024 CLINICAL DATA:  Fall injury with blunt polytrauma. EXAM: DG HIP (WITH OR WITHOUT PELVIS) 2-3V LEFT; LUMBAR SPINE - COMPLETE 4+ VIEW; LEFT KNEE - COMPLETE 4+ VIEW COMPARISON:  Lumbar spine CT 06/03/2021, CT pelvis 06/03/2021. No prior left knee series. FINDINGS: Lumbar spine, four views with AP Lat, both obliques: There is mild-to-moderate dextrorotary lumbar scoliosis, apex L2. Lateral view again demonstrating minimal grade 1 degenerative retrolisthesis at T12-L1, L1-2 and L2-3. Osteopenia. There is mild chronic wedge compression deformity of T12. The lumbar vertebrae are normal in heights without evidence of acute fracture. There is multilevel bridging marginal osteophytosis to the left. The discs are degenerated and collapsed with vacuum phenomenon at L3-4 and L5-S1, disc space loss also at L1-2 and L2-3. There is facet hypertrophy from L1-2 down, greatest at the lowest 3 levels where it is greater on the right Both SI joints are patent. There is patchy calcific plaque in the abdominal aorta. Three views AP pelvis and AP and frog-leg left hip: There is no evidence of pelvic fracture or diastasis. No evidence of left hip fracture or dislocation. Joint spaces are maintained. Osteopenia. There is no other focal bone abnormality. Pelvic phleboliths. Soft tissues are otherwise unremarkable. Left knee, four views: There is osteopenia. No evidence of fractures, dislocation, arthropathy or other focal bone abnormality. No joint effusion is seen. There R  calcifications in the distal quadriceps tendon. Scattered phleboliths in the upper foreleg. Soft tissues are otherwise unremarkable. IMPRESSION: 1. Osteopenia and degenerative change without evidence of acute fractures of the  lumbar spine, pelvis, left hip or left knee. 2. Chronic mild wedge compression deformity of T12. 3. Aortic atherosclerosis. 4. Calcifications in the distal quadriceps tendon. Electronically Signed   By: Francis Quam M.D.   On: 08/16/2024 04:38   CT Head Wo Contrast Result Date: 08/16/2024 EXAM: CT HEAD WITHOUT CONTRAST 08/16/2024 03:58:38 AM TECHNIQUE: CT of the head was performed without the administration of intravenous contrast. Automated exposure control, iterative reconstruction, and/or weight based adjustment of the mA/kV was utilized to reduce the radiation dose to as low as reasonably achievable. COMPARISON: 12/18/2023. CLINICAL HISTORY: Head trauma, minor (Age >= 65y). Fall FINDINGS: BRAIN AND VENTRICLES: No acute hemorrhage. Gray-white differentiation is preserved. No hydrocephalus. No extra-axial collection. No mass effect or midline shift. Age-related volume loss and extensive cerebral white matter disease. ORBITS: The patient is status post bilateral lens replacement. SINUSES: No acute abnormality. SOFT TISSUES AND SKULL: No acute soft tissue abnormality. No skull fracture. IMPRESSION: 1. No acute intracranial abnormality. 2. Age-related volume loss and extensive cerebral white matter disease. Electronically signed by: Evalene Coho MD 08/16/2024 04:09 AM EDT RP Workstation: [id number]    Assessment/Plan  UTI  Started on cipro  over the weekend Denies lower abdominal pain, nausea, vomiting, dysuria Urine culture - sensitive to cipro   Increase oral hydration   Pneumonia Denies cough, sob O2 sat 96% Cont with doxycycline   HTN Cont with losartan , toprol    GERD  Cont with omeprazole .    30 min Total time spent for obtaining history,  performing a  medically appropriate examination and evaluation, reviewing the tests,   documenting clinical information in the electronic or other health record,  ,care coordination (not separately reported)

## 2024-09-16 DIAGNOSIS — R2689 Other abnormalities of gait and mobility: Secondary | ICD-10-CM | POA: Diagnosis not present

## 2024-09-16 DIAGNOSIS — M6281 Muscle weakness (generalized): Secondary | ICD-10-CM | POA: Diagnosis not present

## 2024-09-16 DIAGNOSIS — R2681 Unsteadiness on feet: Secondary | ICD-10-CM | POA: Diagnosis not present

## 2024-09-20 ENCOUNTER — Non-Acute Institutional Stay (SKILLED_NURSING_FACILITY): Payer: Self-pay | Admitting: Nurse Practitioner

## 2024-09-20 DIAGNOSIS — F419 Anxiety disorder, unspecified: Secondary | ICD-10-CM | POA: Diagnosis not present

## 2024-09-20 DIAGNOSIS — F5105 Insomnia due to other mental disorder: Secondary | ICD-10-CM

## 2024-09-20 DIAGNOSIS — M8580 Other specified disorders of bone density and structure, unspecified site: Secondary | ICD-10-CM

## 2024-09-20 DIAGNOSIS — G4733 Obstructive sleep apnea (adult) (pediatric): Secondary | ICD-10-CM

## 2024-09-20 DIAGNOSIS — I4892 Unspecified atrial flutter: Secondary | ICD-10-CM | POA: Diagnosis not present

## 2024-09-20 DIAGNOSIS — R7303 Prediabetes: Secondary | ICD-10-CM

## 2024-09-20 DIAGNOSIS — D509 Iron deficiency anemia, unspecified: Secondary | ICD-10-CM

## 2024-09-20 DIAGNOSIS — R410 Disorientation, unspecified: Secondary | ICD-10-CM | POA: Diagnosis not present

## 2024-09-20 DIAGNOSIS — E782 Mixed hyperlipidemia: Secondary | ICD-10-CM

## 2024-09-20 DIAGNOSIS — Z780 Asymptomatic menopausal state: Secondary | ICD-10-CM

## 2024-09-20 DIAGNOSIS — K219 Gastro-esophageal reflux disease without esophagitis: Secondary | ICD-10-CM | POA: Diagnosis not present

## 2024-09-20 DIAGNOSIS — R269 Unspecified abnormalities of gait and mobility: Secondary | ICD-10-CM

## 2024-09-20 DIAGNOSIS — I10 Essential (primary) hypertension: Secondary | ICD-10-CM | POA: Diagnosis not present

## 2024-09-20 NOTE — Assessment & Plan Note (Signed)
off Atorvastatin, LDL 70 06/11/22 

## 2024-09-20 NOTE — Assessment & Plan Note (Signed)
 A-flutter 01/05/22, no recurrence, delay cardiology consultation

## 2024-09-20 NOTE — Assessment & Plan Note (Signed)
 Iron 37 06/30/24, on Fe, Hgb 11.4 08/04/24

## 2024-09-20 NOTE — Assessment & Plan Note (Signed)
 ambulated with walker, risk of falling

## 2024-09-20 NOTE — Progress Notes (Unsigned)
 Location:   [facility] Room Number: 102 Place of Service:  SNF (31) Provider: Larwance Seydina Holliman NP  Omid Deardorff X, NP  Patient Care Team: Moshe Wenger X, NP as PCP - General (Internal Medicine) Keiera Strathman X, NP as Nurse Practitioner (Internal Medicine)  Extended Emergency Contact Information Primary Emergency Contact: [facility] Address: [address]          [place], [place] [address] [place] Home Phone: [phone] Mobile Phone: [phone] Relation: Son  Code Status: DNR Goals of care: Advanced Directive information    09/12/2024   11:18 AM  Advanced Directives  Does Patient Have a Medical Advance Directive? Yes  Type of Advance Directive Out of facility DNR (pink MOST or yellow form);Living will  Does patient want to make changes to medical advance directive? No - Patient declined  Copy of Healthcare Power of Attorney in Chart? Yes - validated most recent copy scanned in chart (See row information)     Chief Complaint  Patient presents with   Acute Visit    Medication review     HPI:  Pt is a 89 y.o. female seen today for an acute visit for medication review following admission to memory care unit [facility]    Intermittent confusion, seems better since off BuSpar, duloxetine , Lexapro.             Gait abnormality, ambulated with walker, risk of falling             Hx of near syncope: not new, no orthostatic hypotension, last episode of dizziness 12/10/23. 09/02/24 CT head: Confluent chronic ischemic white matter changes.              Edema BLE, not apparent, TED             OSA, CPAP, seeing pulmonology.              A-flutter 01/05/22, no recurrence, delay cardiology consultation OP declined Alendronate  after initial consented.  Taking calcium  and vitamin D             Bone spur R foot, callus of foot, f/u podiatrist.              Allergic rhinitis, off Fluticasone nasal spray, Claritin , Atrovent  nasal spray.              Anxiety, Lorazepam  prn available to  her.  Allergic to as SSRI, failed Cymbalta , BuSpar             Elevated TSH 5.07 06/11/22, TSH 5.11 02/10/23, TSH 3.49 08/11/23,  not on supplement.             HTN, taking Losartan , Metoprolol ,  ASA, Bun/creat 15/0.8 08/04/24             GERD, stable on Omeprazole , Hgb 11.4 08/04/24             Hyperlipidemia, off Atorvastatin , LDL 70 06/11/22             Prediabetes, diet controlled, Hgb a1c 5.8 08/11/23             Anemia, Iron 37 06/30/24, on Fe, Hgb 11.4 08/04/24    Past Medical History:  Diagnosis Date   GERD (gastroesophageal reflux disease)    Hx of cataract surgery    both eyes   Hyperglycemia    Hyperlipidemia    Hypertension    Lumbago 04/05/2016   Macular degeneration    ? which eye(s)   Obstructive sleep  apnea    Osteoporosis, senile    Overactive bladder    Squamous cell skin cancer 2/16   left lower leg   Past Surgical History:  Procedure Laterality Date   ABDOMINAL HYSTERECTOMY  1960's   APPENDECTOMY  1960's   MOLE REMOVAL  2000   facial   TONSILLECTOMY      Allergies  Allergen Reactions   Adhesive [Tape]    Ampicillin    Elemental Sulfur    Nitrofuran Derivatives    Zoloft  [Sertraline  Hcl] Other (See Comments)    Dizziness, nausea, faint feeling, and nightmares      Allergies as of 09/20/2024       Reactions   Adhesive [tape]    Ampicillin    Elemental Sulfur    Nitrofuran Derivatives    Zoloft  [sertraline  Hcl] Other (See Comments)   Dizziness, nausea, faint feeling, and nightmares          Medication List        Accurate as of September 20, 2024 11:59 PM. If you have any questions, ask your nurse or doctor.          acetaminophen  500 MG tablet Commonly known as: TYLENOL  Take 500 mg by mouth daily.   acetaminophen  325 MG tablet Commonly known as: TYLENOL  Take 650 mg by mouth every 4 (four) hours as needed. 2 tablets by mouth every 4 hours as need for fever and pain   aspirin  EC 81 MG tablet Take 1 tablet (81 mg total) by mouth daily.    Calcium  Carb-Cholecalciferol 600-20 MG-MCG Tabs Take 1 tablet by mouth 2 (two) times daily.   ferrous sulfate  325 (65 FE) MG EC tablet Take 325 mg by mouth 3 (three) times a week. Monday, Wednesday, and Friday.   loperamide 2 MG tablet Commonly known as: IMODIUM A-D Take 2 mg by mouth every 6 (six) hours as needed for diarrhea or loose stools.   LORazepam  0.5 MG tablet Commonly known as: ATIVAN  TAKE ONE TABLET ONCE DAILY AS NEEDED FOR ANXIETY.   losartan  100 MG tablet Commonly known as: COZAAR  Take 100 mg by mouth daily.   metoprolol  succinate 25 MG 24 hr tablet Commonly known as: TOPROL -XL TAKE 1/2 TABLET ONCE A DAY.   Ocuvite Adult 50+ Caps Take 1 capsule by mouth every evening.   omeprazole  40 MG capsule Commonly known as: PRILOSEC TAKE 1 CAPSULE DAILY 1/2 HOUR BEFORE BREAKFAST FOR STOMACH ACID.   Systane 0.4-0.3 % Gel ophthalmic gel Generic drug: Polyethyl Glycol-Propyl Glycol Place 1 application into both eyes daily.   TRIAMCINOLONE ACETONIDE(NASAL) NA Place 2 sprays into the nose daily. Each nostril        Review of Systems  Constitutional:  Negative for appetite change, fatigue and fever.  HENT:  Positive for rhinorrhea. Negative for congestion, hearing loss, sore throat and trouble swallowing.   Eyes:  Negative for visual disturbance.  Respiratory:  Negative for cough and shortness of breath.   Cardiovascular:  Positive for leg swelling.  Gastrointestinal:  Negative for abdominal pain and constipation.  Genitourinary:  Negative for dysuria and urgency.       Worsened from occasionally urinary leakage to frequent urinary incontinence even if more at night.   Musculoskeletal:  Positive for arthralgias, back pain, gait problem and neck pain.  Skin:  Negative for color change.  Neurological:  Negative for speech difficulty, weakness and light-headedness.       Tingling in toes sometimes, better after moves around.  Tingling and numbness in  the right shin  04/06/24 resolved after rest and elevated leg.   Psychiatric/Behavioral:  Positive for confusion. Negative for sleep disturbance. The patient is nervous/anxious.        Intermittent     Immunization History  Administered Date(s) Administered   Fluad Quad(high Dose 65+) 10/12/2019, 10/14/2022   INFLUENZA, HIGH DOSE SEASONAL PF 10/07/2017, 09/28/2021   Influenza Whole 09/30/2018   Influenza-Unspecified 09/27/2015, 10/09/2016, 10/10/2020, 09/30/2023   Moderna Covid-19 Fall Seasonal Vaccine 47yrs & older 10/14/2023   Moderna Covid-19 Vaccine Bivalent Booster 34yrs & up 10/30/2022   Moderna SARS-COV2 Booster Vaccination 10/14/2023   Moderna Sars-Covid-2 Vaccination 01/02/2020, 01/30/2020, 11/06/2020, 05/28/2021, 05/16/2022   PFIZER(Purple Top)SARS-COV-2 Vaccination 09/17/2021   Pfizer Fall 2024 Covid-19 Vaccine 6mos thru 85yrs 05/16/2022   Pneumococcal Conjugate-13 01/23/2015   Pneumococcal Polysaccharide-23 10/20/2007   Respiratory Syncytial Virus Vaccine,Recomb Aduvanted(Arexvy) 12/26/2022   Td 12/14/2012   Tdap 11/05/2023   Pertinent  Health Maintenance Due  Topic Date Due   Influenza Vaccine  09/28/2024 (Originally 07/29/2024)   DEXA SCAN  Completed      03/05/2023    2:48 PM 03/12/2023    3:32 PM 04/23/2023    1:49 PM 05/20/2023    2:12 PM 08/14/2023   12:41 PM  Fall Risk  Falls in the past year? 0 0 0 0 0  Was there an injury with Fall? 0 0 0 0 0  Fall Risk Category Calculator 0 0 0 0 0  Patient at Risk for Falls Due to No Fall Risks Impaired balance/gait;Impaired mobility History of fall(s) History of fall(s)   Fall risk Follow up Falls evaluation completed Falls evaluation completed Falls evaluation completed Falls evaluation completed Falls evaluation completed   Functional Status Survey:    Vitals:   09/20/24 1125  BP: (!) 152/82  Pulse: 85  Resp: 19  Temp: (!) 97.2 F (36.2 C)  SpO2: 96%  Weight: 153 lb 14.4 oz (69.8 kg)   Body mass index is 24.1 kg/m. Physical  Exam Vitals and nursing note reviewed.  Constitutional:      Appearance: Normal appearance.  HENT:     Head: Normocephalic and atraumatic.     Mouth/Throat:     Mouth: Mucous membranes are moist.  Eyes:     Extraocular Movements: Extraocular movements intact.     Conjunctiva/sclera: Conjunctivae normal.     Pupils: Pupils are equal, round, and reactive to light.  Cardiovascular:     Rate and Rhythm: Normal rate and regular rhythm.     Heart sounds: No murmur heard. Pulmonary:     Effort: Pulmonary effort is normal.     Breath sounds: Normal breath sounds. No rales.  Abdominal:     General: Bowel sounds are normal.     Palpations: Abdomen is soft.     Tenderness: There is no abdominal tenderness.  Musculoskeletal:        General: No tenderness.     Cervical back: Normal range of motion and neck supple.     Right lower leg: Edema present.     Left lower leg: Edema present.     Comments: Chronic neck,  middle back, and lower back pain.  No spine no spinous process tenderness palpated Trace edema in ankles.   Skin:    General: Skin is warm and dry.  Neurological:     General: No focal deficit present.     Mental Status: She is alert and oriented to person, place, and time. Mental status is at baseline.  Gait: Gait abnormal.  Psychiatric:        Mood and Affect: Mood normal.        Behavior: Behavior normal.        Thought Content: Thought content normal.     Labs reviewed: Recent Labs    04/07/24 0000 07/09/24 0848 08/04/24 0000  NA 139 138 138  K 3.8 4.1 3.8  CL 105 107 106  CO2 25* 19 29*  BUN 21 18 15   CREATININE 1.0 0.8 0.8  CALCIUM  9.3 10.0 9.4   Recent Labs    03/29/24 0000 04/07/24 0000 07/09/24 0848 08/04/24 0000  AST 19 21 23 18   ALT 13 15 14 12   ALKPHOS 73 83 72 66  ALBUMIN 4.1  --  4.4 3.8   Recent Labs    06/30/24 0000 07/09/24 0848 08/04/24 0000  WBC 7.5 6.8 6.0  NEUTROABS 4,508.00 4,162.00 3,390.00  HGB 11.9* 11.8* 11.4*  HCT 37  36 36  PLT 212 193 157   Lab Results  Component Value Date   TSH 3.49 08/11/2023   Lab Results  Component Value Date   HGBA1C 5.8 08/11/2023   Lab Results  Component Value Date   CHOL 139 06/11/2022   HDL 56 06/11/2022   LDLCALC 70 06/11/2022   TRIG 57 06/11/2022   CHOLHDL 2.5 06/11/2022    Significant Diagnostic Results in last 30 days:  CT Head Wo Contrast Result Date: 09/02/2024 EXAM: CT HEAD AND CERVICAL SPINE 09/02/2024 03:00:11 AM TECHNIQUE: CT of the head and cervical spine was performed without the administration of intravenous contrast. Multiplanar reformatted images are provided for review. Automated exposure control, iterative reconstruction, and/or weight based adjustment of the mA/kV was utilized to reduce the radiation dose to as low as reasonably achievable. COMPARISON: None available. CLINICAL HISTORY: Polytrauma, blunt. c/o of repeated falls, 4 in the past month, ; Declines blood thinners. Pt did hit her head, denies LOC or dizziness, swelling to left knee noted. FINDINGS: CT HEAD BRAIN AND VENTRICLES: No acute intracranial hemorrhage. No mass effect or midline shift. No abnormal extra-axial fluid collection. No evidence of acute infarct. No hydrocephalus. Confluent chronic ischemic white matter changes. ORBITS: No acute abnormality. SINUSES AND MASTOIDS: No acute abnormality. SOFT TISSUES AND SKULL: No acute skull fracture. No acute soft tissue abnormality. CT CERVICAL SPINE BONES AND ALIGNMENT: No acute fracture or traumatic malalignment. DEGENERATIVE CHANGES: Multilevel facet arthrosis. SOFT TISSUES: No prevertebral soft tissue swelling. IMPRESSION: 1. No acute intracranial abnormality. 2. No acute fracture or traumatic malalignment of the cervical spine. Electronically signed by: Franky Stanford MD 09/02/2024 03:28 AM EDT RP Workstation: [id number]   CT Cervical Spine Wo Contrast Result Date: 09/02/2024 EXAM: CT HEAD AND CERVICAL SPINE 09/02/2024 03:00:11 AM TECHNIQUE: CT of  the head and cervical spine was performed without the administration of intravenous contrast. Multiplanar reformatted images are provided for review. Automated exposure control, iterative reconstruction, and/or weight based adjustment of the mA/kV was utilized to reduce the radiation dose to as low as reasonably achievable. COMPARISON: None available. CLINICAL HISTORY: Polytrauma, blunt. c/o of repeated falls, 4 in the past month, ; Declines blood thinners. Pt did hit her head, denies LOC or dizziness, swelling to left knee noted. FINDINGS: CT HEAD BRAIN AND VENTRICLES: No acute intracranial hemorrhage. No mass effect or midline shift. No abnormal extra-axial fluid collection. No evidence of acute infarct. No hydrocephalus. Confluent chronic ischemic white matter changes. ORBITS: No acute abnormality. SINUSES AND MASTOIDS: No acute abnormality. SOFT TISSUES AND SKULL:  No acute skull fracture. No acute soft tissue abnormality. CT CERVICAL SPINE BONES AND ALIGNMENT: No acute fracture or traumatic malalignment. DEGENERATIVE CHANGES: Multilevel facet arthrosis. SOFT TISSUES: No prevertebral soft tissue swelling. IMPRESSION: 1. No acute intracranial abnormality. 2. No acute fracture or traumatic malalignment of the cervical spine. Electronically signed by: Franky Stanford MD 09/02/2024 03:28 AM EDT RP Workstation: [id number]   DG Knee Complete 4 Views Left Result Date: 09/02/2024 CLINICAL DATA:  Fall, left knee pain EXAM: LEFT KNEE - COMPLETE 4+ VIEW COMPARISON:  None Available. FINDINGS: No evidence of fracture, dislocation, or joint effusion. No evidence of arthropathy or other focal bone abnormality. Soft tissues are unremarkable. IMPRESSION: Negative. Electronically Signed   By: Dorethia Molt M.D.   On: 09/02/2024 02:34   DG HIP UNILAT WITH PELVIS 2-3 VIEWS LEFT Result Date: 09/02/2024 CLINICAL DATA:  Fall, pelvic pain EXAM: DG HIP (WITH OR WITHOUT PELVIS) 2-3V LEFT COMPARISON:  None Available. FINDINGS: There is  no evidence of hip fracture or dislocation. There is no evidence of arthropathy or other focal bone abnormality. IMPRESSION: Negative. Electronically Signed   By: Dorethia Molt M.D.   On: 09/02/2024 02:33   DG Chest Portable 1 View Result Date: 09/02/2024 CLINICAL DATA:  Fall EXAM: PORTABLE CHEST 1 VIEW COMPARISON:  04/02/2023 FINDINGS: Lungs volumes are small. Bibasilar atelectasis or infiltrate is present. No pneumothorax or pleural effusion. Cardiac size within normal limits given the degree of pulmonary hypoinflation. Pulmonary vascularity is normal. Osseous structures are age-appropriate. No acute bone abnormality. IMPRESSION: 1. Pulmonary hypoinflation. Bibasilar atelectasis or infiltrate. Inferior Electronically Signed   By: Dorethia Molt M.D.   On: 09/02/2024 02:32    Assessment/Plan: HTN (hypertension) Blood pressure is controlled,  taking Losartan , Metoprolol ,  ASA, Bun/creat 15/0.8 08/04/24  GERD (gastroesophageal reflux disease) Stable, on Omeprazole , Hgb 11.4 08/04/24  Hyperlipidemia  off Atorvastatin , LDL 70 06/11/22  Prediabetes  diet controlled, Hgb a1c 5.8 08/11/23  Iron deficiency anemia Iron 37 06/30/24, on Fe, Hgb 11.4 08/04/24    Insomnia secondary to anxiety Lorazepam  prn available to her.  Allergic to as SSRI, failed Cymbalta , BuSpar  Osteopenia after menopause  declined Alendronate  after initial consented.  Taking calcium  and vitamin D  Atrial flutter (HCC)  A-flutter 01/05/22, no recurrence, delay cardiology consultation  OSA (obstructive sleep apnea) CPAP, seeing pulmonology.   Gait abnormality ambulated with walker, risk of falling  Confusion seems better since off BuSpar, duloxetine , Lexapro. Obtain MMSE    Family/ staff Communication: Plan of care reviewed with the patient and charge nurse  Labs/tests ordered:  None

## 2024-09-20 NOTE — Assessment & Plan Note (Signed)
 seems better since off BuSpar, duloxetine , Lexapro. Obtain MMSE

## 2024-09-20 NOTE — Assessment & Plan Note (Signed)
 Lorazepam  prn available to her.  Allergic to as SSRI, failed Cymbalta , BuSpar.

## 2024-09-20 NOTE — Assessment & Plan Note (Signed)
 Stable, on Omeprazole , Hgb 11.4 08/04/24

## 2024-09-20 NOTE — Assessment & Plan Note (Signed)
 Blood pressure is controlled,  taking Losartan , Metoprolol ,  ASA, Bun/creat 15/0.8 08/04/24

## 2024-09-20 NOTE — Assessment & Plan Note (Signed)
 declined Alendronate  after initial consented.  Taking calcium  and vitamin D

## 2024-09-20 NOTE — Assessment & Plan Note (Signed)
 diet controlled, Hgb a1c 5.8 08/11/23

## 2024-09-20 NOTE — Assessment & Plan Note (Signed)
CPAP, seeing pulmonology.

## 2024-09-21 ENCOUNTER — Encounter: Payer: Self-pay | Admitting: Nurse Practitioner

## 2024-09-23 ENCOUNTER — Non-Acute Institutional Stay (SKILLED_NURSING_FACILITY): Payer: Self-pay | Admitting: Sports Medicine

## 2024-09-23 DIAGNOSIS — F0390 Unspecified dementia without behavioral disturbance: Secondary | ICD-10-CM

## 2024-09-23 DIAGNOSIS — R410 Disorientation, unspecified: Secondary | ICD-10-CM

## 2024-09-23 NOTE — Progress Notes (Signed)
 Provider:  Dr. Jackalyn Blazing Location:  [facility] Place of Service:   Memory care  PCP: Mast, Man X, NP Patient Care Team: Mast, Man X, NP as PCP - General (Internal Medicine) Mast, Man X, NP as Nurse Practitioner (Internal Medicine)  Extended Emergency Contact Information Primary Emergency Contact: [facility] Address: [address]          [place], [place] [address] [place] Home Phone: [phone] Mobile Phone: [phone] Relation: Son  Goals of Care: Advanced Directive information    09/12/2024   11:18 AM  Advanced Directives  Does Patient Have a Medical Advance Directive? Yes  Type of Advance Directive Out of facility DNR (pink MOST or yellow form);Living will  Does patient want to make changes to medical advance directive? No - Patient declined  Copy of Healthcare Power of Attorney in Chart? Yes - validated most recent copy scanned in chart (See row information)      No chief complaint on file.     History of Present Illness  89 yr old F with h/o Dementia, GAD, HTN, HLD is evaluated for acute visit   Seen for nursing report that she is slumped over in the chair  Went to see the patient, she is sitting in chair in the living room Opens her eyes on calling  her name When asked what's her name, she replied ' you know my name   Pt does not seem to be in distress Although does not stay awake and goes back to sleep  She does not participate in conversation     Past Medical History:  Diagnosis Date   GERD (gastroesophageal reflux disease)    Hx of cataract surgery    both eyes   Hyperglycemia    Hyperlipidemia    Hypertension    Lumbago 04/05/2016   Macular degeneration    ? which eye(s)   Obstructive sleep apnea    Osteoporosis, senile    Overactive bladder    Squamous cell skin cancer 2/16   left lower leg   Past Surgical History:  Procedure Laterality Date   ABDOMINAL HYSTERECTOMY  1960's   APPENDECTOMY  1960's    MOLE REMOVAL  2000   facial   TONSILLECTOMY      reports that she has never smoked. She has never used smokeless tobacco. She reports that she does not currently use alcohol. She reports that she does not use drugs. Social History   Socioeconomic History   Marital status: Widowed    Spouse name: Not on file   Number of children: Not on file   Years of education: Not on file   Highest education level: Not on file  Occupational History   Occupation: retired Scientist, research (physical sciences)  Tobacco Use   Smoking status: Never   Smokeless tobacco: Never  Vaping Use   Vaping status: Never Used  Substance and Sexual Activity   Alcohol use: Not Currently    Comment: occassionally 1/2 glass of wine 1-2 x month   Drug use: No   Sexual activity: Never  Other Topics Concern   Not on file  Social History Narrative   Lives at [facility] since 11/12/15   Widow   Never smoked   Alcohol occasionally 1/2 glass of wine   Exercise none   POA, Living Will   Walks with walker   Social Drivers of Health   Financial Resource Strain: Not on file  Food Insecurity: Not on file  Transportation Needs:  Not on file  Physical Activity: Not on file  Stress: Not on file  Social Connections: Not on file  Intimate Partner Violence: Not on file    Functional Status Survey:    Family History  Problem Relation Age of Onset   Heart disease Mother    Heart disease Father     Health Maintenance  Topic Date Due   COVID-19 Vaccine (8 - 2025-26 season) 08/29/2024   Medicare Annual Wellness (AWV)  10/26/2024   Influenza Vaccine  09/28/2024 (Originally 07/29/2024)   Zoster Vaccines- Shingrix (1 of 2) 12/29/2094 (Originally 01/23/1943)   DTaP/Tdap/Td (3 - Td or Tdap) 11/04/2033   Pneumococcal Vaccine: 50+ Years  Completed   DEXA SCAN  Completed   HPV VACCINES  Aged Out   Meningococcal B Vaccine  Aged Out    Allergies  Allergen Reactions   Adhesive [Tape]    Ampicillin    Elemental Sulfur     Nitrofuran Derivatives    Zoloft  [Sertraline  Hcl] Other (See Comments)    Dizziness, nausea, faint feeling, and nightmares      Outpatient Encounter Medications as of 09/23/2024  Medication Sig   acetaminophen  (TYLENOL ) 325 MG tablet Take 650 mg by mouth every 4 (four) hours as needed. 2 tablets by mouth every 4 hours as need for fever and pain   acetaminophen  (TYLENOL ) 500 MG tablet Take 500 mg by mouth daily.    aspirin  EC 81 MG tablet Take 1 tablet (81 mg total) by mouth daily.   Calcium  Carb-Cholecalciferol 600-20 MG-MCG TABS Take 1 tablet by mouth 2 (two) times daily.   ferrous sulfate  325 (65 FE) MG EC tablet Take 325 mg by mouth 3 (three) times a week. Monday, Wednesday, and Friday.   loperamide (IMODIUM A-D) 2 MG tablet Take 2 mg by mouth every 6 (six) hours as needed for diarrhea or loose stools.   LORazepam  (ATIVAN ) 0.5 MG tablet TAKE ONE TABLET ONCE DAILY AS NEEDED FOR ANXIETY.   losartan  (COZAAR ) 100 MG tablet Take 100 mg by mouth daily.   metoprolol  succinate (TOPROL -XL) 25 MG 24 hr tablet TAKE 1/2 TABLET ONCE A DAY.   Multiple Vitamins-Minerals (OCUVITE ADULT 50+) CAPS Take 1 capsule by mouth every evening.   omeprazole  (PRILOSEC) 40 MG capsule TAKE 1 CAPSULE DAILY 1/2 HOUR BEFORE BREAKFAST FOR STOMACH ACID.   Polyethyl Glycol-Propyl Glycol (SYSTANE) 0.4-0.3 % GEL ophthalmic gel Place 1 application into both eyes daily.   TRIAMCINOLONE ACETONIDE,NASAL, NA Place 2 sprays into the nose daily. Each nostril   No facility-administered encounter medications on file as of 09/23/2024.    Review of Systems  Constitutional:  Positive for activity change. Negative for fever.  Respiratory:  Negative for cough and shortness of breath.   Gastrointestinal:  Negative for blood in stool, diarrhea and vomiting.  Psychiatric/Behavioral:  Positive for confusion.    Negative unless indicated in HPI.  There were no vitals filed for this visit. There is no height or weight on file to calculate  BMI. BP Readings from Last 3 Encounters:  09/20/24 (!) 152/82  09/12/24 (!) 173/78  09/09/24 (!) 142/72   Wt Readings from Last 3 Encounters:  09/20/24 153 lb 14.4 oz (69.8 kg)  09/12/24 163 lb 3.2 oz (74 kg)  09/09/24 163 lb 3.2 oz (74 kg)   Physical Exam HENT:     Head: Normocephalic and atraumatic.  Cardiovascular:     Rate and Rhythm: Normal rate and regular rhythm.  Pulmonary:     Effort:  Pulmonary effort is normal. No respiratory distress.     Breath sounds: Normal breath sounds. No wheezing.  Abdominal:     General: Bowel sounds are normal. There is no distension.     Tenderness: There is no abdominal tenderness. There is no guarding or rebound.     Comments:    Neurological:     Comments: Does not follow commands      Labs reviewed: Basic Metabolic Panel: Recent Labs    04/07/24 0000 07/09/24 0848 08/04/24 0000  NA 139 138 138  K 3.8 4.1 3.8  CL 105 107 106  CO2 25* 19 29*  BUN 21 18 15   CREATININE 1.0 0.8 0.8  CALCIUM  9.3 10.0 9.4   Liver Function Tests: Recent Labs    03/29/24 0000 04/07/24 0000 07/09/24 0848 08/04/24 0000  AST 19 21 23 18   ALT 13 15 14 12   ALKPHOS 73 83 72 66  ALBUMIN 4.1  --  4.4 3.8   No results for input(s): LIPASE, AMYLASE in the last 8760 hours. No results for input(s): AMMONIA in the last 8760 hours. CBC: Recent Labs    06/30/24 0000 07/09/24 0848 08/04/24 0000  WBC 7.5 6.8 6.0  NEUTROABS 4,508.00 4,162.00 3,390.00  HGB 11.9* 11.8* 11.4*  HCT 37 36 36  PLT 212 193 157   Cardiac Enzymes: No results for input(s): CKTOTAL, CKMB, CKMBINDEX, TROPONINI in the last 8760 hours. BNP: Invalid input(s): POCBNP Lab Results  Component Value Date   HGBA1C 5.8 08/11/2023   Lab Results  Component Value Date   TSH 3.49 08/11/2023   Lab Results  Component Value Date   VITAMINB12 280 03/29/2024   Lab Results  Component Value Date   FOLATE 13.5 07/20/2017   Lab Results  Component Value Date   IRON  37 06/30/2024   TIBC 306 06/30/2024   FERRITIN 12 06/30/2024    Imaging and Procedures obtained prior to SNF admission: CT Head Wo Contrast Result Date: 09/02/2024 EXAM: CT HEAD AND CERVICAL SPINE 09/02/2024 03:00:11 AM TECHNIQUE: CT of the head and cervical spine was performed without the administration of intravenous contrast. Multiplanar reformatted images are provided for review. Automated exposure control, iterative reconstruction, and/or weight based adjustment of the mA/kV was utilized to reduce the radiation dose to as low as reasonably achievable. COMPARISON: None available. CLINICAL HISTORY: Polytrauma, blunt. c/o of repeated falls, 4 in the past month, ; Declines blood thinners. Pt did hit her head, denies LOC or dizziness, swelling to left knee noted. FINDINGS: CT HEAD BRAIN AND VENTRICLES: No acute intracranial hemorrhage. No mass effect or midline shift. No abnormal extra-axial fluid collection. No evidence of acute infarct. No hydrocephalus. Confluent chronic ischemic white matter changes. ORBITS: No acute abnormality. SINUSES AND MASTOIDS: No acute abnormality. SOFT TISSUES AND SKULL: No acute skull fracture. No acute soft tissue abnormality. CT CERVICAL SPINE BONES AND ALIGNMENT: No acute fracture or traumatic malalignment. DEGENERATIVE CHANGES: Multilevel facet arthrosis. SOFT TISSUES: No prevertebral soft tissue swelling. IMPRESSION: 1. No acute intracranial abnormality. 2. No acute fracture or traumatic malalignment of the cervical spine. Electronically signed by: Franky Stanford MD 09/02/2024 03:28 AM EDT RP Workstation: [id number]   CT Cervical Spine Wo Contrast Result Date: 09/02/2024 EXAM: CT HEAD AND CERVICAL SPINE 09/02/2024 03:00:11 AM TECHNIQUE: CT of the head and cervical spine was performed without the administration of intravenous contrast. Multiplanar reformatted images are provided for review. Automated exposure control, iterative reconstruction, and/or weight based adjustment  of the mA/kV was utilized to  reduce the radiation dose to as low as reasonably achievable. COMPARISON: None available. CLINICAL HISTORY: Polytrauma, blunt. c/o of repeated falls, 4 in the past month, ; Declines blood thinners. Pt did hit her head, denies LOC or dizziness, swelling to left knee noted. FINDINGS: CT HEAD BRAIN AND VENTRICLES: No acute intracranial hemorrhage. No mass effect or midline shift. No abnormal extra-axial fluid collection. No evidence of acute infarct. No hydrocephalus. Confluent chronic ischemic white matter changes. ORBITS: No acute abnormality. SINUSES AND MASTOIDS: No acute abnormality. SOFT TISSUES AND SKULL: No acute skull fracture. No acute soft tissue abnormality. CT CERVICAL SPINE BONES AND ALIGNMENT: No acute fracture or traumatic malalignment. DEGENERATIVE CHANGES: Multilevel facet arthrosis. SOFT TISSUES: No prevertebral soft tissue swelling. IMPRESSION: 1. No acute intracranial abnormality. 2. No acute fracture or traumatic malalignment of the cervical spine. Electronically signed by: Franky Stanford MD 09/02/2024 03:28 AM EDT RP Workstation: [id number]   DG Knee Complete 4 Views Left Result Date: 09/02/2024 CLINICAL DATA:  Fall, left knee pain EXAM: LEFT KNEE - COMPLETE 4+ VIEW COMPARISON:  None Available. FINDINGS: No evidence of fracture, dislocation, or joint effusion. No evidence of arthropathy or other focal bone abnormality. Soft tissues are unremarkable. IMPRESSION: Negative. Electronically Signed   By: Dorethia Molt M.D.   On: 09/02/2024 02:34   DG HIP UNILAT WITH PELVIS 2-3 VIEWS LEFT Result Date: 09/02/2024 CLINICAL DATA:  Fall, pelvic pain EXAM: DG HIP (WITH OR WITHOUT PELVIS) 2-3V LEFT COMPARISON:  None Available. FINDINGS: There is no evidence of hip fracture or dislocation. There is no evidence of arthropathy or other focal bone abnormality. IMPRESSION: Negative. Electronically Signed   By: Dorethia Molt M.D.   On: 09/02/2024 02:33   DG Chest Portable 1  View Result Date: 09/02/2024 CLINICAL DATA:  Fall EXAM: PORTABLE CHEST 1 VIEW COMPARISON:  04/02/2023 FINDINGS: Lungs volumes are small. Bibasilar atelectasis or infiltrate is present. No pneumothorax or pleural effusion. Cardiac size within normal limits given the degree of pulmonary hypoinflation. Pulmonary vascularity is normal. Osseous structures are age-appropriate. No acute bone abnormality. IMPRESSION: 1. Pulmonary hypoinflation. Bibasilar atelectasis or infiltrate. Inferior Electronically Signed   By: Dorethia Molt M.D.   On: 09/02/2024 02:32    Assessment and Plan Assessment & Plan   1. Major neurocognitive disorder (HCC) (Primary) 2. Confusion Went to see the patient for nursing report that she is not responding Pt opened her eyes on calling her name, Staff reported that she got up after I left and walked to her room with the help of walker  Neuro checks  every shift  Will get labs  HTN  Cont with losartan   Monitor vitals

## 2024-09-24 DIAGNOSIS — R4182 Altered mental status, unspecified: Secondary | ICD-10-CM | POA: Diagnosis not present

## 2024-09-24 DIAGNOSIS — N1831 Chronic kidney disease, stage 3a: Secondary | ICD-10-CM | POA: Diagnosis not present

## 2024-09-24 DIAGNOSIS — D509 Iron deficiency anemia, unspecified: Secondary | ICD-10-CM | POA: Diagnosis not present

## 2024-09-24 DIAGNOSIS — I10 Essential (primary) hypertension: Secondary | ICD-10-CM | POA: Diagnosis not present

## 2024-09-24 LAB — BASIC METABOLIC PANEL WITH GFR
BUN: 22 — AB (ref 4–21)
CO2: 28 — AB (ref 13–22)
Chloride: 109 — AB (ref 99–108)
Creatinine: 0.8 (ref 0.5–1.1)
Glucose: 93
Potassium: 3.5 meq/L (ref 3.5–5.1)
Sodium: 144 (ref 137–147)

## 2024-09-24 LAB — CBC AND DIFFERENTIAL
HCT: 34 — AB (ref 36–46)
Hemoglobin: 10.8 — AB (ref 12.0–16.0)
Platelets: 208 K/uL (ref 150–400)
WBC: 5.5

## 2024-09-24 LAB — CBC: RBC: 3.48 — AB (ref 3.87–5.11)

## 2024-09-24 LAB — COMPREHENSIVE METABOLIC PANEL WITH GFR
Calcium: 9 (ref 8.7–10.7)
eGFR: 65

## 2024-09-27 ENCOUNTER — Encounter: Payer: Self-pay | Admitting: Nurse Practitioner

## 2024-09-27 ENCOUNTER — Non-Acute Institutional Stay (SKILLED_NURSING_FACILITY): Payer: Self-pay | Admitting: Nurse Practitioner

## 2024-09-27 DIAGNOSIS — K219 Gastro-esophageal reflux disease without esophagitis: Secondary | ICD-10-CM

## 2024-09-27 DIAGNOSIS — I10 Essential (primary) hypertension: Secondary | ICD-10-CM | POA: Diagnosis not present

## 2024-09-27 DIAGNOSIS — R269 Unspecified abnormalities of gait and mobility: Secondary | ICD-10-CM

## 2024-09-27 DIAGNOSIS — R410 Disorientation, unspecified: Secondary | ICD-10-CM | POA: Diagnosis not present

## 2024-09-27 DIAGNOSIS — F419 Anxiety disorder, unspecified: Secondary | ICD-10-CM

## 2024-09-27 DIAGNOSIS — D509 Iron deficiency anemia, unspecified: Secondary | ICD-10-CM

## 2024-09-27 NOTE — Assessment & Plan Note (Signed)
 off Fe-simplify meds, Hgb 10.8 09/24/2024

## 2024-09-27 NOTE — Progress Notes (Signed)
 Location:   SNF [facility] Room Number: 102 Place of Service:  SNF (31) Provider: Larwance Iliyah Bui NP  Odell Choung X, NP  Patient Care Team: Hanan Mcwilliams X, NP as PCP - General (Internal Medicine) Chrishelle Zito X, NP as Nurse Practitioner (Internal Medicine)  Extended Emergency Contact Information Primary Emergency Contact: [facility] Address: [address]          [place], [place] [address] [place] Home Phone: [phone] Mobile Phone: [phone] Relation: Son  Code Status:  DNR Goals of care: Advanced Directive information    09/12/2024   11:18 AM  Advanced Directives  Does Patient Have a Medical Advance Directive? Yes  Type of Advance Directive Out of facility DNR (pink MOST or yellow form);Living will  Does patient want to make changes to medical advance directive? No - Patient declined  Copy of Healthcare Power of Attorney in Chart? Yes - validated most recent copy scanned in chart (See row information)     Chief Complaint  Patient presents with   Medical Management of Chronic Issues    HPI:  Pt is a 89 y.o. female seen today for medical management of chronic diseases.     Intermittent confusion, seems better since off BuSpar, duloxetine , Lexapro.  Staff reported the patient has been refusing meals, boost, Magic cup, medications, vital signs, ADL assistance.  The patient appears mild anxious, she stated she sleeps and eats, but did complain of staff administering all her pills together which is not the way she was used to at home for herself.  The patient was noted to get out of her room and participate in group activities on the unit.             Gait abnormality, ambulated with walker, risk of falling             Hx of near syncope: not new, no orthostatic hypotension, last episode of dizziness 12/10/23. 09/02/24 CT head: Confluent chronic ischemic white matter changes.              Edema BLE, not apparent, TED             OSA, CPAP, seeing pulmonology.               A-flutter 01/05/22, no recurrence, delay cardiology consultation OP declined Alendronate  after initial consented.  Taking calcium  and vitamin D             Bone spur R foot, callus of foot, f/u podiatrist.              Allergic rhinitis, on nasal spray             Anxiety, Lorazepam  prn available to her.  Allergic to as SSRI, failed Cymbalta , BuSpar. The patient's son stated barbiturates worked for him.              Elevated TSH 5.07 06/11/22, TSH 5.11 02/10/23, TSH 3.49 08/11/23,  not on supplement.             HTN, taking Losartan , Metoprolol ,  off ASA due to simplify meds, Bun/creat 22/0.82 09/24/2024             GERD, stable on Omeprazole              Hyperlipidemia, off Atorvastatin , LDL 70 06/11/22             Prediabetes, diet controlled, Hgb a1c 5.8 08/11/23  Anemia, Iron 37 06/30/24, off Fe-simplify meds, Hgb 10.8 09/24/2024       Past Medical History:  Diagnosis Date   GERD (gastroesophageal reflux disease)    Hx of cataract surgery    both eyes   Hyperglycemia    Hyperlipidemia    Hypertension    Lumbago 04/05/2016   Macular degeneration    ? which eye(s)   Obstructive sleep apnea    Osteoporosis, senile    Overactive bladder    Squamous cell skin cancer 2/16   left lower leg   Past Surgical History:  Procedure Laterality Date   ABDOMINAL HYSTERECTOMY  1960's   APPENDECTOMY  1960's   MOLE REMOVAL  2000   facial   TONSILLECTOMY      Allergies  Allergen Reactions   Adhesive [Tape]    Ampicillin    Elemental Sulfur    Nitrofuran Derivatives    Zoloft  [Sertraline  Hcl] Other (See Comments)    Dizziness, nausea, faint feeling, and nightmares      Allergies as of 09/27/2024       Reactions   Adhesive [tape]    Ampicillin    Elemental Sulfur    Nitrofuran Derivatives    Zoloft  [sertraline  Hcl] Other (See Comments)   Dizziness, nausea, faint feeling, and nightmares          Medication List        Accurate as of September 27, 2024  1:07 PM. If  you have any questions, ask your nurse or doctor.          acetaminophen  500 MG tablet Commonly known as: TYLENOL  Take 500 mg by mouth daily.   acetaminophen  325 MG tablet Commonly known as: TYLENOL  Take 650 mg by mouth every 4 (four) hours as needed. 2 tablets by mouth every 4 hours as need for fever and pain   aspirin  EC 81 MG tablet Take 1 tablet (81 mg total) by mouth daily.   Calcium  Carb-Cholecalciferol 600-20 MG-MCG Tabs Take 1 tablet by mouth 2 (two) times daily.   ferrous sulfate  325 (65 FE) MG EC tablet Take 325 mg by mouth 3 (three) times a week. Monday, Wednesday, and Friday.   loperamide 2 MG tablet Commonly known as: IMODIUM A-D Take 2 mg by mouth every 6 (six) hours as needed for diarrhea or loose stools.   LORazepam  0.5 MG tablet Commonly known as: ATIVAN  TAKE ONE TABLET ONCE DAILY AS NEEDED FOR ANXIETY.   losartan  100 MG tablet Commonly known as: COZAAR  Take 100 mg by mouth daily.   metoprolol  succinate 25 MG 24 hr tablet Commonly known as: TOPROL -XL TAKE 1/2 TABLET ONCE A DAY.   Ocuvite Adult 50+ Caps Take 1 capsule by mouth every evening.   omeprazole  40 MG capsule Commonly known as: PRILOSEC TAKE 1 CAPSULE DAILY 1/2 HOUR BEFORE BREAKFAST FOR STOMACH ACID.   Systane 0.4-0.3 % Gel ophthalmic gel Generic drug: Polyethyl Glycol-Propyl Glycol Place 1 application into both eyes daily.   TRIAMCINOLONE ACETONIDE(NASAL) NA Place 2 sprays into the nose daily. Each nostril        Review of Systems  Constitutional:  Negative for appetite change, fatigue and fever.  HENT:  Positive for rhinorrhea. Negative for congestion, hearing loss, sore throat and trouble swallowing.   Eyes:  Negative for visual disturbance.  Respiratory:  Negative for cough and shortness of breath.   Cardiovascular:  Negative for leg swelling.  Gastrointestinal:  Negative for abdominal pain and constipation.  Genitourinary:  Negative for dysuria and  urgency.       Worsened  from occasionally urinary leakage to frequent urinary incontinence even if more at night.   Musculoskeletal:  Positive for arthralgias, back pain, gait problem and neck pain.  Skin:  Negative for color change.  Neurological:  Negative for speech difficulty, weakness and light-headedness.       Tingling in toes sometimes, better after moves around.  Tingling and numbness in the right shin 04/06/24 resolved after rest and elevated leg.   Psychiatric/Behavioral:  Positive for confusion. Negative for sleep disturbance. The patient is nervous/anxious.        Intermittent     Immunization History  Administered Date(s) Administered   Fluad Quad(high Dose 65+) 10/12/2019, 10/14/2022   INFLUENZA, HIGH DOSE SEASONAL PF 10/07/2017, 09/28/2021   Influenza Whole 09/30/2018   Influenza-Unspecified 09/27/2015, 10/09/2016, 10/10/2020, 09/30/2023   Moderna Covid-19 Fall Seasonal Vaccine 50yrs & older 10/14/2023   Moderna Covid-19 Vaccine Bivalent Booster 93yrs & up 10/30/2022   Moderna SARS-COV2 Booster Vaccination 10/14/2023   Moderna Sars-Covid-2 Vaccination 01/02/2020, 01/30/2020, 11/06/2020, 05/28/2021, 05/16/2022   PFIZER(Purple Top)SARS-COV-2 Vaccination 09/17/2021   Pfizer Fall 2024 Covid-19 Vaccine 6mos thru 62yrs 05/16/2022   Pneumococcal Conjugate-13 01/23/2015   Pneumococcal Polysaccharide-23 10/20/2007   Respiratory Syncytial Virus Vaccine,Recomb Aduvanted(Arexvy) 12/26/2022   Td 12/14/2012   Tdap 11/05/2023   Pertinent  Health Maintenance Due  Topic Date Due   Influenza Vaccine  09/28/2024 (Originally 07/29/2024)   DEXA SCAN  Completed      03/05/2023    2:48 PM 03/12/2023    3:32 PM 04/23/2023    1:49 PM 05/20/2023    2:12 PM 08/14/2023   12:41 PM  Fall Risk  Falls in the past year? 0 0 0 0 0  Was there an injury with Fall? 0 0 0 0 0  Fall Risk Category Calculator 0 0 0 0 0  Patient at Risk for Falls Due to No Fall Risks Impaired balance/gait;Impaired mobility History of fall(s) History  of fall(s)   Fall risk Follow up Falls evaluation completed Falls evaluation completed Falls evaluation completed Falls evaluation completed Falls evaluation completed   Functional Status Survey:    Vitals:   09/27/24 1241  BP: 129/75  Pulse: 84  Resp: 18  Temp: (!) 97.5 F (36.4 C)  SpO2: 97%  Weight: 153 lb 14.4 oz (69.8 kg)   Body mass index is 24.1 kg/m. Physical Exam Vitals and nursing note reviewed.  Constitutional:      Appearance: Normal appearance.  HENT:     Head: Normocephalic and atraumatic.     Mouth/Throat:     Mouth: Mucous membranes are moist.  Eyes:     Extraocular Movements: Extraocular movements intact.     Conjunctiva/sclera: Conjunctivae normal.     Pupils: Pupils are equal, round, and reactive to light.  Cardiovascular:     Rate and Rhythm: Normal rate and regular rhythm.     Heart sounds: No murmur heard. Pulmonary:     Effort: Pulmonary effort is normal.     Breath sounds: Normal breath sounds. No rales.  Abdominal:     General: Bowel sounds are normal.     Palpations: Abdomen is soft.     Tenderness: There is no abdominal tenderness.  Musculoskeletal:        General: No tenderness.     Cervical back: Normal range of motion and neck supple.     Right lower leg: No edema.     Left lower leg: No edema.  Comments: Chronic neck,  middle back, and lower back pain.  No spinal spinous process tenderness palpated  Skin:    General: Skin is warm and dry.  Neurological:     General: No focal deficit present.     Mental Status: She is alert and oriented to person, place, and time. Mental status is at baseline.     Gait: Gait abnormal.  Psychiatric:        Mood and Affect: Mood normal.        Behavior: Behavior normal.        Thought Content: Thought content normal.     Labs reviewed: Recent Labs    04/07/24 0000 07/09/24 0848 08/04/24 0000  NA 139 138 138  K 3.8 4.1 3.8  CL 105 107 106  CO2 25* 19 29*  BUN 21 18 15   CREATININE 1.0  0.8 0.8  CALCIUM  9.3 10.0 9.4   Recent Labs    03/29/24 0000 04/07/24 0000 07/09/24 0848 08/04/24 0000  AST 19 21 23 18   ALT 13 15 14 12   ALKPHOS 73 83 72 66  ALBUMIN 4.1  --  4.4 3.8   Recent Labs    06/30/24 0000 07/09/24 0848 08/04/24 0000  WBC 7.5 6.8 6.0  NEUTROABS 4,508.00 4,162.00 3,390.00  HGB 11.9* 11.8* 11.4*  HCT 37 36 36  PLT 212 193 157   Lab Results  Component Value Date   TSH 3.49 08/11/2023   Lab Results  Component Value Date   HGBA1C 5.8 08/11/2023   Lab Results  Component Value Date   CHOL 139 06/11/2022   HDL 56 06/11/2022   LDLCALC 70 06/11/2022   TRIG 57 06/11/2022   CHOLHDL 2.5 06/11/2022    Significant Diagnostic Results in last 30 days:  CT Head Wo Contrast Result Date: 09/02/2024 EXAM: CT HEAD AND CERVICAL SPINE 09/02/2024 03:00:11 AM TECHNIQUE: CT of the head and cervical spine was performed without the administration of intravenous contrast. Multiplanar reformatted images are provided for review. Automated exposure control, iterative reconstruction, and/or weight based adjustment of the mA/kV was utilized to reduce the radiation dose to as low as reasonably achievable. COMPARISON: None available. CLINICAL HISTORY: Polytrauma, blunt. c/o of repeated falls, 4 in the past month, ; Declines blood thinners. Pt did hit her head, denies LOC or dizziness, swelling to left knee noted. FINDINGS: CT HEAD BRAIN AND VENTRICLES: No acute intracranial hemorrhage. No mass effect or midline shift. No abnormal extra-axial fluid collection. No evidence of acute infarct. No hydrocephalus. Confluent chronic ischemic white matter changes. ORBITS: No acute abnormality. SINUSES AND MASTOIDS: No acute abnormality. SOFT TISSUES AND SKULL: No acute skull fracture. No acute soft tissue abnormality. CT CERVICAL SPINE BONES AND ALIGNMENT: No acute fracture or traumatic malalignment. DEGENERATIVE CHANGES: Multilevel facet arthrosis. SOFT TISSUES: No prevertebral soft tissue  swelling. IMPRESSION: 1. No acute intracranial abnormality. 2. No acute fracture or traumatic malalignment of the cervical spine. Electronically signed by: Franky Stanford MD 09/02/2024 03:28 AM EDT RP Workstation: [id number]   CT Cervical Spine Wo Contrast Result Date: 09/02/2024 EXAM: CT HEAD AND CERVICAL SPINE 09/02/2024 03:00:11 AM TECHNIQUE: CT of the head and cervical spine was performed without the administration of intravenous contrast. Multiplanar reformatted images are provided for review. Automated exposure control, iterative reconstruction, and/or weight based adjustment of the mA/kV was utilized to reduce the radiation dose to as low as reasonably achievable. COMPARISON: None available. CLINICAL HISTORY: Polytrauma, blunt. c/o of repeated falls, 4 in the past month, ; Declines  blood thinners. Pt did hit her head, denies LOC or dizziness, swelling to left knee noted. FINDINGS: CT HEAD BRAIN AND VENTRICLES: No acute intracranial hemorrhage. No mass effect or midline shift. No abnormal extra-axial fluid collection. No evidence of acute infarct. No hydrocephalus. Confluent chronic ischemic white matter changes. ORBITS: No acute abnormality. SINUSES AND MASTOIDS: No acute abnormality. SOFT TISSUES AND SKULL: No acute skull fracture. No acute soft tissue abnormality. CT CERVICAL SPINE BONES AND ALIGNMENT: No acute fracture or traumatic malalignment. DEGENERATIVE CHANGES: Multilevel facet arthrosis. SOFT TISSUES: No prevertebral soft tissue swelling. IMPRESSION: 1. No acute intracranial abnormality. 2. No acute fracture or traumatic malalignment of the cervical spine. Electronically signed by: Franky Stanford MD 09/02/2024 03:28 AM EDT RP Workstation: [id number]   DG Knee Complete 4 Views Left Result Date: 09/02/2024 CLINICAL DATA:  Fall, left knee pain EXAM: LEFT KNEE - COMPLETE 4+ VIEW COMPARISON:  None Available. FINDINGS: No evidence of fracture, dislocation, or joint effusion. No evidence of arthropathy or  other focal bone abnormality. Soft tissues are unremarkable. IMPRESSION: Negative. Electronically Signed   By: Dorethia Molt M.D.   On: 09/02/2024 02:34   DG HIP UNILAT WITH PELVIS 2-3 VIEWS LEFT Result Date: 09/02/2024 CLINICAL DATA:  Fall, pelvic pain EXAM: DG HIP (WITH OR WITHOUT PELVIS) 2-3V LEFT COMPARISON:  None Available. FINDINGS: There is no evidence of hip fracture or dislocation. There is no evidence of arthropathy or other focal bone abnormality. IMPRESSION: Negative. Electronically Signed   By: Dorethia Molt M.D.   On: 09/02/2024 02:33   DG Chest Portable 1 View Result Date: 09/02/2024 CLINICAL DATA:  Fall EXAM: PORTABLE CHEST 1 VIEW COMPARISON:  04/02/2023 FINDINGS: Lungs volumes are small. Bibasilar atelectasis or infiltrate is present. No pneumothorax or pleural effusion. Cardiac size within normal limits given the degree of pulmonary hypoinflation. Pulmonary vascularity is normal. Osseous structures are age-appropriate. No acute bone abnormality. IMPRESSION: 1. Pulmonary hypoinflation. Bibasilar atelectasis or infiltrate. Inferior Electronically Signed   By: Dorethia Molt M.D.   On: 09/02/2024 02:32    Assessment/Plan  Confusion Intermittent confusion, seems better since off BuSpar, duloxetine , Lexapro.  Staff reported the patient has been refusing meals, boost, Magic cup, medications, vital signs, ADL assistance.  The patient appears mild anxious, she stated she sleeps and eats, but did complain of staff administering all her pills together which is not the way she was used to at home for herself.  The patient was noted to get out of her room and participate in group activities on the unit. Adjusting to memory care unit [facility] Will simplify medications and rescheduling: omeprazole  before breakfast, losartan  at 8 AM, metoprolol  hs, lorazepam  at bedtime, continue artificial tears and a nasal spray.  Discontinue Tylenol , iron, aspirin , multiple vitamins, Imodium, Ca+D ADL assistance and  supplements as tolerated Observe the patient  Gait abnormality ambulated with walker, risk of falling  Anxiety Lorazepam  prn available to her.  Allergic to as SSRI, failed Cymbalta , BuSpar. The patient's son stated barbiturates worked for him.  HTN (hypertension) taking Losartan , Metoprolol ,  off ASA due to simplify meds, Bun/creat 22/0.82 09/24/2024  GERD (gastroesophageal reflux disease)  stable on Omeprazole   Iron deficiency anemia off Fe-simplify meds, Hgb 10.8 09/24/2024   Family/ staff Communication: Plan of care reviewed with the patient, the patient's H POA-son, and charge nurse  Labs/tests ordered: None

## 2024-09-27 NOTE — Assessment & Plan Note (Signed)
stable on Omeprazole     

## 2024-09-27 NOTE — Assessment & Plan Note (Addendum)
 Intermittent confusion, seems better since off BuSpar, duloxetine , Lexapro.  Staff reported the patient has been refusing meals, boost, Magic cup, medications, vital signs, ADL assistance.  The patient appears mild anxious, she stated she sleeps and eats, but did complain of staff administering all her pills together which is not the way she was used to at home for herself.  The patient was noted to get out of her room and participate in group activities on the unit. Adjusting to memory care unit [facility] Will simplify medications and rescheduling: omeprazole  before breakfast, losartan  at 8 AM, metoprolol  hs, lorazepam  at bedtime, continue artificial tears and a nasal spray.  Discontinue Tylenol , iron, aspirin , multiple vitamins, Imodium, Ca+D ADL assistance and supplements as tolerated Observe the patient

## 2024-09-27 NOTE — Assessment & Plan Note (Signed)
 Lorazepam  prn available to her.  Allergic to as SSRI, failed Cymbalta , BuSpar. The patient's son stated barbiturates worked for him.

## 2024-09-27 NOTE — Assessment & Plan Note (Signed)
 taking Losartan , Metoprolol ,  off ASA due to simplify meds, Bun/creat 22/0.82 09/24/2024

## 2024-09-27 NOTE — Assessment & Plan Note (Signed)
 ambulated with walker, risk of falling

## 2024-09-29 ENCOUNTER — Encounter: Payer: Self-pay | Admitting: Sports Medicine

## 2024-11-22 ENCOUNTER — Non-Acute Institutional Stay (SKILLED_NURSING_FACILITY): Payer: Self-pay | Admitting: Nurse Practitioner

## 2024-11-22 ENCOUNTER — Encounter: Payer: Self-pay | Admitting: Nurse Practitioner

## 2024-11-22 DIAGNOSIS — F419 Anxiety disorder, unspecified: Secondary | ICD-10-CM

## 2024-11-22 DIAGNOSIS — K219 Gastro-esophageal reflux disease without esophagitis: Secondary | ICD-10-CM | POA: Diagnosis not present

## 2024-11-22 DIAGNOSIS — I10 Essential (primary) hypertension: Secondary | ICD-10-CM

## 2024-11-22 DIAGNOSIS — R269 Unspecified abnormalities of gait and mobility: Secondary | ICD-10-CM

## 2024-11-22 DIAGNOSIS — R4189 Other symptoms and signs involving cognitive functions and awareness: Secondary | ICD-10-CM | POA: Diagnosis not present

## 2024-11-22 NOTE — Assessment & Plan Note (Signed)
 Permissive Bp control, taking Losartan , Metoprolol ,  off ASA due to simplify meds, Bun/creat 22/0.82 09/24/2024

## 2024-11-22 NOTE — Assessment & Plan Note (Signed)
stable on Omeprazole     

## 2024-11-22 NOTE — Assessment & Plan Note (Signed)
 failed BuSpar, duloxetine , Lexapro.  Adjusted to Memory care unit well.

## 2024-11-22 NOTE — Assessment & Plan Note (Signed)
 ambulated with walker, risk of falling

## 2024-11-22 NOTE — Progress Notes (Signed)
 Location:  [facility] Room Number: N102-A Place of Service:  SNF (31) Provider:  Tamiah Dysart X, NP  Patient Care Team: Dwayne Bulkley X, NP as PCP - General (Internal Medicine) Oliviagrace Crisanti X, NP as Nurse Practitioner (Internal Medicine)  Extended Emergency Contact Information Primary Emergency Contact: [facility] Address: [address]          [place], [place] [address] [place] Home Phone: [phone] Mobile Phone: [phone] Relation: Son  Code Status:  DNR Goals of care: Advanced Directive information    11/22/2024    1:42 PM  Advanced Directives  Does Patient Have a Medical Advance Directive? Yes  Type of Estate Agent of [place];Living will;Out of facility DNR (pink MOST or yellow form)  Does patient want to make changes to medical advance directive? No - Patient declined  Copy of Healthcare Power of Attorney in Chart? Yes - validated most recent copy scanned in chart (See row information)  Pre-existing out of facility DNR order (yellow form or pink MOST form) Pink MOST form placed in chart (order not valid for inpatient use);Yellow form placed in chart (order not valid for inpatient use)     Chief Complaint  Patient presents with   Medical Management of Chronic Issues    Routine visit, needs to discuss medicare annual wellness visit    HPI:  Pt is a 89 y.o. female seen today for medical management of chronic diseases.      Dementia, failed BuSpar, duloxetine , Lexapro.  Adjusted to Memory care unit well.              Gait abnormality, ambulated with walker, risk of falling             Hx of near syncope: not new, no orthostatic hypotension, last episode of dizziness 12/10/23. 09/02/24 CT head: Confluent chronic ischemic white matter changes.              Edema BLE, not apparent, TED             OSA, CPAP, seeing pulmonology.              A-flutter 01/05/22, no recurrence, delay cardiology consultation OP declined  Alendronate  after initial consented.  Taking calcium  and vitamin D             Bone spur R foot, callus of foot, f/u podiatrist.              Allergic rhinitis, on nasal spray             Anxiety, Lorazepam  prn available to her.  Allergic to as SSRI, failed Cymbalta , BuSpar. The patient's son stated barbiturates worked for him.              Elevated TSH 5.07 06/11/22, TSH 5.11 02/10/23, TSH 3.49 08/11/23,  not on supplement.             HTN, taking Losartan , Metoprolol ,  off ASA due to simplify meds, Bun/creat 22/0.82 09/24/2024             GERD, stable on Omeprazole              Hyperlipidemia, off Atorvastatin , LDL 70 06/11/22             Prediabetes, diet controlled, Hgb a1c 5.8 08/11/23             Anemia, Iron 37 06/30/24, off Fe-simplify meds, Hgb 10.8 09/24/2024    Past Medical History:  Diagnosis  Date   GERD (gastroesophageal reflux disease)    Hx of cataract surgery    both eyes   Hyperglycemia    Hyperlipidemia    Hypertension    Lumbago 04/05/2016   Macular degeneration    ? which eye(s)   Obstructive sleep apnea    Osteoporosis, senile    Overactive bladder    Squamous cell skin cancer 2/16   left lower leg   Past Surgical History:  Procedure Laterality Date   ABDOMINAL HYSTERECTOMY  1960's   APPENDECTOMY  1960's   MOLE REMOVAL  2000   facial   TONSILLECTOMY      Allergies  Allergen Reactions   Adhesive [Tape]    Ampicillin    Elemental Sulfur    Nitrofuran Derivatives    Zoloft  [Sertraline  Hcl] Other (See Comments)    Dizziness, nausea, faint feeling, and nightmares      Outpatient Encounter Medications as of 11/22/2024  Medication Sig   acetaminophen  (TYLENOL ) 325 MG tablet Take 650 mg by mouth every 4 (four) hours as needed. 2 tablets by mouth every 4 hours as need for fever and pain   LORazepam  (ATIVAN ) 0.5 MG tablet TAKE ONE TABLET ONCE DAILY AS NEEDED FOR ANXIETY.   losartan  (COZAAR ) 100 MG tablet Take 100 mg by mouth daily.   metoprolol  succinate  (TOPROL -XL) 25 MG 24 hr tablet TAKE 1/2 TABLET ONCE A DAY.   omeprazole  (PRILOSEC) 40 MG capsule TAKE 1 CAPSULE DAILY 1/2 HOUR BEFORE BREAKFAST FOR STOMACH ACID.   Polyethyl Glycol-Propyl Glycol (SYSTANE) 0.4-0.3 % GEL ophthalmic gel Place 1 application into both eyes daily.   TRIAMCINOLONE ACETONIDE,NASAL, NA Place 2 sprays into the nose daily. Each nostril   acetaminophen  (TYLENOL ) 500 MG tablet Take 500 mg by mouth daily.  (Patient not taking: Reported on 11/22/2024)   aspirin  EC 81 MG tablet Take 1 tablet (81 mg total) by mouth daily. (Patient not taking: Reported on 11/22/2024)   Calcium  Carb-Cholecalciferol 600-20 MG-MCG TABS Take 1 tablet by mouth 2 (two) times daily. (Patient not taking: Reported on 11/22/2024)   ferrous sulfate  325 (65 FE) MG EC tablet Take 325 mg by mouth 3 (three) times a week. Monday, Wednesday, and Friday. (Patient not taking: Reported on 11/22/2024)   loperamide (IMODIUM A-D) 2 MG tablet Take 2 mg by mouth every 6 (six) hours as needed for diarrhea or loose stools. (Patient not taking: Reported on 11/22/2024)   Multiple Vitamins-Minerals (OCUVITE ADULT 50+) CAPS Take 1 capsule by mouth every evening. (Patient not taking: Reported on 11/22/2024)   No facility-administered encounter medications on file as of 11/22/2024.    Review of Systems  Constitutional:  Negative for appetite change, fatigue and fever.  HENT:  Negative for congestion, hearing loss, rhinorrhea and trouble swallowing.   Eyes:  Negative for visual disturbance.  Respiratory:  Negative for cough and shortness of breath.   Cardiovascular:  Negative for leg swelling.  Gastrointestinal:  Negative for abdominal pain and constipation.  Genitourinary:  Negative for dysuria and urgency.       Worsened from occasionally urinary leakage to frequent urinary incontinence even if more at night.   Musculoskeletal:  Positive for arthralgias, back pain and gait problem. Negative for neck pain.  Skin:  Negative  for color change.  Neurological:  Negative for speech difficulty, weakness and light-headedness.       Tingling in toes sometimes, better after moves around.  Tingling and numbness in the right shin 04/06/24 resolved after rest and elevated  leg.   Psychiatric/Behavioral:  Negative for behavioral problems and sleep disturbance. The patient is not nervous/anxious.        Intermittent     Immunization History  Administered Date(s) Administered   Fluad Quad(high Dose 65+) 10/12/2019, 10/14/2022   INFLUENZA, HIGH DOSE SEASONAL PF 10/07/2017, 09/28/2021, 10/04/2024   Influenza Whole 09/30/2018   Influenza-Unspecified 09/27/2015, 10/09/2016, 10/10/2020, 09/30/2023   Moderna Covid-19 Fall Seasonal Vaccine 18yrs & older 10/14/2023   Moderna Covid-19 Vaccine Bivalent Booster 35yrs & up 10/30/2022   Moderna SARS-COV2 Booster Vaccination 10/14/2023   Moderna Sars-Covid-2 Vaccination 01/02/2020, 01/30/2020, 11/06/2020, 05/28/2021, 05/16/2022   PFIZER(Purple Top)SARS-COV-2 Vaccination 09/17/2021   Pfizer Fall 2024 Covid-19 Vaccine 6mos thru 63yrs 05/16/2022   Pneumococcal Conjugate-13 01/23/2015   Pneumococcal Polysaccharide-23 10/20/2007   Respiratory Syncytial Virus Vaccine,Recomb Aduvanted(Arexvy) 12/26/2022   Td 12/14/2012   Tdap 11/05/2023   Unspecified SARS-COV-2 Vaccination 10/24/2024   Pertinent  Health Maintenance Due  Topic Date Due   Influenza Vaccine  Completed   Bone Density Scan  Completed      03/05/2023    2:48 PM 03/12/2023    3:32 PM 04/23/2023    1:49 PM 05/20/2023    2:12 PM 08/14/2023   12:41 PM  Fall Risk  Falls in the past year? 0 0 0 0 0  Was there an injury with Fall? 0 0 0 0 0  Fall Risk Category Calculator 0 0 0 0 0  Patient at Risk for Falls Due to No Fall Risks Impaired balance/gait;Impaired mobility History of fall(s) History of fall(s)   Fall risk Follow up Falls evaluation completed Falls evaluation completed Falls evaluation completed Falls evaluation completed  Falls evaluation completed   Functional Status Survey:    Vitals:   11/22/24 1329 11/22/24 1410  BP: (!) 168/68 (!) 158/90  Pulse: (!) 58   Resp: 18   Temp: 97.8 F (36.6 C)   SpO2: 97%   Weight: 146 lb 11.2 oz (66.5 kg)   Height: 5' 7 (1.702 m)    Body mass index is 22.98 kg/m. Physical Exam Vitals and nursing note reviewed.  Constitutional:      Appearance: Normal appearance.  HENT:     Head: Normocephalic and atraumatic.     Mouth/Throat:     Mouth: Mucous membranes are moist.  Eyes:     Extraocular Movements: Extraocular movements intact.     Conjunctiva/sclera: Conjunctivae normal.     Pupils: Pupils are equal, round, and reactive to light.  Cardiovascular:     Rate and Rhythm: Normal rate and regular rhythm.     Heart sounds: No murmur heard. Pulmonary:     Effort: Pulmonary effort is normal.     Breath sounds: Normal breath sounds. No rales.  Abdominal:     General: Bowel sounds are normal.     Palpations: Abdomen is soft.     Tenderness: There is no abdominal tenderness.  Musculoskeletal:        General: No tenderness.     Cervical back: Normal range of motion and neck supple.     Right lower leg: No edema.     Left lower leg: No edema.     Comments: Chronic neck,  middle back, and lower back pain.  No spinal spinous process tenderness palpated  Skin:    General: Skin is warm and dry.  Neurological:     General: No focal deficit present.     Mental Status: She is alert and oriented to person, place, and  time. Mental status is at baseline.     Gait: Gait abnormal.  Psychiatric:        Mood and Affect: Mood normal.        Behavior: Behavior normal.        Thought Content: Thought content normal.     Labs reviewed: Recent Labs    08/04/24 0000 09/07/24 0000 09/24/24 0000  NA 138 136* 144  K 3.8 3.9 3.5  CL 106 104 109*  CO2 29* 25* 28*  BUN 15 13 22*  CREATININE 0.8 0.7 0.8  CALCIUM  9.4 9.0 9.0   Recent Labs    03/29/24 0000  04/07/24 0000 07/09/24 0848 08/04/24 0000  AST 19 21 23 18   ALT 13 15 14 12   ALKPHOS 73 83 72 66  ALBUMIN 4.1  --  4.4 3.8   Recent Labs    06/30/24 0000 07/09/24 0848 08/04/24 0000 09/07/24 0000 09/24/24 0000  WBC 7.5 6.8 6.0 6.4 5.5  NEUTROABS 4,508.00 4,162.00 3,390.00  --   --   HGB 11.9* 11.8* 11.4* 11.1* 10.8*  HCT 37 36 36 34* 34*  PLT 212 193 157 157 208   Lab Results  Component Value Date   TSH 3.49 08/11/2023   Lab Results  Component Value Date   HGBA1C 5.8 08/11/2023   Lab Results  Component Value Date   CHOL 139 06/11/2022   HDL 56 06/11/2022   LDLCALC 70 06/11/2022   TRIG 57 06/11/2022   CHOLHDL 2.5 06/11/2022    Significant Diagnostic Results in last 30 days:  No results found.  Assessment/Plan HTN (hypertension) Permissive Bp control, taking Losartan , Metoprolol ,  off ASA due to simplify meds, Bun/creat 22/0.82 09/24/2024  GERD (gastroesophageal reflux disease) stable on Omeprazole   Anxiety Improved since admitted memory care unit Lorazepam  prn available to her.  Allergic to as SSRI, failed Cymbalta , BuSpar. The patient's son stated barbiturates worked for him.   Gait abnormality ambulated with walker, risk of falling     Family/ staff Communication: Plan of care reviewed with the patient and charge nurse  Labs/tests ordered: None

## 2024-11-22 NOTE — Assessment & Plan Note (Signed)
 Improved since admitted memory care unit Lorazepam  prn available to her.  Allergic to as SSRI, failed Cymbalta , BuSpar. The patient's son stated barbiturates worked for him.

## 2024-11-30 ENCOUNTER — Other Ambulatory Visit: Payer: Self-pay | Admitting: Adult Health

## 2024-11-30 DIAGNOSIS — F411 Generalized anxiety disorder: Secondary | ICD-10-CM

## 2024-11-30 MED ORDER — LORAZEPAM 0.5 MG PO TABS: 0.5000 mg | ORAL_TABLET | Freq: Three times a day (TID) | ORAL | 0 refills | Status: DC | PRN

## 2024-12-12 ENCOUNTER — Other Ambulatory Visit: Payer: Self-pay | Admitting: Nurse Practitioner

## 2024-12-12 DIAGNOSIS — F411 Generalized anxiety disorder: Secondary | ICD-10-CM

## 2024-12-12 MED ORDER — LORAZEPAM 0.5 MG PO TABS: 0.5000 mg | ORAL_TABLET | Freq: Three times a day (TID) | ORAL | 0 refills | Status: AC | PRN

## 2024-12-23 ENCOUNTER — Encounter: Payer: Self-pay | Admitting: Nurse Practitioner

## 2024-12-23 ENCOUNTER — Non-Acute Institutional Stay (SKILLED_NURSING_FACILITY): Payer: Self-pay | Admitting: Nurse Practitioner

## 2024-12-23 DIAGNOSIS — F419 Anxiety disorder, unspecified: Secondary | ICD-10-CM

## 2024-12-23 DIAGNOSIS — F5105 Insomnia due to other mental disorder: Secondary | ICD-10-CM

## 2024-12-23 DIAGNOSIS — I10 Essential (primary) hypertension: Secondary | ICD-10-CM | POA: Diagnosis not present

## 2024-12-23 DIAGNOSIS — R4189 Other symptoms and signs involving cognitive functions and awareness: Secondary | ICD-10-CM

## 2024-12-23 DIAGNOSIS — R269 Unspecified abnormalities of gait and mobility: Secondary | ICD-10-CM | POA: Diagnosis not present

## 2024-12-23 NOTE — Progress Notes (Signed)
 " Location:   [facility] Room Number: 102 Place of Service:  SNF (31) Provider: Larwance Avey Mcmanamon NP  Ammarie Matsuura X, NP  Patient Care Team: Haydyn Girvan X, NP as PCP - General (Internal Medicine) Rucker Pridgeon X, NP as Nurse Practitioner (Internal Medicine)  Extended Emergency Contact Information Primary Emergency Contact: [facility] Address: [address]          [place], [place] [address] [place] Home Phone: [phone] Mobile Phone: [phone] Relation: Son  Code Status:  DNR Goals of care: Advanced Directive information    11/22/2024    1:42 PM  Advanced Directives  Does Patient Have a Medical Advance Directive? Yes  Type of Estate Agent of [place];Living will;Out of facility DNR (pink MOST or yellow form)  Does patient want to make changes to medical advance directive? No - Patient declined  Copy of Healthcare Power of Attorney in Chart? Yes - validated most recent copy scanned in chart (See row information)  Pre-existing out of facility DNR order (yellow form or pink MOST form) Pink MOST form placed in chart (order not valid for inpatient use);Yellow form placed in chart (order not valid for inpatient use)     Chief Complaint  Patient presents with   Medical Management of Chronic Issues    HPI:  Pt is a 89 y.o. female seen today for medical management of chronic diseases.    Dementia, failed BuSpar, duloxetine , Lexapro, Mirtazapine.  Adjusted to Memory care unit well.              Gait abnormality, ambulated with walker, risk of falling             Hx of near syncope: not new, no orthostatic hypotension, last episode of dizziness 12/10/23. 09/02/24 CT head: Confluent chronic ischemic white matter changes.              Edema BLE, not apparent, TED             OSA, CPAP, seeing pulmonology.              A-flutter 01/05/22, no recurrence, delay cardiology consultation OP declined Alendronate  after initial consented.  Taking calcium  and  vitamin D             Bone spur R foot, callus of foot, f/u podiatrist.              Allergic rhinitis, on nasal spray             Anxiety, Lorazepam  prn available to her.  Allergic to as SSRI, failed Cymbalta , BuSpar, Mirtazapine. The patient's son stated barbiturates worked for him.              Elevated TSH 5.07 06/11/22, TSH 5.11 02/10/23, TSH 3.49 08/11/23,  not on supplement.             HTN, taking Losartan , Metoprolol ,  off ASA due to simplify meds, Bun/creat 22/0.82 09/24/2024             GERD, stable on Omeprazole              Hyperlipidemia, off Atorvastatin , LDL 70 06/11/22             Prediabetes, diet controlled, Hgb a1c 5.8 08/11/23             Anemia, Iron 37 06/30/24, off Fe-simplify meds, Hgb 10.8 09/24/2024   Past Medical History:  Diagnosis Date   GERD (gastroesophageal reflux disease)  Hx of cataract surgery    both eyes   Hyperglycemia    Hyperlipidemia    Hypertension    Lumbago 04/05/2016   Macular degeneration    ? which eye(s)   Obstructive sleep apnea    Osteoporosis, senile    Overactive bladder    Squamous cell skin cancer 2/16   left lower leg   Past Surgical History:  Procedure Laterality Date   ABDOMINAL HYSTERECTOMY  1960's   APPENDECTOMY  1960's   MOLE REMOVAL  2000   facial   TONSILLECTOMY      Allergies[1]  Allergies as of 12/23/2024       Reactions   Adhesive [tape]    Ampicillin    Elemental Sulfur    Nitrofuran Derivatives    Zoloft  [sertraline  Hcl] Other (See Comments)   Dizziness, nausea, faint feeling, and nightmares          Medication List        Accurate as of December 23, 2024 11:59 PM. If you have any questions, ask your nurse or doctor.          acetaminophen  325 MG tablet Commonly known as: TYLENOL  Take 650 mg by mouth every 4 (four) hours as needed. 2 tablets by mouth every 4 hours as need for fever and pain   LORazepam  0.5 MG tablet Commonly known as: ATIVAN  Take 1 tablet (0.5 mg total) by  mouth every 8 (eight) hours as needed for anxiety. TAKE ONE TABLET ONCE DAILY AS NEEDED FOR ANXIETY.   losartan  100 MG tablet Commonly known as: COZAAR  Take 100 mg by mouth daily.   metoprolol  succinate 25 MG 24 hr tablet Commonly known as: TOPROL -XL TAKE 1/2 TABLET ONCE A DAY.   omeprazole  40 MG capsule Commonly known as: PRILOSEC TAKE 1 CAPSULE DAILY 1/2 HOUR BEFORE BREAKFAST FOR STOMACH ACID.   Systane 0.4-0.3 % Gel ophthalmic gel Generic drug: Polyethyl Glycol-Propyl Glycol Place 1 application into both eyes daily.   TRIAMCINOLONE ACETONIDE(NASAL) NA Place 2 sprays into the nose daily. Each nostril        Review of Systems  Constitutional:  Negative for appetite change, fatigue and fever.  HENT:  Negative for congestion, hearing loss, rhinorrhea and trouble swallowing.   Eyes:  Negative for visual disturbance.  Respiratory:  Negative for cough and shortness of breath.   Cardiovascular:  Negative for leg swelling.  Gastrointestinal:  Negative for abdominal pain and constipation.  Genitourinary:  Negative for dysuria and urgency.       Worsened from occasionally urinary leakage to frequent urinary incontinence even if more at night.   Musculoskeletal:  Positive for arthralgias, back pain and gait problem. Negative for neck pain.  Skin:  Negative for color change.  Neurological:  Negative for speech difficulty, weakness and light-headedness.       Tingling in toes sometimes, better after moves around.  Tingling and numbness in the right shin 04/06/24 resolved after rest and elevated leg.   Psychiatric/Behavioral:  Negative for behavioral problems and sleep disturbance. The patient is not nervous/anxious.        Intermittent     Immunization History  Administered Date(s) Administered   Fluad Quad(high Dose 65+) 10/12/2019, 10/14/2022   INFLUENZA, HIGH DOSE SEASONAL PF 10/07/2017, 09/28/2021, 10/04/2024   Influenza Whole 09/30/2018   Influenza-Unspecified 09/27/2015,  10/09/2016, 10/10/2020, 09/30/2023   Moderna Covid-19 Fall Seasonal Vaccine 82yrs & older 10/14/2023   Moderna Covid-19 Vaccine Bivalent Booster 43yrs & up 10/30/2022   Moderna SARS-COV2 Booster  Vaccination 10/14/2023   Moderna Sars-Covid-2 Vaccination 01/02/2020, 01/30/2020, 11/06/2020, 05/28/2021, 05/16/2022   PFIZER(Purple Top)SARS-COV-2 Vaccination 09/17/2021   Pfizer-BioNTech Covid-19 vaccine 6mos thru 56yrs 05/16/2022   Pneumococcal Conjugate-13 01/23/2015   Pneumococcal Polysaccharide-23 10/20/2007   Respiratory Syncytial Virus Vaccine,Recomb Aduvanted(Arexvy) 12/26/2022   Td 12/14/2012   Tdap 11/05/2023   Unspecified SARS-COV-2 Vaccination 10/24/2024   Pertinent  Health Maintenance Due  Topic Date Due   Influenza Vaccine  Completed   Bone Density Scan  Completed      03/05/2023    2:48 PM 03/12/2023    3:32 PM 04/23/2023    1:49 PM 05/20/2023    2:12 PM 08/14/2023   12:41 PM  Fall Risk  Falls in the past year? 0 0 0 0 0  Was there an injury with Fall? 0  0  0  0  0   Fall Risk Category Calculator 0 0 0 0 0  Patient at Risk for Falls Due to No Fall Risks Impaired balance/gait;Impaired mobility History of fall(s) History of fall(s)   Fall risk Follow up Falls evaluation completed Falls evaluation completed Falls evaluation completed Falls evaluation completed Falls evaluation completed     Data saved with a previous flowsheet row definition   Functional Status Survey:    Vitals:   12/23/24 1303 12/26/24 1147  BP: (!) 168/72 (!) 168/74  Pulse: 62   Resp: 17   Temp: (!) 97 F (36.1 C)   SpO2: 98%    There is no height or weight on file to calculate BMI. Physical Exam Vitals and nursing note reviewed.  Constitutional:      Appearance: Normal appearance.  HENT:     Head: Normocephalic and atraumatic.     Mouth/Throat:     Mouth: Mucous membranes are moist.  Eyes:     Extraocular Movements: Extraocular movements intact.     Conjunctiva/sclera:  Conjunctivae normal.     Pupils: Pupils are equal, round, and reactive to light.  Cardiovascular:     Rate and Rhythm: Normal rate and regular rhythm.     Heart sounds: No murmur heard. Pulmonary:     Effort: Pulmonary effort is normal.     Breath sounds: Normal breath sounds. No rales.  Abdominal:     General: Bowel sounds are normal.     Palpations: Abdomen is soft.     Tenderness: There is no abdominal tenderness.  Musculoskeletal:        General: No tenderness.     Cervical back: Normal range of motion and neck supple.     Right lower leg: No edema.     Left lower leg: No edema.     Comments: Chronic neck,  middle back, and lower back pain.  No spinal spinous process tenderness palpated  Skin:    General: Skin is warm and dry.  Neurological:     General: No focal deficit present.     Mental Status: She is alert and oriented to person, place, and time. Mental status is at baseline.     Gait: Gait abnormal.  Psychiatric:        Mood and Affect: Mood normal.        Behavior: Behavior normal.        Thought Content: Thought content normal.     Labs reviewed: Recent Labs    08/04/24 0000 09/07/24 0000 09/24/24 0000  NA 138 136* 144  K 3.8 3.9 3.5  CL 106 104 109*  CO2 29* 25* 28*  BUN  15 13 22*  CREATININE 0.8 0.7 0.8  CALCIUM  9.4 9.0 9.0   Recent Labs    03/29/24 0000 04/07/24 0000 07/09/24 0848 08/04/24 0000  AST 19 21 23 18   ALT 13 15 14 12   ALKPHOS 73 83 72 66  ALBUMIN 4.1  --  4.4 3.8   Recent Labs    06/30/24 0000 07/09/24 0848 08/04/24 0000 09/07/24 0000 09/24/24 0000  WBC 7.5 6.8 6.0 6.4 5.5  NEUTROABS 4,508.00 4,162.00 3,390.00  --   --   HGB 11.9* 11.8* 11.4* 11.1* 10.8*  HCT 37 36 36 34* 34*  PLT 212 193 157 157 208   Lab Results  Component Value Date   TSH 3.49 08/11/2023   Lab Results  Component Value Date   HGBA1C 5.8 08/11/2023   Lab Results  Component Value Date   CHOL 139 06/11/2022   HDL 56 06/11/2022   LDLCALC 70  06/11/2022   TRIG 57 06/11/2022   CHOLHDL 2.5 06/11/2022    Significant Diagnostic Results in last 30 days:  No results found.  Assessment/Plan  Insomnia secondary to anxiety  Lorazepam  prn available to her.  Allergic to as SSRI, failed Cymbalta , BuSpar, Mirtazapine. The patient's son stated barbiturates worked for him.   Cognitive impairment Adjusted to memory care unit.   Gait abnormality Gait abnormality, ambulated with walker, risk of falling  HTN (hypertension) Permissive Bp control, Dbp in 70s, noted large pulse cap  taking Losartan , Metoprolol ,  off ASA due to simplify meds, Bun/creat 22/0.82 09/24/2024  GERD (gastroesophageal reflux disease) Stable, on Omeprazole    Family/ staff Communication: plan of care reviewed with the patient and charge nurse.   Labs/tests ordered:  none        [1] Allergies Allergen Reactions   Adhesive [Tape]    Ampicillin    Elemental Sulfur    Nitrofuran Derivatives    Zoloft  [Sertraline  Hcl] Other (See Comments)    Dizziness, nausea, faint feeling, and nightmares    "

## 2024-12-23 NOTE — Assessment & Plan Note (Signed)
 Gait abnormality, ambulated with walker, risk of falling

## 2024-12-23 NOTE — Assessment & Plan Note (Signed)
 Adjusted to memory care unit.

## 2024-12-23 NOTE — Assessment & Plan Note (Signed)
"   Lorazepam  prn available to her.  Allergic to as SSRI, failed Cymbalta , BuSpar, Mirtazapine. The patient's son stated barbiturates worked for him.  "

## 2024-12-23 NOTE — Assessment & Plan Note (Addendum)
 Permissive Bp control, Dbp in 70s, noted large pulse cap  taking Losartan , Metoprolol ,  off ASA due to simplify meds, Bun/creat 22/0.82 09/24/2024

## 2024-12-23 NOTE — Assessment & Plan Note (Signed)
Stable, on Omeprazole
